# Patient Record
Sex: Male | Born: 1940
Health system: Southern US, Community
[De-identification: ages and names within clinical notes are randomized; demographics above are authoritative.]

## PROBLEM LIST (undated history)

## (undated) DIAGNOSIS — E785 Hyperlipidemia, unspecified: Secondary | ICD-10-CM

## (undated) DIAGNOSIS — K5790 Diverticulosis of intestine, part unspecified, without perforation or abscess without bleeding: Secondary | ICD-10-CM

## (undated) DIAGNOSIS — I10 Essential (primary) hypertension: Secondary | ICD-10-CM

## (undated) DIAGNOSIS — I469 Cardiac arrest, cause unspecified: Secondary | ICD-10-CM

## (undated) DIAGNOSIS — I5021 Acute systolic (congestive) heart failure: Secondary | ICD-10-CM

## (undated) DIAGNOSIS — I214 Non-ST elevation (NSTEMI) myocardial infarction: Secondary | ICD-10-CM

## (undated) DIAGNOSIS — N400 Enlarged prostate without lower urinary tract symptoms: Secondary | ICD-10-CM

## (undated) DIAGNOSIS — F419 Anxiety disorder, unspecified: Secondary | ICD-10-CM

## (undated) DIAGNOSIS — D126 Benign neoplasm of colon, unspecified: Secondary | ICD-10-CM

## (undated) HISTORY — PX: CHOLECYSTECTOMY: SHX55

---

## 2005-10-01 ENCOUNTER — Inpatient Hospital Stay: Payer: Self-pay | Admitting: Internal Medicine

## 2005-10-01 ENCOUNTER — Other Ambulatory Visit: Payer: Self-pay

## 2005-10-05 ENCOUNTER — Inpatient Hospital Stay: Payer: Self-pay | Admitting: Unknown Physician Specialty

## 2005-10-05 ENCOUNTER — Other Ambulatory Visit: Payer: Self-pay

## 2006-10-07 ENCOUNTER — Inpatient Hospital Stay: Payer: Self-pay | Admitting: Internal Medicine

## 2006-10-07 ENCOUNTER — Other Ambulatory Visit: Payer: Self-pay

## 2010-05-05 ENCOUNTER — Ambulatory Visit: Payer: Self-pay | Admitting: Family Medicine

## 2010-05-11 ENCOUNTER — Ambulatory Visit: Payer: Self-pay | Admitting: Unknown Physician Specialty

## 2010-05-23 ENCOUNTER — Ambulatory Visit: Payer: Self-pay | Admitting: Unknown Physician Specialty

## 2010-05-24 ENCOUNTER — Ambulatory Visit: Payer: Self-pay | Admitting: Unknown Physician Specialty

## 2010-05-31 ENCOUNTER — Ambulatory Visit: Payer: Self-pay | Admitting: Internal Medicine

## 2010-05-31 ENCOUNTER — Ambulatory Visit: Payer: Self-pay | Admitting: Unknown Physician Specialty

## 2010-06-29 ENCOUNTER — Ambulatory Visit: Payer: Self-pay | Admitting: Internal Medicine

## 2010-06-30 ENCOUNTER — Ambulatory Visit: Payer: Self-pay | Admitting: Unknown Physician Specialty

## 2010-07-10 ENCOUNTER — Ambulatory Visit: Payer: Self-pay | Admitting: Unknown Physician Specialty

## 2010-07-30 ENCOUNTER — Ambulatory Visit: Payer: Self-pay | Admitting: Unknown Physician Specialty

## 2010-08-29 ENCOUNTER — Ambulatory Visit: Payer: Self-pay | Admitting: Unknown Physician Specialty

## 2010-09-20 ENCOUNTER — Ambulatory Visit: Payer: Self-pay | Admitting: Internal Medicine

## 2010-09-21 ENCOUNTER — Ambulatory Visit: Payer: Self-pay | Admitting: Internal Medicine

## 2010-09-22 LAB — PSA

## 2010-09-29 ENCOUNTER — Ambulatory Visit: Payer: Self-pay | Admitting: Internal Medicine

## 2010-12-04 ENCOUNTER — Ambulatory Visit: Payer: Self-pay | Admitting: Unknown Physician Specialty

## 2010-12-06 ENCOUNTER — Inpatient Hospital Stay: Payer: Self-pay | Admitting: Internal Medicine

## 2010-12-13 LAB — PATHOLOGY REPORT

## 2015-02-16 ENCOUNTER — Observation Stay
Admission: EM | Admit: 2015-02-16 | Discharge: 2015-02-18 | Disposition: A | Discharge status: Home / Self Care | Payer: PPO | Attending: Internal Medicine | Admitting: Internal Medicine

## 2015-02-16 ENCOUNTER — Emergency Department: Payer: PPO

## 2015-02-16 DIAGNOSIS — Z79899 Other long term (current) drug therapy: Secondary | ICD-10-CM | POA: Diagnosis not present

## 2015-02-16 DIAGNOSIS — R112 Nausea with vomiting, unspecified: Secondary | ICD-10-CM | POA: Diagnosis not present

## 2015-02-16 DIAGNOSIS — H5509 Other forms of nystagmus: Secondary | ICD-10-CM | POA: Insufficient documentation

## 2015-02-16 DIAGNOSIS — Z91048 Other nonmedicinal substance allergy status: Secondary | ICD-10-CM | POA: Insufficient documentation

## 2015-02-16 DIAGNOSIS — E876 Hypokalemia: Secondary | ICD-10-CM | POA: Diagnosis not present

## 2015-02-16 DIAGNOSIS — R42 Dizziness and giddiness: Secondary | ICD-10-CM

## 2015-02-16 DIAGNOSIS — Z9049 Acquired absence of other specified parts of digestive tract: Secondary | ICD-10-CM | POA: Diagnosis not present

## 2015-02-16 DIAGNOSIS — Z8249 Family history of ischemic heart disease and other diseases of the circulatory system: Secondary | ICD-10-CM | POA: Diagnosis not present

## 2015-02-16 DIAGNOSIS — I10 Essential (primary) hypertension: Principal | ICD-10-CM | POA: Insufficient documentation

## 2015-02-16 DIAGNOSIS — H8110 Benign paroxysmal vertigo, unspecified ear: Secondary | ICD-10-CM | POA: Diagnosis present

## 2015-02-16 DIAGNOSIS — I160 Hypertensive urgency: Secondary | ICD-10-CM | POA: Diagnosis present

## 2015-02-16 DIAGNOSIS — E785 Hyperlipidemia, unspecified: Secondary | ICD-10-CM | POA: Diagnosis not present

## 2015-02-16 DIAGNOSIS — F419 Anxiety disorder, unspecified: Secondary | ICD-10-CM | POA: Insufficient documentation

## 2015-02-16 HISTORY — DX: Anxiety disorder, unspecified: F41.9

## 2015-02-16 HISTORY — DX: Hyperlipidemia, unspecified: E78.5

## 2015-02-16 HISTORY — DX: Essential (primary) hypertension: I10

## 2015-02-16 LAB — BASIC METABOLIC PANEL
Anion gap: 12 (ref 5–15)
BUN: 21 mg/dL — AB (ref 6–20)
CHLORIDE: 100 mmol/L — AB (ref 101–111)
CO2: 23 mmol/L (ref 22–32)
CREATININE: 1.23 mg/dL (ref 0.61–1.24)
Calcium: 9.5 mg/dL (ref 8.9–10.3)
GFR calc Af Amer: >60 mL/min (ref >60)
GFR calc non Af Amer: 57 mL/min — ABNORMAL LOW (ref >60)
Glucose, Bld: 224 mg/dL — ABNORMAL HIGH (ref 65–99)
Potassium: 4.8 mmol/L (ref 3.5–5.1)
Sodium: 135 mmol/L (ref 135–145)

## 2015-02-16 LAB — CBC
HCT: 49.9 % (ref 40.0–52.0)
Hemoglobin: 16.6 g/dL (ref 13.0–18.0)
MCH: 30.4 pg (ref 26.0–34.0)
MCHC: 33.3 g/dL (ref 32.0–36.0)
MCV: 91.2 fL (ref 80.0–100.0)
PLATELETS: 158 10*3/uL (ref 150–440)
RBC: 5.47 MIL/uL (ref 4.40–5.90)
RDW: 13.1 % (ref 11.5–14.5)
WBC: 11.7 10*3/uL — ABNORMAL HIGH (ref 3.8–10.6)

## 2015-02-16 LAB — URINALYSIS COMPLETE WITH MICROSCOPIC (ARMC ONLY)
BILIRUBIN URINE: NEGATIVE
GLUCOSE, UA: 150 mg/dL — AB
Hgb urine dipstick: NEGATIVE
Leukocytes, UA: NEGATIVE
Nitrite: NEGATIVE
PH: 5 (ref 5.0–8.0)
Protein, ur: NEGATIVE mg/dL
SQUAMOUS EPITHELIAL / LPF: NONE SEEN
Specific Gravity, Urine: 1.018 (ref 1.005–1.030)

## 2015-02-16 MED ORDER — MECLIZINE HCL 25 MG PO TABS
25.0000 mg | ORAL_TABLET | Freq: Once | ORAL | Status: AC
  Administered 2015-02-16: 25 mg via ORAL
  Filled 2015-02-16: qty 1

## 2015-02-16 MED ORDER — DIAZEPAM 5 MG/ML IJ SOLN: 5.0000 mg | Freq: Once | INTRAMUSCULAR | Status: DC

## 2015-02-16 MED ORDER — SODIUM CHLORIDE 0.9 % IV SOLN
INTRAVENOUS | Status: DC
  Administered 2015-02-16 – 2015-02-17 (×2): via INTRAVENOUS

## 2015-02-16 MED ORDER — PROMETHAZINE HCL 25 MG/ML IJ SOLN
12.5000 mg | Freq: Once | INTRAMUSCULAR | Status: AC
  Administered 2015-02-16: 12.5 mg via INTRAVENOUS

## 2015-02-16 MED ORDER — ACETAMINOPHEN 325 MG PO TABS: 650.0000 mg | ORAL_TABLET | Freq: Four times a day (QID) | ORAL | Status: DC | PRN

## 2015-02-16 MED ORDER — PROMETHAZINE HCL 25 MG/ML IJ SOLN
INTRAMUSCULAR | Status: AC
  Filled 2015-02-16: qty 1

## 2015-02-16 MED ORDER — IMIPRAMINE HCL 25 MG PO TABS
50.0000 mg | ORAL_TABLET | Freq: Every day | ORAL | Status: DC
  Administered 2015-02-17: 50 mg via ORAL
  Filled 2015-02-16: qty 2

## 2015-02-16 MED ORDER — ONDANSETRON HCL 4 MG PO TABS: 4.0000 mg | ORAL_TABLET | Freq: Four times a day (QID) | ORAL | Status: DC | PRN

## 2015-02-16 MED ORDER — SODIUM CHLORIDE 0.9 % IJ SOLN
3.0000 mL | Freq: Two times a day (BID) | INTRAMUSCULAR | Status: DC
  Administered 2015-02-16 – 2015-02-17 (×2): 3 mL via INTRAVENOUS

## 2015-02-16 MED ORDER — ONDANSETRON HCL 4 MG/2ML IJ SOLN: 4.0000 mg | Freq: Four times a day (QID) | INTRAMUSCULAR | Status: DC | PRN

## 2015-02-16 MED ORDER — MORPHINE SULFATE (PF) 2 MG/ML IV SOLN: 2.0000 mg | INTRAVENOUS | Status: DC | PRN

## 2015-02-16 MED ORDER — HEPARIN SODIUM (PORCINE) 5000 UNIT/ML IJ SOLN
5000.0000 [IU] | Freq: Three times a day (TID) | INTRAMUSCULAR | Status: DC
  Administered 2015-02-16 – 2015-02-18 (×5): 5000 [IU] via SUBCUTANEOUS
  Filled 2015-02-16 (×4): qty 1

## 2015-02-16 MED ORDER — DIAZEPAM 5 MG/ML IJ SOLN
2.5000 mg | Freq: Once | INTRAMUSCULAR | Status: AC
  Administered 2015-02-16: 2.5 mg via INTRAVENOUS
  Filled 2015-02-16: qty 2

## 2015-02-16 MED ORDER — OXYCODONE HCL 5 MG PO TABS: 5.0000 mg | ORAL_TABLET | ORAL | Status: DC | PRN

## 2015-02-16 MED ORDER — ONDANSETRON HCL 4 MG/2ML IJ SOLN
4.0000 mg | Freq: Once | INTRAMUSCULAR | Status: AC
  Administered 2015-02-16: 4 mg via INTRAVENOUS
  Filled 2015-02-16: qty 2

## 2015-02-16 MED ORDER — PRAVASTATIN SODIUM 20 MG PO TABS
20.0000 mg | ORAL_TABLET | Freq: Every day | ORAL | Status: DC
  Administered 2015-02-17: 20 mg via ORAL
  Filled 2015-02-16: qty 1

## 2015-02-16 MED ORDER — HYDRALAZINE HCL 20 MG/ML IJ SOLN
10.0000 mg | INTRAMUSCULAR | Status: DC | PRN
  Administered 2015-02-17: 10 mg via INTRAVENOUS
  Filled 2015-02-16: qty 1

## 2015-02-16 MED ORDER — ACETAMINOPHEN 650 MG RE SUPP: 650.0000 mg | Freq: Four times a day (QID) | RECTAL | Status: DC | PRN

## 2015-02-16 MED ORDER — CARVEDILOL 12.5 MG PO TABS
25.0000 mg | ORAL_TABLET | Freq: Two times a day (BID) | ORAL | Status: DC
  Administered 2015-02-17 – 2015-02-18 (×3): 25 mg via ORAL
  Filled 2015-02-16 (×3): qty 2

## 2015-02-16 NOTE — ED Provider Notes (Addendum)
[facility] Emergency Department Provider Note  ____________________________________________   I have reviewed the triage vital signs and the nursing notes.   HISTORY  Chief Complaint Emesis    HPI Marc [name] is a 74 y.o. male presents today complaining of vertigo symptoms. He has not had vertigo before. Started yesterday, gradually, and got worse today. A true spinning sensation associated with vomiting. He has had no fever no chills no headache no recent URI. He has no focal neurologic deficit no difficulty talking walking or seeing. He states that he has no headache. He didn't fall he did not hit his head. He believes he did take his blood pressure medication today. He states his blood pressures always up when he is around doctors. He states that he has not had this before no history of CVA but he does have a history of hypertension and high cholesterol.  Past Medical History  Diagnosis Date  . Hypertension   . Hyperlipidemia   . Anxiety     There are no active problems to display for this patient.   Past Surgical History  Procedure Laterality Date  . Cholecystectomy      No current outpatient prescriptions on file.  Allergies Review of patient's allergies indicates not on file.  No family history on file.  Social History Social History  Substance Use Topics  . Smoking status: Never Smoker   . Smokeless tobacco: None  . Alcohol Use: No    Review of Systems Constitutional: No fever/chills Eyes: No visual changes. ENT: No sore throat. No stiff neck no neck pain Cardiovascular: Denies chest pain. Respiratory: Denies shortness of breath. Gastrointestinal:   Positive vomiting.  No diarrhea.  No constipation. Genitourinary: Negative for dysuria. Musculoskeletal: Negative lower extremity swelling Skin: Negative for rash. Neurological: Negative for headaches, focal weakness or numbness. 10-point ROS otherwise  negative.  ____________________________________________   PHYSICAL EXAM:  VITAL SIGNS: ED Triage Vitals  Enc Vitals Group     BP 02/16/15 1518 220/111 mmHg     Pulse Rate 02/16/15 1518 58     Resp 02/16/15 1518 21     Temp 02/16/15 1518 97.4 F (36.3 C)     Temp src --      SpO2 02/16/15 1518 100 %     Weight 02/16/15 1518 160 lb (72.576 kg)     Height 02/16/15 1518 5\' 7"  (1.702 m)     Head Cir --      Peak Flow --      Pain Score 02/16/15 1520 6     Pain Loc --      Pain Edu? --      Excl. in [place]? --     Constitutional: Alert and oriented. Well appearing and in no acute distress. Eyes: Conjunctivae are normal. PERRL. EOMI. Head: Atraumatic. Nose: No congestion/rhinnorhea. Mouth/Throat: Mucous membranes are moist.  Oropharynx non-erythematous. TMs partially occluded by cerumen no focal lesions Neck: No stridor.   Nontender with no meningismus Cardiovascular: Normal rate, regular rhythm. Grossly normal heart sounds.  Good peripheral circulation. Respiratory: Normal respiratory effort.  No retractions. Lungs CTAB. Gastrointestinal: Soft and nontender. No distention. No guarding no rebound Back:  There is no focal tenderness or step off there is no midline tenderness there are no lesions noted. there is no CVA tenderness Musculoskeletal: No lower extremity tenderness. No joint effusions, no DVT signs strong distal pulses no edema Cranial nerves II through XII are grossly intact 5 out of 5 strength bilateral upper  and lower extremity. Finger to nose within normal limits heel to shin within normal limits, speech is normal with no word finding difficulty ordered dysarthria, reflexes symmetric pupils are equally round and reactive to light there is no pronator drift, sensation is normal, normal neurologic exam Skin:  Skin is warm, dry and intact. No rash noted. Psychiatric: Mood and affect are normal. Speech and behavior are normal.  ____________________________________________    LABS (all labs ordered are listed, but only abnormal results are displayed)  Labs Reviewed  BASIC METABOLIC PANEL - Abnormal; Notable for the following:    Chloride 100 (*)    Glucose, Bld 224 (*)    BUN 21 (*)    GFR calc non Af Amer 57 (*)    All other components within normal limits  CBC - Abnormal; Notable for the following:    WBC 11.7 (*)    All other components within normal limits  URINALYSIS COMPLETEWITH MICROSCOPIC ([facility] ONLY)   ____________________________________________  EKG  I have interpreted EKG, normal sinus bradycardia rate 56 bpm no acute ST elevation or depression aside from bradycardia unremarkable EKG  ____________________________________________  RADIOLOGY   I personally reviewed CTs_____________________________________   PROCEDURES  Procedure(s) performed: None  Critical Care performed: None  ____________________________________________   INITIAL IMPRESSION / ASSESSMENT AND PLAN / ED COURSE  Pertinent labs & imaging results that were available during my care of the patient were reviewed by me and considered in my medical decision making (see chart for details).  patient with  nystagmus true vertigo symptoms and vomiting. Otherwise neurologically intact. After Antivert and Phenergan patient states his symptoms are nearly gone. Given his age, I will obtain an MRI of the head to rule out central process, this is pending. If that is positive for CVA he will need to stay and is negative, his disposition will be based on his symptoms which at this time are much improved.   ----------------------------------------- 6:54 PM on 02/16/2015 -----------------------------------------  Patient states he has slight vertigo at this time but feels overall much much better, no nausea. MRI is negative for acute infarct. The patient has serial negative neurologic exams and we will see if he can ambulate. He feels much better. Blood pressure is 244/62 systolic at  this time and trending down as he becomes less anxious. I do not believe this represents hypertensive urgency    ____________________________________________   FINAL CLINICAL IMPRESSION(S) / ED DIAGNOSES  Final diagnoses:  Vertigo     Schuyler Amor, MD 02/16/15 1825  Schuyler Amor, MD 02/16/15 [place], MD 02/16/15 [phone]

## 2015-02-16 NOTE — ED Notes (Addendum)
Pt tolerated 8 oz. Ginger ale without vomiting. Pt attempted ambulation and became dizzy and lightheaded with Dr. Burlene Arnt present.

## 2015-02-16 NOTE — ED Notes (Signed)
EMS gave 4mg  Zofran

## 2015-02-16 NOTE — Progress Notes (Signed)
Pt arrived to unit via stretcher around 2215, pt is A&O,  unable to complete orthostatic VS on patient, patient felt "light-headed" and started to vomit. Pt was helped back in bed. Will continue to monitor. Skin is dry and warm, some redness to bilateral groins noted. Witnessed by Jonelle Sidle, Therapist, sports.

## 2015-02-16 NOTE — ED Notes (Signed)
Pt presents to ED via EMS with N&V that started yesterday. Pt states dizziness started yesterday.

## 2015-02-16 NOTE — H&P (Signed)
Marc [name] at [place] NAME: Marc [name]    MR#:  [id number]  DATE OF BIRTH:  Jul 26, 1940   DATE OF ADMISSION:  02/16/2015  PRIMARY CARE PHYSICIAN: No primary care provider on file.   REQUESTING/REFERRING PHYSICIAN: McShane  CHIEF COMPLAINT:   Chief Complaint  Patient presents with  . Emesis    HISTORY OF PRESENT ILLNESS:  Marc [name]  is a 74 y.o. male with a known history of essential hypertension. Presenting with one-day duration of vertigo symptoms. He describes approximately one day duration of feeling "swimmy headed" upon further questioning he describes this as room spinning sensation. He became nauseous as well as vomiting multiple episodes of nonbloody nonbilious emesis prompting him to present to Hospital further workup and evaluation. Upon arrival he isn't be markedly hypertensive with systolic blood pressure 712. His blood pressure has improved somewhat as his symptoms have also improved somewhat. However when 7 to him by symptoms return and he is unable to do so.  PAST MEDICAL HISTORY:   Past Medical History  Diagnosis Date  . Hypertension   . Hyperlipidemia   . Anxiety     PAST SURGICAL HISTORY:   Past Surgical History  Procedure Laterality Date  . Cholecystectomy      SOCIAL HISTORY:   Social History  Substance Use Topics  . Smoking status: Never Smoker   . Smokeless tobacco: Not on file  . Alcohol Use: No    FAMILY HISTORY:   Family History  Problem Relation Age of Onset  . Hypertension Other   . Diabetes Neg Hx     DRUG ALLERGIES:   Allergies  Allergen Reactions  . Tape Itching and Rash    REVIEW OF SYSTEMS:  REVIEW OF SYSTEMS:  CONSTITUTIONAL: Denies fevers, chills, fatigue, weakness.  EYES: Denies blurred vision, double vision, or eye pain.  EARS, NOSE, THROAT: Denies tinnitus, ear pain, hearing loss.  RESPIRATORY: denies cough, shortness of breath, wheezing  CARDIOVASCULAR: Denies  chest pain, palpitations, edema.  GASTROINTESTINAL: Positive nausea, vomiting, denies diarrhea, abdominal pain.  GENITOURINARY: Denies dysuria, hematuria.  ENDOCRINE: Denies nocturia or thyroid problems. HEMATOLOGIC AND LYMPHATIC: Denies easy bruising or bleeding.  SKIN: Denies rash or lesions.  MUSCULOSKELETAL: Denies pain in neck, back, shoulder, knees, hips, or further arthritic symptoms.  NEUROLOGIC: Denies paralysis, paresthesias.  PSYCHIATRIC: Denies anxiety or depressive symptoms. Otherwise full review of systems performed by me is negative.   MEDICATIONS AT HOME:   Prior to Admission medications   Medication Sig Start Date End Date Taking? Authorizing Provider  carvedilol (COREG) 25 MG tablet Take 25 mg by mouth 2 (two) times daily with a meal.   Yes Historical Provider, MD  imipramine (TOFRANIL) 50 MG tablet Take 50 mg by mouth at bedtime.   Yes Historical Provider, MD  lovastatin (MEVACOR) 20 MG tablet Take 20 mg by mouth every evening.   Yes Historical Provider, MD      VITAL SIGNS:  Blood pressure 178/99, pulse 80, temperature 97.4 F (36.3 C), resp. rate 17, height 5\' 7"  (1.702 m), weight 160 lb (72.576 kg), SpO2 97 %.  PHYSICAL EXAMINATION:  VITAL SIGNS: Filed Vitals:   02/16/15 2100  BP: 178/99  Pulse: 80  Temp:   Resp:    GENERAL:74 y.o.male currently in no acute distress.  HEAD: Normocephalic, atraumatic.  EYES: Pupils equal, round, reactive to light. Extraocular muscles intact. No scleral icterus.  MOUTH: Moist mucosal membrane. Dentition intact. No abscess noted.  EAR, NOSE, THROAT: Clear without exudates. No external lesions.  NECK: Supple. No thyromegaly. No nodules. No JVD.  PULMONARY: Clear to ascultation, without wheeze rails or rhonci. No use of accessory muscles, Good respiratory effort. good air entry bilaterally CHEST: Nontender to palpation.  CARDIOVASCULAR: S1 and S2. Regular rate and rhythm. No murmurs, rubs, or gallops. No edema. Pedal pulses  2+ bilaterally.  GASTROINTESTINAL: Soft, nontender, nondistended. No masses. Positive bowel sounds. No hepatosplenomegaly.  MUSCULOSKELETAL: No swelling, clubbing, or edema. Range of motion full in all extremities.  NEUROLOGIC: Cranial nerves II through XII are intact. No abnormal nystagmus strength 5/5 upper/lower extremities proximal/distal flexion/extension, pronator drift within normal limits No gross focal neurological deficits. Sensation intact. Reflexes intact.  SKIN: No ulceration, lesions, rashes, or cyanosis. Skin warm and dry. Turgor intact.  PSYCHIATRIC: Mood, affect within normal limits. The patient is awake, alert and oriented x 3. Insight, judgment intact.    LABORATORY PANEL:   CBC  Recent Labs Lab 02/16/15 1525  WBC 11.7*  HGB 16.6  HCT 49.9  PLT 158   ------------------------------------------------------------------------------------------------------------------  Chemistries   Recent Labs Lab 02/16/15 1525  NA 135  K 4.8  CL 100*  CO2 23  GLUCOSE 224*  BUN 21*  CREATININE 1.23  CALCIUM 9.5   ------------------------------------------------------------------------------------------------------------------  Cardiac Enzymes No results for input(s): TROPONINI in the last 168 hours. ------------------------------------------------------------------------------------------------------------------  RADIOLOGY:  Ct Head Wo Contrast  02/16/2015  CLINICAL DATA:  24 hour history of vertigo, dizziness, nausea and vomiting. EXAM: CT HEAD WITHOUT CONTRAST TECHNIQUE: Contiguous axial images were obtained from the base of the skull through the vertex without intravenous contrast. COMPARISON:  None. FINDINGS: Age related cerebral atrophy, ventriculomegaly and periventricular white matter disease. No extra-axial fluid collections are identified. No CT findings for acute hemispheric infarction or intracranial hemorrhage. No mass lesions. The brainstem and cerebellum are  normal. The bony structures are intact. The paranasal sinuses and mastoid air cells are clear. The globes are intact. IMPRESSION: Age related mild cerebral atrophy, ventriculomegaly and periventricular white matter disease. No acute intracranial findings or mass lesion. Electronically Signed   By: Marijo Sanes M.D.   On: 02/16/2015 15:58   Mr Brain Wo Contrast  02/16/2015  CLINICAL DATA:  New onset of vertigo. EXAM: MRI HEAD WITHOUT CONTRAST TECHNIQUE: Multiplanar, multiecho pulse sequences of the brain and surrounding structures were obtained without intravenous contrast. COMPARISON:  CT head without contrast from the same day. FINDINGS: The diffusion-weighted images demonstrate no evidence for acute or subacute infarction. Mild atrophy and white matter changes are within normal limits for age. There is some white matter change extending into the central pons and upper medulla. Dilated perivascular spaces are evident within the basal ganglia. The internal auditory canals are within normal limits. Flow is present in the major intracranial arteries. The globes and orbits are intact. The paranasal sinuses and mastoid air cells are clear. Skullbase is within normal limits. Midline structures are unremarkable. IMPRESSION: 1. No acute intracranial abnormality. 2. Mild atrophy and white matter disease is likely within normal limits for age. 3. White matter changes extending brainstem to reflects some degree of chronic microvascular ischemia. Electronically Signed   By: San Morelle M.D.   On: 02/16/2015 18:32    EKG:   Orders placed or performed during the hospital encounter of 02/16/15  . ED EKG  . ED EKG    IMPRESSION AND PLAN:   74 year old Caucasian gentleman history of essential hypertension presenting with vertigo  1. Hypertensive urgency: Restart  home medications, add when necessary hydralazine, follow blood pressure trend 2. Vertigo: Somewhat improved with improved blood pressure,  consult physical therapy for difficulty with ambulation 3. Hyperlipidemia unspecified: Statin therapy 4. Venous embolism prophylactic: Heparin subcutaneous    All the records are reviewed and case discussed with ED provider. Management plans discussed with the patient, family and they are in agreement.  CODE STATUS: Full  TOTAL TIME TAKING CARE OF THIS PATIENT: 35 minutes.    Derrell Milanes,  Karenann Cai.D on 02/16/2015 at 9:39 PM  Between 7am to 6pm - Pager - [phone]  After 6pm: House Pager: - [phone]  [facility]  Office  [phone]  CC: Primary care physician; No primary care provider on file.

## 2015-02-17 DIAGNOSIS — R42 Dizziness and giddiness: Secondary | ICD-10-CM | POA: Diagnosis not present

## 2015-02-17 LAB — GLUCOSE, CAPILLARY
Glucose-Capillary: 136 mg/dL — ABNORMAL HIGH (ref 65–99)
Glucose-Capillary: 154 mg/dL — ABNORMAL HIGH (ref 65–99)

## 2015-02-17 MED ORDER — CARBAMIDE PEROXIDE 6.5 % OT SOLN
5.0000 [drp] | Freq: Two times a day (BID) | OTIC | Status: DC
  Administered 2015-02-17 – 2015-02-18 (×3): 5 [drp] via OTIC
  Filled 2015-02-17: qty 15

## 2015-02-17 MED ORDER — INSULIN ASPART 100 UNIT/ML ~~LOC~~ SOLN
0.0000 [IU] | Freq: Three times a day (TID) | SUBCUTANEOUS | Status: DC
Start: 2015-02-17 — End: 2015-02-17

## 2015-02-17 MED ORDER — MECLIZINE HCL 25 MG PO TABS
25.0000 mg | ORAL_TABLET | Freq: Three times a day (TID) | ORAL | Status: DC
  Administered 2015-02-17 – 2015-02-18 (×4): 25 mg via ORAL
  Filled 2015-02-17 (×6): qty 1

## 2015-02-17 MED ORDER — INSULIN ASPART 100 UNIT/ML ~~LOC~~ SOLN
0.0000 [IU] | Freq: Every day | SUBCUTANEOUS | Status: DC
Start: 2015-02-17 — End: 2015-02-17

## 2015-02-17 NOTE — Progress Notes (Signed)
Patient ID: Marc [name], male   DOB: Jan 18, 1941, 74 y.o.   MRN: [id number] [facility] PROGRESS NOTE  PCP: No primary care provider on file.  HPI/Subjective: Patient still feeling very wobbly with walking. He is hesitant on going home right now. Vomiting had subsided. Still dizzy when turning his head to the right.  Objective: Filed Vitals:   02/17/15 0942  BP: 141/87  Pulse: 84  Temp:   Resp:     Filed Weights   02/16/15 1518 02/16/15 2225  Weight: 72.576 kg (160 lb) 68.629 kg (151 lb 4.8 oz)    ROS: Review of Systems  Constitutional: Positive for malaise/fatigue. Negative for fever and chills.  Eyes: Negative for blurred vision.  Respiratory: Negative for cough and shortness of breath.   Cardiovascular: Negative for chest pain.  Gastrointestinal: Negative for nausea, vomiting, abdominal pain, diarrhea and constipation.  Genitourinary: Negative for dysuria.  Musculoskeletal: Negative for joint pain.  Neurological: Positive for dizziness. Negative for headaches.   Exam: Physical Exam  Constitutional: He is oriented to person, place, and time.  HENT:  Left Ear: Tympanic membrane normal.  Nose: No mucosal edema.  Mouth/Throat: No oropharyngeal exudate or posterior oropharyngeal edema.  Wax blocking right tympanic membrane.  Eyes: Conjunctivae and lids are normal. Pupils are equal, round, and reactive to light. Right eye exhibits nystagmus. Left eye exhibits nystagmus.  Neck: No JVD present. Carotid bruit is not present. No edema present. No thyroid mass and no thyromegaly present.  Cardiovascular: S1 normal and S2 normal.  Exam reveals no gallop.   No murmur heard. Pulses:      Dorsalis pedis pulses are 2+ on the right side, and 2+ on the left side.  Respiratory: No respiratory distress. He has no wheezes. He has no rhonchi. He has no rales.  GI: Soft. Bowel sounds are normal. There is no tenderness.  Musculoskeletal:       Right ankle: He exhibits no  swelling.       Left ankle: He exhibits no swelling.  Lymphadenopathy:    He has no cervical adenopathy.  Neurological: He is alert and oriented to person, place, and time. No cranial nerve deficit.  As per physical therapy loses balance when turning to the right. Power 5 out of 5 upper and lower extremities. Finger-nose and heel shin intact bilaterally.  Skin: Skin is warm. No rash noted. Nails show no clubbing.  Psychiatric: He has a normal mood and affect.    Data Reviewed: Basic Metabolic Panel:  Recent Labs Lab 02/16/15 1525  NA 135  K 4.8  CL 100*  CO2 23  GLUCOSE 224*  BUN 21*  CREATININE 1.23  CALCIUM 9.5   CBC:  Recent Labs Lab 02/16/15 1525  WBC 11.7*  HGB 16.6  HCT 49.9  MCV 91.2  PLT 158    Studies: Ct Head Wo Contrast  02/16/2015  CLINICAL DATA:  24 hour history of vertigo, dizziness, nausea and vomiting. EXAM: CT HEAD WITHOUT CONTRAST TECHNIQUE: Contiguous axial images were obtained from the base of the skull through the vertex without intravenous contrast. COMPARISON:  None. FINDINGS: Age related cerebral atrophy, ventriculomegaly and periventricular white matter disease. No extra-axial fluid collections are identified. No CT findings for acute hemispheric infarction or intracranial hemorrhage. No mass lesions. The brainstem and cerebellum are normal. The bony structures are intact. The paranasal sinuses and mastoid air cells are clear. The globes are intact. IMPRESSION: Age related mild cerebral atrophy, ventriculomegaly and periventricular white matter disease.  No acute intracranial findings or mass lesion. Electronically Signed   By: Marijo Sanes M.D.   On: 02/16/2015 15:58   Mr Brain Wo Contrast  02/16/2015  CLINICAL DATA:  New onset of vertigo. EXAM: MRI HEAD WITHOUT CONTRAST TECHNIQUE: Multiplanar, multiecho pulse sequences of the brain and surrounding structures were obtained without intravenous contrast. COMPARISON:  CT head without contrast from  the same day. FINDINGS: The diffusion-weighted images demonstrate no evidence for acute or subacute infarction. Mild atrophy and white matter changes are within normal limits for age. There is some white matter change extending into the central pons and upper medulla. Dilated perivascular spaces are evident within the basal ganglia. The internal auditory canals are within normal limits. Flow is present in the major intracranial arteries. The globes and orbits are intact. The paranasal sinuses and mastoid air cells are clear. Skullbase is within normal limits. Midline structures are unremarkable. IMPRESSION: 1. No acute intracranial abnormality. 2. Mild atrophy and white matter disease is likely within normal limits for age. 3. White matter changes extending brainstem to reflects some degree of chronic microvascular ischemia. Electronically Signed   By: San Morelle M.D.   On: 02/16/2015 18:32    Scheduled Meds: . carbamide peroxide  5 drop Right Ear BID  . carvedilol  25 mg Oral BID WC  . heparin  5,000 Units Subcutaneous 3 times per day  . imipramine  50 mg Oral QHS  . meclizine  25 mg Oral TID  . pravastatin  20 mg Oral q1800  . sodium chloride  3 mL Intravenous Q12H   Continuous Infusions: . sodium chloride 75 mL/hr at 02/16/15 2252    Assessment/Plan:  1. Acute vertigo with horizontal nystagmus, nausea vomiting. MRI of the brain without contrast negative for acute stroke but does show chronic white matter microvascular ischemia. Trial of meclizine. Reevaluation later today and potential tomorrow morning. Continue IV fluid hydration. Likely will need ENT evaluation as outpatient. I will page and neurology. 2. Accelerated hypertension on presentation- blood pressure better now. 3. Hyperlipidemia unspecified continue pravastatin 4. History of anxiety- on imipramine  Code Status:     Code Status Orders        Start     Ordered   02/16/15 2037  Full code   Continuous      02/16/15 2037    Advance Directive Documentation        Most Recent Value   Type of Advance Directive  Healthcare Power of Attorney   Pre-existing out of facility DNR order (yellow form or pink MOST form)     "MOST" Form in Place?       Family Communication: Wife at bedside Disposition Plan: Home either later today versus tomorrow.  Time spent: 25 minutes now  [facility]

## 2015-02-17 NOTE — Evaluation (Signed)
Physical Therapy Evaluation Patient Details Name: Marc [name] MRN: [id number] DOB: November 18, 1940 Today's Date: 02/17/2015   History of Present Illness  Pt is a 74 y.o. male presenting to hospital with 1 day h/o vertigo symptom's and also with emesis.  Pt admitted with hypertensive urgency.  CT of head and MRI of brain negative for any acute intracranial abnormality.  PMH includes essential htn and anxiety.  Clinical Impression  Currently pt demonstrates impairments with balance and limitations with functional mobility d/t this.  Prior to admission, pt was independent without AD.  Pt lives with his wife in 1 level home with 5 STE with B railing.  Currently pt is min to mod assist to steady with ambulation without AD d/t balance impairments; pt CGA to SBA with use of RW.  Pt reporting feeling "swimmy headed" with eye movements to the R; R beating pure horizontal nystagmus also noted which worsens with R gaze, diminishes at central gaze and is absent with L gaze. No change with visual fixation in nystagmus noted at central gaze. Negative pronator drift, negative heel to shin, rapid alternating movements intact and ocular ROM is full. No focal weakness noted during exam. Results of examination discussed with Dr. Leslye Peer concerning central vs peripheral differential for symptoms and need for outpatient ENT workup if he is ruled out for CVA. Examination and MD discussion performed in conjunction with second therapist, Roxana Hires PT, DPT. Pt would benefit from skilled PT to address above noted impairments and functional limitations.  Recommend pt discharge to home with SBA for all functional mobility with use of RW when medically appropriate; also recommend OP PT.     Follow Up Recommendations  (OP PT (vestibular vs for balance pending further work-up))    Equipment Recommendations  Rolling walker with 5" wheels    Recommendations for Other Services       Precautions / Restrictions  Precautions Precautions: Fall Restrictions Weight Bearing Restrictions: No      Mobility  Bed Mobility Overal bed mobility: Modified Independent             General bed mobility comments: Supine to sit with HOB elevated  Transfers Overall transfer level: Needs assistance Equipment used: None;Rolling walker (2 wheeled) Transfers: Sit to/from [facility] Sit to Stand: Supervision (SBA with and without AD) Stand pivot transfers:  (stand step turn no AD CGA to min assist to steady for toilet transfer; stand step turn with RW SBA)       General transfer comment: pt required initial vc's for how to safely use RW with transfers  Ambulation/Gait Ambulation/Gait assistance: Min guard;Min assist;Mod assist (pt min-mod assist no AD; CGA to SBA with RW) Ambulation Distance (Feet):  (80 feet no AD; 200 feet with RW) Assistive device: None;Rolling walker (2 wheeled)   Gait velocity: mild decrease   General Gait Details: pt unsteady without AD (min assist to steady) but required mod assist 1x d/t loss of balance to R; pt initially CGA with RW but with distance and practice and initial vc's for use of RW pt SBA  Stairs            Wheelchair Mobility    Modified Rankin (Stroke Patients Only)       Balance Overall balance assessment: Needs assistance Sitting-balance support: No upper extremity supported;Feet supported Sitting balance-Leahy Scale: Good     Standing balance support: Bilateral upper extremity supported (on RW) Standing balance-Leahy Scale: Good  Pertinent Vitals/Pain Pain Assessment: No/denies pain  Vitals stable and WFL throughout treatment session.    Home Living Family/patient expects to be discharged to:: Private residence Living Arrangements: Spouse/significant other Available Help at Discharge: Family Type of Home: House Home Access: Stairs to enter Entrance Stairs-Rails: Right;Left;Can  reach both Technical brewer of Steps: [place]: One level Home Equipment: None      Prior Function Level of Independence: Independent         Comments: Retired; walks 3 miles each day     Journalist, newspaper        Extremity/Trunk Assessment   Upper Extremity Assessment: Overall WFL for tasks assessed           Lower Extremity Assessment: Overall WFL for tasks assessed      Cervical / Trunk Assessment: Normal  Communication   Communication: No difficulties  Cognition Arousal/Alertness: Awake/alert Behavior During Therapy: WFL for tasks assessed/performed Overall Cognitive Status: Within Functional Limits for tasks assessed                      General Comments General comments (skin integrity, edema, etc.): pt c/o feeling "swimmy headed" with looking to R with his eyes; R beating nystagmus noted  Nursing cleared pt for participation in physical therapy.  Pt agreeable to PT session.    Exercises        Assessment/Plan    PT Assessment Patient needs continued PT services  PT Diagnosis Difficulty walking   PT Problem List Decreased balance  PT Treatment Interventions DME instruction;Gait training;Stair training;Functional mobility training;Therapeutic activities;Therapeutic exercise;Balance training;Neuromuscular re-education;Patient/family education   PT Goals (Current goals can be found in the Care Plan section) Acute Rehab PT Goals Patient Stated Goal: to improve balance PT Goal Formulation: With patient Time For Goal Achievement: 03/03/15 Potential to Achieve Goals: Good    Frequency Min 2X/week   Barriers to discharge        Co-evaluation               End of Session Equipment Utilized During Treatment: Gait belt Activity Tolerance: Patient tolerated treatment well Patient left: in chair;with call bell/phone within reach;with chair alarm set;with family/visitor present Nurse Communication: Mobility status;Precautions          Time: 7494-4967 PT Time Calculation (min) (ACUTE ONLY): 57 min   Charges:   PT Evaluation $Initial PT Evaluation Tier I: 1 Procedure PT Treatments $Gait Training: 8-22 mins   PT G CodesLeitha Bleak 2015/03/15, 11:17 AM Leitha Bleak, [place]

## 2015-02-17 NOTE — Progress Notes (Signed)
Inpatient Diabetes Program Recommendations  AACE/ADA: New Consensus Statement on Inpatient Glycemic Control (2015)  Target Ranges:  Prepandial:   less than 140 mg/dL      Peak postprandial:   less than 180 mg/dL (1-2 hours)      Critically ill patients:  140 - 180 mg/dL   Review of Glycemic ControlResults for Marc, [name] (MRN [id number]) as of 02/17/2015 09:51  Ref. Range 02/16/2015 15:25  Glucose Latest Ref Range: 65-99 mg/dL 224 (H)    Note that blood glucose elevated.  Please consider checking A1C to determine pre-hospitalization glycemic control.  Also consider ordering CBG's tid with meals and HS.  If greater than 140 mg/dL, consider adding Novolog correction.    Thanks, Adah Perl, RN, BC-ADM Inpatient Diabetes Coordinator Pager [phone] (8a-5p)

## 2015-02-17 NOTE — Care Management (Signed)
Patient will require front wheeled rolling walker.  No agency preference.  Per physical therapy, there are no further physical therapy needs.

## 2015-02-17 NOTE — Care Management (Signed)
Admitted under observation for hypertension.  Appear is followed by [facility] registry.  Notified [facility]

## 2015-02-17 NOTE — Progress Notes (Signed)
Patient alert and oriented x4, no complaints at this time. vss at this time, BP WDL. Patient NSR on telemetry. Will continue to assess. Patient ambulating with walker, sitting up in chair at this time. Wilnette Kales

## 2015-02-17 NOTE — Consult Note (Signed)
Neurology:  74 y/o male admitted with hypertensive urgency and vertigo.  Vertigo is positional worse when looking to the right side. Imaging reviewed no acute abnormalities.     Pt does have vertigo when looking to the R side along with nystagmus.  Now feel better post meclizine.     Neurological Examination Mental Status: Alert, oriented, thought content appropriate.  Speech fluent without evidence of aphasia.  Able to follow 3 step commands without difficulty. Cranial Nerves: II: Discs flat bilaterally; Visual fields grossly normal, pupils equal, round, reactive to light and accommodation III,IV, VI: ptosis not present, extra-ocular motions intact bilaterally V,VII: smile symmetric, facial light touch sensation normal bilaterally VIII: hearing normal bilaterally IX,X: gag reflex present XI: bilateral shoulder shrug XII: midline tongue extension Motor: Right : Upper extremity   5/5    Left:     Upper extremity   5/5  Lower extremity   5/5     Lower extremity   5/5 Tone and bulk:normal tone throughout; no atrophy noted Sensory: Pinprick and light touch intact throughout, bilaterally Deep Tendon Reflexes: 1+ and symmetric throughout Plantars: Right: downgoing   Left: downgoing Cerebellar: normal finger-to-nose, normal rapid alternating movements and normal heel-to-shin test Gait: able to stand with assistance.    74 y/o male admitted with hypertensive urgency and vertigo.  Vertigo is positional worse when looking to the right side. Imaging reviewed no acute abnormalities.     Pt does have vertigo when looking to the R side along with nystagmus.  Now feel better post meclizine.   This likely peripheral process from R ear as symptoms improve.   Observer overnight and d/c tomorrow D/C with PRN meclizine If needed out pt PT for epley's maneuver.  Leotis Pain

## 2015-02-18 LAB — BASIC METABOLIC PANEL
ANION GAP: 7 (ref 5–15)
BUN: 13 mg/dL (ref 6–20)
CHLORIDE: 106 mmol/L (ref 101–111)
CO2: 27 mmol/L (ref 22–32)
CREATININE: 1.07 mg/dL (ref 0.61–1.24)
Calcium: 8.3 mg/dL — ABNORMAL LOW (ref 8.9–10.3)
GFR calc non Af Amer: >60 mL/min (ref >60)
Glucose, Bld: 128 mg/dL — ABNORMAL HIGH (ref 65–99)
POTASSIUM: 3.3 mmol/L — AB (ref 3.5–5.1)
SODIUM: 140 mmol/L (ref 135–145)

## 2015-02-18 LAB — HEMOGLOBIN A1C: HEMOGLOBIN A1C: 7.1 % — AB (ref 4.0–6.0)

## 2015-02-18 MED ORDER — CARBAMIDE PEROXIDE 6.5 % OT SOLN: 5.0000 [drp] | Freq: Two times a day (BID) | OTIC | Status: DC

## 2015-02-18 MED ORDER — AMLODIPINE BESYLATE 5 MG PO TABS
5.0000 mg | ORAL_TABLET | Freq: Once | ORAL | Status: AC
  Administered 2015-02-18: 5 mg via ORAL

## 2015-02-18 MED ORDER — MECLIZINE HCL 25 MG PO TABS: 25.0000 mg | ORAL_TABLET | Freq: Three times a day (TID) | ORAL | Status: DC

## 2015-02-18 MED ORDER — POTASSIUM CHLORIDE CRYS ER 20 MEQ PO TBCR
40.0000 meq | EXTENDED_RELEASE_TABLET | Freq: Once | ORAL | Status: AC
  Administered 2015-02-18: 40 meq via ORAL
  Filled 2015-02-18: qty 2

## 2015-02-18 MED ORDER — AMLODIPINE BESYLATE 5 MG PO TABS: 5.0000 mg | ORAL_TABLET | Freq: Every day | ORAL | Status: DC

## 2015-02-18 MED ORDER — AMLODIPINE BESYLATE 5 MG PO TABS
5.0000 mg | ORAL_TABLET | Freq: Every day | ORAL | Status: DC
  Administered 2015-02-18: 5 mg via ORAL
  Filled 2015-02-18 (×2): qty 1

## 2015-02-18 NOTE — Progress Notes (Addendum)
Patient d/c'd home, BP within normal range for d/c. Education provided, no questions at this time. Patient to be picked up by wife. Telemetry removed. Wilnette Kales

## 2015-02-18 NOTE — Consult Note (Signed)
   [facility] CM Inpatient Consult   02/18/2015  Marc [name] Apr 19, 1941 [id number]   EPIC [facility] Care Management referral received. Called into [name]'s room to explain and discuss [facility] Care Management. He is agreeable. Verbal consent obtained. Confirmed best contact number for him as [phone]. Explained that he will receive post hospital transition of care calls and will be evaluated for monthly home visits. Encouraged him to make a Primary Care MD appointment for close follow up. He is agreeable to this as well. Will request for him to be assigned to [place]. Will make inpatient RNCM aware.  Marthenia Rolling, MSN-Ed, RN,BSN [facility] Liaison [phone]

## 2015-02-18 NOTE — Progress Notes (Signed)
Dr. Leslye Peer notified of patient BP being elevated. New BP med ordered and given to patient will recheck BP. If BP normal, still okay to d/c. Patient NSR on telemetry, no complaints at this time. Alert and oriented x4. Marc [name]

## 2015-02-18 NOTE — Progress Notes (Signed)
Physical Therapy Treatment Patient Details Name: Marc [name] MRN: [id number] DOB: Jun 21, 1940 Today's Date: 02/18/2015    History of Present Illness Pt is a 74 y.o. male presenting to hospital with 1 day h/o vertigo symptom's and also with emesis.  Pt admitted with hypertensive urgency.  CT of head and MRI of brain negative for any acute intracranial abnormality.  PMH includes essential htn and anxiety.    PT Comments    Pt appears steady with RW use (pt c/o intermittent "swimminess" in head still).  Pt educated on safe RW use and how to safely adjust RW for his height (d/t his RW had not been delivered yet to room: CM aware).  Pt appears safe to discharge home with support of his wife and SBA for functional mobility with RW for safety.   Follow Up Recommendations  Supervision for mobility/OOB (OP PT (vestibular vs for balance pending further work-up))     Equipment Recommendations  Rolling walker with 5" wheels    Recommendations for Other Services      Precautions / Restrictions Precautions Precautions: Fall Restrictions Weight Bearing Restrictions: No    Mobility  Bed Mobility               General bed mobility comments: Not assessed (pt sitting up in chair already)  Transfers Overall transfer level: Modified independent Equipment used: Rolling walker (2 wheeled) Transfers: Sit to/from [facility] Sit to Stand: Modified independent (Device/Increase time) Stand pivot transfers: Modified independent (Device/Increase time) (transfer to toilet)       General transfer comment: steady and safe with RW use  Ambulation/Gait Ambulation/Gait assistance: Supervision Ambulation Distance (Feet): 280 Feet Assistive device: Rolling walker (2 wheeled) Gait Pattern/deviations: WFL(Within Functional Limits)   Gait velocity interpretation: at or above normal speed for age/gender General Gait Details: pt with occasional c/o "swimminess" in head but no loss of  balance with RW   Stairs Stairs: Yes Stairs assistance: Supervision Stair Management: One rail Right Number of Stairs: 4 General stair comments: alternating steps; steady with railing use  Wheelchair Mobility    Modified Rankin (Stroke Patients Only)       Balance Overall balance assessment: Needs assistance Sitting-balance support: No upper extremity supported;Feet supported Sitting balance-Leahy Scale: Normal     Standing balance support: Bilateral upper extremity supported (use of RW) Standing balance-Leahy Scale: Good                      Cognition Arousal/Alertness: Awake/alert Behavior During Therapy: WFL for tasks assessed/performed Overall Cognitive Status: Within Functional Limits for tasks assessed                      Exercises  Pt and pt's wife educated on safe walker use and adjusting walker appropriately to pt's height; also answered pt and pt's wife's questions on assist levels and discharge.    General Comments   Nursing cleared pt for participation in physical therapy.  Pt agreeable to PT session.  Pt's wife present during session.      Pertinent Vitals/Pain Pain Assessment: No/denies pain  Vitals stable and WFL throughout treatment session.    Home Living                      Prior Function            PT Goals (current goals can now be found in the care plan section) Acute Rehab PT Goals Patient Stated  Goal: to improve balance PT Goal Formulation: With patient Time For Goal Achievement: 03/03/15 Potential to Achieve Goals: Good Progress towards PT goals: Progressing toward goals    Frequency  Min 2X/week    PT Plan Current plan remains appropriate    Co-evaluation             End of Session Equipment Utilized During Treatment: Gait belt Activity Tolerance: Patient tolerated treatment well Patient left: in chair;with call bell/phone within reach;with chair alarm set;with family/visitor present      Time: 0902-0925 PT Time Calculation (min) (ACUTE ONLY): 23 min  Charges:  $Gait Training: 23-37 mins                    G CodesLeitha Bleak March 04, 2015, 11:43 AM Leitha Bleak, PT [phone]

## 2015-02-18 NOTE — Discharge Instructions (Signed)
Benign Positional Vertigo Vertigo is the feeling that you or your surroundings are moving when they are not. Benign positional vertigo is the most common form of vertigo. The cause of this condition is not serious (is benign). This condition is triggered by certain movements and positions (is positional). This condition can be dangerous if it occurs while you are doing something that could endanger you or others, such as driving.  CAUSES In many cases, the cause of this condition is not known. It may be caused by a disturbance in an area of the inner ear that helps your brain to sense movement and balance. This disturbance can be caused by a viral infection (labyrinthitis), head injury, or repetitive motion. RISK FACTORS This condition is more likely to develop in:  Women.  People who are 50 years of age or older. SYMPTOMS Symptoms of this condition usually happen when you move your head or your eyes in different directions. Symptoms may start suddenly, and they usually last for less than a minute. Symptoms may include:  Loss of balance and falling.  Feeling like you are spinning or moving.  Feeling like your surroundings are spinning or moving.  Nausea and vomiting.  Blurred vision.  Dizziness.  Involuntary eye movement (nystagmus). Symptoms can be mild and cause only slight annoyance, or they can be severe and interfere with daily life. Episodes of benign positional vertigo may return (recur) over time, and they may be triggered by certain movements. Symptoms may improve over time. DIAGNOSIS This condition is usually diagnosed by medical history and a physical exam of the head, neck, and ears. You may be referred to a health care provider who specializes in ear, nose, and throat (ENT) problems (otolaryngologist) or a provider who specializes in disorders of the nervous system (neurologist). You may have additional testing, including:  MRI.  A CT scan.  Eye movement tests. Your  health care provider may ask you to change positions quickly while he or she watches you for symptoms of benign positional vertigo, such as nystagmus. Eye movement may be tested with an electronystagmogram (ENG), caloric stimulation, the Dix-Hallpike test, or the roll test.  An electroencephalogram (EEG). This records electrical activity in your brain.  Hearing tests. TREATMENT Usually, your health care provider will treat this by moving your head in specific positions to adjust your inner ear back to normal. Surgery may be needed in severe cases, but this is rare. In some cases, benign positional vertigo may resolve on its own in 2-4 weeks. HOME CARE INSTRUCTIONS Safety  Move slowly.Avoid sudden body or head movements.  Avoid driving.  Avoid operating heavy machinery.  Avoid doing any tasks that would be dangerous to you or others if a vertigo episode would occur.  If you have trouble walking or keeping your balance, try using a cane for stability. If you feel dizzy or unstable, sit down right away.  Return to your normal activities as told by your health care provider. Ask your health care provider what activities are safe for you. General Instructions  Take over-the-counter and prescription medicines only as told by your health care provider.  Avoid certain positions or movements as told by your health care provider.  Drink enough fluid to keep your urine clear or pale yellow.  Keep all follow-up visits as told by your health care provider. This is important. SEEK MEDICAL CARE IF:  You have a fever.  Your condition gets worse or you develop new symptoms.  Your family or friends   notice any behavioral changes.  Your nausea or vomiting gets worse.  You have numbness or a "pins and needles" sensation. SEEK IMMEDIATE MEDICAL CARE IF:  You have difficulty speaking or moving.  You are always dizzy.  You faint.  You develop severe headaches.  You have weakness in your  legs or arms.  You have changes in your hearing or vision.  You develop a stiff neck.  You develop sensitivity to light.   This information is not intended to replace advice given to you by your health care provider. Make sure you discuss any questions you have with your health care provider.   Document Released: 01/22/2006 Document Revised: 01/05/2015 Document Reviewed: 08/09/2014 Elsevier Interactive Patient Education 2016 [employer]  

## 2015-02-18 NOTE — Care Management (Signed)
Obtained order for outpatient physical therapy for vestibular therapy and hand carried it to outpatient rehab department as attending unable to entering it in [place].  Obtained front wheeled rolling walker for patient from [facility] as agency is in network with patient's insurance plan.

## 2015-02-18 NOTE — Discharge Summary (Addendum)
[facility] NAME: Jasper Hanf    MR#:  [id number]  DATE OF BIRTH:  July 08, 1940  DATE OF ADMISSION:  02/16/2015 ADMITTING PHYSICIAN: Lytle Butte, MD  DATE OF DISCHARGE: 02/18/2015  PRIMARY CARE PHYSICIAN: No primary care provider on file.    ADMISSION DIAGNOSIS:  Vertigo [R42] Vertigo, benign paroxysmal, unspecified laterality [H81.10]  DISCHARGE DIAGNOSIS:  Active Problems:   Hypertensive urgency   Vertigo   SECONDARY DIAGNOSIS:   Past Medical History  Diagnosis Date  . Hypertension   . Hyperlipidemia   . Anxiety     HOSPITAL COURSE:   1. Vertigo- likely benign positional vertigo. Physical therapy recommended outpatient vestibular training. We'll follow up with ENT as outpatient. Seen by neurology here in the hospital. Dizziness is less and improved since when he came in. Patient was able to walk for me and stand up. When necessary meclizine 2. Essential hypertension continue Coreg, and low-dose Norvasc. 3. Hyperlipidemia unspecified- continue statin 4. Anxiety on imipramine 5. Hypokalemia replace oral potassium today  DISCHARGE CONDITIONS:   Satisfactory  CONSULTS OBTAINED:  Treatment Team:  Lytle Butte, MD Leotis Pain, MD  DRUG ALLERGIES:   Allergies  Allergen Reactions  . Tape Itching and Rash    DISCHARGE MEDICATIONS:   Current Discharge Medication List    START taking these medications   Details  amLODipine (NORVASC) 5 MG tablet Take 1 tablet (5 mg total) by mouth daily. Qty: 30 tablet, Refills: 0    carbamide peroxide (DEBROX) 6.5 % otic solution Place 5 drops into the right ear 2 (two) times daily. Qty: 15 mL, Refills: 0    meclizine (ANTIVERT) 25 MG tablet Take 1 tablet (25 mg total) by mouth 3 (three) times daily. Qty: 30 tablet, Refills: 0      CONTINUE these medications which have NOT CHANGED   Details  carvedilol (COREG) 25 MG tablet Take 25 mg by mouth 2 (two)  times daily with a meal.    imipramine (TOFRANIL) 50 MG tablet Take 50 mg by mouth at bedtime.    lovastatin (MEVACOR) 20 MG tablet Take 20 mg by mouth every evening.         DISCHARGE INSTRUCTIONS:   Follow-up your medical doctor 2 weeks Follow-up ENT 1 week  If you experience worsening of your admission symptoms, develop shortness of breath, life threatening emergency, suicidal or homicidal thoughts you must seek medical attention immediately by calling 911 or calling your MD immediately  if symptoms less severe.  You Must read complete instructions/literature along with all the possible adverse reactions/side effects for all the Medicines you take and that have been prescribed to you. Take any new Medicines after you have completely understood and accept all the possible adverse reactions/side effects.   Please note  You were cared for by a hospitalist during your hospital stay. If you have any questions about your discharge medications or the care you received while you were in the hospital after you are discharged, you can call the unit and asked to speak with the hospitalist on call if the hospitalist that took care of you is not available. Once you are discharged, your primary care physician will handle any further medical issues. Please note that NO REFILLS for any discharge medications will be authorized once you are discharged, as it is imperative that you return to your primary care physician (or establish a relationship with a primary care physician if you do not  have one) for your aftercare needs so that they can reassess your need for medications and monitor your lab values.    Today   CHIEF COMPLAINT:   Chief Complaint  Patient presents with  . Emesis    HISTORY OF PRESENT ILLNESS:  Herbert Marken  is a 74 y.o. male with a known history of hypertension presented with nausea vomiting and vertigo.   VITAL SIGNS:  Blood pressure 158/79, pulse 84, temperature 97.8 F  (36.6 C), temperature source Oral, resp. rate 18, height 5\' 7"  (1.702 m), weight 68.629 kg (151 lb 4.8 oz), SpO2 95 %.  I/O:    Intake/Output Summary (Last 24 hours) at 02/18/15 0834 Last data filed at 02/18/15 0820  Gross per 24 hour  Intake   2035 ml  Output   2300 ml  Net   -265 ml    PHYSICAL EXAMINATION:  GENERAL:  73 y.o.-year-old patient lying in the bed with no acute distress.  EYES: Pupils equal, round, reactive to light and accommodation. No scleral icterus. Extraocular muscles intact.  HEENT: Head atraumatic, normocephalic. Oropharynx and nasopharynx clear.  NECK:  Supple, no jugular venous distention. No thyroid enlargement, no tenderness.  LUNGS: Normal breath sounds bilaterally, no wheezing, rales,rhonchi or crepitation. No use of accessory muscles of respiration.  CARDIOVASCULAR: S1, S2 normal. No murmurs, rubs, or gallops.  ABDOMEN: Soft, non-tender, non-distended. Bowel sounds present. No organomegaly or mass.  EXTREMITIES: No pedal edema, cyanosis, or clubbing.  NEUROLOGIC: Cranial nerves II through XII are intact. Muscle strength 5/5 in all extremities. Sensation intact. Gait wide base. Romberg negative PSYCHIATRIC: The patient is alert and oriented x 3.  SKIN: No obvious rash, lesion, or ulcer.   DATA REVIEW:   CBC  Recent Labs Lab 02/16/15 1525  WBC 11.7*  HGB 16.6  HCT 49.9  PLT 158    Chemistries   Recent Labs Lab 02/18/15 0447  NA 140  K 3.3*  CL 106  CO2 27  GLUCOSE 128*  BUN 13  CREATININE 1.07  CALCIUM 8.3*      RADIOLOGY:  Ct Head Wo Contrast  02/16/2015  CLINICAL DATA:  24 hour history of vertigo, dizziness, nausea and vomiting. EXAM: CT HEAD WITHOUT CONTRAST TECHNIQUE: Contiguous axial images were obtained from the base of the skull through the vertex without intravenous contrast. COMPARISON:  None. FINDINGS: Age related cerebral atrophy, ventriculomegaly and periventricular white matter disease. No extra-axial fluid  collections are identified. No CT findings for acute hemispheric infarction or intracranial hemorrhage. No mass lesions. The brainstem and cerebellum are normal. The bony structures are intact. The paranasal sinuses and mastoid air cells are clear. The globes are intact. IMPRESSION: Age related mild cerebral atrophy, ventriculomegaly and periventricular white matter disease. No acute intracranial findings or mass lesion. Electronically Signed   By: Marijo Sanes M.D.   On: 02/16/2015 15:58   Mr Brain Wo Contrast  02/16/2015  CLINICAL DATA:  New onset of vertigo. EXAM: MRI HEAD WITHOUT CONTRAST TECHNIQUE: Multiplanar, multiecho pulse sequences of the brain and surrounding structures were obtained without intravenous contrast. COMPARISON:  CT head without contrast from the same day. FINDINGS: The diffusion-weighted images demonstrate no evidence for acute or subacute infarction. Mild atrophy and white matter changes are within normal limits for age. There is some white matter change extending into the central pons and upper medulla. Dilated perivascular spaces are evident within the basal ganglia. The internal auditory canals are within normal limits. Flow is present in the major intracranial arteries.  The globes and orbits are intact. The paranasal sinuses and mastoid air cells are clear. Skullbase is within normal limits. Midline structures are unremarkable. IMPRESSION: 1. No acute intracranial abnormality. 2. Mild atrophy and white matter disease is likely within normal limits for age. 3. White matter changes extending brainstem to reflects some degree of chronic microvascular ischemia. Electronically Signed   By: San Morelle M.D.   On: 02/16/2015 18:32     Management plans discussed with the patient, and he is in agreement.  CODE STATUS:     Code Status Orders        Start     Ordered   02/16/15 2037  Full code   Continuous     02/16/15 2037    Advance Directive Documentation         Most Recent Value   Type of Advance Directive  Healthcare Power of Attorney   Pre-existing out of facility DNR order (yellow form or pink MOST form)     "MOST" Form in Place?        TOTAL TIME TAKING CARE OF THIS PATIENT: 35 minutes.    Loletha Grayer M.D on 02/18/2015 at 8:34 AM  Between 7am to 6pm - Pager - [phone]  After 6pm go to [email or web address] - password [email or web address]  [facility]  Office  [phone]  CC: Primary care physician; No primary care provider on file.

## 2015-02-18 NOTE — Patient Outreach (Signed)
[facility]) Care Management  02/18/2015  Marc [name] [birth year] [id number]   Referral from Marthenia Rolling, RN to assign [employer], assigned Merlene Morse Minor, Therapist, sports.  Thanks, Ronnell Freshwater. [place], [place] Assistant Phone: [phone] Fax: [phone]

## 2015-02-22 ENCOUNTER — Other Ambulatory Visit: Payer: Self-pay | Admitting: *Deleted

## 2015-02-22 NOTE — Patient Outreach (Signed)
RNCM made first transition of care call. Spoke with pt's spouse Adolpho Meenach. She stated pt was napping at this time. Spouse stated pt was still having dizziness, but has not had any falls. Spouse offered pt has a follow up appointment to see his primary care MD on Thursday and an ENT on Thursday. RNCM asked spouse if pt felt like calling when he woke to please have pt return RNCM's call.    Plan: RNCM will await pt's return call.  RNCM will call pt again tomorrow if no return call received.  Rutherford Limerick RN, BSN  [facility] Care Management [phone])

## 2015-02-22 NOTE — Patient Outreach (Signed)
RNCM returned pt call to complete transition of care call #1. Pt's identification verified. Pt stating he was feeling better but was still experiencing some dizziness. Pt stating he was not having any nausea or vomiting. Pt stated he had made follow up appointments with PCP and ENT on Thursday. RNCM talked with pt about fall safety, using his walker, taking up throw rugs, clearing pathways, and standing and moving slowly. Pt reports his wife will be driving him to his appointments. Pt denies affordability issues with home medications. No HH or HHPT was ordered at this time, but pt stated after f/u appointments he may be ordered outpatient PT. RNCM verified pt had call back number if pt had f/u questions. RNCM made pt aware she would be calling him next week to f/u with the transition of care program.  .  Plan: RNCM will make TOC call # 2 next week.  RNCM will send pt welcome letter.  RNCM will make MD aware of [facility] involvement.  Rutherford Limerick RN, BSN  [facility] Care Management [phone])

## 2015-03-01 ENCOUNTER — Other Ambulatory Visit: Payer: Self-pay | Admitting: *Deleted

## 2015-03-01 ENCOUNTER — Ambulatory Visit: Payer: PPO | Admitting: *Deleted

## 2015-03-01 NOTE — Patient Outreach (Signed)
RNCM called tp and spouse answered saying pt unavailable. RNCM requested pt call back tomorrow around 4pm and spouse stated she would give pt message.  Plan: TOC appt call rescheduled for tomorrow at [place], BSN  [facility] Care Management [phone])

## 2015-03-02 ENCOUNTER — Other Ambulatory Visit: Payer: Self-pay | Admitting: *Deleted

## 2015-03-02 NOTE — Patient Outreach (Signed)
2nd transition of care call made. Pt stated he had followed up with ENT and PCP since hospitalization. Pt was open to a home visit. Home visit scheduled. Pt stated he was feeling well and each day was better and better. Pt stated he had not had any falls. He stated he was taking all prescribed medications and had transportation to his appointments.   Plan: Will see pt at his home on 03/09/15 at 1:30pm  [place], BSN  [facility] Care Management [phone])

## 2015-03-03 ENCOUNTER — Ambulatory Visit: Payer: PPO | Admitting: Physical Therapy

## 2015-03-09 ENCOUNTER — Other Ambulatory Visit: Payer: Self-pay | Admitting: *Deleted

## 2015-03-09 NOTE — Patient Outreach (Signed)
[facility]) Care Management   03/09/2015  Marc [name] 1941-04-29 [id number]  Marc [name] is an 74 y.o. male  Subjective:  " I stopped taking the norvasc they ordered for me because I really don't like taking medications." "I am feeling a lot better than I was but I am still dizzy when I stand and do not feel comfortable to drive."   Objective:Blood pressure 148/80, pulse 84, resp. rate 18, weight 158 lb (71.668 kg), SpO2 97 %.    Review of Systems  Constitutional: Negative.   HENT: Negative.   Eyes: Negative.   Respiratory: Negative.   Cardiovascular: Negative.   Gastrointestinal: Negative.   Genitourinary: Negative.   Musculoskeletal: Negative.   Neurological: Positive for dizziness.  Endo/Heme/Allergies: Negative.   Psychiatric/Behavioral: Negative.     Physical Exam  Constitutional: He is oriented to person, place, and time. He appears well-developed and well-nourished.  Cardiovascular: Normal rate and regular rhythm.   Respiratory: Effort normal and breath sounds normal.  GI: Soft. Normal appearance and bowel sounds are normal.  Musculoskeletal: Normal range of motion.  Neurological: He is alert and oriented to person, place, and time. He has normal strength.  Skin: Skin is warm, dry and intact.  Psychiatric: He has a normal mood and affect. His behavior is normal. Judgment and thought content normal.    Current Medications:   Current Outpatient Prescriptions  Medication Sig Dispense Refill  . carbamide peroxide (DEBROX) 6.5 % otic solution Place 5 drops into the right ear 2 (two) times daily. 15 mL 0  . carvedilol (COREG) 25 MG tablet Take 25 mg by mouth 2 (two) times daily with a meal.    . imipramine (TOFRANIL) 50 MG tablet Take 50 mg by mouth at bedtime.    . lovastatin (MEVACOR) 20 MG tablet Take 20 mg by mouth every evening.    . meclizine (ANTIVERT) 25 MG tablet Take 1 tablet (25 mg total) by mouth 3 (three) times daily. 30 tablet 0  .  amLODipine (NORVASC) 5 MG tablet Take 1 tablet (5 mg total) by mouth daily. (Patient not taking: Reported on 03/09/2015) 30 tablet 0   No current facility-administered medications for this visit.    Functional Status:   In your present state of health, do you have any difficulty performing the following activities: 03/09/2015 03/02/2015  Hearing? - -  Vision? - -  Difficulty concentrating or making decisions? - -  Walking or climbing stairs? - -  Dressing or bathing? - -  Doing errands, shopping? - Facilities manager and eating ? N -  Using the Toilet? - N  In the past six months, have you accidently leaked urine? N -  Do you have problems with loss of bowel control? N -  Managing your Medications? N -  Managing your Finances? N -  Housekeeping or managing your Housekeeping? N -    Fall/Depression Screening:    PHQ 2/9 Scores 03/09/2015  PHQ - 2 Score 0   Fall Risk  03/09/2015 03/02/2015  Falls in the past year? No No  Risk for fall due to : - Impaired balance/gait;Medication side effect    Assessment: See physical assessment as above.    Pt visit done as part of transition of care program. Pt alert and orientedx3. Pt not using Norvasc as ordered. Encouraged pt to make his MD aware and also to monitor and record b/p. Pt's spouse present at visit. Pt denies pain. Pt continues c/o dizziness  upon standing but reports improvement since hospitalization. Pt denies nausea. RNCM educated pt about fall safety.   Plan: RNCM will call pt next week as part of transition of care.   Tihanna Goodson RN, BSN  [facility] Care Management [phone])   [facility] CM Care Plan Problem One        Most Recent Value   Care Plan Problem One  Recent hospital stay for vertigo, increased fall risk   Role Documenting the Problem One  Care Management [place] for Problem One  Active   [facility] CM Short Term Goal #1 (0-30 days)  Pt will not readmit in the next 30 days   [facility] CM Short Term Goal #1 Start Date   02/22/15   Interventions for Short Term Goal #1  Transition of care program started, Pt encouraged to go to f/u appointments shceduled for Thursday.   [facility] CM Short Term Goal #2 (0-30 days)  Pt will be free from falls for the next 30 days   [facility] CM Short Term Goal #2 Start Date  02/22/15   Interventions for Short Term Goal #2  RNCM educated pt on fall safety in his home.    [facility] CM Short Term Goal #3 (0-30 days)  Pt will take and record b/p 3 times a week for the next 30 days   [facility] CM Short Term Goal #3 Start Date  03/09/15   Interventions for Short Tern Goal #3  RNCM educated pt on the importance of monitoring b/p since pt has decided not to take Norvasc.

## 2015-03-17 ENCOUNTER — Ambulatory Visit: Payer: PPO | Admitting: *Deleted

## 2015-03-18 ENCOUNTER — Other Ambulatory Visit: Payer: Self-pay | Admitting: *Deleted

## 2015-03-18 NOTE — Patient Outreach (Signed)
RNCM talked with pt as part of transition of care. Pt stated his dizziness was continuing to get a bit better each day and he was feeling a lot better than at his hospital admission. Pt stated he was continuing to check his blood pressure and his reading last night was 140/80. RNCM asked pt if he had made MD aware that he was not taking his Norvasc and he said he had. RNCM encouraged pt to continue checking to ensure b/p stayed wnl. Pt stated he went to the eye doctor and got new glasses. He stated the eye doctor fitted him for new glasses and let him know he had a small cataract. Pt has f/u scheduled with ENT next week. RNCM talked with pt about his needs and pt stated he was with out needs at this time. RNCM made pt aware she would be closing him to care management services and pt was in agreement. RNCM made pt aware if he had needs in the future to please contact [facility] for assistance.    Plan: RNCM will close pt to care management at this time  Santina Trillo RN, BSN  [facility] Care Management [phone])

## 2015-05-05 DIAGNOSIS — E119 Type 2 diabetes mellitus without complications: Secondary | ICD-10-CM | POA: Diagnosis not present

## 2015-05-05 DIAGNOSIS — I10 Essential (primary) hypertension: Secondary | ICD-10-CM | POA: Diagnosis not present

## 2015-05-05 DIAGNOSIS — E782 Mixed hyperlipidemia: Secondary | ICD-10-CM | POA: Diagnosis not present

## 2015-05-12 DIAGNOSIS — K5730 Diverticulosis of large intestine without perforation or abscess without bleeding: Secondary | ICD-10-CM | POA: Diagnosis not present

## 2015-05-12 DIAGNOSIS — E119 Type 2 diabetes mellitus without complications: Secondary | ICD-10-CM | POA: Diagnosis not present

## 2015-05-12 DIAGNOSIS — E782 Mixed hyperlipidemia: Secondary | ICD-10-CM | POA: Diagnosis not present

## 2015-05-12 DIAGNOSIS — I10 Essential (primary) hypertension: Secondary | ICD-10-CM | POA: Diagnosis not present

## 2015-11-09 DIAGNOSIS — K5730 Diverticulosis of large intestine without perforation or abscess without bleeding: Secondary | ICD-10-CM | POA: Diagnosis not present

## 2015-11-09 DIAGNOSIS — I10 Essential (primary) hypertension: Secondary | ICD-10-CM | POA: Diagnosis not present

## 2015-11-09 DIAGNOSIS — E782 Mixed hyperlipidemia: Secondary | ICD-10-CM | POA: Diagnosis not present

## 2015-11-09 DIAGNOSIS — E119 Type 2 diabetes mellitus without complications: Secondary | ICD-10-CM | POA: Diagnosis not present

## 2015-11-16 DIAGNOSIS — N400 Enlarged prostate without lower urinary tract symptoms: Secondary | ICD-10-CM | POA: Diagnosis not present

## 2015-11-16 DIAGNOSIS — E119 Type 2 diabetes mellitus without complications: Secondary | ICD-10-CM | POA: Diagnosis not present

## 2015-11-16 DIAGNOSIS — I10 Essential (primary) hypertension: Secondary | ICD-10-CM | POA: Diagnosis not present

## 2015-11-16 DIAGNOSIS — E782 Mixed hyperlipidemia: Secondary | ICD-10-CM | POA: Diagnosis not present

## 2015-11-16 DIAGNOSIS — Z0000 Encounter for general adult medical examination without abnormal findings: Secondary | ICD-10-CM | POA: Diagnosis not present

## 2015-11-30 DIAGNOSIS — Z86010 Personal history of colonic polyps: Secondary | ICD-10-CM | POA: Diagnosis not present

## 2015-11-30 DIAGNOSIS — R110 Nausea: Secondary | ICD-10-CM | POA: Diagnosis not present

## 2015-12-20 DIAGNOSIS — R351 Nocturia: Secondary | ICD-10-CM | POA: Diagnosis not present

## 2015-12-20 DIAGNOSIS — N5201 Erectile dysfunction due to arterial insufficiency: Secondary | ICD-10-CM | POA: Diagnosis not present

## 2015-12-20 DIAGNOSIS — R3915 Urgency of urination: Secondary | ICD-10-CM | POA: Diagnosis not present

## 2016-01-20 ENCOUNTER — Encounter: Payer: Self-pay | Admitting: *Deleted

## 2016-01-23 ENCOUNTER — Encounter
Admission: RE | Disposition: A | Discharge status: Home / Self Care | Payer: Self-pay | Source: Ambulatory Visit | Attending: Unknown Physician Specialty

## 2016-01-23 ENCOUNTER — Ambulatory Visit
Admission: RE | Admit: 2016-01-23 | Discharge: 2016-01-23 | Disposition: A | Discharge status: Home / Self Care | Payer: PPO | Source: Ambulatory Visit | Attending: Unknown Physician Specialty | Admitting: Unknown Physician Specialty

## 2016-01-23 ENCOUNTER — Ambulatory Visit: Payer: PPO | Admitting: Anesthesiology

## 2016-01-23 DIAGNOSIS — I10 Essential (primary) hypertension: Secondary | ICD-10-CM | POA: Diagnosis not present

## 2016-01-23 DIAGNOSIS — K640 First degree hemorrhoids: Secondary | ICD-10-CM | POA: Diagnosis not present

## 2016-01-23 DIAGNOSIS — Z79899 Other long term (current) drug therapy: Secondary | ICD-10-CM | POA: Insufficient documentation

## 2016-01-23 DIAGNOSIS — Z86010 Personal history of colonic polyps: Secondary | ICD-10-CM | POA: Insufficient documentation

## 2016-01-23 DIAGNOSIS — K621 Rectal polyp: Secondary | ICD-10-CM | POA: Insufficient documentation

## 2016-01-23 DIAGNOSIS — K5730 Diverticulosis of large intestine without perforation or abscess without bleeding: Secondary | ICD-10-CM | POA: Insufficient documentation

## 2016-01-23 DIAGNOSIS — E785 Hyperlipidemia, unspecified: Secondary | ICD-10-CM | POA: Diagnosis not present

## 2016-01-23 DIAGNOSIS — D123 Benign neoplasm of transverse colon: Secondary | ICD-10-CM | POA: Insufficient documentation

## 2016-01-23 DIAGNOSIS — D128 Benign neoplasm of rectum: Secondary | ICD-10-CM | POA: Diagnosis not present

## 2016-01-23 DIAGNOSIS — Z1211 Encounter for screening for malignant neoplasm of colon: Secondary | ICD-10-CM | POA: Insufficient documentation

## 2016-01-23 HISTORY — PX: COLONOSCOPY WITH PROPOFOL: SHX5780

## 2016-01-23 LAB — GLUCOSE, CAPILLARY: Glucose-Capillary: 154 mg/dL — ABNORMAL HIGH (ref 65–99)

## 2016-01-23 SURGERY — COLONOSCOPY WITH PROPOFOL
Anesthesia: General

## 2016-01-23 MED ORDER — MIDAZOLAM HCL 5 MG/5ML IJ SOLN
INTRAMUSCULAR | Status: DC | PRN
  Administered 2016-01-23: 1 mg via INTRAVENOUS

## 2016-01-23 MED ORDER — PROPOFOL 500 MG/50ML IV EMUL
INTRAVENOUS | Status: DC | PRN
  Administered 2016-01-23: 100 ug/kg/min via INTRAVENOUS

## 2016-01-23 MED ORDER — PROPOFOL 10 MG/ML IV BOLUS
INTRAVENOUS | Status: DC | PRN
  Administered 2016-01-23 (×2): 20 mg via INTRAVENOUS

## 2016-01-23 MED ORDER — EPHEDRINE SULFATE 50 MG/ML IJ SOLN
INTRAMUSCULAR | Status: DC | PRN
  Administered 2016-01-23: 10 mg via INTRAVENOUS
  Administered 2016-01-23: 5 mg via INTRAVENOUS
  Administered 2016-01-23: 10 mg via INTRAVENOUS

## 2016-01-23 MED ORDER — LIDOCAINE HCL (PF) 2 % IJ SOLN
INTRAMUSCULAR | Status: DC | PRN
  Administered 2016-01-23: 50 mg

## 2016-01-23 MED ORDER — FENTANYL CITRATE (PF) 100 MCG/2ML IJ SOLN
INTRAMUSCULAR | Status: DC | PRN
  Administered 2016-01-23: 50 ug via INTRAVENOUS

## 2016-01-23 MED ORDER — SODIUM CHLORIDE 0.9 % IV SOLN
INTRAVENOUS | Status: DC
  Administered 2016-01-23: 1000 mL via INTRAVENOUS

## 2016-01-23 MED ORDER — SODIUM CHLORIDE 0.9 % IV SOLN: INTRAVENOUS | Status: DC

## 2016-01-23 MED ORDER — PHENYLEPHRINE HCL 10 MG/ML IJ SOLN
INTRAMUSCULAR | Status: DC | PRN
  Administered 2016-01-23: 100 ug via INTRAVENOUS
  Administered 2016-01-23: 200 ug via INTRAVENOUS
  Administered 2016-01-23: 100 ug via INTRAVENOUS

## 2016-01-23 NOTE — Op Note (Signed)
[facility] Gastroenterology Patient Name: Marc [name] Procedure Date: 01/23/2016 7:33 AM MRN: [id number] Account #: [id number] Date of Birth: 1941-01-27 Admit Type: Outpatient Age: 75 Room: [facility] ENDO ROOM 4 Gender: Male Note Status: Finalized Procedure:            Colonoscopy Indications:          High risk colon cancer surveillance: Personal history                        of colonic polyps Providers:            Manya Silvas, MD Referring MD:         Tracie Harrier, MD (Referring MD) Medicines:            Propofol per Anesthesia Complications:        No immediate complications. Procedure:            Pre-Anesthesia Assessment:                       - After reviewing the risks and benefits, the patient                        was deemed in satisfactory condition to undergo the                        procedure.                       After obtaining informed consent, the colonoscope was                        passed under direct vision. Throughout the procedure,                        the patient's blood pressure, pulse, and oxygen                        saturations were monitored continuously. The                        Colonoscope was introduced through the anus and                        advanced to the the cecum, identified by appendiceal                        orifice and ileocecal valve. The colonoscopy was                        performed without difficulty. The patient tolerated the                        procedure well. The quality of the bowel preparation                        was good. Findings:      A small polyp was found in the rectum proximal transverse colon. The       polyp was sessile. The polyp was removed with a jumbo cold forceps.       Resection and retrieval were complete.      Multiple small and  large-mouthed diverticula were found in the sigmoid       colon and descending colon.      Internal hemorrhoids were found during endoscopy.  The hemorrhoids were       small and Grade I (internal hemorrhoids that do not prolapse).      Prostate gland enlarged but no nodules. Impression:           - One small polyp in the rectum in the proximal                        transverse colon, removed with a jumbo cold forceps.                        Resected and retrieved.                       - Diverticulosis in the sigmoid colon and in the                        descending colon.                       - Internal hemorrhoids. Recommendation:       - Await pathology results. Manya Silvas, MD 01/23/2016 8:02:35 AM This report has been signed electronically. Number of Addenda: 0 Note Initiated On: 01/23/2016 7:33 AM Scope Withdrawal Time: 0 hours 10 minutes 10 seconds  Total Procedure Duration: 0 hours 18 minutes 50 seconds       [facility]

## 2016-01-23 NOTE — Anesthesia Postprocedure Evaluation (Signed)
Anesthesia Post Note  Patient: Marc [name]  Procedure(s) Performed: Procedure(s) (LRB): COLONOSCOPY WITH PROPOFOL (N/A)  Patient location during evaluation: PACU Anesthesia Type: General Level of consciousness: awake and alert and oriented Pain management: pain level controlled Vital Signs Assessment: post-procedure vital signs reviewed and stable Respiratory status: spontaneous breathing, nonlabored ventilation and respiratory function stable Cardiovascular status: blood pressure returned to baseline and stable Postop Assessment: no signs of nausea or vomiting Anesthetic complications: no    Last Vitals:  Vitals:   01/23/16 0816 01/23/16 0820  BP: (!) 88/67 101/67  Pulse: 69 71  Resp: 13 12  Temp:      Last Pain:  Vitals:   01/23/16 0802  TempSrc: Tympanic                 Velinda Wrobel

## 2016-01-23 NOTE — H&P (Signed)
   Primary Care Physician:  Tracie Harrier, MD Primary Gastroenterologist:  Dr. Vira Agar  Pre-Procedure History & Physical: HPI:  Marc [name] is a 75 y.o. male is here for an colonoscopy.   Past Medical History:  Diagnosis Date  . Anxiety   . Hyperlipidemia   . Hypertension     Past Surgical History:  Procedure Laterality Date  . CHOLECYSTECTOMY      Prior to Admission medications   Medication Sig Start Date End Date Taking? Authorizing Provider  amLODipine (NORVASC) 5 MG tablet Take 1 tablet (5 mg total) by mouth daily. 02/18/15  Yes Richard Leslye Peer, MD  carbamide peroxide (DEBROX) 6.5 % otic solution Place 5 drops into the right ear 2 (two) times daily. 02/18/15  Yes Loletha Grayer, MD  carvedilol (COREG) 25 MG tablet Take 25 mg by mouth 2 (two) times daily with a meal.   Yes Historical Provider, MD  imipramine (TOFRANIL) 50 MG tablet Take 50 mg by mouth at bedtime.   Yes Historical Provider, MD  lovastatin (MEVACOR) 20 MG tablet Take 20 mg by mouth every evening.   Yes Historical Provider, MD  meclizine (ANTIVERT) 25 MG tablet Take 1 tablet (25 mg total) by mouth 3 (three) times daily. 02/18/15  Yes Loletha Grayer, MD    Allergies as of 01/13/2016 - Review Complete 03/09/2015  Allergen Reaction Noted  . Tape Itching and Rash 02/16/2015    Family History  Problem Relation Age of Onset  . Hypertension Other   . Diabetes Neg Hx     Social History   Social History  . Marital status: Married    Spouse name: N/A  . Number of children: N/A  . Years of education: N/A   Occupational History  . Not on file.   Social History Main Topics  . Smoking status: Never Smoker  . Smokeless tobacco: Not on file  . Alcohol use No  . Drug use: No  . Sexual activity: Not on file   Other Topics Concern  . Not on file   Social History Narrative  . No narrative on file    Review of Systems: See HPI, otherwise negative ROS  Physical Exam: BP 125/79   Pulse 78   Temp  (!) 96.4 F (35.8 C) (Tympanic)   Resp 16   Ht 5\' 7"  (1.702 m)   Wt 72.6 kg (160 lb)   SpO2 96%   BMI 25.06 kg/m  General:   Alert,  pleasant and cooperative in NAD Head:  Normocephalic and atraumatic. Neck:  Supple; no masses or thyromegaly. Lungs:  Clear throughout to auscultation.    Heart:  Regular rate and rhythm. Abdomen:  Soft, nontender and nondistended. Normal bowel sounds, without guarding, and without rebound.   Neurologic:  Alert and  oriented x4;  grossly normal neurologically.  Impression/Plan: Marc [name] is here for an colonoscopy to be performed for [facility] colon polyps  Risks, benefits, limitations, and alternatives regarding  colonoscopy have been reviewed with the patient.  Questions have been answered.  All parties agreeable.   Gaylyn Cheers, MD  01/23/2016, 7:34 AM

## 2016-01-23 NOTE — Transfer of Care (Signed)
Immediate Anesthesia Transfer of Care Note  Patient: Marc [name]  Procedure(s) Performed: Procedure(s): COLONOSCOPY WITH PROPOFOL (N/A)  Patient Location: PACU  Anesthesia Type:General  Level of Consciousness: sedated  Airway & Oxygen Therapy: Patient Spontanous Breathing and Patient connected to nasal cannula oxygen  Post-op Assessment: Report given to RN and Post -op Vital signs reviewed and stable  Post vital signs: Reviewed and stable  Last Vitals:  Vitals:   01/23/16 0805 01/23/16 0806  BP: (!) 86/55 (P) 104/69  Pulse: 68   Resp: 15   Temp:      Last Pain:  Vitals:   01/23/16 0802  TempSrc: Tympanic         Complications: No apparent anesthesia complications

## 2016-01-23 NOTE — Anesthesia Preprocedure Evaluation (Signed)
Anesthesia Evaluation  Patient identified by MRN, date of birth, ID band Patient awake    Reviewed: Allergy & Precautions, NPO status , Patient's Chart, lab work & pertinent test results, reviewed documented beta blocker date and time   History of Anesthesia Complications Negative for: history of anesthetic complications  Airway Mallampati: II  TM Distance: >3 FB Neck ROM: Full    Dental no notable dental hx.    Pulmonary neg pulmonary ROS, neg sleep apnea, neg COPD,    breath sounds clear to auscultation- rhonchi (-) wheezing      Cardiovascular Exercise Tolerance: Good hypertension, Pt. on medications and Pt. on home beta blockers (-) CAD and (-) Past MI  Rhythm:Regular Rate:Normal - Systolic murmurs and - Diastolic murmurs    Neuro/Psych Anxiety negative neurological ROS     GI/Hepatic negative GI ROS, Neg liver ROS,   Endo/Other  negative endocrine ROSneg diabetes  Renal/GU negative Renal ROS     Musculoskeletal   Abdominal (+) - obese,   Peds  Hematology   Anesthesia Other Findings   Reproductive/Obstetrics                             Anesthesia Physical Anesthesia Plan  ASA: II  Anesthesia Plan: General   Post-op Pain Management:    Induction: Intravenous  Airway Management Planned: Natural Airway  Additional Equipment:   Intra-op Plan:   Post-operative Plan:   Informed Consent: I have reviewed the patients History and Physical, chart, labs and discussed the procedure including the risks, benefits and alternatives for the proposed anesthesia with the patient or authorized representative who has indicated his/her understanding and acceptance.   Dental advisory given  Plan Discussed with: CRNA and Anesthesiologist  Anesthesia Plan Comments:         Anesthesia Quick Evaluation

## 2016-01-24 ENCOUNTER — Encounter: Payer: Self-pay | Admitting: Unknown Physician Specialty

## 2016-01-24 LAB — SURGICAL PATHOLOGY

## 2016-05-09 DIAGNOSIS — E782 Mixed hyperlipidemia: Secondary | ICD-10-CM | POA: Diagnosis not present

## 2016-05-09 DIAGNOSIS — N400 Enlarged prostate without lower urinary tract symptoms: Secondary | ICD-10-CM | POA: Diagnosis not present

## 2016-05-09 DIAGNOSIS — E119 Type 2 diabetes mellitus without complications: Secondary | ICD-10-CM | POA: Diagnosis not present

## 2016-05-09 DIAGNOSIS — I10 Essential (primary) hypertension: Secondary | ICD-10-CM | POA: Diagnosis not present

## 2016-05-23 DIAGNOSIS — N529 Male erectile dysfunction, unspecified: Secondary | ICD-10-CM | POA: Diagnosis not present

## 2016-05-23 DIAGNOSIS — I10 Essential (primary) hypertension: Secondary | ICD-10-CM | POA: Diagnosis not present

## 2016-05-23 DIAGNOSIS — Z0000 Encounter for general adult medical examination without abnormal findings: Secondary | ICD-10-CM | POA: Diagnosis not present

## 2016-05-23 DIAGNOSIS — E782 Mixed hyperlipidemia: Secondary | ICD-10-CM | POA: Diagnosis not present

## 2016-05-23 DIAGNOSIS — E119 Type 2 diabetes mellitus without complications: Secondary | ICD-10-CM | POA: Diagnosis not present

## 2016-05-23 DIAGNOSIS — N400 Enlarged prostate without lower urinary tract symptoms: Secondary | ICD-10-CM | POA: Diagnosis not present

## 2016-12-19 DIAGNOSIS — R3915 Urgency of urination: Secondary | ICD-10-CM | POA: Diagnosis not present

## 2016-12-19 DIAGNOSIS — N401 Enlarged prostate with lower urinary tract symptoms: Secondary | ICD-10-CM | POA: Diagnosis not present

## 2016-12-19 DIAGNOSIS — R351 Nocturia: Secondary | ICD-10-CM | POA: Diagnosis not present

## 2016-12-19 DIAGNOSIS — N5201 Erectile dysfunction due to arterial insufficiency: Secondary | ICD-10-CM | POA: Diagnosis not present

## 2017-01-15 DIAGNOSIS — E782 Mixed hyperlipidemia: Secondary | ICD-10-CM | POA: Diagnosis not present

## 2017-01-15 DIAGNOSIS — E119 Type 2 diabetes mellitus without complications: Secondary | ICD-10-CM | POA: Diagnosis not present

## 2017-01-15 DIAGNOSIS — N529 Male erectile dysfunction, unspecified: Secondary | ICD-10-CM | POA: Diagnosis not present

## 2017-01-15 DIAGNOSIS — I10 Essential (primary) hypertension: Secondary | ICD-10-CM | POA: Diagnosis not present

## 2017-01-15 DIAGNOSIS — N400 Enlarged prostate without lower urinary tract symptoms: Secondary | ICD-10-CM | POA: Diagnosis not present

## 2017-01-22 DIAGNOSIS — N400 Enlarged prostate without lower urinary tract symptoms: Secondary | ICD-10-CM | POA: Diagnosis not present

## 2017-01-22 DIAGNOSIS — I10 Essential (primary) hypertension: Secondary | ICD-10-CM | POA: Diagnosis not present

## 2017-01-22 DIAGNOSIS — E119 Type 2 diabetes mellitus without complications: Secondary | ICD-10-CM | POA: Diagnosis not present

## 2017-07-16 DIAGNOSIS — E785 Hyperlipidemia, unspecified: Secondary | ICD-10-CM | POA: Diagnosis not present

## 2017-07-16 DIAGNOSIS — E119 Type 2 diabetes mellitus without complications: Secondary | ICD-10-CM | POA: Diagnosis not present

## 2017-07-16 DIAGNOSIS — I10 Essential (primary) hypertension: Secondary | ICD-10-CM | POA: Diagnosis not present

## 2017-07-16 DIAGNOSIS — Z0000 Encounter for general adult medical examination without abnormal findings: Secondary | ICD-10-CM | POA: Diagnosis not present

## 2017-07-23 DIAGNOSIS — K5730 Diverticulosis of large intestine without perforation or abscess without bleeding: Secondary | ICD-10-CM | POA: Diagnosis not present

## 2017-07-23 DIAGNOSIS — I10 Essential (primary) hypertension: Secondary | ICD-10-CM | POA: Diagnosis not present

## 2017-07-23 DIAGNOSIS — E119 Type 2 diabetes mellitus without complications: Secondary | ICD-10-CM | POA: Diagnosis not present

## 2017-07-23 DIAGNOSIS — R634 Abnormal weight loss: Secondary | ICD-10-CM | POA: Diagnosis not present

## 2017-07-23 DIAGNOSIS — E782 Mixed hyperlipidemia: Secondary | ICD-10-CM | POA: Diagnosis not present

## 2017-08-05 ENCOUNTER — Other Ambulatory Visit: Payer: Self-pay | Admitting: Internal Medicine

## 2017-08-05 DIAGNOSIS — E118 Type 2 diabetes mellitus with unspecified complications: Secondary | ICD-10-CM

## 2017-08-05 DIAGNOSIS — R634 Abnormal weight loss: Secondary | ICD-10-CM

## 2017-08-06 ENCOUNTER — Ambulatory Visit
Admission: RE | Admit: 2017-08-06 | Discharge: 2017-08-06 | Disposition: A | Discharge status: Home / Self Care | Payer: PPO | Source: Ambulatory Visit | Attending: Internal Medicine | Admitting: Internal Medicine

## 2017-08-06 DIAGNOSIS — R634 Abnormal weight loss: Secondary | ICD-10-CM | POA: Diagnosis not present

## 2017-08-06 DIAGNOSIS — N400 Enlarged prostate without lower urinary tract symptoms: Secondary | ICD-10-CM | POA: Insufficient documentation

## 2017-08-06 DIAGNOSIS — M899 Disorder of bone, unspecified: Secondary | ICD-10-CM | POA: Diagnosis not present

## 2017-08-06 DIAGNOSIS — E119 Type 2 diabetes mellitus without complications: Secondary | ICD-10-CM | POA: Diagnosis not present

## 2017-08-06 DIAGNOSIS — I700 Atherosclerosis of aorta: Secondary | ICD-10-CM | POA: Diagnosis not present

## 2017-08-06 DIAGNOSIS — E118 Type 2 diabetes mellitus with unspecified complications: Secondary | ICD-10-CM

## 2017-08-06 DIAGNOSIS — K5730 Diverticulosis of large intestine without perforation or abscess without bleeding: Secondary | ICD-10-CM | POA: Insufficient documentation

## 2017-08-06 MED ORDER — IOPAMIDOL (ISOVUE-300) INJECTION 61%
100.0000 mL | Freq: Once | INTRAVENOUS | Status: AC | PRN
  Administered 2017-08-06: 100 mL via INTRAVENOUS

## 2017-08-14 ENCOUNTER — Other Ambulatory Visit: Payer: Self-pay | Admitting: Internal Medicine

## 2017-08-14 DIAGNOSIS — R935 Abnormal findings on diagnostic imaging of other abdominal regions, including retroperitoneum: Secondary | ICD-10-CM

## 2017-08-14 DIAGNOSIS — N400 Enlarged prostate without lower urinary tract symptoms: Secondary | ICD-10-CM

## 2017-08-22 ENCOUNTER — Other Ambulatory Visit: Payer: Self-pay | Admitting: Internal Medicine

## 2017-08-22 ENCOUNTER — Ambulatory Visit
Admission: RE | Admit: 2017-08-22 | Discharge: 2017-08-22 | Disposition: A | Discharge status: Home / Self Care | Payer: PPO | Source: Ambulatory Visit | Attending: Internal Medicine | Admitting: Internal Medicine

## 2017-08-22 DIAGNOSIS — R935 Abnormal findings on diagnostic imaging of other abdominal regions, including retroperitoneum: Secondary | ICD-10-CM

## 2017-08-22 DIAGNOSIS — N400 Enlarged prostate without lower urinary tract symptoms: Secondary | ICD-10-CM

## 2017-08-22 DIAGNOSIS — M899 Disorder of bone, unspecified: Secondary | ICD-10-CM | POA: Diagnosis not present

## 2017-08-22 DIAGNOSIS — M5136 Other intervertebral disc degeneration, lumbar region: Secondary | ICD-10-CM | POA: Insufficient documentation

## 2017-08-22 DIAGNOSIS — M48061 Spinal stenosis, lumbar region without neurogenic claudication: Secondary | ICD-10-CM | POA: Diagnosis not present

## 2017-08-22 MED ORDER — GADOBENATE DIMEGLUMINE 529 MG/ML IV SOLN: 15.0000 mL | Freq: Once | INTRAVENOUS | Status: DC | PRN

## 2017-10-15 DIAGNOSIS — R9431 Abnormal electrocardiogram [ECG] [EKG]: Secondary | ICD-10-CM | POA: Diagnosis not present

## 2017-10-15 DIAGNOSIS — I10 Essential (primary) hypertension: Secondary | ICD-10-CM | POA: Diagnosis not present

## 2017-10-15 DIAGNOSIS — K5790 Diverticulosis of intestine, part unspecified, without perforation or abscess without bleeding: Secondary | ICD-10-CM | POA: Diagnosis not present

## 2017-10-15 DIAGNOSIS — E782 Mixed hyperlipidemia: Secondary | ICD-10-CM | POA: Diagnosis not present

## 2017-10-15 DIAGNOSIS — F411 Generalized anxiety disorder: Secondary | ICD-10-CM | POA: Diagnosis not present

## 2017-10-15 DIAGNOSIS — I493 Ventricular premature depolarization: Secondary | ICD-10-CM | POA: Diagnosis not present

## 2017-10-15 DIAGNOSIS — R634 Abnormal weight loss: Secondary | ICD-10-CM | POA: Diagnosis not present

## 2017-10-15 DIAGNOSIS — R000 Tachycardia, unspecified: Secondary | ICD-10-CM | POA: Diagnosis not present

## 2017-10-28 DIAGNOSIS — R9431 Abnormal electrocardiogram [ECG] [EKG]: Secondary | ICD-10-CM | POA: Diagnosis not present

## 2017-10-28 DIAGNOSIS — I10 Essential (primary) hypertension: Secondary | ICD-10-CM | POA: Diagnosis not present

## 2017-10-28 DIAGNOSIS — E782 Mixed hyperlipidemia: Secondary | ICD-10-CM | POA: Diagnosis not present

## 2017-10-28 DIAGNOSIS — R5382 Chronic fatigue, unspecified: Secondary | ICD-10-CM | POA: Diagnosis not present

## 2017-10-28 DIAGNOSIS — E119 Type 2 diabetes mellitus without complications: Secondary | ICD-10-CM | POA: Diagnosis not present

## 2017-10-28 DIAGNOSIS — R011 Cardiac murmur, unspecified: Secondary | ICD-10-CM | POA: Diagnosis not present

## 2017-10-28 DIAGNOSIS — R0602 Shortness of breath: Secondary | ICD-10-CM | POA: Diagnosis not present

## 2017-11-07 DIAGNOSIS — R5382 Chronic fatigue, unspecified: Secondary | ICD-10-CM | POA: Diagnosis not present

## 2017-11-07 DIAGNOSIS — R0602 Shortness of breath: Secondary | ICD-10-CM | POA: Diagnosis not present

## 2017-11-07 DIAGNOSIS — R9431 Abnormal electrocardiogram [ECG] [EKG]: Secondary | ICD-10-CM | POA: Diagnosis not present

## 2017-11-07 DIAGNOSIS — R011 Cardiac murmur, unspecified: Secondary | ICD-10-CM | POA: Diagnosis not present

## 2017-11-12 DIAGNOSIS — R42 Dizziness and giddiness: Secondary | ICD-10-CM | POA: Diagnosis not present

## 2017-11-12 DIAGNOSIS — R0989 Other specified symptoms and signs involving the circulatory and respiratory systems: Secondary | ICD-10-CM | POA: Diagnosis not present

## 2017-11-12 DIAGNOSIS — R634 Abnormal weight loss: Secondary | ICD-10-CM | POA: Diagnosis not present

## 2017-11-12 DIAGNOSIS — E119 Type 2 diabetes mellitus without complications: Secondary | ICD-10-CM | POA: Diagnosis not present

## 2017-11-14 DIAGNOSIS — R0602 Shortness of breath: Secondary | ICD-10-CM | POA: Diagnosis not present

## 2017-11-14 DIAGNOSIS — R5382 Chronic fatigue, unspecified: Secondary | ICD-10-CM | POA: Diagnosis not present

## 2017-11-14 DIAGNOSIS — R9431 Abnormal electrocardiogram [ECG] [EKG]: Secondary | ICD-10-CM | POA: Diagnosis not present

## 2017-11-14 DIAGNOSIS — E782 Mixed hyperlipidemia: Secondary | ICD-10-CM | POA: Diagnosis not present

## 2017-11-14 DIAGNOSIS — I10 Essential (primary) hypertension: Secondary | ICD-10-CM | POA: Diagnosis not present

## 2017-11-21 DIAGNOSIS — R634 Abnormal weight loss: Secondary | ICD-10-CM | POA: Diagnosis not present

## 2017-11-21 DIAGNOSIS — R110 Nausea: Secondary | ICD-10-CM | POA: Diagnosis not present

## 2017-11-26 DIAGNOSIS — R634 Abnormal weight loss: Secondary | ICD-10-CM | POA: Diagnosis not present

## 2017-11-27 ENCOUNTER — Ambulatory Visit: Payer: PPO | Admitting: Anesthesiology

## 2017-11-27 ENCOUNTER — Encounter
Admission: RE | Disposition: A | Discharge status: Home / Self Care | Payer: Self-pay | Source: Ambulatory Visit | Attending: Unknown Physician Specialty

## 2017-11-27 ENCOUNTER — Ambulatory Visit
Admission: RE | Admit: 2017-11-27 | Discharge: 2017-11-27 | Disposition: A | Discharge status: Home / Self Care | Payer: PPO | Source: Ambulatory Visit | Attending: Unknown Physician Specialty | Admitting: Unknown Physician Specialty

## 2017-11-27 ENCOUNTER — Encounter: Payer: Self-pay | Admitting: Anesthesiology

## 2017-11-27 DIAGNOSIS — Z6820 Body mass index (BMI) 20.0-20.9, adult: Secondary | ICD-10-CM | POA: Insufficient documentation

## 2017-11-27 DIAGNOSIS — I10 Essential (primary) hypertension: Secondary | ICD-10-CM | POA: Insufficient documentation

## 2017-11-27 DIAGNOSIS — E785 Hyperlipidemia, unspecified: Secondary | ICD-10-CM

## 2017-11-27 DIAGNOSIS — R634 Abnormal weight loss: Secondary | ICD-10-CM | POA: Insufficient documentation

## 2017-11-27 DIAGNOSIS — E119 Type 2 diabetes mellitus without complications: Secondary | ICD-10-CM | POA: Insufficient documentation

## 2017-11-27 DIAGNOSIS — Z86010 Personal history of colonic polyps: Secondary | ICD-10-CM | POA: Insufficient documentation

## 2017-11-27 DIAGNOSIS — K2950 Unspecified chronic gastritis without bleeding: Secondary | ICD-10-CM

## 2017-11-27 DIAGNOSIS — Z79899 Other long term (current) drug therapy: Secondary | ICD-10-CM

## 2017-11-27 DIAGNOSIS — Z79818 Long term (current) use of other agents affecting estrogen receptors and estrogen levels: Secondary | ICD-10-CM

## 2017-11-27 DIAGNOSIS — B9681 Helicobacter pylori [H. pylori] as the cause of diseases classified elsewhere: Secondary | ICD-10-CM | POA: Insufficient documentation

## 2017-11-27 DIAGNOSIS — K219 Gastro-esophageal reflux disease without esophagitis: Secondary | ICD-10-CM | POA: Insufficient documentation

## 2017-11-27 DIAGNOSIS — K210 Gastro-esophageal reflux disease with esophagitis: Secondary | ICD-10-CM | POA: Diagnosis not present

## 2017-11-27 DIAGNOSIS — K449 Diaphragmatic hernia without obstruction or gangrene: Secondary | ICD-10-CM

## 2017-11-27 DIAGNOSIS — K2960 Other gastritis without bleeding: Secondary | ICD-10-CM | POA: Diagnosis not present

## 2017-11-27 DIAGNOSIS — K228 Other specified diseases of esophagus: Secondary | ICD-10-CM

## 2017-11-27 DIAGNOSIS — F419 Anxiety disorder, unspecified: Secondary | ICD-10-CM | POA: Diagnosis not present

## 2017-11-27 HISTORY — DX: Benign neoplasm of colon, unspecified: D12.6

## 2017-11-27 HISTORY — DX: Benign prostatic hyperplasia without lower urinary tract symptoms: N40.0

## 2017-11-27 HISTORY — DX: Diverticulosis of intestine, part unspecified, without perforation or abscess without bleeding: K57.90

## 2017-11-27 HISTORY — PX: ESOPHAGOGASTRODUODENOSCOPY (EGD) WITH PROPOFOL: SHX5813

## 2017-11-27 SURGERY — ESOPHAGOGASTRODUODENOSCOPY (EGD) WITH PROPOFOL
Anesthesia: General

## 2017-11-27 MED ORDER — PROPOFOL 10 MG/ML IV BOLUS
INTRAVENOUS | Status: DC | PRN
  Administered 2017-11-27: 50 mg via INTRAVENOUS

## 2017-11-27 MED ORDER — LIDOCAINE HCL (CARDIAC) PF 100 MG/5ML IV SOSY
PREFILLED_SYRINGE | INTRAVENOUS | Status: DC | PRN
  Administered 2017-11-27: 50 mg via INTRAVENOUS

## 2017-11-27 MED ORDER — SODIUM CHLORIDE 0.9 % IV SOLN: INTRAVENOUS | Status: DC

## 2017-11-27 MED ORDER — PROPOFOL 500 MG/50ML IV EMUL
INTRAVENOUS | Status: DC | PRN
  Administered 2017-11-27: 175 ug/kg/min via INTRAVENOUS

## 2017-11-27 MED ORDER — LIDOCAINE HCL (PF) 1 % IJ SOLN
2.0000 mL | Freq: Once | INTRAMUSCULAR | Status: AC
  Administered 2017-11-27: 0.3 mL via INTRADERMAL

## 2017-11-27 MED ORDER — SODIUM CHLORIDE 0.9 % IV SOLN
INTRAVENOUS | Status: DC
  Administered 2017-11-27: 1000 mL via INTRAVENOUS

## 2017-11-27 MED ORDER — LIDOCAINE HCL (PF) 1 % IJ SOLN
INTRAMUSCULAR | Status: AC
  Administered 2017-11-27: 0.3 mL via INTRADERMAL
  Filled 2017-11-27: qty 2

## 2017-11-27 NOTE — Anesthesia Preprocedure Evaluation (Signed)
Anesthesia Evaluation  Patient identified by MRN, date of birth, ID band Patient awake    Reviewed: Allergy & Precautions, H&P , NPO status , Patient's Chart, lab work & pertinent test results  History of Anesthesia Complications Negative for: history of anesthetic complications  Airway Mallampati: III  TM Distance: <3 FB Neck ROM: limited    Dental  (+) Chipped, Poor Dentition   Pulmonary neg pulmonary ROS, neg shortness of breath,           Cardiovascular Exercise Tolerance: Good hypertension, (-) angina(-) Past MI and (-) DOE      Neuro/Psych PSYCHIATRIC DISORDERS Anxiety negative neurological ROS     GI/Hepatic negative GI ROS, Neg liver ROS,   Endo/Other  diabetes, Type 2  Renal/GU negative Renal ROS  negative genitourinary   Musculoskeletal   Abdominal   Peds  Hematology negative hematology ROS (+)   Anesthesia Other Findings Past Medical History: No date: Anxiety No date: BPH (benign prostatic hyperplasia) No date: Colon adenomas     Comment:  history of No date: Diabetes mellitus without complication (HCC) No date: Diverticulosis No date: Hyperlipidemia No date: Hypertension  Past Surgical History: No date: CHOLECYSTECTOMY 01/23/2016: COLONOSCOPY WITH PROPOFOL; N/A     Comment:  Procedure: COLONOSCOPY WITH PROPOFOL;  Surgeon: Manya Silvas, MD;  [facility]: [facility] ENDOSCOPY;  Service:               Endoscopy;  Laterality: N/A;     Reproductive/Obstetrics negative OB ROS                             Anesthesia Physical Anesthesia Plan  ASA: III  Anesthesia Plan: General   Post-op Pain Management:    Induction: Intravenous  PONV Risk Score and Plan: Propofol infusion and TIVA  Airway Management Planned: Natural Airway and Nasal Cannula  Additional Equipment:   Intra-op Plan:   Post-operative Plan:   Informed Consent: I have reviewed the  patients History and Physical, chart, labs and discussed the procedure including the risks, benefits and alternatives for the proposed anesthesia with the patient or authorized representative who has indicated his/her understanding and acceptance.   Dental Advisory Given  Plan Discussed with: Anesthesiologist, CRNA and Surgeon  Anesthesia Plan Comments: (Patient consented for risks of anesthesia including but not limited to:  - adverse reactions to medications - risk of intubation if required - damage to teeth, lips or other oral mucosa - sore throat or hoarseness - Damage to heart, brain, lungs or loss of life  Patient voiced understanding.)        Anesthesia Quick Evaluation

## 2017-11-27 NOTE — Op Note (Signed)
[facility] Patient Name: Marc [name] Procedure Date: 11/27/2017 12:11 PM MRN: [id number] Account #: [id number] Date of Birth: [birth year] Admit Type: Outpatient Age: 77 Room: [facility] ENDO ROOM 3 Gender: Male Note Status: Finalized Procedure:            Upper GI endoscopy Indications:          Weight loss Providers:            Manya Silvas, MD Referring MD:         Tracie Harrier, MD (Referring MD) Medicines:            Propofol per Anesthesia Complications:        No immediate complications. Procedure:            Pre-Anesthesia Assessment:                       - After reviewing the risks and benefits, the patient                        was deemed in satisfactory condition to undergo the                        procedure.                       After obtaining informed consent, the endoscope was                        passed under direct vision. Throughout the procedure,                        the patient's blood pressure, pulse, and oxygen                        saturations were monitored continuously. The Endoscope                        was introduced through the mouth, and advanced to the                        second part of duodenum. The upper GI endoscopy was                        accomplished without difficulty. The patient tolerated                        the procedure well. Findings:      There were esophageal mucosal changes suggestive of short-segment       Barrett's esophagus present at the gastroesophageal junction. The       maximum longitudinal extent of these mucosal changes was 2 cm in length.       Mucosa was biopsied with a cold forceps for histology. One specimen       bottle was sent to pathology.      A small hiatal hernia was present.      Diffuse and patchy mild inflammation characterized by erythema and       granularity was found in the gastric body and in the gastric antrum.      The duodenal bulb and second  portion of the duodenum were normal.       Biopsies  were taken with a cold forceps for histology. Done from 2ed       portion. Impression:           - Esophageal mucosal changes suggestive of                        short-segment Barrett's esophagus. Biopsied.                       - Small hiatal hernia.                       - Gastritis.                       - Normal duodenal bulb and second portion of the                        duodenum. Biopsied. Recommendation:       - Await pathology results. Manya Silvas, MD 11/27/2017 12:37:18 PM This report has been signed electronically. Number of Addenda: 0 Note Initiated On: 11/27/2017 12:11 PM      [facility]

## 2017-11-27 NOTE — Transfer of Care (Signed)
Immediate Anesthesia Transfer of Care Note  Patient: Marc [name]  Procedure(s) Performed: ESOPHAGOGASTRODUODENOSCOPY (EGD) WITH PROPOFOL (N/A )  Patient Location: PACU  Anesthesia Type:General  Level of Consciousness: sedated  Airway & Oxygen Therapy: Patient Spontanous Breathing and Patient connected to nasal cannula oxygen  Post-op Assessment: Report given to RN and Post -op Vital signs reviewed and stable  Post vital signs: Reviewed and stable  Last Vitals:  Vitals Value Taken Time  BP 162/82 11/27/2017 12:37 PM  Temp 36.2 C 11/27/2017 12:38 PM  Pulse 65 11/27/2017 12:38 PM  Resp 19 11/27/2017 12:38 PM  SpO2 89 % 11/27/2017 12:38 PM  Vitals shown include unvalidated device data.  Last Pain:  Vitals:   11/27/17 1238  TempSrc: Tympanic  PainSc:          Complications: No apparent anesthesia complications

## 2017-11-27 NOTE — H&P (Signed)
Primary Care Physician:  Tracie Harrier, MD Primary Gastroenterologist:  Dr. Vira Agar  Pre-Procedure History & Physical: HPI:  Marc [name] is a 77 y.o. male is here for an endoscopy.  Needed for weight loss of unexplained cause.   Past Medical History:  Diagnosis Date  . Anxiety   . BPH (benign prostatic hyperplasia)   . Colon adenomas    history of  . Diverticulosis   . Hyperlipidemia   . Hypertension     Past Surgical History:  Procedure Laterality Date  . CHOLECYSTECTOMY    . COLONOSCOPY WITH PROPOFOL N/A 01/23/2016   Procedure: COLONOSCOPY WITH PROPOFOL;  Surgeon: Manya Silvas, MD;  Location: [facility] ENDOSCOPY;  Service: Endoscopy;  Laterality: N/A;    Prior to Admission medications   Medication Sig Start Date End Date Taking? Authorizing Provider  megestrol (MEGACE) 40 MG/ML suspension Take by mouth 2 (two) times daily.   Yes [provider]  amLODipine (NORVASC) 5 MG tablet Take 1 tablet (5 mg total) by mouth daily. 02/18/15   Loletha Grayer, MD  carbamide peroxide (DEBROX) 6.5 % otic solution Place 5 drops into the right ear 2 (two) times daily. 02/18/15   Loletha Grayer, MD  carvedilol (COREG) 25 MG tablet Take 25 mg by mouth 2 (two) times daily with a meal.    [provider]  imipramine (TOFRANIL) 50 MG tablet Take 50 mg by mouth at bedtime.    [provider]  lovastatin (MEVACOR) 20 MG tablet Take 20 mg by mouth every evening.    [provider]  meclizine (ANTIVERT) 25 MG tablet Take 1 tablet (25 mg total) by mouth 3 (three) times daily. 02/18/15   Loletha Grayer, MD    Allergies as of 11/26/2017 - Review Complete 11/26/2017  Allergen Reaction Noted  . Glimepiride  01/20/2016  . Tape Itching and Rash 02/16/2015    Family History  Problem Relation Age of Onset  . Hypertension Other   . Diabetes Neg Hx     Social History   Socioeconomic History  . Marital status: Married    Spouse name: Not on file  .  Number of children: Not on file  . Years of education: Not on file  . Highest education level: Not on file  Occupational History  . Not on file  Social Needs  . Financial resource strain: Not on file  . Food insecurity:    Worry: Not on file    Inability: Not on file  . Transportation needs:    Medical: Not on file    Non-medical: Not on file  Tobacco Use  . Smoking status: Never Smoker  . Smokeless tobacco: Never Used  Substance and Sexual Activity  . Alcohol use: No  . Drug use: No  . Sexual activity: Not on file  Lifestyle  . Physical activity:    Days per week: Not on file    Minutes per session: Not on file  . Stress: Not on file  Relationships  . Social connections:    Talks on phone: Not on file    Gets together: Not on file    Attends religious service: Not on file    Active member of club or organization: Not on file    Attends meetings of clubs or organizations: Not on file    Relationship status: Not on file  . Intimate partner violence:    Fear of current or ex partner: Not on file    Emotionally abused: Not on  file    Physically abused: Not on file    Forced sexual activity: Not on file  Other Topics Concern  . Not on file  Social History Narrative  . Not on file    Review of Systems: See HPI, otherwise negative ROS  Physical Exam: BP (!) 189/104   Pulse 91   Temp (!) 96.8 F (36 C) (Tympanic)   Resp 17   Ht 5\' 7"  (1.702 m)   Wt 58.1 kg (128 lb)   SpO2 94%   BMI 20.05 kg/m  General:   Alert,  pleasant and cooperative in NAD Head:  Normocephalic and atraumatic. Neck:  Supple; no masses or thyromegaly. Lungs:  Clear throughout to auscultation.    Heart:  Regular rate and rhythm. Abdomen:  Soft, nontender and nondistended. Normal bowel sounds, without guarding, and without rebound.   Neurologic:  Alert and  oriented x4;  grossly normal neurologically.  Impression/Plan: Marc [name] is here for an endoscopy to be performed for unexplained  weight loss.  Risks, benefits, limitations, and alternatives regarding  endoscopy have been reviewed with the patient.  Questions have been answered.  All parties agreeable.   Gaylyn Cheers, MD  11/27/2017, 12:14 PM

## 2017-11-27 NOTE — Anesthesia Procedure Notes (Signed)
Date/Time: 11/27/2017 12:18 PM Performed by: Johnna Acosta, CRNA Pre-anesthesia Checklist: Patient identified, Emergency Drugs available, Suction available, Patient being monitored and Timeout performed Patient Re-evaluated:Patient Re-evaluated prior to induction Oxygen Delivery Method: Nasal cannula Preoxygenation: Pre-oxygenation with 100% oxygen

## 2017-11-27 NOTE — Anesthesia Post-op Follow-up Note (Signed)
Anesthesia QCDR form completed.        

## 2017-11-28 ENCOUNTER — Encounter: Payer: Self-pay | Admitting: Unknown Physician Specialty

## 2017-11-28 NOTE — Anesthesia Postprocedure Evaluation (Signed)
Anesthesia Post Note  Patient: [name]  Procedure(s) Performed: ESOPHAGOGASTRODUODENOSCOPY (EGD) WITH PROPOFOL (N/A )  Patient location during evaluation: Endoscopy Anesthesia Type: General Level of consciousness: awake and alert Pain management: pain level controlled Vital Signs Assessment: post-procedure vital signs reviewed and stable Respiratory status: spontaneous breathing, nonlabored ventilation, respiratory function stable and patient connected to nasal cannula oxygen Cardiovascular status: blood pressure returned to baseline and stable Postop Assessment: no apparent nausea or vomiting Anesthetic complications: no     Last Vitals:  Vitals:   11/27/17 1317 11/27/17 1325  BP: (!) 199/111 (!) 182/105  Pulse: 75 72  Resp: (!) 22 17  Temp:    SpO2: 94% 93%    Last Pain:  Vitals:   11/28/17 0739  TempSrc:   PainSc: 0-No pain                 Precious Haws Jaselyn Nahm

## 2017-11-29 ENCOUNTER — Inpatient Hospital Stay
Admission: EM | Admit: 2017-11-29 | Discharge: 2017-12-28 | DRG: 003 | Disposition: A | Discharge status: Skilled Nursing Facility | Payer: PPO | Attending: Internal Medicine | Admitting: Internal Medicine

## 2017-11-29 ENCOUNTER — Emergency Department: Payer: PPO

## 2017-11-29 ENCOUNTER — Other Ambulatory Visit: Payer: Self-pay

## 2017-11-29 DIAGNOSIS — I213 ST elevation (STEMI) myocardial infarction of unspecified site: Secondary | ICD-10-CM | POA: Diagnosis not present

## 2017-11-29 DIAGNOSIS — E785 Hyperlipidemia, unspecified: Secondary | ICD-10-CM | POA: Diagnosis not present

## 2017-11-29 DIAGNOSIS — R401 Stupor: Secondary | ICD-10-CM | POA: Diagnosis not present

## 2017-11-29 DIAGNOSIS — I4891 Unspecified atrial fibrillation: Secondary | ICD-10-CM | POA: Diagnosis not present

## 2017-11-29 DIAGNOSIS — Z430 Encounter for attention to tracheostomy: Secondary | ICD-10-CM

## 2017-11-29 DIAGNOSIS — R402433 Glasgow coma scale score 3-8, at hospital admission: Secondary | ICD-10-CM

## 2017-11-29 DIAGNOSIS — J90 Pleural effusion, not elsewhere classified: Secondary | ICD-10-CM

## 2017-11-29 DIAGNOSIS — A419 Sepsis, unspecified organism: Secondary | ICD-10-CM | POA: Diagnosis not present

## 2017-11-29 DIAGNOSIS — E870 Hyperosmolality and hypernatremia: Secondary | ICD-10-CM | POA: Diagnosis not present

## 2017-11-29 DIAGNOSIS — R490 Dysphonia: Secondary | ICD-10-CM | POA: Diagnosis not present

## 2017-11-29 DIAGNOSIS — I469 Cardiac arrest, cause unspecified: Secondary | ICD-10-CM | POA: Diagnosis not present

## 2017-11-29 DIAGNOSIS — R600 Localized edema: Secondary | ICD-10-CM

## 2017-11-29 DIAGNOSIS — J9690 Respiratory failure, unspecified, unspecified whether with hypoxia or hypercapnia: Secondary | ICD-10-CM

## 2017-11-29 DIAGNOSIS — T68XXXA Hypothermia, initial encounter: Secondary | ICD-10-CM | POA: Diagnosis present

## 2017-11-29 DIAGNOSIS — N390 Urinary tract infection, site not specified: Secondary | ICD-10-CM | POA: Diagnosis present

## 2017-11-29 DIAGNOSIS — F419 Anxiety disorder, unspecified: Secondary | ICD-10-CM | POA: Diagnosis not present

## 2017-11-29 DIAGNOSIS — B009 Herpesviral infection, unspecified: Secondary | ICD-10-CM | POA: Diagnosis present

## 2017-11-29 DIAGNOSIS — I248 Other forms of acute ischemic heart disease: Secondary | ICD-10-CM | POA: Diagnosis not present

## 2017-11-29 DIAGNOSIS — R0902 Hypoxemia: Secondary | ICD-10-CM | POA: Diagnosis not present

## 2017-11-29 DIAGNOSIS — I82629 Acute embolism and thrombosis of deep veins of unspecified upper extremity: Secondary | ICD-10-CM | POA: Diagnosis not present

## 2017-11-29 DIAGNOSIS — J9501 Hemorrhage from tracheostomy stoma: Secondary | ICD-10-CM | POA: Diagnosis not present

## 2017-11-29 DIAGNOSIS — R42 Dizziness and giddiness: Secondary | ICD-10-CM | POA: Diagnosis not present

## 2017-11-29 DIAGNOSIS — J9601 Acute respiratory failure with hypoxia: Principal | ICD-10-CM | POA: Diagnosis present

## 2017-11-29 DIAGNOSIS — B9681 Helicobacter pylori [H. pylori] as the cause of diseases classified elsewhere: Secondary | ICD-10-CM | POA: Diagnosis not present

## 2017-11-29 DIAGNOSIS — I161 Hypertensive emergency: Secondary | ICD-10-CM | POA: Diagnosis not present

## 2017-11-29 DIAGNOSIS — E1165 Type 2 diabetes mellitus with hyperglycemia: Secondary | ICD-10-CM | POA: Diagnosis present

## 2017-11-29 DIAGNOSIS — J690 Pneumonitis due to inhalation of food and vomit: Secondary | ICD-10-CM | POA: Diagnosis present

## 2017-11-29 DIAGNOSIS — Z6820 Body mass index (BMI) 20.0-20.9, adult: Secondary | ICD-10-CM

## 2017-11-29 DIAGNOSIS — T45515A Adverse effect of anticoagulants, initial encounter: Secondary | ICD-10-CM | POA: Diagnosis present

## 2017-11-29 DIAGNOSIS — I82611 Acute embolism and thrombosis of superficial veins of right upper extremity: Secondary | ICD-10-CM | POA: Diagnosis not present

## 2017-11-29 DIAGNOSIS — I82B11 Acute embolism and thrombosis of right subclavian vein: Secondary | ICD-10-CM | POA: Diagnosis not present

## 2017-11-29 DIAGNOSIS — Y92230 Patient room in hospital as the place of occurrence of the external cause: Secondary | ICD-10-CM | POA: Diagnosis not present

## 2017-11-29 DIAGNOSIS — E860 Dehydration: Secondary | ICD-10-CM | POA: Diagnosis not present

## 2017-11-29 DIAGNOSIS — J15212 Pneumonia due to Methicillin resistant Staphylococcus aureus: Secondary | ICD-10-CM | POA: Diagnosis present

## 2017-11-29 DIAGNOSIS — G9389 Other specified disorders of brain: Secondary | ICD-10-CM | POA: Diagnosis not present

## 2017-11-29 DIAGNOSIS — I5021 Acute systolic (congestive) heart failure: Secondary | ICD-10-CM | POA: Diagnosis not present

## 2017-11-29 DIAGNOSIS — R1310 Dysphagia, unspecified: Secondary | ICD-10-CM | POA: Diagnosis not present

## 2017-11-29 DIAGNOSIS — K2970 Gastritis, unspecified, without bleeding: Secondary | ICD-10-CM

## 2017-11-29 DIAGNOSIS — D689 Coagulation defect, unspecified: Secondary | ICD-10-CM | POA: Diagnosis not present

## 2017-11-29 DIAGNOSIS — J9811 Atelectasis: Secondary | ICD-10-CM | POA: Diagnosis not present

## 2017-11-29 DIAGNOSIS — J189 Pneumonia, unspecified organism: Secondary | ICD-10-CM | POA: Diagnosis not present

## 2017-11-29 DIAGNOSIS — E43 Unspecified severe protein-calorie malnutrition: Secondary | ICD-10-CM | POA: Diagnosis not present

## 2017-11-29 DIAGNOSIS — J9809 Other diseases of bronchus, not elsewhere classified: Secondary | ICD-10-CM | POA: Diagnosis present

## 2017-11-29 DIAGNOSIS — J811 Chronic pulmonary edema: Secondary | ICD-10-CM | POA: Diagnosis present

## 2017-11-29 DIAGNOSIS — Y848 Other medical procedures as the cause of abnormal reaction of the patient, or of later complication, without mention of misadventure at the time of the procedure: Secondary | ICD-10-CM | POA: Diagnosis not present

## 2017-11-29 DIAGNOSIS — Z9889 Other specified postprocedural states: Secondary | ICD-10-CM | POA: Diagnosis not present

## 2017-11-29 DIAGNOSIS — L8990 Pressure ulcer of unspecified site, unspecified stage: Secondary | ICD-10-CM

## 2017-11-29 DIAGNOSIS — I10 Essential (primary) hypertension: Secondary | ICD-10-CM | POA: Diagnosis present

## 2017-11-29 DIAGNOSIS — X58XXXA Exposure to other specified factors, initial encounter: Secondary | ICD-10-CM | POA: Diagnosis present

## 2017-11-29 DIAGNOSIS — Z7901 Long term (current) use of anticoagulants: Secondary | ICD-10-CM

## 2017-11-29 DIAGNOSIS — Z7189 Other specified counseling: Secondary | ICD-10-CM

## 2017-11-29 DIAGNOSIS — Z9049 Acquired absence of other specified parts of digestive tract: Secondary | ICD-10-CM

## 2017-11-29 DIAGNOSIS — R140 Abdominal distension (gaseous): Secondary | ICD-10-CM | POA: Diagnosis not present

## 2017-11-29 DIAGNOSIS — R633 Feeding difficulties: Secondary | ICD-10-CM | POA: Diagnosis not present

## 2017-11-29 DIAGNOSIS — Z09 Encounter for follow-up examination after completed treatment for conditions other than malignant neoplasm: Secondary | ICD-10-CM

## 2017-11-29 DIAGNOSIS — R1311 Dysphagia, oral phase: Secondary | ICD-10-CM | POA: Diagnosis not present

## 2017-11-29 DIAGNOSIS — Z431 Encounter for attention to gastrostomy: Secondary | ICD-10-CM | POA: Diagnosis not present

## 2017-11-29 DIAGNOSIS — R609 Edema, unspecified: Secondary | ICD-10-CM

## 2017-11-29 DIAGNOSIS — M6289 Other specified disorders of muscle: Secondary | ICD-10-CM | POA: Diagnosis present

## 2017-11-29 DIAGNOSIS — R531 Weakness: Secondary | ICD-10-CM

## 2017-11-29 DIAGNOSIS — G9340 Encephalopathy, unspecified: Secondary | ICD-10-CM

## 2017-11-29 DIAGNOSIS — J9602 Acute respiratory failure with hypercapnia: Secondary | ICD-10-CM | POA: Diagnosis not present

## 2017-11-29 DIAGNOSIS — Z9911 Dependence on respirator [ventilator] status: Secondary | ICD-10-CM

## 2017-11-29 DIAGNOSIS — I214 Non-ST elevation (NSTEMI) myocardial infarction: Secondary | ICD-10-CM | POA: Diagnosis not present

## 2017-11-29 DIAGNOSIS — N179 Acute kidney failure, unspecified: Secondary | ICD-10-CM | POA: Diagnosis not present

## 2017-11-29 DIAGNOSIS — I959 Hypotension, unspecified: Secondary | ICD-10-CM | POA: Diagnosis not present

## 2017-11-29 DIAGNOSIS — Z930 Tracheostomy status: Secondary | ICD-10-CM | POA: Diagnosis not present

## 2017-11-29 DIAGNOSIS — J181 Lobar pneumonia, unspecified organism: Secondary | ICD-10-CM | POA: Diagnosis not present

## 2017-11-29 DIAGNOSIS — I252 Old myocardial infarction: Secondary | ICD-10-CM

## 2017-11-29 DIAGNOSIS — K2960 Other gastritis without bleeding: Secondary | ICD-10-CM | POA: Diagnosis not present

## 2017-11-29 DIAGNOSIS — J9621 Acute and chronic respiratory failure with hypoxia: Secondary | ICD-10-CM

## 2017-11-29 DIAGNOSIS — R410 Disorientation, unspecified: Secondary | ICD-10-CM | POA: Diagnosis not present

## 2017-11-29 DIAGNOSIS — R001 Bradycardia, unspecified: Secondary | ICD-10-CM | POA: Diagnosis not present

## 2017-11-29 DIAGNOSIS — Y828 Other medical devices associated with adverse incidents: Secondary | ICD-10-CM | POA: Diagnosis not present

## 2017-11-29 DIAGNOSIS — T17590A Other foreign object in bronchus causing asphyxiation, initial encounter: Secondary | ICD-10-CM | POA: Diagnosis present

## 2017-11-29 DIAGNOSIS — E872 Acidosis: Secondary | ICD-10-CM | POA: Diagnosis not present

## 2017-11-29 DIAGNOSIS — J918 Pleural effusion in other conditions classified elsewhere: Secondary | ICD-10-CM | POA: Diagnosis not present

## 2017-11-29 DIAGNOSIS — J398 Other specified diseases of upper respiratory tract: Secondary | ICD-10-CM

## 2017-11-29 DIAGNOSIS — K2270 Barrett's esophagus without dysplasia: Secondary | ICD-10-CM | POA: Diagnosis present

## 2017-11-29 DIAGNOSIS — R041 Hemorrhage from throat: Secondary | ICD-10-CM | POA: Diagnosis not present

## 2017-11-29 DIAGNOSIS — R4182 Altered mental status, unspecified: Secondary | ICD-10-CM | POA: Diagnosis present

## 2017-11-29 DIAGNOSIS — Z91048 Other nonmedicinal substance allergy status: Secondary | ICD-10-CM

## 2017-11-29 DIAGNOSIS — R0600 Dyspnea, unspecified: Secondary | ICD-10-CM

## 2017-11-29 DIAGNOSIS — Z01818 Encounter for other preprocedural examination: Secondary | ICD-10-CM

## 2017-11-29 DIAGNOSIS — I700 Atherosclerosis of aorta: Secondary | ICD-10-CM | POA: Diagnosis not present

## 2017-11-29 DIAGNOSIS — E869 Volume depletion, unspecified: Secondary | ICD-10-CM | POA: Diagnosis present

## 2017-11-29 DIAGNOSIS — R40243 Glasgow coma scale score 3-8, unspecified time: Secondary | ICD-10-CM | POA: Diagnosis present

## 2017-11-29 DIAGNOSIS — R451 Restlessness and agitation: Secondary | ICD-10-CM | POA: Diagnosis present

## 2017-11-29 DIAGNOSIS — M47814 Spondylosis without myelopathy or radiculopathy, thoracic region: Secondary | ICD-10-CM | POA: Diagnosis not present

## 2017-11-29 DIAGNOSIS — T82898A Other specified complication of vascular prosthetic devices, implants and grafts, initial encounter: Secondary | ICD-10-CM | POA: Diagnosis not present

## 2017-11-29 DIAGNOSIS — R1900 Intra-abdominal and pelvic swelling, mass and lump, unspecified site: Secondary | ICD-10-CM | POA: Diagnosis not present

## 2017-11-29 DIAGNOSIS — R4020 Unspecified coma: Secondary | ICD-10-CM | POA: Diagnosis not present

## 2017-11-29 DIAGNOSIS — R7989 Other specified abnormal findings of blood chemistry: Secondary | ICD-10-CM | POA: Diagnosis present

## 2017-11-29 DIAGNOSIS — L89159 Pressure ulcer of sacral region, unspecified stage: Secondary | ICD-10-CM | POA: Diagnosis present

## 2017-11-29 DIAGNOSIS — Z888 Allergy status to other drugs, medicaments and biological substances status: Secondary | ICD-10-CM

## 2017-11-29 DIAGNOSIS — Z4682 Encounter for fitting and adjustment of non-vascular catheter: Secondary | ICD-10-CM | POA: Diagnosis not present

## 2017-11-29 DIAGNOSIS — R319 Hematuria, unspecified: Secondary | ICD-10-CM | POA: Diagnosis present

## 2017-11-29 DIAGNOSIS — I674 Hypertensive encephalopathy: Secondary | ICD-10-CM | POA: Diagnosis not present

## 2017-11-29 DIAGNOSIS — Z86010 Personal history of colonic polyps: Secondary | ICD-10-CM

## 2017-11-29 DIAGNOSIS — Z66 Do not resuscitate: Secondary | ICD-10-CM | POA: Diagnosis present

## 2017-11-29 DIAGNOSIS — I480 Paroxysmal atrial fibrillation: Secondary | ICD-10-CM | POA: Diagnosis present

## 2017-11-29 DIAGNOSIS — Z515 Encounter for palliative care: Secondary | ICD-10-CM

## 2017-11-29 DIAGNOSIS — J9600 Acute respiratory failure, unspecified whether with hypoxia or hypercapnia: Secondary | ICD-10-CM | POA: Diagnosis not present

## 2017-11-29 DIAGNOSIS — E874 Mixed disorder of acid-base balance: Secondary | ICD-10-CM | POA: Diagnosis not present

## 2017-11-29 DIAGNOSIS — Z931 Gastrostomy status: Secondary | ICD-10-CM | POA: Diagnosis not present

## 2017-11-29 DIAGNOSIS — R400 Somnolence: Secondary | ICD-10-CM | POA: Diagnosis not present

## 2017-11-29 DIAGNOSIS — D638 Anemia in other chronic diseases classified elsewhere: Secondary | ICD-10-CM | POA: Diagnosis present

## 2017-11-29 DIAGNOSIS — I82621 Acute embolism and thrombosis of deep veins of right upper extremity: Secondary | ICD-10-CM | POA: Diagnosis not present

## 2017-11-29 DIAGNOSIS — Z0189 Encounter for other specified special examinations: Secondary | ICD-10-CM

## 2017-11-29 DIAGNOSIS — Z79899 Other long term (current) drug therapy: Secondary | ICD-10-CM

## 2017-11-29 DIAGNOSIS — E876 Hypokalemia: Secondary | ICD-10-CM | POA: Diagnosis not present

## 2017-11-29 DIAGNOSIS — R918 Other nonspecific abnormal finding of lung field: Secondary | ICD-10-CM | POA: Diagnosis not present

## 2017-11-29 DIAGNOSIS — G629 Polyneuropathy, unspecified: Secondary | ICD-10-CM | POA: Diagnosis not present

## 2017-11-29 DIAGNOSIS — K449 Diaphragmatic hernia without obstruction or gangrene: Secondary | ICD-10-CM | POA: Diagnosis present

## 2017-11-29 DIAGNOSIS — K2950 Unspecified chronic gastritis without bleeding: Secondary | ICD-10-CM | POA: Diagnosis present

## 2017-11-29 DIAGNOSIS — R9431 Abnormal electrocardiogram [ECG] [EKG]: Secondary | ICD-10-CM | POA: Diagnosis not present

## 2017-11-29 DIAGNOSIS — G4700 Insomnia, unspecified: Secondary | ICD-10-CM | POA: Diagnosis not present

## 2017-11-29 DIAGNOSIS — M5126 Other intervertebral disc displacement, lumbar region: Secondary | ICD-10-CM | POA: Diagnosis not present

## 2017-11-29 DIAGNOSIS — I9581 Postprocedural hypotension: Secondary | ICD-10-CM | POA: Diagnosis not present

## 2017-11-29 DIAGNOSIS — Z955 Presence of coronary angioplasty implant and graft: Secondary | ICD-10-CM

## 2017-11-29 DIAGNOSIS — T17908S Unspecified foreign body in respiratory tract, part unspecified causing other injury, sequela: Secondary | ICD-10-CM

## 2017-11-29 DIAGNOSIS — N400 Enlarged prostate without lower urinary tract symptoms: Secondary | ICD-10-CM | POA: Diagnosis present

## 2017-11-29 DIAGNOSIS — K5790 Diverticulosis of intestine, part unspecified, without perforation or abscess without bleeding: Secondary | ICD-10-CM | POA: Diagnosis present

## 2017-11-29 DIAGNOSIS — R627 Adult failure to thrive: Secondary | ICD-10-CM | POA: Diagnosis present

## 2017-11-29 LAB — CBC
HEMATOCRIT: 46.1 % (ref 40.0–52.0)
HEMOGLOBIN: 15.7 g/dL (ref 13.0–18.0)
MCH: 32.7 pg (ref 26.0–34.0)
MCHC: 34.1 g/dL (ref 32.0–36.0)
MCV: 95.9 fL (ref 80.0–100.0)
Platelets: 131 10*3/uL — ABNORMAL LOW (ref 150–440)
RBC: 4.8 MIL/uL (ref 4.40–5.90)
RDW: 13.1 % (ref 11.5–14.5)
WBC: 11.5 10*3/uL — ABNORMAL HIGH (ref 3.8–10.6)

## 2017-11-29 LAB — COMPREHENSIVE METABOLIC PANEL
ALBUMIN: 4.5 g/dL (ref 3.5–5.0)
ALK PHOS: 71 U/L (ref 38–126)
ALT: 30 U/L (ref 0–44)
ANION GAP: 9 (ref 5–15)
AST: 22 U/L (ref 15–41)
BILIRUBIN TOTAL: 0.7 mg/dL (ref 0.3–1.2)
BUN: 24 mg/dL — AB (ref 8–23)
CALCIUM: 9.6 mg/dL (ref 8.9–10.3)
CO2: 37 mmol/L — ABNORMAL HIGH (ref 22–32)
Chloride: 96 mmol/L — ABNORMAL LOW (ref 98–111)
Creatinine, Ser: 0.91 mg/dL (ref 0.61–1.24)
GFR calc Af Amer: >60 mL/min (ref >60)
GLUCOSE: 339 mg/dL — AB (ref 70–99)
Potassium: 4.2 mmol/L (ref 3.5–5.1)
Sodium: 142 mmol/L (ref 135–145)
TOTAL PROTEIN: 7 g/dL (ref 6.5–8.1)

## 2017-11-29 LAB — TSH: TSH: 0.529 u[IU]/mL (ref 0.350–4.500)

## 2017-11-29 LAB — TROPONIN I

## 2017-11-29 LAB — SURGICAL PATHOLOGY

## 2017-11-29 LAB — ETHANOL: Alcohol, Ethyl (B): <10 mg/dL (ref <10)

## 2017-11-29 LAB — T4, FREE: Free T4: 0.91 ng/dL (ref 0.82–1.77)

## 2017-11-29 MED ORDER — AMOXICILLIN 500 MG PO CAPS
1000.0000 mg | ORAL_CAPSULE | Freq: Once | ORAL | Status: DC
  Filled 2017-11-29: qty 2

## 2017-11-29 MED ORDER — CLARITHROMYCIN 500 MG PO TABS: 500.0000 mg | ORAL_TABLET | Freq: Two times a day (BID) | ORAL | 0 refills | Status: AC

## 2017-11-29 MED ORDER — PANTOPRAZOLE SODIUM 40 MG PO TBEC: 40.0000 mg | DELAYED_RELEASE_TABLET | Freq: Two times a day (BID) | ORAL | 0 refills | Status: AC

## 2017-11-29 MED ORDER — CLARITHROMYCIN 500 MG PO TABS
500.0000 mg | ORAL_TABLET | Freq: Two times a day (BID) | ORAL | Status: DC
  Filled 2017-11-29 (×2): qty 1

## 2017-11-29 MED ORDER — VANCOMYCIN HCL IN DEXTROSE 1-5 GM/200ML-% IV SOLN
1000.0000 mg | Freq: Once | INTRAVENOUS | Status: AC
  Administered 2017-11-29: 1000 mg via INTRAVENOUS
  Filled 2017-11-29: qty 200

## 2017-11-29 MED ORDER — PIPERACILLIN-TAZOBACTAM 3.375 G IVPB 30 MIN
3.3750 g | Freq: Once | INTRAVENOUS | Status: AC
  Administered 2017-11-29: 3.375 g via INTRAVENOUS
  Filled 2017-11-29: qty 50

## 2017-11-29 MED ORDER — PANTOPRAZOLE SODIUM 40 MG PO TBEC
40.0000 mg | DELAYED_RELEASE_TABLET | Freq: Once | ORAL | Status: DC
  Filled 2017-11-29: qty 1

## 2017-11-29 MED ORDER — AMOXICILLIN 500 MG PO TABS: 1000.0000 mg | ORAL_TABLET | Freq: Two times a day (BID) | ORAL | 0 refills | Status: AC

## 2017-11-29 MED ORDER — SODIUM CHLORIDE 0.9 % IV BOLUS
1000.0000 mL | Freq: Once | INTRAVENOUS | Status: AC
  Administered 2017-11-29: 1000 mL via INTRAVENOUS

## 2017-11-29 NOTE — ED Triage Notes (Signed)
Pt spouse reports that pt has had multiple test done over the last several months for weight loss that is unexplained - today pt has decreased appetite and increase in weakness - pt speech is unclear and hard to understand over the past few weeks - his voice has gotten soft and weak/difficult to hear - pt c/o dizziness today

## 2017-11-29 NOTE — ED Notes (Signed)
Rechecked o2 sat and it was 89% on RA - placed on 2L of O2 via Kathryn - improved to 94%

## 2017-11-29 NOTE — ED Notes (Signed)
Went in to give pt po medications ordered at 2130 and 2200. Pt is diaphoretic, not arousable to verbal stimuli. md notified and in to reexamine pt. perrl 59mm brisk. Pt cleansed of incontinent urine, rectal temp 93.4. Bear hugger placed on medium.

## 2017-11-29 NOTE — ED Provider Notes (Addendum)
[facility] Emergency Department Provider Note  ____________________________________________  Time seen: Approximately 9:26 PM  I have reviewed the triage vital signs and the nursing notes.   HISTORY  Chief Complaint Weakness and Dizziness    HPI Marc [name] is a 77 y.o. male of anxiety diverticulosis and hypertension is brought to the ED due to ongoing poor appetite, dehydration generalized weakness and weight loss this been progressing over the past 5 months.  He is lost a total of about 30 pounds over this time.  He had an extensive outpatient work-up including CT scan of the abdomen and pelvis, MRI of the T and L-spine, and finally an upper endoscopy 2 days ago.  By Dr. Vira Agar.  Patient has not received the results of this endoscopy yet.  I reviewed the electronic medical record and the endoscopy report does show short segment Barrett's esophagus along with gastritis which on pathology is shown to be H. pylori associated chronic gastritis.  No trouble with balance or coordination.  No word finding difficulty or paresthesias or lateralizing weakness.      Past Medical History:  Diagnosis Date  . Anxiety   . BPH (benign prostatic hyperplasia)   . Colon adenomas    history of  . Diverticulosis   . Hyperlipidemia   . Hypertension      Patient Active Problem List   Diagnosis Date Noted  . Hypertensive urgency 02/16/2015  . Vertigo 02/16/2015     Past Surgical History:  Procedure Laterality Date  . CHOLECYSTECTOMY    . COLONOSCOPY WITH PROPOFOL N/A 01/23/2016   Procedure: COLONOSCOPY WITH PROPOFOL;  Surgeon: Manya Silvas, MD;  Location: [facility] ENDOSCOPY;  Service: Endoscopy;  Laterality: N/A;  . ESOPHAGOGASTRODUODENOSCOPY (EGD) WITH PROPOFOL N/A 11/27/2017   Procedure: ESOPHAGOGASTRODUODENOSCOPY (EGD) WITH PROPOFOL;  Surgeon: Manya Silvas, MD;  Location: [facility] ENDOSCOPY;  Service: Endoscopy;  Laterality: N/A;     Prior to Admission  medications   Medication Sig Start Date End Date Taking? Authorizing Provider  carvedilol (COREG) 25 MG tablet Take 25 mg by mouth 2 (two) times daily with a meal.   Yes [provider]  imipramine (TOFRANIL) 50 MG tablet Take 50 mg by mouth at bedtime.   Yes [provider]  megestrol (MEGACE) 40 MG/ML suspension Take 400 mg by mouth 2 (two) times daily.    Yes [provider]  amLODipine (NORVASC) 5 MG tablet Take 1 tablet (5 mg total) by mouth daily. Patient not taking: Reported on 11/29/2017 02/18/15   Loletha Grayer, MD  amoxicillin (AMOXIL) 500 MG tablet Take 2 tablets (1,000 mg total) by mouth 2 (two) times daily for 14 days. 11/29/17 12/13/17  Carrie Mew, MD  carbamide peroxide [facility]) 6.5 % otic solution Place 5 drops into the right ear 2 (two) times daily. Patient not taking: Reported on 11/29/2017 02/18/15   Loletha Grayer, MD  clarithromycin (BIAXIN) 500 MG tablet Take 1 tablet (500 mg total) by mouth 2 (two) times daily for 14 days. 11/29/17 12/13/17  Carrie Mew, MD  meclizine (ANTIVERT) 25 MG tablet Take 1 tablet (25 mg total) by mouth 3 (three) times daily. Patient not taking: Reported on 11/29/2017 02/18/15   Loletha Grayer, MD  pantoprazole (PROTONIX) 40 MG tablet Take 1 tablet (40 mg total) by mouth 2 (two) times daily for 14 days. 11/29/17 12/13/17  Carrie Mew, MD     Allergies Glimepiride and Tape   Family History  Problem Relation Age of Onset  . Hypertension  Other   . Diabetes Neg Hx     Social History Social History   Tobacco Use  . Smoking status: Never Smoker  . Smokeless tobacco: Never Used  Substance Use Topics  . Alcohol use: No  . Drug use: No    Review of Systems  Constitutional:   No fever or chills.  ENT:   Chronic rhinorrhea. Cardiovascular:   No chest pain or syncope. Respiratory:   No dyspnea or cough. Gastrointestinal:   Negative for abdominal pain, vomiting and diarrhea.  Musculoskeletal:    Negative for focal pain or swelling All other systems reviewed and are negative except as documented above in ROS and HPI.  ____________________________________________   PHYSICAL EXAM:  VITAL SIGNS: ED Triage Vitals  Enc Vitals Group     BP 11/29/17 1646 (!) 153/102     Pulse Rate 11/29/17 1646 60     Resp 11/29/17 1646 15     Temp 11/29/17 1646 98 F (36.7 C)     Temp Source 11/29/17 1646 Oral     SpO2 11/29/17 1646 90 %     Weight 11/29/17 1647 128 lb (58.1 kg)     Height 11/29/17 1647 5\' 7"  (1.702 m)     Head Circumference --      Peak Flow --      Pain Score 11/29/17 1647 0     Pain Loc --      Pain Edu? --      Excl. in [place]? --     Vital signs reviewed, nursing assessments reviewed.   Constitutional:   Alert and oriented. Non-toxic appearance. Eyes:   Conjunctivae are normal. EOMI. PERRL. ENT      Head:   Normocephalic and atraumatic.      Nose:   No congestion/rhinnorhea.       Mouth/Throat:   Dry mucous membranes, no pharyngeal erythema. No peritonsillar mass.       Neck:   No meningismus. Full ROM. Hematological/Lymphatic/Immunilogical:   No cervical lymphadenopathy. Cardiovascular:   RRR. Symmetric bilateral radial and DP pulses.  No murmurs. Cap refill less than 2 seconds. Respiratory:   Normal respiratory effort without tachypnea/retractions. Breath sounds are clear and equal bilaterally. No wheezes/rales/rhonchi. Gastrointestinal:   Soft and nontender. Non distended. There is no CVA tenderness.  No rebound, rigidity, or guarding. Musculoskeletal:   Normal range of motion in all extremities. No joint effusions.  No lower extremity tenderness.  No edema. Neurologic:   Normal speech and language.  Motor grossly intact. No acute focal neurologic deficits are appreciated.  Skin:    Skin is warm, dry and intact. No rash noted.  No petechiae, purpura, or bullae.  ____________________________________________    LABS (pertinent positives/negatives) (all labs  ordered are listed, but only abnormal results are displayed) Labs Reviewed  CBC - Abnormal; Notable for the following components:      Result Value   WBC 11.5 (*)    Platelets 131 (*)    All other components within normal limits  COMPREHENSIVE METABOLIC PANEL - Abnormal; Notable for the following components:   Chloride 96 (*)    CO2 37 (*)    Glucose, Bld 339 (*)    BUN 24 (*)    All other components within normal limits  URINE CULTURE  CULTURE, BLOOD (ROUTINE X 2)  CULTURE, BLOOD (ROUTINE X 2)  TROPONIN I  ETHANOL  TSH  T4, FREE  URINALYSIS, COMPLETE (UACMP) WITH MICROSCOPIC  URINE DRUG SCREEN, QUALITATIVE ([facility] ONLY)  LACTIC ACID, PLASMA  LACTIC ACID, PLASMA  ACETYLCHOLINE RECEPTOR AB, ALL  VITAMIN B12  FOLATE  AMMONIA  RPR  HEMOGLOBIN A1C  RAPID HIV SCREEN (HIV 1/2 AB+AG)  PROCALCITONIN  PROCALCITONIN   ____________________________________________   EKG  Interpreted by me Sinus rhythm rate of 68, normal axis and intervals.  Normal QRS.  Normal ST segments.  T wave inversions in anterior and lateral leads.    Repeat EKG without evolving changes, persistent T wave inversions in anterior and lateral leads with a sinus rhythm rate of 80 normal intervals.  Last comparison EKG is from 2012. ____________________________________________    RADIOLOGY  Ct Head Wo Contrast  Result Date: 11/29/2017 CLINICAL DATA:  Unexplained weight loss for several months. Decreased appetite and increasing weakness with unclear speech. Dizziness today. EXAM: CT HEAD WITHOUT CONTRAST TECHNIQUE: Contiguous axial images were obtained from the base of the skull through the vertex without intravenous contrast. COMPARISON:  Head CT and MRI brain 02/16/2015. FINDINGS: Brain: There is no evidence of acute intracranial hemorrhage, mass lesion, brain edema or extra-axial fluid collection. The ventricles and subarachnoid spaces are appropriately sized for age. There is no CT evidence of acute cortical  infarction. Stable minimal periventricular white matter disease. Small lacune or perivascular space in the left lentiform nucleus. Vascular: Mild intracranial vascular calcifications. No hyperdense vessel identified. Skull: Negative for fracture or focal lesion. Sinuses/Orbits: The visualized paranasal sinuses and mastoid air cells are clear. No orbital abnormalities are seen. Other: None. IMPRESSION: No acute intracranial findings. No explanation for the patient's symptoms. Electronically Signed   By: Richardean Sale M.D.   On: 11/29/2017 18:27    ____________________________________________   PROCEDURES .Critical Care Performed by: Carrie Mew, MD Authorized by: Carrie Mew, MD   Critical care provider statement:    Critical care time (minutes):  30   Critical care time was exclusive of:  Separately billable procedures and treating other patients   Critical care was necessary to treat or prevent imminent or life-threatening deterioration of the following conditions:  CNS failure or compromise   Critical care was time spent personally by me on the following activities:  Development of treatment plan with patient or surrogate, discussions with consultants, evaluation of patient's response to treatment, examination of patient, obtaining history from patient or surrogate, ordering and performing treatments and interventions, ordering and review of laboratory studies, ordering and review of radiographic studies, pulse oximetry, re-evaluation of patient's condition and review of old charts    ____________________________________________  DIFFERENTIAL DIAGNOSIS   Dehydration, electrolytic disturbance, intracranial mass or hemorrhage, non-STEMI  CLINICAL IMPRESSION / ASSESSMENT AND PLAN / ED COURSE  Pertinent labs & imaging results that were available during my care of the patient were reviewed by me and considered in my medical decision making (see chart for details).       Clinical Course as of Dec 01 19  Fri Nov 29, 2017  1735 Total Protein: 7.0 [PS]  9390 Labs suggest mild dehydration with contraction alkalosis. . Reviewed EMR. Recent egd shows gastritis with h-pylori revealed on pathology. Will check CT head due to vague sx that could have a CNS cause. Otherwise likely due to gastritis.    [PS]  2028 Ct head negative. Will DC home to f/u with GI.    [PS]  2228 While preparing for discharge, it's noted that patient is still not arousable.  With stimulation and shoulting his name "Oisin" he opens his eyes briefly. He resists arm movement, but can not purposefully respond.  Unclear why given normal VS, unremarakble workup. No evidence of intoxication. Will plan to hospitalize for further monitoring and evaluation. He is hypothermic to 93.4.  Evolving clinical picture is suggestive of underlying infection. Does not meet sepsis criteria, but will give empiric vancomycin and zosyn. Check blood cultures and lactate.    [PS]    Clinical Course User Index [PS] Carrie Mew, MD    ----------------------------------------- 9:29 PM on 11/29/2017 -----------------------------------------  Labs unremarkable.  Case discussed with Dr. Gustavo Lah who recommends amoxicillin clarithromycin and pantoprazole and follow-up on Monday.  Troponin negative.  No further cardiac work-up at this time.   ----------------------------------------- 11:47 PM on 11/29/2017 -----------------------------------------  Attempted to contact family to update them on need for hospitalization and further work-up.  Empiric antibiotics.  Blood cultures.  Unfortunately there is no answer to my phone call.  I discussed with the case with the hospitalist ____________________________________________   FINAL CLINICAL IMPRESSION(S) / ED DIAGNOSES    Final diagnoses:  Helicobacter pylori gastritis  Dehydration  Generalized weakness  Glasgow coma scale total score 3-8, at hospital  admission [facility])     ED Discharge Orders        Ordered    amoxicillin (AMOXIL) 500 MG tablet  2 times daily     11/29/17 2125    clarithromycin (BIAXIN) 500 MG tablet  2 times daily     11/29/17 2125    pantoprazole (PROTONIX) 40 MG tablet  2 times daily     11/29/17 2125      Portions of this note were generated with dragon dictation software. Dictation errors may occur despite best attempts at proofreading.    Carrie Mew, MD 11/29/17 2130    Carrie Mew, MD 11/29/17 Arnoldo Lenis    Carrie Mew, MD 11/30/17 Greer Pickerel

## 2017-11-29 NOTE — Discharge Instructions (Addendum)
Your lab tests and CT scan of the brain did not show any serious issues today. Take medications as prescribed to treat the infection in your stomach and call Dr. Percell Boston office first thing on Monday.

## 2017-11-30 ENCOUNTER — Inpatient Hospital Stay: Payer: PPO

## 2017-11-30 ENCOUNTER — Encounter: Payer: Self-pay | Admitting: Internal Medicine

## 2017-11-30 ENCOUNTER — Other Ambulatory Visit: Payer: Self-pay

## 2017-11-30 ENCOUNTER — Inpatient Hospital Stay: Payer: Self-pay

## 2017-11-30 DIAGNOSIS — R4182 Altered mental status, unspecified: Secondary | ICD-10-CM

## 2017-11-30 LAB — VITAMIN B12: VITAMIN B 12: 405 pg/mL (ref 180–914)

## 2017-11-30 LAB — BASIC METABOLIC PANEL
Anion gap: 8 (ref 5–15)
BUN: 22 mg/dL (ref 8–23)
CALCIUM: 9.2 mg/dL (ref 8.9–10.3)
CO2: 37 mmol/L — ABNORMAL HIGH (ref 22–32)
Chloride: 101 mmol/L (ref 98–111)
Creatinine, Ser: 0.9 mg/dL (ref 0.61–1.24)
GFR calc Af Amer: >60 mL/min (ref >60)
GLUCOSE: 257 mg/dL — AB (ref 70–99)
POTASSIUM: 4.9 mmol/L (ref 3.5–5.1)
Sodium: 146 mmol/L — ABNORMAL HIGH (ref 135–145)

## 2017-11-30 LAB — URINALYSIS, COMPLETE (UACMP) WITH MICROSCOPIC
BILIRUBIN URINE: NEGATIVE
Glucose, UA: >=500 mg/dL — AB
KETONES UR: NEGATIVE mg/dL
LEUKOCYTES UA: NEGATIVE
Nitrite: NEGATIVE
Protein, ur: 30 mg/dL — AB
SPECIFIC GRAVITY, URINE: 1.023 (ref 1.005–1.030)
pH: 5 (ref 5.0–8.0)

## 2017-11-30 LAB — PREALBUMIN: Prealbumin: 23 mg/dL (ref 18–38)

## 2017-11-30 LAB — RAPID HIV SCREEN (HIV 1/2 AB+AG)
HIV 1/2 Antibodies: NONREACTIVE
HIV-1 P24 ANTIGEN - HIV24: NONREACTIVE

## 2017-11-30 LAB — URINE DRUG SCREEN, QUALITATIVE (ARMC ONLY)
Amphetamines, Ur Screen: NOT DETECTED
BARBITURATES, UR SCREEN: NOT DETECTED
CANNABINOID 50 NG, UR ~~LOC~~: NOT DETECTED
COCAINE METABOLITE, UR ~~LOC~~: NOT DETECTED
MDMA (Ecstasy)Ur Screen: NOT DETECTED
METHADONE SCREEN, URINE: NOT DETECTED
OPIATE, UR SCREEN: NOT DETECTED
Phencyclidine (PCP) Ur S: NOT DETECTED
TRICYCLIC, UR SCREEN: NOT DETECTED

## 2017-11-30 LAB — LACTIC ACID, PLASMA
LACTIC ACID, VENOUS: 1 mmol/L (ref 0.5–1.9)
Lactic Acid, Venous: 0.8 mmol/L (ref 0.5–1.9)

## 2017-11-30 LAB — GLUCOSE, CAPILLARY
GLUCOSE-CAPILLARY: 135 mg/dL — AB (ref 70–99)
GLUCOSE-CAPILLARY: 149 mg/dL — AB (ref 70–99)
Glucose-Capillary: 176 mg/dL — ABNORMAL HIGH (ref 70–99)
Glucose-Capillary: 192 mg/dL — ABNORMAL HIGH (ref 70–99)
Glucose-Capillary: 181 mg/dL — ABNORMAL HIGH (ref 70–99)
Glucose-Capillary: 183 mg/dL — ABNORMAL HIGH (ref 70–99)

## 2017-11-30 LAB — PROCALCITONIN

## 2017-11-30 LAB — MAGNESIUM: Magnesium: 2 mg/dL (ref 1.7–2.4)

## 2017-11-30 LAB — PHOSPHORUS: PHOSPHORUS: 6.5 mg/dL — AB (ref 2.5–4.6)

## 2017-11-30 LAB — FOLATE: FOLATE: 18.7 ng/mL (ref >5.9)

## 2017-11-30 LAB — HEMOGLOBIN A1C
Hgb A1c MFr Bld: 7.4 % — ABNORMAL HIGH (ref 4.8–5.6)
Mean Plasma Glucose: 165.68 mg/dL

## 2017-11-30 LAB — TRIGLYCERIDES: Triglycerides: 141 mg/dL (ref <150)

## 2017-11-30 LAB — STREP PNEUMONIAE URINARY ANTIGEN: Strep Pneumo Urinary Antigen: NEGATIVE

## 2017-11-30 LAB — AMMONIA: Ammonia: 39 umol/L — ABNORMAL HIGH (ref 9–35)

## 2017-11-30 LAB — MRSA PCR SCREENING: MRSA by PCR: NEGATIVE

## 2017-11-30 MED ORDER — CHLORHEXIDINE GLUCONATE 0.12% ORAL RINSE (MEDLINE KIT)
15.0000 mL | Freq: Two times a day (BID) | OROMUCOSAL | Status: DC
  Administered 2017-11-30 – 2017-12-05 (×10): 15 mL via OROMUCOSAL

## 2017-11-30 MED ORDER — NOREPINEPHRINE 4 MG/250ML-% IV SOLN
0.0000 ug/min | INTRAVENOUS | Status: DC
Start: 2017-11-30 — End: 2017-12-06
  Administered 2017-11-30: 4 ug/min via INTRAVENOUS
  Administered 2017-12-01: 5 ug/min via INTRAVENOUS
  Administered 2017-12-02: 2 ug/min via INTRAVENOUS
  Administered 2017-12-03: 7 ug/min via INTRAVENOUS
  Filled 2017-11-30 (×4): qty 250

## 2017-11-30 MED ORDER — ONDANSETRON HCL 4 MG/2ML IJ SOLN: 4.0000 mg | Freq: Four times a day (QID) | INTRAMUSCULAR | Status: DC | PRN

## 2017-11-30 MED ORDER — ORAL CARE MOUTH RINSE
15.0000 mL | OROMUCOSAL | Status: DC
  Administered 2017-11-30 – 2017-12-05 (×42): 15 mL via OROMUCOSAL

## 2017-11-30 MED ORDER — SENNOSIDES-DOCUSATE SODIUM 8.6-50 MG PO TABS: 1.0000 | ORAL_TABLET | Freq: Every evening | ORAL | Status: DC | PRN

## 2017-11-30 MED ORDER — IOHEXOL 300 MG/ML  SOLN
75.0000 mL | Freq: Once | INTRAMUSCULAR | Status: AC | PRN
  Administered 2017-11-30: 75 mL via INTRAVENOUS

## 2017-11-30 MED ORDER — MIDAZOLAM HCL 5 MG/5ML IJ SOLN
2.0000 mg | Freq: Once | INTRAMUSCULAR | Status: AC
  Administered 2017-11-30: 2 mg via INTRAVENOUS

## 2017-11-30 MED ORDER — DEXTROSE 5 % IV SOLN
10.0000 mg/kg | Freq: Three times a day (TID) | INTRAVENOUS | Status: DC
  Administered 2017-11-30 – 2017-12-01 (×3): 565 mg via INTRAVENOUS
  Filled 2017-11-30 (×6): qty 11.3

## 2017-11-30 MED ORDER — SODIUM CHLORIDE 0.9 % IV BOLUS
500.0000 mL | Freq: Once | INTRAVENOUS | Status: AC
  Administered 2017-11-30: 500 mL via INTRAVENOUS

## 2017-11-30 MED ORDER — ENOXAPARIN SODIUM 40 MG/0.4ML ~~LOC~~ SOLN
40.0000 mg | SUBCUTANEOUS | Status: DC
  Administered 2017-11-30 – 2017-12-03 (×3): 40 mg via SUBCUTANEOUS
  Filled 2017-11-30 (×3): qty 0.4

## 2017-11-30 MED ORDER — AMOXICILLIN 500 MG PO CAPS
1000.0000 mg | ORAL_CAPSULE | Freq: Two times a day (BID) | ORAL | Status: DC
  Filled 2017-11-30: qty 2

## 2017-11-30 MED ORDER — ONDANSETRON HCL 4 MG PO TABS: 4.0000 mg | ORAL_TABLET | Freq: Four times a day (QID) | ORAL | Status: DC | PRN

## 2017-11-30 MED ORDER — ETOMIDATE 2 MG/ML IV SOLN
10.0000 mg | Freq: Once | INTRAVENOUS | Status: AC
  Administered 2017-11-30: 10 mg via INTRAVENOUS

## 2017-11-30 MED ORDER — SODIUM CHLORIDE 0.9 % IV SOLN
2.0000 g | INTRAVENOUS | Status: DC
  Administered 2017-11-30 – 2017-12-01 (×5): 2 g via INTRAVENOUS
  Filled 2017-11-30 (×2): qty 2000
  Filled 2017-11-30: qty 2
  Filled 2017-11-30 (×2): qty 2000
  Filled 2017-11-30 (×2): qty 2
  Filled 2017-11-30: qty 2000
  Filled 2017-11-30: qty 2

## 2017-11-30 MED ORDER — PROPOFOL 1000 MG/100ML IV EMUL
5.0000 ug/kg/min | INTRAVENOUS | Status: DC
  Administered 2017-11-30: 5 ug/kg/min via INTRAVENOUS
  Administered 2017-11-30: 10 ug/kg/min via INTRAVENOUS
  Administered 2017-12-01: 50 ug/kg/min via INTRAVENOUS
  Administered 2017-12-01: 70 ug/kg/min via INTRAVENOUS
  Administered 2017-12-02: 40 ug/kg/min via INTRAVENOUS
  Administered 2017-12-02: 50 ug/kg/min via INTRAVENOUS
  Administered 2017-12-03: 25 ug/kg/min via INTRAVENOUS
  Administered 2017-12-03: 50 ug/kg/min via INTRAVENOUS
  Administered 2017-12-03: 40 ug/kg/min via INTRAVENOUS
  Administered 2017-12-03: 50 ug/kg/min via INTRAVENOUS
  Administered 2017-12-04: 20 ug/kg/min via INTRAVENOUS
  Administered 2017-12-04: 40 ug/kg/min via INTRAVENOUS
  Administered 2017-12-04 – 2017-12-05 (×2): 30 ug/kg/min via INTRAVENOUS
  Filled 2017-11-30 (×13): qty 100
  Filled 2017-11-30: qty 200
  Filled 2017-11-30 (×2): qty 100

## 2017-11-30 MED ORDER — SUCCINYLCHOLINE CHLORIDE 20 MG/ML IJ SOLN
120.0000 mg | Freq: Once | INTRAMUSCULAR | Status: AC
  Administered 2017-11-30: 120 mg via INTRAVENOUS

## 2017-11-30 MED ORDER — VANCOMYCIN HCL IN DEXTROSE 750-5 MG/150ML-% IV SOLN
750.0000 mg | Freq: Two times a day (BID) | INTRAVENOUS | Status: DC
  Filled 2017-11-30 (×2): qty 150

## 2017-11-30 MED ORDER — NOREPINEPHRINE 4 MG/250ML-% IV SOLN
0.0000 ug/min | Freq: Once | INTRAVENOUS | Status: AC
  Administered 2017-11-30: 2 ug/min via INTRAVENOUS

## 2017-11-30 MED ORDER — ACETAMINOPHEN 650 MG RE SUPP
650.0000 mg | Freq: Four times a day (QID) | RECTAL | Status: DC | PRN
  Administered 2017-11-30: 650 mg via RECTAL
  Filled 2017-11-30: qty 1

## 2017-11-30 MED ORDER — CARVEDILOL 25 MG PO TABS: 25.0000 mg | ORAL_TABLET | Freq: Two times a day (BID) | ORAL | Status: DC

## 2017-11-30 MED ORDER — INSULIN ASPART 100 UNIT/ML ~~LOC~~ SOLN
0.0000 [IU] | Freq: Three times a day (TID) | SUBCUTANEOUS | Status: DC
  Administered 2017-11-30 (×2): 2 [IU] via SUBCUTANEOUS
  Filled 2017-11-30 (×2): qty 1

## 2017-11-30 MED ORDER — VANCOMYCIN HCL IN DEXTROSE 1-5 GM/200ML-% IV SOLN
1000.0000 mg | INTRAVENOUS | Status: DC
  Administered 2017-11-30 – 2017-12-01 (×2): 1000 mg via INTRAVENOUS
  Filled 2017-11-30 (×3): qty 200

## 2017-11-30 MED ORDER — SODIUM CHLORIDE 0.9 % IV SOLN
INTRAVENOUS | Status: DC | PRN
  Administered 2017-11-30 (×2): 250 mL via INTRAVENOUS
  Administered 2017-12-03: 500 mL via INTRAVENOUS
  Administered 2017-12-07: 250 mL via INTRAVENOUS
  Administered 2017-12-25: 1000 mL via INTRAVENOUS
  Administered 2017-12-28: 250 mL via INTRAVENOUS

## 2017-11-30 MED ORDER — SODIUM CHLORIDE 0.45 % IV SOLN
INTRAVENOUS | Status: DC
Start: 2017-11-30 — End: 2017-12-02
  Administered 2017-11-30 – 2017-12-01 (×2): via INTRAVENOUS

## 2017-11-30 MED ORDER — PIPERACILLIN-TAZOBACTAM 3.375 G IVPB
3.3750 g | Freq: Three times a day (TID) | INTRAVENOUS | Status: DC
  Administered 2017-11-30: 3.375 g via INTRAVENOUS
  Filled 2017-11-30: qty 50

## 2017-11-30 MED ORDER — IPRATROPIUM-ALBUTEROL 0.5-2.5 (3) MG/3ML IN SOLN
3.0000 mL | Freq: Four times a day (QID) | RESPIRATORY_TRACT | Status: DC
  Administered 2017-11-30 – 2017-12-05 (×21): 3 mL via RESPIRATORY_TRACT
  Filled 2017-11-30 (×21): qty 3

## 2017-11-30 MED ORDER — PANTOPRAZOLE SODIUM 40 MG IV SOLR
40.0000 mg | Freq: Two times a day (BID) | INTRAVENOUS | Status: DC
  Administered 2017-11-30 – 2017-12-01 (×3): 40 mg via INTRAVENOUS
  Filled 2017-11-30 (×3): qty 40

## 2017-11-30 MED ORDER — INSULIN ASPART 100 UNIT/ML ~~LOC~~ SOLN
0.0000 [IU] | SUBCUTANEOUS | Status: DC
  Administered 2017-11-30: 2 [IU] via SUBCUTANEOUS
  Administered 2017-12-01: 3 [IU] via SUBCUTANEOUS
  Administered 2017-12-01: 1 [IU] via SUBCUTANEOUS
  Administered 2017-12-01 (×2): 2 [IU] via SUBCUTANEOUS
  Administered 2017-12-01: 1 [IU] via SUBCUTANEOUS
  Administered 2017-12-02: 2 [IU] via SUBCUTANEOUS
  Administered 2017-12-02: 1 [IU] via SUBCUTANEOUS
  Administered 2017-12-02 (×4): 2 [IU] via SUBCUTANEOUS
  Administered 2017-12-02: 3 [IU] via SUBCUTANEOUS
  Administered 2017-12-03: 2 [IU] via SUBCUTANEOUS
  Administered 2017-12-03: 1 [IU] via SUBCUTANEOUS
  Administered 2017-12-03: 3 [IU] via SUBCUTANEOUS
  Administered 2017-12-03: 1 [IU] via SUBCUTANEOUS
  Administered 2017-12-03 (×2): 2 [IU] via SUBCUTANEOUS
  Administered 2017-12-04 (×2): 1 [IU] via SUBCUTANEOUS
  Administered 2017-12-04 (×2): 3 [IU] via SUBCUTANEOUS
  Administered 2017-12-04: 2 [IU] via SUBCUTANEOUS
  Administered 2017-12-05: 1 [IU] via SUBCUTANEOUS
  Administered 2017-12-05: 2 [IU] via SUBCUTANEOUS
  Administered 2017-12-05: 1 [IU] via SUBCUTANEOUS
  Administered 2017-12-06: 5 [IU] via SUBCUTANEOUS
  Administered 2017-12-06 (×3): 1 [IU] via SUBCUTANEOUS
  Administered 2017-12-06: 2 [IU] via SUBCUTANEOUS
  Administered 2017-12-07: 1 [IU] via SUBCUTANEOUS
  Administered 2017-12-07: 3 [IU] via SUBCUTANEOUS
  Filled 2017-11-30 (×32): qty 1

## 2017-11-30 MED ORDER — ACETAMINOPHEN 325 MG PO TABS
650.0000 mg | ORAL_TABLET | Freq: Four times a day (QID) | ORAL | Status: DC | PRN
  Filled 2017-11-30: qty 2

## 2017-11-30 MED ORDER — SODIUM CHLORIDE 0.9 % IV SOLN
2.0000 mg/h | INTRAVENOUS | Status: DC
  Administered 2017-11-30: 2 mg/h via INTRAVENOUS
  Filled 2017-11-30 (×3): qty 10

## 2017-11-30 MED ORDER — BISACODYL 5 MG PO TBEC
5.0000 mg | DELAYED_RELEASE_TABLET | Freq: Every day | ORAL | Status: DC | PRN
  Filled 2017-11-30: qty 1

## 2017-11-30 MED ORDER — NOREPINEPHRINE 4 MG/250ML-% IV SOLN: 0.0000 ug/min | Freq: Once | INTRAVENOUS | Status: DC

## 2017-11-30 MED ORDER — SODIUM CHLORIDE 0.9 % IV SOLN
2.0000 g | Freq: Two times a day (BID) | INTRAVENOUS | Status: DC
  Administered 2017-11-30 – 2017-12-02 (×6): 2 g via INTRAVENOUS
  Filled 2017-11-30: qty 20
  Filled 2017-11-30 (×3): qty 2
  Filled 2017-11-30: qty 20
  Filled 2017-11-30 (×3): qty 2

## 2017-11-30 MED ORDER — CLARITHROMYCIN 500 MG PO TABS
500.0000 mg | ORAL_TABLET | Freq: Two times a day (BID) | ORAL | Status: DC
  Filled 2017-11-30 (×2): qty 1

## 2017-11-30 MED ORDER — PANTOPRAZOLE SODIUM 40 MG PO TBEC: 40.0000 mg | DELAYED_RELEASE_TABLET | Freq: Two times a day (BID) | ORAL | Status: DC

## 2017-11-30 MED FILL — Medication: Qty: 1 | Status: AC

## 2017-11-30 NOTE — ED Notes (Signed)
Bair hugger discontinued.  

## 2017-11-30 NOTE — Progress Notes (Signed)
Dr. Jefferson Fuel updated patient's wife and daughter at bedside. [facility] called and notified of need for PICC line per MD order.

## 2017-11-30 NOTE — ED Notes (Signed)
Lab called to add on BMP to labs already drawn this morning. Spoke with Helene Kelp

## 2017-11-30 NOTE — ED Provider Notes (Signed)
Procedure Name: Intubation Date/Time: 11/30/2017 9:24 AM Performed by: Delman Kitten, MD Pre-anesthesia Checklist: Patient identified, Patient being monitored, Emergency Drugs available, Timeout performed and Suction available Oxygen Delivery Method: Non-rebreather mask Preoxygenation: Pre-oxygenation with 100% oxygen Induction Type: Rapid sequence Ventilation: Mask ventilation without difficulty Laryngoscope Size: Glidescope and 4 Grade View: Grade I Tube type: Subglottic suction tube Tube size: 7.5 mm Number of attempts: 1 Placement Confirmation: ETT inserted through vocal cords under direct vision,  CO2 detector and Breath sounds checked- equal and bilateral      ----------------------------------------- 9:27 AM on 11/30/2017 -----------------------------------------  Post intubation initial blood pressure normotensive, has dropped his blood pressure down to about 75 systolic.  Heart rate remains in the 70s.  At this point, I have ordered a fluid bolus and will also start the patient on peripheral levophed for blood pressure support.  Suspect possibly hypotension secondary to volume depletion, septic septic shock, post intubation response to etomidate.  Patient was secured airway, chest x-Comer completed.  Have called and re-paged critical care medicine physician, have not received a call back yet over about the last 45 minutes.  I have spoken with Dr. Genia Harold, have updated her on patient condition.  CRITICAL CARE Performed by: Delman Kitten   Total critical care time: 40 minutes  Critical care time was exclusive of separately billable procedures and treating other patients.  Critical care was necessary to treat or prevent imminent or life-threatening deterioration.  Critical care was time spent personally by me on the following activities: development of treatment plan with patient and/or surrogate as well as nursing, discussions with consultants, evaluation of patient's response to  treatment, examination of patient, obtaining history from patient or surrogate, ordering and performing treatments and interventions, ordering and review of laboratory studies, ordering and review of radiographic studies, pulse oximetry and re-evaluation of patient's condition.    Delman Kitten, MD 11/30/17 [phone]

## 2017-11-30 NOTE — ED Notes (Signed)
Upon arrival to room, pt had pulled out both IVs and was turned on side with rectal temp misplaced. Temp foley was inserted by this nurse and Wells Guiles, RN.

## 2017-11-30 NOTE — Progress Notes (Signed)
Admission completed with patient spouse. Patient NPO, SS Insulin given as ordered. Patient mildly afebrile- MD aware and rectal Tylenol given per order per MD. Patient to go to MRI tomorrow at noon for completion of MRI due to conflict with PICC insertion scheduling, Dr. Jefferson Fuel aware and ok with patient receiving MRI tomorrow.  Telephone Consent obtained for PICC from patient's spouse, Peggy- verfied with PICC RN Willaim Bane. Patient desated this evening to high 70's- MD made aware and stat C X-Leyland ordered. Per MD order, Versed stopped and Propofol started for sedation. Patient still on Levophed for maintenance of blood pressure, MAP's greater than 65 per MD. CHG bath given, full linen change with peri care performed.

## 2017-11-30 NOTE — ED Notes (Signed)
Per Dr. Jacqualine Code, change pt to Marc [name] on Levophed due to improved blood pressures. Informed Dr. Jacqualine Code concerned pt may be having possible seizures due to shaking and stiffening. Dr. Jacqualine Code in room to assess pt states the shaking may be related to the sedation used during intubation.  Received order for 2mg  Versed IVP and started Versed drip.

## 2017-11-30 NOTE — Progress Notes (Signed)
Patient's O2 Sats dropped to high 70's- Patient suctioned and evaluated.  RT to bedside to evaluate venitalor and adjust settings. MD, Confort[place] notified, order received for stat C-Xray

## 2017-11-30 NOTE — ED Notes (Signed)
Primary RN cole notified of change in pt status and temperature.

## 2017-11-30 NOTE — H&P (Signed)
[facility] [place] NAME: Marc [name]    MR#:  [id number]  DATE OF BIRTH:  09-Dec-1940  DATE OF ADMISSION:  11/29/2017  PRIMARY CARE PHYSICIAN: Tracie Harrier, MD   REQUESTING/REFERRING PHYSICIAN: Carrie Mew, MD  CHIEF COMPLAINT:   Chief Complaint  Patient presents with  . Weakness  . Dizziness    HISTORY OF PRESENT ILLNESS:  Marc [name]  is a 77 y.o. male with a known history of HTN, HLD, BPH p/w AMS, lethargy, generalized weakness. Pt is not verbally responsive at the time of my assessment. His eyes are closed. He opens his eyes transiently when you call his first name loudly, but closes them again. He is restless/agitated, but his movements are purposeful (interfering with treatment/devices). He has pulled out multiple IVs, and has attempted to grab his Foley catheter. He is not decorticating/decerebrating. He appears to be protecting his airway. He is taking rapid shallow breaths. He does not appear have any obvious gross focal neurological deficits with specific regards to motor strength and cranial nerve function. He has fairly impressive muscle strength. He does not follow instructions. He is non-tachycardic but hypothermic. His lung sounds are diminished due to poor respiratory effort. He does not appear to be in distress.  His family is at bedside, and tells me that pt has been undergoing workup x54mo for an unintentional 30-40lb weight loss, w/ decreased appetite, poor PO intake of food and fluids, dehydration. His workup has included CT A/P, MRI T-spine + L-spine, and most recently an EGD on Wednesday 07/31. EGD (+) Barrett's Esophagus, stomach antrum/body Bx (+) H. Pylori gastritis. During the day, pt had apparently c/o "weakness" and "dizziness". CT head performed in ED (-) acute intracranial abnl. Pt was intended for D/C home, but became poorly responsive in ED. His condition is described above. He is not able to provide any Hx  or ROS. He has been covered w/ broad spectrum ABx, and will be admitted for suspected sepsis. He does not have any recent inpatient hospitalizations.  PAST MEDICAL HISTORY:   Past Medical History:  Diagnosis Date  . Anxiety   . BPH (benign prostatic hyperplasia)   . Colon adenomas    history of  . Diverticulosis   . Hyperlipidemia   . Hypertension     PAST SURGICAL HISTORY:   Past Surgical History:  Procedure Laterality Date  . CHOLECYSTECTOMY    . COLONOSCOPY WITH PROPOFOL N/A 01/23/2016   Procedure: COLONOSCOPY WITH PROPOFOL;  Surgeon: Manya Silvas, MD;  Location: [facility] ENDOSCOPY;  Service: Endoscopy;  Laterality: N/A;  . ESOPHAGOGASTRODUODENOSCOPY (EGD) WITH PROPOFOL N/A 11/27/2017   Procedure: ESOPHAGOGASTRODUODENOSCOPY (EGD) WITH PROPOFOL;  Surgeon: Manya Silvas, MD;  Location: [facility] ENDOSCOPY;  Service: Endoscopy;  Laterality: N/A;    SOCIAL HISTORY:   Social History   Tobacco Use  . Smoking status: Never Smoker  . Smokeless tobacco: Never Used  Substance Use Topics  . Alcohol use: No    FAMILY HISTORY:   Family History  Problem Relation Age of Onset  . Hypertension Other   . Diabetes Neg Hx     DRUG ALLERGIES:   Allergies  Allergen Reactions  . Glimepiride   . Tape Itching and Rash    REVIEW OF SYSTEMS:   Review of Systems  Unable to perform ROS: Mental status change  Neurological: Positive for dizziness and weakness.   MEDICATIONS AT HOME:   Prior to Admission medications   Medication Sig Start  Date End Date Taking? Authorizing Provider  carvedilol (COREG) 25 MG tablet Take 25 mg by mouth 2 (two) times daily with a meal.   Yes [provider]  imipramine (TOFRANIL) 50 MG tablet Take 50 mg by mouth at bedtime.   Yes [provider]  megestrol (MEGACE) 40 MG/ML suspension Take 400 mg by mouth 2 (two) times daily.    Yes [provider]  amLODipine (NORVASC) 5 MG tablet Take 1 tablet (5 mg total) by mouth  daily. Patient not taking: Reported on 11/29/2017 02/18/15   Loletha Grayer, MD  amoxicillin (AMOXIL) 500 MG tablet Take 2 tablets (1,000 mg total) by mouth 2 (two) times daily for 14 days. 11/29/17 12/13/17  Carrie Mew, MD  carbamide peroxide [facility]) 6.5 % otic solution Place 5 drops into the right ear 2 (two) times daily. Patient not taking: Reported on 11/29/2017 02/18/15   Loletha Grayer, MD  clarithromycin (BIAXIN) 500 MG tablet Take 1 tablet (500 mg total) by mouth 2 (two) times daily for 14 days. 11/29/17 12/13/17  Carrie Mew, MD  meclizine (ANTIVERT) 25 MG tablet Take 1 tablet (25 mg total) by mouth 3 (three) times daily. Patient not taking: Reported on 11/29/2017 02/18/15   Loletha Grayer, MD  pantoprazole (PROTONIX) 40 MG tablet Take 1 tablet (40 mg total) by mouth 2 (two) times daily for 14 days. 11/29/17 12/13/17  Carrie Mew, MD      VITAL SIGNS:  Blood pressure 134/80, pulse 67, temperature (!) 93.1 F (33.9 C), resp. rate (!) 21, height 5\' 7"  (1.702 m), weight 58.1 kg (128 lb), SpO2 98 %.  PHYSICAL EXAMINATION:  Physical Exam  Constitutional: He appears well-developed. He appears lethargic. He is uncooperative. No distress. He is not intubated.  HENT:  Head: Atraumatic.  Mouth/Throat: Oropharynx is clear and moist. No oropharyngeal exudate.  Eyes: Conjunctivae, EOM and lids are normal. No scleral icterus.  Neck: Neck supple. No JVD present. No thyromegaly present.  Cardiovascular: Normal rate, regular rhythm, S1 normal, S2 normal and normal heart sounds.  No extrasystoles are present. Exam reveals no gallop, no S3, no S4, no distant heart sounds and no friction rub.  No murmur heard. Pulmonary/Chest: No accessory muscle usage or stridor. Tachypnea noted. No apnea and no bradypnea. He is not intubated. No respiratory distress. He has decreased breath sounds in the right middle field, the right lower field, the left middle field and the left lower field. He has no  wheezes. He has no rhonchi. He has no rales.  Abdominal: Soft. He exhibits no distension. There is no tenderness. There is no rebound and no guarding.  Musculoskeletal: Normal range of motion. He exhibits no edema or tenderness.  Lymphadenopathy:    He has no cervical adenopathy.  Neurological: He has normal strength. He appears lethargic. He is disoriented.  Skin: Skin is warm, dry and intact. He is not diaphoretic. No erythema.  Psychiatric: He is agitated. He is noncommunicative. He is inattentive.   Per HPI: Pt is not verbally responsive at the time of my assessment. His eyes are closed. He opens his eyes transiently when you call his first name loudly, but closes them again. He is restless/agitated, but his movements are purposeful (interfering with treatment/devices). He has pulled out multiple IVs, and has attempted to grab his Foley catheter. He is not decorticating/decerebrating. He appears to be protecting his airway. He is taking rapid shallow breaths. He does not appear have any obvious gross focal neurological deficits with specific regards  to motor strength and cranial nerve function. He has fairly impressive muscle strength. He does not follow instructions. He is non-tachycardic but hypothermic. His lung sounds are diminished due to poor respiratory effort. He does not appear to be in distress. LABORATORY PANEL:   CBC Recent Labs  Lab 11/29/17 1649  WBC 11.5*  HGB 15.7  HCT 46.1  PLT 131*   ------------------------------------------------------------------------------------------------------------------  Chemistries  Recent Labs  Lab 11/29/17 1649  NA 142  K 4.2  CL 96*  CO2 37*  GLUCOSE 339*  BUN 24*  CREATININE 0.91  CALCIUM 9.6  AST 22  ALT 30  ALKPHOS 71  BILITOT 0.7   ------------------------------------------------------------------------------------------------------------------  Cardiac Enzymes Recent Labs  Lab 11/29/17 1649  TROPONINI <0.03    ------------------------------------------------------------------------------------------------------------------  RADIOLOGY:  Ct Head Wo Contrast  Result Date: 11/29/2017 CLINICAL DATA:  Unexplained weight loss for several months. Decreased appetite and increasing weakness with unclear speech. Dizziness today. EXAM: CT HEAD WITHOUT CONTRAST TECHNIQUE: Contiguous axial images were obtained from the base of the skull through the vertex without intravenous contrast. COMPARISON:  Head CT and MRI brain 02/16/2015. FINDINGS: Brain: There is no evidence of acute intracranial hemorrhage, mass lesion, brain edema or extra-axial fluid collection. The ventricles and subarachnoid spaces are appropriately sized for age. There is no CT evidence of acute cortical infarction. Stable minimal periventricular white matter disease. Small lacune or perivascular space in the left lentiform nucleus. Vascular: Mild intracranial vascular calcifications. No hyperdense vessel identified. Skull: Negative for fracture or focal lesion. Sinuses/Orbits: The visualized paranasal sinuses and mastoid air cells are clear. No orbital abnormalities are seen. Other: None. IMPRESSION: No acute intracranial findings. No explanation for the patient's symptoms. Electronically Signed   By: Richardean Sale M.D.   On: 11/29/2017 18:27   Dg Chest Port 1 View  Result Date: 11/30/2017 CLINICAL DATA:  Weakness. EXAM: PORTABLE CHEST 1 VIEW COMPARISON:  None. FINDINGS: Lung volumes are low. Patchy left basilar opacity with left pleural effusion. Normal heart size and mediastinal contours allowing for technique and rotation. Mild right infrahilar atelectasis. No pulmonary edema. No pneumothorax. IMPRESSION: Patchy left basilar opacity and left pleural effusion. Considerations include effusion causing adjacent atelectasis or pneumonia with parapneumonic effusion. Recommend radiographic follow-up. Electronically Signed   By: Jeb Levering M.D.   On:  11/30/2017 00:18   IMPRESSION AND PLAN:   A/P: 62M AMS, hypothermia + tachypnea + hypoxia, SIRS (+). U/A (+). CXR (+). Sepsis 2/2 UTI + acute bacterial community acquired pneumonia. Mild hypoxemic respiratory failure. Hyperglycemia, azotemia. -Admit to Stepdown -Continuous cardiac monitoring -Continuous pulse ox -Pt restless/agitated, non-violent but interfering w/ medical devices; soft limb restrains x4 as appropriate -TSH, FT4 WNL -Ammonia, B12, Folate, RPR pending -CT head (-) acute intracranial abnl -Hypothermic, T 93.30F (33.9C), tachypneic, mildly hypoxic (89%) on room air; WBC 11.2 (< 12.0), non-tachycardic; SIRS (+) -Bair hugger, fluid warmer -Mild hypoxemic respiratory failure, on [place] O2 -U/A cloudy, (+) glucose, Hgb, protein, bacteria, mucus, RBC, WBC -CXR (+) "Patchy left basilar opacity and left pleural effusion. Considerations include effusion causing adjacent atelectasis or pneumonia with parapneumonic effusion." -Will obtain CT chest when possible -Lactate 0.8 -(+) Sepsis 2/2 UTI + acute bacterial community acquired pneumonia, (-) hypotension -Vancomycin + Zosyn -BCx, UCx, sputum Cx/gram, UStrep + ULegionella Ag, PCT pending -DuoNebs for mobilization of secretions -Pt taking short shallow breaths; incentive spirometry -Pulmonary toileting -HbA1c, SSI -IVF, monitor BMP -11/11/2017 Echo (+) EF 45-50%, mild LVH, mild MR + PR, trivial TR -c/w home meds -Cardiac  diet as tolerated -Lovenox -Full code -Admission, > 2 midnights   All the records are reviewed and case discussed with ED provider. Management plans discussed with the patient, family and they are in agreement.  CODE STATUS: Full code  TOTAL TIME TAKING CARE OF THIS PATIENT: 90 minutes.    Arta Silence M.D on 11/30/2017 at 1:09 AM  Between 7am to 6pm - Pager - [phone]  After 6pm go to [email or web address] - Proofreader  [facility] [place] [facility]  Office  [phone]  CC:  Primary care physician; Tracie Harrier, MD   Note: This dictation was prepared with Dragon dictation along with smaller phrase technology. Any transcriptional errors that result from this process are unintentional.

## 2017-11-30 NOTE — ED Provider Notes (Signed)
Titrating patient's sedation.  After intubation he exhibited a somewhat chewing behavior on the tube and also listing what appears to be likely rigors.  Does not appear focal seizure activity, but I suspect he may be shaking from chills and low core temp. increasing sedation with pushes of Versed and Versed infusion now being initiated.  Weaning off and is down to very low-dose 0.5 mcg/min levo fed at this time to maintain blood pressure support.  Have called and updated Dr. Jefferson Fuel of the ICU regarding patient's intubation and small dose peripheral pressor.    Delman Kitten, MD 11/30/17 1048

## 2017-11-30 NOTE — ED Provider Notes (Signed)
Patient being managed by the hospitalist service.  Nursing noted this morning that his responsiveness is been slowly worsening.  At the present time he winces slightly to attempted eye opening, he is breathing but with somewhat shallow respirations.  There is no drooling or obvious evidence of aspiration, but does appear his mental status is waning.  Currently saturating about 91 to 92% on 2 L nasal cannula.  Have paged for CCM physician for them to evaluate bedside as being admitted to ICU and also called to updated Dr. Benjie Karvonen for reevaluation.  I have had respiratory therapy come, we have medications ready and equipment available in the event the patient does require end of needing intubation.  I have also called and spoke with the patient's wife this morning and updated her on the patient's worsening mental status and possible need for intubation.   Delman Kitten, MD 11/30/17 [phone]

## 2017-11-30 NOTE — ED Notes (Signed)
Rounded on patient, sitter at bedside for safety. Pt has soft mitts in place at this time.  Pt resting quietly with eyes closed, respirations equal and unlabored at this time.  VSS.  Waiting for bed to be assigned on unit.

## 2017-11-30 NOTE — ED Notes (Signed)
Soft mitten removed at this time, pt has been calm and not pulling at lines.

## 2017-11-30 NOTE — Progress Notes (Signed)
Pharmacy Antibiotic Note  Marc [name] is a 77 y.o. male admitted on 11/29/2017 with meningitis (presumed).  Pharmacy has been consulted for vancomycin dosing.  Plan: 1. Increase ampicillin to 2 gm IV Q4H for meningitis dosing 2. Patient received a dose of vancomycin last night - no load, start stack now. Vancomycin 1 gm IV Q18H, predicted trough 17 mcg/ml. Pharmacy will continue to follow and adjust as needed to maintain trough 15 to 20 mcg/ml.   Vd 39.7 L, Ke 0.052 hr-1, T1/2 13.4 hr  This patient is also prescribed CTX 2 gm IV Q12H and acyclovir 10 mg/kg IV Q8H for meningitis empiric therapy.  The amoxicillin and clarithromycin for H. Pylori have been held. PPI switched to IV.   Height: 5\' 9"  (175.3 cm) Weight: 125 lb (56.7 kg) IBW/kg (Calculated) : 70.7  Temp (24hrs), Avg:95.5 F (35.3 C), Min:92.5 F (33.6 C), Max:99.5 F (37.5 C)  Recent Labs  Lab 11/29/17 1649 11/29/17 2332 11/30/17 0323  WBC 11.5*  --   --   CREATININE 0.91  --  0.90  LATICACIDVEN  --  0.8 1.0    Estimated Creatinine Clearance: 56.9 mL/min (by C-G formula based on SCr of 0.9 mg/dL).    Allergies  Allergen Reactions  . Glimepiride   . Tape Itching and Rash    Antimicrobials this admission:   Dose adjustments this admission:   Microbiology results:  BCx:   UCx:    Sputum:    MRSA PCR:   Thank you for allowing pharmacy to be a part of this patient's care.  Laural Benes, Pharm.D., BCPS Clinical Pharmacist 11/30/2017 1:04 PM

## 2017-11-30 NOTE — Progress Notes (Signed)
Spoke with Tiffany RN re PICC.  RN to call [facility].

## 2017-11-30 NOTE — Progress Notes (Signed)
[facility] for updated status on PICC Line Placement. States that PICC RN will here between 1800 and 1830.

## 2017-11-30 NOTE — Progress Notes (Signed)
[facility] [place] NAME: Tyrone Pautsch    MR#:  [id number]  DATE OF BIRTH:  10/01/1940  SUBJECTIVE:   Patient was intubated this morning by ER physician due to inability to protect airway and unresponsiveness.  REVIEW OF SYSTEMS:    Intubated and sedated  Tolerating Diet: npo      DRUG ALLERGIES:   Allergies  Allergen Reactions  . Glimepiride   . Tape Itching and Rash    VITALS:  Blood pressure 103/73, pulse (!) 115, temperature 99.5 F (37.5 C), temperature source Axillary, resp. rate 17, height 5\' 9"  (1.753 m), weight 125 lb (56.7 kg), SpO2 98 %.  PHYSICAL EXAMINATION:  Constitutional: Appears critically ill-appearing and intubated HENT: Normocephalic. . Intubated Eyes: Conjunctivaeare normal.  Sluggish pupils   neck:. No JVD. No tracheal deviation. CVS: RRR, S1/S2 +, no murmurs, no gallops, no carotid bruit.  Pulmonary: Effort and breath sounds normal, no stridor, rhonchi, wheezes, rales.  Abdominal: Soft. BS +,  no distension, tenderness, rebound or guarding.  Musculoskeletal: N No edema and no tenderness.  Neuro: Sedated  skin: Skin is warm and dry. No rash noted. Psychiatric: Sedated    LABORATORY PANEL:   CBC Recent Labs  Lab 11/29/17 1649  WBC 11.5*  HGB 15.7  HCT 46.1  PLT 131*   ------------------------------------------------------------------------------------------------------------------  Chemistries  Recent Labs  Lab 11/29/17 1649 11/30/17 0323  NA 142 146*  K 4.2 4.9  CL 96* 101  CO2 37* 37*  GLUCOSE 339* 257*  BUN 24* 22  CREATININE 0.91 0.90  CALCIUM 9.6 9.2  MG  --  2.0  AST 22  --   ALT 30  --   ALKPHOS 71  --   BILITOT 0.7  --    ------------------------------------------------------------------------------------------------------------------  Cardiac Enzymes Recent Labs  Lab 11/29/17 1649  TROPONINI <0.03    ------------------------------------------------------------------------------------------------------------------  RADIOLOGY:  Ct Head Wo Contrast  Result Date: 11/29/2017 CLINICAL DATA:  Unexplained weight loss for several months. Decreased appetite and increasing weakness with unclear speech. Dizziness today. EXAM: CT HEAD WITHOUT CONTRAST TECHNIQUE: Contiguous axial images were obtained from the base of the skull through the vertex without intravenous contrast. COMPARISON:  Head CT and MRI brain 02/16/2015. FINDINGS: Brain: There is no evidence of acute intracranial hemorrhage, mass lesion, brain edema or extra-axial fluid collection. The ventricles and subarachnoid spaces are appropriately sized for age. There is no CT evidence of acute cortical infarction. Stable minimal periventricular white matter disease. Small lacune or perivascular space in the left lentiform nucleus. Vascular: Mild intracranial vascular calcifications. No hyperdense vessel identified. Skull: Negative for fracture or focal lesion. Sinuses/Orbits: The visualized paranasal sinuses and mastoid air cells are clear. No orbital abnormalities are seen. Other: None. IMPRESSION: No acute intracranial findings. No explanation for the patient's symptoms. Electronically Signed   By: Richardean Sale M.D.   On: 11/29/2017 18:27   Ct Chest W Contrast  Result Date: 11/30/2017 CLINICAL DATA:  Altered mental status. Left pleural effusion on x-Jarren EXAM: CT CHEST WITH CONTRAST TECHNIQUE: Multidetector CT imaging of the chest was performed during intravenous contrast administration. CONTRAST:  20mL OMNIPAQUE IOHEXOL 300 MG/ML  SOLN COMPARISON:  X-[place] earlier today FINDINGS: Cardiovascular: The heart is upper normal to mildly enlarged. No pericardial effusion. Coronary artery calcification is evident. Atherosclerotic calcification is noted in the wall of the thoracic aorta. Mediastinum/Nodes: No mediastinal lymphadenopathy. There is no hilar  lymphadenopathy. The esophagus has normal imaging features. There is no axillary  lymphadenopathy. Lungs/Pleura: There is minimal dependent mucus in the trachea. Right lower lobe collapse/consolidation evident. Collapse/consolidation also noted left lower lobe. No pleural effusion. Upper Abdomen: There may be a trace amount of fluid along the dome of the spleen. Trace sub pulmonic effusion would also be a consideration. Musculoskeletal: Expansile lytic foci are identified in the T11 vertebral body but appear to be isolated to this level. These are present on a study from 12/06/2010 and are unchanged. Tiny lucent foci are noted in the manubrium and sternum, indeterminate. IMPRESSION: 1. Bilateral lower lobe collapse/consolidation. 2. Possible tiny left pleural effusion versus a trace amount of fluid along the dome of the spleen. 3.  Aortic Atherosclerois (ICD10-170.0) Electronically Signed   By: Misty Stanley M.D.   On: 11/30/2017 02:48   Dg Chest Portable 1 View  Result Date: 11/30/2017 CLINICAL DATA:  Endotracheal tube placement and OG tube placement. Unexplained weight loss. Increased weakness. Became unresponsive this morning. EXAM: PORTABLE CHEST 1 VIEW COMPARISON:  Chest x-Braidyn dated 11/30/2017. FINDINGS: OG tube is adequately positioned in the stomach with tip directed towards the stomach fundus. Endotracheal tube is adequately positioned with tip approximately 5 cm above the carina. Dense consolidation at the LEFT lung base, as seen on today's earlier chest CT, atelectasis versus pneumonia. Mild atelectasis at the RIGHT lung base, as also seen on today's earlier chest CT. No new lung findings in the short-term interval. No pneumothorax seen. IMPRESSION: 1. Endotracheal tube is adequately positioned with tip approximately 5 cm above the carina. 2. OG tube is adequately positioned in the stomach with tip directed towards the stomach fundus. 3. Bibasilar consolidations, better demonstrated on today's earlier  chest CT, compatible with atelectasis, pneumonia or aspiration. Electronically Signed   By: Franki Cabot M.D.   On: 11/30/2017 09:53   Dg Chest Port 1 View  Result Date: 11/30/2017 CLINICAL DATA:  Weakness. EXAM: PORTABLE CHEST 1 VIEW COMPARISON:  None. FINDINGS: Lung volumes are low. Patchy left basilar opacity with left pleural effusion. Normal heart size and mediastinal contours allowing for technique and rotation. Mild right infrahilar atelectasis. No pulmonary edema. No pneumothorax. IMPRESSION: Patchy left basilar opacity and left pleural effusion. Considerations include effusion causing adjacent atelectasis or pneumonia with parapneumonic effusion. Recommend radiographic follow-up. Electronically Signed   By: Jeb Levering M.D.   On: 11/30/2017 00:18     ASSESSMENT AND PLAN:   77 year old male with history of essential hypertension who presented to the emergency room initially due to generalized weakness and weight loss.   1.  Acute hypoxic respiratory failure due to inability to protect airway/responsiveness and patient is now intubated by ED physician. Case discussed with Dr. Jefferson Fuel Continue vent management as per intensivist Consider MRI once more stable   2.  Hypothermia: Unclear etiology.  Patient may have underlying pneumonia by chest x-Cardale. Continue Zosyn and discontinue vancomycin TSH was within normal limits Continue to follow  3.  Acute encephalopathy of unclear etiology:  CT head without acute pathology Consider MRI brain   4.  Weight loss: Work-up after patient is more stable  5.  Diabetes with A1c of 7.4 Sliding scale  6.  H. pylori gastritis: Continue triple therapy once patient is able to take n.p.o. 7.  Hypotension after intubation: Monitor BP Initiate pressors if needed  D/w dr Jefferson Fuel  CODE STATUS: FULL  Critical careTOTAL TIME TAKING CARE OF THIS PATIENT: 30 minutes.   Consider palliative care consultation if no improvement after the next 48  hours.  POSSIBLE D/C ??, DEPENDING ON CLINICAL CONDITION.   Esco Joslyn M.D on 11/30/2017 at 12:10 PM  Between 7am to 6pm - Pager - [phone] After 6pm go to [email or web address] - password [email or web address] [place] [facility]  Office  [phone]  CC: Primary care physician; Tracie Harrier, MD  Note: This dictation was prepared with Dragon dictation along with smaller phrase technology. Any transcriptional errors that result from this process are unintentional.

## 2017-11-30 NOTE — ED Notes (Signed)
Attempted to initiate gag reflex in patient due to patient not responding, minimal reflex present, informed Dr. Jacqualine Code ED MD who came into assess patient, pt oxygen sats dropping to 89% on 2L [place], bumped up to 6L [place].  Dr. Jacqualine Code to speak with hospitalist and prepare for intubation.

## 2017-11-30 NOTE — ED Notes (Signed)
Pt intubated at this time with 7.5 tube measuring 25 at the teeth. ABG in 15 mins.

## 2017-11-30 NOTE — ED Notes (Signed)
Called pharmacy to bring up Versed drip, spoke with Nate states they are working on it now.

## 2017-11-30 NOTE — ED Notes (Signed)
Spoke with Dr. Benjie Karvonen, order received to hold insulin at this time.   Informed her the patient is not responding to verbal stimuli or painful stimuli.  Pt was resistant to this RN trying to open eyes.  Sitter at bedside as well. States patient has not been moving much, occasional twitch.

## 2017-11-30 NOTE — Progress Notes (Signed)
Pharmacy Antibiotic Note  Marc [name] is a 77 y.o. male admitted on 11/29/2017 with H Pylori.  Pharmacy has been consulted for antibiotic regimen dosing.  Plan: Use clarithromycin based triple therapy for 14 days: clarithromycin 500 mg po BID, amoxicillin 1000 mg po BID, pantoprazole 40 mg po BID AC. Confirmed with Dr. Benjie Karvonen patient is to remain on Zosyn for atelectasis and unresponsive now.   Height: 5\' 7"  (170.2 cm) Weight: 128 lb (58.1 kg) IBW/kg (Calculated) : 66.1  Temp (24hrs), Avg:94.1 F (34.5 C), Min:92.5 F (33.6 C), Max:98 F (36.7 C)  Recent Labs  Lab 11/29/17 1649 11/29/17 2332 11/30/17 0323  WBC 11.5*  --   --   CREATININE 0.91  --  0.90  LATICACIDVEN  --  0.8 1.0    Estimated Creatinine Clearance: 58.3 mL/min (by C-G formula based on SCr of 0.9 mg/dL).    Allergies  Allergen Reactions  . Glimepiride   . Tape Itching and Rash    Antimicrobials this admission:   Dose adjustments this admission:   Microbiology results:  BCx:   UCx:    Sputum:    MRSA PCR:   Thank you for allowing pharmacy to be a part of this patient's care.  Laural Benes, Pharm.D., BCPS Clinical Pharmacist 11/30/2017 8:48 AM

## 2017-11-30 NOTE — Progress Notes (Signed)
Pharmacy Antibiotic Note  Marc [name] is a 77 y.o. male admitted on 11/29/2017 with sepsis.  Pharmacy has been consulted for vanc/zosyn dosing. Patient received vanc 1g and zosyn 3.375g IV x 1 in ED  Plan: Will continue vanc 750 mg IV q12h w/ 8 hour stack  Will draw vanc trough 08/04 @ 1900 prior to 4th dose. Will continue zosyn 3.375g IV q8h  ke 0.0522 T1/2 12 hours Goal trough 15 - 20 mcg/mL  Height: 5\' 7"  (170.2 cm) Weight: 128 lb (58.1 kg) IBW/kg (Calculated) : 66.1  Temp (24hrs), Avg:95.6 F (35.3 C), Min:93.1 F (33.9 C), Max:98 F (36.7 C)  Recent Labs  Lab 11/29/17 1649 11/29/17 2332  WBC 11.5*  --   CREATININE 0.91  --   LATICACIDVEN  --  0.8    Estimated Creatinine Clearance: 57.6 mL/min (by C-G formula based on SCr of 0.91 mg/dL).    Allergies  Allergen Reactions  . Glimepiride   . Tape Itching and Rash    Thank you for allowing pharmacy to be a part of this patient's care.  Tobie Lords, PharmD, BCPS Clinical Pharmacist 11/30/2017

## 2017-11-30 NOTE — Consult Note (Signed)
Reason for Consult:respiratory failure, failure to thrive with altered mental status Referring Physician: hospitalist service  Marc [name] is an 77 y.o. male.  HPI: Mr. Puskas is a 77 year old gentleman with a past medical history of hypertension, hyperlipidemia, BPH, colonic adenomas,anxiety, diverticulosis,admitted with altered mental status, lethargy and generalized weakness. Family states that he has been undergoing workup for 30-40 pound weight loss over the past 4 months with decreasing appetite, poor by mouth intake and dehydration. He has had CT scan of the abdomen and pelvis, MRI of the spine, EGD which was remarkable for Barrett's esophagus and H. Pylori gastritis. CT scan of the head was performed which did not reveal any acute intracranial abnormality. His mental status continued to deteriorate, in emergency department he was noted to be restless, agitated, pulling at lines and Foley catheter and subsequently required intubation for airway protection.  Past Medical History:  Diagnosis Date  . Anxiety   . BPH (benign prostatic hyperplasia)   . Colon adenomas    history of  . Diverticulosis   . Hyperlipidemia   . Hypertension     Past Surgical History:  Procedure Laterality Date  . CHOLECYSTECTOMY    . COLONOSCOPY WITH PROPOFOL N/A 01/23/2016   Procedure: COLONOSCOPY WITH PROPOFOL;  Surgeon: Manya Silvas, MD;  Location: [facility] ENDOSCOPY;  Service: Endoscopy;  Laterality: N/A;  . ESOPHAGOGASTRODUODENOSCOPY (EGD) WITH PROPOFOL N/A 11/27/2017   Procedure: ESOPHAGOGASTRODUODENOSCOPY (EGD) WITH PROPOFOL;  Surgeon: Manya Silvas, MD;  Location: [facility] ENDOSCOPY;  Service: Endoscopy;  Laterality: N/A;    Family History  Problem Relation Age of Onset  . Hypertension Other   . Diabetes Neg Hx     Social History:  reports that he has never smoked. He has never used smokeless tobacco. He reports that he does not drink alcohol or use drugs.  Allergies:  Allergies  Allergen  Reactions  . Glimepiride   . Tape Itching and Rash    Medications: I have reviewed the patient's current medications.  Results for orders placed or performed during the hospital encounter of 11/29/17 (from the past 48 hour(s))  CBC     Status: Abnormal   Collection Time: 11/29/17  4:49 PM  Result Value Ref Range   WBC 11.5 (H) 3.8 - 10.6 K/uL   RBC 4.80 4.40 - 5.90 MIL/uL   Hemoglobin 15.7 13.0 - 18.0 g/dL   HCT 46.1 40.0 - 52.0 %   MCV 95.9 80.0 - 100.0 fL   MCH 32.7 26.0 - 34.0 pg   MCHC 34.1 32.0 - 36.0 g/dL   RDW 13.1 11.5 - 14.5 %   Platelets 131 (L) 150 - 440 K/uL    Comment: Performed at [facility], [address] [place], [place] [address]  Comprehensive metabolic panel     Status: Abnormal   Collection Time: 11/29/17  4:49 PM  Result Value Ref Range   Sodium 142 135 - 145 mmol/L   Potassium 4.2 3.5 - 5.1 mmol/L   Chloride 96 (L) 98 - 111 mmol/L   CO2 37 (H) 22 - 32 mmol/L   Glucose, Bld 339 (H) 70 - 99 mg/dL   BUN 24 (H) 8 - 23 mg/dL   Creatinine, Ser 0.91 0.61 - 1.24 mg/dL   Calcium 9.6 8.9 - 10.3 mg/dL   Total Protein 7.0 6.5 - 8.1 g/dL   Albumin 4.5 3.5 - 5.0 g/dL   AST 22 15 - 41 U/L   ALT 30 0 - 44 U/L   Alkaline  Phosphatase 71 38 - 126 U/L   Total Bilirubin 0.7 0.3 - 1.2 mg/dL   GFR calc non Af Amer >60 >60 mL/min   GFR calc Af Amer >60 >60 mL/min    Comment: (NOTE) The eGFR has been calculated using the CKD EPI equation. This calculation has not been validated in all clinical situations. eGFR's persistently <60 mL/min signify possible Chronic Kidney Disease.    Anion gap 9 5 - 15    Comment: Performed at [facility], [address] [place], [place] [address]  Troponin I     Status: None   Collection Time: 11/29/17  4:49 PM  Result Value Ref Range   Troponin I <0.03 <0.03 ng/mL    Comment: Performed at [facility], [address] [place], [place] [address]  Ethanol     Status: None   Collection Time: 11/29/17   4:50 PM  Result Value Ref Range   Alcohol, Ethyl (B) <10 <10 mg/dL    Comment: (NOTE) Lowest detectable limit for serum alcohol is 10 mg/dL. For medical purposes only. Performed at [facility], [address] [place], [place] [address]   TSH     Status: None   Collection Time: 11/29/17  4:50 PM  Result Value Ref Range   TSH 0.529 0.350 - 4.500 uIU/mL    Comment: Performed by a 3rd Generation assay with a functional sensitivity of <=0.01 uIU/mL. Performed at [facility], [address] [place], [place] [address]   T4, free     Status: None   Collection Time: 11/29/17  4:50 PM  Result Value Ref Range   Free T4 0.91 0.82 - 1.77 ng/dL    Comment: (NOTE) Biotin ingestion may interfere with free T4 tests. If the results are inconsistent with the TSH level, previous test results, or the clinical presentation, then consider biotin interference. If needed, order repeat testing after stopping biotin. Performed at [facility], [address] [place], [place] [address]   Urinalysis, Complete w Microscopic     Status: Abnormal   Collection Time: 11/29/17 11:32 PM  Result Value Ref Range   Color, Urine YELLOW (A) YELLOW   APPearance CLOUDY (A) CLEAR   Specific Gravity, Urine 1.023 1.005 - 1.030   pH 5.0 5.0 - 8.0   Glucose, UA >=500 (A) NEGATIVE mg/dL   Hgb urine dipstick MODERATE (A) NEGATIVE   Bilirubin Urine NEGATIVE NEGATIVE   Ketones, ur NEGATIVE NEGATIVE mg/dL   Protein, ur 30 (A) NEGATIVE mg/dL   Nitrite NEGATIVE NEGATIVE   Leukocytes, UA NEGATIVE NEGATIVE   RBC / HPF 21-50 0 - 5 RBC/hpf   WBC, UA 6-10 0 - 5 WBC/hpf   Bacteria, UA RARE (A) NONE SEEN   Squamous Epithelial / LPF 0-5 0 - 5   Mucus PRESENT    Hyaline Casts, UA PRESENT     Comment: Performed at [facility], [address] [place], [place] [address]  Urine Drug Screen, Qualitative     Status: Abnormal   Collection Time: 11/29/17 11:32 PM  Result Value Ref Range    Tricyclic, Ur Screen NONE DETECTED NONE DETECTED   Amphetamines, Ur Screen NONE DETECTED NONE DETECTED   MDMA (Ecstasy)Ur Screen NONE DETECTED NONE DETECTED   Cocaine Metabolite,Ur [place] NONE DETECTED NONE DETECTED   Opiate, Ur Screen NONE DETECTED NONE DETECTED   Phencyclidine (PCP) Ur S NONE DETECTED NONE DETECTED   Cannabinoid 50 Ng, Ur [place] NONE DETECTED NONE DETECTED  Barbiturates, Ur Screen NONE DETECTED NONE DETECTED   Benzodiazepine, Ur Scrn TEST NOT PERFORMED, REAGENT NOT AVAILABLE (A) NONE DETECTED   Methadone Scn, Ur NONE DETECTED NONE DETECTED    Comment: (NOTE) Tricyclics + metabolites, urine    Cutoff 1000 ng/mL Amphetamines + metabolites, urine  Cutoff 1000 ng/mL MDMA (Ecstasy), urine              Cutoff 500 ng/mL Cocaine Metabolite, urine          Cutoff 300 ng/mL Opiate + metabolites, urine        Cutoff 300 ng/mL Phencyclidine (PCP), urine         Cutoff 25 ng/mL Cannabinoid, urine                 Cutoff 50 ng/mL Barbiturates + metabolites, urine  Cutoff 200 ng/mL Benzodiazepine, urine              Cutoff 200 ng/mL Methadone, urine                   Cutoff 300 ng/mL The urine drug screen provides only a preliminary, unconfirmed analytical test result and should not be used for non-medical purposes. Clinical consideration and professional judgment should be applied to any positive drug screen result due to possible interfering substances. A more specific alternate chemical method must be used in order to obtain a confirmed analytical result. Gas chromatography / mass spectrometry (GC/MS) is the preferred confirmat ory method. Performed at [facility], [address] [place], [place] [address]   Lactic acid, plasma     Status: None   Collection Time: 11/29/17 11:32 PM  Result Value Ref Range   Lactic Acid, Venous 0.8 0.5 - 1.9 mmol/L    Comment: Performed at [facility], [address] [place], [place] [address]  Blood Culture (routine x 2)      Status: None (Preliminary result)   Collection Time: 11/29/17 11:32 PM  Result Value Ref Range   Specimen Description BLOOD LEFT ANTECUBITAL    Special Requests      BOTTLES DRAWN AEROBIC AND ANAEROBIC Blood Culture adequate volume   Culture      NO GROWTH < 12 HOURS Performed at [facility], [address], [place], [place] [address]    Report Status PENDING   Blood Culture (routine x 2)     Status: None (Preliminary result)   Collection Time: 11/29/17 11:32 PM  Result Value Ref Range   Specimen Description BLOOD RIGHT ANTECUBITAL    Special Requests      BOTTLES DRAWN AEROBIC AND ANAEROBIC Blood Culture adequate volume   Culture      NO GROWTH < 12 HOURS Performed at [facility], [address] [place], [place] [address]    Report Status PENDING   Lactic acid, plasma     Status: None   Collection Time: 11/30/17  3:23 AM  Result Value Ref Range   Lactic Acid, Venous 1.0 0.5 - 1.9 mmol/L    Comment: Performed at [facility], [address] [place], [place] [address]  Vitamin B12     Status: None   Collection Time: 11/30/17  3:23 AM  Result Value Ref Range   Vitamin B-12 405 180 - 914 pg/mL    Comment: (NOTE) This assay is not validated for testing neonatal or myeloproliferative syndrome specimens for Vitamin B12 levels. Performed at [facility], [place] [address] [place], [place] [address]   Folate  Status: None   Collection Time: 11/30/17  3:23 AM  Result Value Ref Range   Folate 18.7 >5.9 ng/mL    Comment: Performed at [facility], [address] [place], [place] [address]  Ammonia     Status: Abnormal   Collection Time: 11/30/17  3:23 AM  Result Value Ref Range   Ammonia 39 (H) 9 - 35 umol/L    Comment: Performed at [facility], [address] [place], [place] [address]  Hemoglobin A1c     Status: Abnormal   Collection Time: 11/30/17  3:23 AM  Result Value Ref Range   Hgb A1c MFr Bld 7.4 (H) 4.8 -  5.6 %    Comment: (NOTE) Pre diabetes:          5.7%-6.4% Diabetes:              >6.4% Glycemic control for   <7.0% adults with diabetes    Mean Plasma Glucose 165.68 mg/dL    Comment: Performed at [facility], [place] [address] [place], [place] [address]  Rapid HIV screen (HIV 1/2 Ab+Ag) ([facility] Only)     Status: None   Collection Time: 11/30/17  3:23 AM  Result Value Ref Range   HIV-1 P24 Antigen - HIV24 NON REACTIVE NON REACTIVE   HIV 1/2 Antibodies NON REACTIVE NON REACTIVE   Interpretation (HIV Ag Ab)      A non reactive test result means that HIV 1 or HIV 2 antibodies and HIV 1 p24 antigen were not detected in the specimen.    Comment: Performed at [facility], [address] [place], [place] [address]  Procalcitonin - Baseline     Status: None   Collection Time: 11/30/17  3:23 AM  Result Value Ref Range   Procalcitonin <0.10 ng/mL    Comment:        Interpretation: PCT (Procalcitonin) <= 0.5 ng/mL: Systemic infection (sepsis) is not likely. Local bacterial infection is possible. (NOTE)       Sepsis PCT Algorithm           Lower Respiratory Tract                                      Infection PCT Algorithm    ----------------------------     ----------------------------         PCT < 0.25 ng/mL                PCT < 0.10 ng/mL         Strongly encourage             Strongly discourage   discontinuation of antibiotics    initiation of antibiotics    ----------------------------     -----------------------------       PCT 0.25 - 0.50 ng/mL            PCT 0.10 - 0.25 ng/mL               OR       >80% decrease in PCT            Discourage initiation of                                            antibiotics  Encourage discontinuation           of antibiotics    ----------------------------     -----------------------------         PCT >= 0.50 ng/mL              PCT 0.26 - 0.50 ng/mL               AND        <80% decrease in PCT             Encourage  initiation of                                             antibiotics       Encourage continuation           of antibiotics    ----------------------------     -----------------------------        PCT >= 0.50 ng/mL                  PCT > 0.50 ng/mL               AND         increase in PCT                  Strongly encourage                                      initiation of antibiotics    Strongly encourage escalation           of antibiotics                                     -----------------------------                                           PCT <= 0.25 ng/mL                                                 OR                                        > 80% decrease in PCT                                     Discontinue / Do not initiate                                             antibiotics Performed at [facility], [address] [place], [place] [address]   Phosphorus     Status: Abnormal   Collection Time: 11/30/17  3:23 AM  Result Value Ref Range  Phosphorus 6.5 (H) 2.5 - 4.6 mg/dL    Comment: Performed at [facility], [address] [place], [place] [address]  Magnesium     Status: None   Collection Time: 11/30/17  3:23 AM  Result Value Ref Range   Magnesium 2.0 1.7 - 2.4 mg/dL    Comment: Performed at [facility], [address] [place], [place] [address]  Prealbumin     Status: None   Collection Time: 11/30/17  3:23 AM  Result Value Ref Range   Prealbumin 23.0 18 - 38 mg/dL    Comment: Performed at [facility] [address] [place], [place] [address]  Strep pneumoniae urinary antigen     Status: None   Collection Time: 11/30/17  3:23 AM  Result Value Ref Range   Strep Pneumo Urinary Antigen NEGATIVE NEGATIVE    Comment:        Infection due to S. pneumoniae cannot be absolutely ruled out since the antigen present may be below the detection limit of the test. Performed at [facility], [place] [address],  [place], [place] [address]   Basic metabolic panel     Status: Abnormal   Collection Time: 11/30/17  3:23 AM  Result Value Ref Range   Sodium 146 (H) 135 - 145 mmol/L   Potassium 4.9 3.5 - 5.1 mmol/L   Chloride 101 98 - 111 mmol/L   CO2 37 (H) 22 - 32 mmol/L   Glucose, Bld 257 (H) 70 - 99 mg/dL   BUN 22 8 - 23 mg/dL   Creatinine, Ser 0.90 0.61 - 1.24 mg/dL   Calcium 9.2 8.9 - 10.3 mg/dL   GFR calc non Af Amer >60 >60 mL/min   GFR calc Af Amer >60 >60 mL/min    Comment: (NOTE) The eGFR has been calculated using the CKD EPI equation. This calculation has not been validated in all clinical situations. eGFR's persistently <60 mL/min signify possible Chronic Kidney Disease.    Anion gap 8 5 - 15    Comment: Performed at [facility], [address] [place], [place] [address]  Glucose, capillary     Status: Abnormal   Collection Time: 11/30/17  8:17 AM  Result Value Ref Range   Glucose-Capillary 176 (H) 70 - 99 mg/dL  Blood gas, arterial     Status: Abnormal (Preliminary result)   Collection Time: 11/30/17  9:33 AM  Result Value Ref Range   FIO2 0.40    Delivery systems VENTILATOR    Mode ASSIST CONTROL    VT 500 mL   LHR 16 resp/min   Peep/cpap 5.0 cm H20   Inspiratory PAP PENDING    Expiratory PAP PENDING    pH, Arterial 7.40 7.350 - 7.450   pCO2 arterial 61 (H) 32.0 - 48.0 mmHg   pO2, Arterial 89 83.0 - 108.0 mmHg   Bicarbonate 37.8 (H) 20.0 - 28.0 mmol/L   Acid-Base Excess 10.5 (H) 0.0 - 2.0 mmol/L   O2 Saturation 96.8 %   Patient temperature 37.0    Collection site RIGHT BRACHIAL    Sample type ARTERIAL DRAW     Comment: Performed at [facility], [address] [place],  [address]  Glucose, capillary     Status: Abnormal   Collection Time: 11/30/17 11:59 AM  Result Value Ref Range   Glucose-Capillary 192 (H) 70 - 99 mg/dL    Ct Head Wo Contrast  Result Date: 11/29/2017 CLINICAL DATA:  Unexplained weight  loss for several months. Decreased  appetite and increasing weakness with unclear speech. Dizziness today. EXAM: CT HEAD WITHOUT CONTRAST TECHNIQUE: Contiguous axial images were obtained from the base of the skull through the vertex without intravenous contrast. COMPARISON:  Head CT and MRI brain 02/16/2015. FINDINGS: Brain: There is no evidence of acute intracranial hemorrhage, mass lesion, brain edema or extra-axial fluid collection. The ventricles and subarachnoid spaces are appropriately sized for age. There is no CT evidence of acute cortical infarction. Stable minimal periventricular white matter disease. Small lacune or perivascular space in the left lentiform nucleus. Vascular: Mild intracranial vascular calcifications. No hyperdense vessel identified. Skull: Negative for fracture or focal lesion. Sinuses/Orbits: The visualized paranasal sinuses and mastoid air cells are clear. No orbital abnormalities are seen. Other: None. IMPRESSION: No acute intracranial findings. No explanation for the patient's symptoms. Electronically Signed   By: Richardean Sale M.D.   On: 11/29/2017 18:27   Ct Chest W Contrast  Result Date: 11/30/2017 CLINICAL DATA:  Altered mental status. Left pleural effusion on x-[place] EXAM: CT CHEST WITH CONTRAST TECHNIQUE: Multidetector CT imaging of the chest was performed during intravenous contrast administration. CONTRAST:  61m OMNIPAQUE IOHEXOL 300 MG/ML  SOLN COMPARISON:  X-[place] earlier today FINDINGS: Cardiovascular: The heart is upper normal to mildly enlarged. No pericardial effusion. Coronary artery calcification is evident. Atherosclerotic calcification is noted in the wall of the thoracic aorta. Mediastinum/Nodes: No mediastinal lymphadenopathy. There is no hilar lymphadenopathy. The esophagus has normal imaging features. There is no axillary lymphadenopathy. Lungs/Pleura: There is minimal dependent mucus in the trachea. Right lower lobe collapse/consolidation evident. Collapse/consolidation also noted left lower  lobe. No pleural effusion. Upper Abdomen: There may be a trace amount of fluid along the dome of the spleen. Trace sub pulmonic effusion would also be a consideration. Musculoskeletal: Expansile lytic foci are identified in the T11 vertebral body but appear to be isolated to this level. These are present on a study from 12/06/2010 and are unchanged. Tiny lucent foci are noted in the manubrium and sternum, indeterminate. IMPRESSION: 1. Bilateral lower lobe collapse/consolidation. 2. Possible tiny left pleural effusion versus a trace amount of fluid along the dome of the spleen. 3.  Aortic Atherosclerois (ICD10-170.0) Electronically Signed   By: EMisty StanleyM.D.   On: 11/30/2017 02:48   Dg Chest Portable 1 View  Result Date: 11/30/2017 CLINICAL DATA:  Endotracheal tube placement and OG tube placement. Unexplained weight loss. Increased weakness. Became unresponsive this morning. EXAM: PORTABLE CHEST 1 VIEW COMPARISON:  Chest x-[place] dated 11/30/2017. FINDINGS: OG tube is adequately positioned in the stomach with tip directed towards the stomach fundus. Endotracheal tube is adequately positioned with tip approximately 5 cm above the carina. Dense consolidation at the LEFT lung base, as seen on today's earlier chest CT, atelectasis versus pneumonia. Mild atelectasis at the RIGHT lung base, as also seen on today's earlier chest CT. No new lung findings in the short-term interval. No pneumothorax seen. IMPRESSION: 1. Endotracheal tube is adequately positioned with tip approximately 5 cm above the carina. 2. OG tube is adequately positioned in the stomach with tip directed towards the stomach fundus. 3. Bibasilar consolidations, better demonstrated on today's earlier chest CT, compatible with atelectasis, pneumonia or aspiration. Electronically Signed   By: SFranki CabotM.D.   On: 11/30/2017 09:53   Dg Chest Port 1 View  Result Date: 11/30/2017 CLINICAL DATA:  Weakness. EXAM: PORTABLE CHEST 1 VIEW COMPARISON:  None.  FINDINGS: Lung volumes are low. Patchy left basilar opacity  with left pleural effusion. Normal heart size and mediastinal contours allowing for technique and rotation. Mild right infrahilar atelectasis. No pulmonary edema. No pneumothorax. IMPRESSION: Patchy left basilar opacity and left pleural effusion. Considerations include effusion causing adjacent atelectasis or pneumonia with parapneumonic effusion. Recommend radiographic follow-up. Electronically Signed   By: Jeb Levering M.D.   On: 11/30/2017 00:18    ROS Blood pressure 103/73, pulse (!) 115, temperature 99.5 F (37.5 C), temperature source Axillary, resp. rate 17, height 5' 9" (1.753 m), weight 125 lb (56.7 kg), SpO2 98 %. Physical Exam  Patient is intubated, sedated, elderly appearing Caucasian male Vital signs: Please see the above listed vital signs HEENT: No oral lesions appreciated, patient is orally intubated, trachea midline, no thyromegaly appreciated, no jugular venous distention Cardiovascular: Regular rate and rhythm Pulmonary: Coarse rhonchi appreciated Abdominal: Positive bowel sounds, soft exam Extremities: Nuclear cyanosis or edema noted Neurologic: Patient moves all extremities Cutaneous: No rashes or lesions noted  Assessment/Plan:  Altered mental status. Unclear etiology. Patient has had a negative CAT scan of the head, pending MRI of the brain. Has been intubated for airway protection. No clinical evidence of seizure activity,will empirically place on vancomycin, Rocephin and ampicillin along with acyclovir pending MRI result will consider neurology consultation in the morning  Respiratory failure. Patient intubated for airway protection. CT scan of the chest does reveal basilar consolidation consistent with possible pneumonia. We'll place on broad-spectrum antibiotic coverage, obtain respiratory culture data, urine for Legionella and pneumococcus  Acid-based derangement. Patient with compensated respiratory  acidosis  Mild leukocytosis. Blood urine and sputum culture    , 11/30/2017, 12:23 PM

## 2017-12-01 ENCOUNTER — Inpatient Hospital Stay: Payer: PPO

## 2017-12-01 DIAGNOSIS — R400 Somnolence: Secondary | ICD-10-CM

## 2017-12-01 LAB — GLUCOSE, CAPILLARY
GLUCOSE-CAPILLARY: 137 mg/dL — AB (ref 70–99)
GLUCOSE-CAPILLARY: 170 mg/dL — AB (ref 70–99)
GLUCOSE-CAPILLARY: 180 mg/dL — AB (ref 70–99)
GLUCOSE-CAPILLARY: 220 mg/dL — AB (ref 70–99)
Glucose-Capillary: 91 mg/dL (ref 70–99)
Glucose-Capillary: 156 mg/dL — ABNORMAL HIGH (ref 70–99)

## 2017-12-01 LAB — CBC WITH DIFFERENTIAL/PLATELET
BASOS PCT: 0 %
Basophils Absolute: 0 10*3/uL (ref 0–0.1)
EOS ABS: 0 10*3/uL (ref 0–0.7)
Eosinophils Relative: 0 %
HCT: 35 % — ABNORMAL LOW (ref 40.0–52.0)
HEMOGLOBIN: 12.3 g/dL — AB (ref 13.0–18.0)
LYMPHS ABS: 1.7 10*3/uL (ref 1.0–3.6)
Lymphocytes Relative: 13 %
MCH: 34 pg (ref 26.0–34.0)
MCHC: 35.1 g/dL (ref 32.0–36.0)
MCV: 96.9 fL (ref 80.0–100.0)
Monocytes Absolute: 1.6 10*3/uL — ABNORMAL HIGH (ref 0.2–1.0)
Monocytes Relative: 12 %
NEUTROS PCT: 75 %
Neutro Abs: 9.8 10*3/uL — ABNORMAL HIGH (ref 1.4–6.5)
Platelets: 118 10*3/uL — ABNORMAL LOW (ref 150–440)
RBC: 3.61 MIL/uL — AB (ref 4.40–5.90)
RDW: 13 % (ref 11.5–14.5)
WBC: 13.1 10*3/uL — ABNORMAL HIGH (ref 3.8–10.6)

## 2017-12-01 LAB — PROCALCITONIN: Procalcitonin: 0.11 ng/mL

## 2017-12-01 LAB — BASIC METABOLIC PANEL
Anion gap: 10 (ref 5–15)
Anion gap: 5 (ref 5–15)
BUN: 34 mg/dL — ABNORMAL HIGH (ref 8–23)
BUN: 35 mg/dL — AB (ref 8–23)
CALCIUM: 7.5 mg/dL — AB (ref 8.9–10.3)
CHLORIDE: 99 mmol/L (ref 98–111)
CHLORIDE: 107 mmol/L (ref 98–111)
CO2: 28 mmol/L (ref 22–32)
CO2: 32 mmol/L (ref 22–32)
Calcium: 8.6 mg/dL — ABNORMAL LOW (ref 8.9–10.3)
Creatinine, Ser: 1.4 mg/dL — ABNORMAL HIGH (ref 0.61–1.24)
Creatinine, Ser: 1.37 mg/dL — ABNORMAL HIGH (ref 0.61–1.24)
GFR calc non Af Amer: 48 mL/min — ABNORMAL LOW (ref >60)
GFR, EST AFRICAN AMERICAN: 55 mL/min — AB (ref >60)
GFR, EST AFRICAN AMERICAN: 57 mL/min — AB (ref >60)
GFR, EST NON AFRICAN AMERICAN: 49 mL/min — AB (ref >60)
Glucose, Bld: 166 mg/dL — ABNORMAL HIGH (ref 70–99)
Glucose, Bld: 197 mg/dL — ABNORMAL HIGH (ref 70–99)
POTASSIUM: 2.6 mmol/L — AB (ref 3.5–5.1)
POTASSIUM: 3.5 mmol/L (ref 3.5–5.1)
SODIUM: 137 mmol/L (ref 135–145)
SODIUM: 144 mmol/L (ref 135–145)

## 2017-12-01 LAB — PHOSPHORUS
PHOSPHORUS: 2.8 mg/dL (ref 2.5–4.6)
Phosphorus: 2 mg/dL — ABNORMAL LOW (ref 2.5–4.6)

## 2017-12-01 LAB — MAGNESIUM
MAGNESIUM: 1.6 mg/dL — AB (ref 1.7–2.4)
Magnesium: 2.2 mg/dL (ref 1.7–2.4)

## 2017-12-01 LAB — BLOOD GAS, ARTERIAL
ACID-BASE EXCESS: 10.5 mmol/L — AB (ref 0.0–2.0)
Bicarbonate: 37.8 mmol/L — ABNORMAL HIGH (ref 20.0–28.0)
FIO2: 0.4
MECHVT: 500 mL
O2 Saturation: 96.8 %
PCO2 ART: 61 mmHg — AB (ref 32.0–48.0)
PEEP/CPAP: 5 cmH2O
PO2 ART: 89 mmHg (ref 83.0–108.0)
Patient temperature: 37
RATE: 16 resp/min
pH, Arterial: 7.4 (ref 7.350–7.450)

## 2017-12-01 LAB — URINE CULTURE

## 2017-12-01 LAB — RPR: RPR: NONREACTIVE

## 2017-12-01 LAB — CALCIUM, IONIZED: CALCIUM, IONIZED, SERUM: 4.8 mg/dL (ref 4.5–5.6)

## 2017-12-01 MED ORDER — POTASSIUM CHLORIDE 10 MEQ/50ML IV SOLN
10.0000 meq | INTRAVENOUS | Status: AC
  Administered 2017-12-01 (×6): 10 meq via INTRAVENOUS
  Filled 2017-12-01 (×8): qty 50

## 2017-12-01 MED ORDER — SODIUM CHLORIDE 0.9% FLUSH
10.0000 mL | Freq: Two times a day (BID) | INTRAVENOUS | Status: DC
  Administered 2017-12-02 – 2017-12-09 (×11): 10 mL
  Administered 2017-12-09: 40 mL
  Administered 2017-12-10 – 2017-12-11 (×4): 10 mL
  Administered 2017-12-12: 20 mL
  Administered 2017-12-12 – 2017-12-14 (×2): 10 mL
  Administered 2017-12-14: 40 mL
  Administered 2017-12-15: 30 mL
  Administered 2017-12-15 – 2017-12-17 (×4): 10 mL
  Administered 2017-12-17 – 2017-12-18 (×2): 30 mL
  Administered 2017-12-19 – 2017-12-20 (×3): 10 mL

## 2017-12-01 MED ORDER — MAGNESIUM SULFATE 2 GM/50ML IV SOLN
2.0000 g | Freq: Once | INTRAVENOUS | Status: AC
  Administered 2017-12-01: 2 g via INTRAVENOUS
  Filled 2017-12-01: qty 50

## 2017-12-01 MED ORDER — AMPICILLIN SODIUM 2 G IJ SOLR
2.0000 g | Freq: Four times a day (QID) | INTRAMUSCULAR | Status: DC
Start: 2017-12-01 — End: 2017-12-03
  Administered 2017-12-01 – 2017-12-03 (×8): 2 g via INTRAVENOUS
  Filled 2017-12-01 (×2): qty 2000
  Filled 2017-12-01 (×3): qty 2
  Filled 2017-12-01: qty 2000
  Filled 2017-12-01: qty 2
  Filled 2017-12-01 (×2): qty 2000
  Filled 2017-12-01 (×2): qty 2

## 2017-12-01 MED ORDER — VITAL HIGH PROTEIN PO LIQD
1000.0000 mL | ORAL | Status: DC
  Administered 2017-12-01: 1000 mL

## 2017-12-01 MED ORDER — SODIUM CHLORIDE 0.9% FLUSH: 10.0000 mL | INTRAVENOUS | Status: DC | PRN

## 2017-12-01 MED ORDER — POTASSIUM PHOSPHATES 15 MMOLE/5ML IV SOLN
15.0000 mmol | Freq: Once | INTRAVENOUS | Status: AC
  Administered 2017-12-01: 15 mmol via INTRAVENOUS
  Filled 2017-12-01: qty 5

## 2017-12-01 MED ORDER — DEXTROSE 5 % IV SOLN
10.0000 mg/kg | Freq: Two times a day (BID) | INTRAVENOUS | Status: DC
  Administered 2017-12-01 – 2017-12-02 (×3): 565 mg via INTRAVENOUS
  Filled 2017-12-01 (×5): qty 11.3

## 2017-12-01 MED ORDER — VANCOMYCIN HCL IN DEXTROSE 1-5 GM/200ML-% IV SOLN
1000.0000 mg | INTRAVENOUS | Status: DC
  Administered 2017-12-02 – 2017-12-03 (×2): 1000 mg via INTRAVENOUS
  Filled 2017-12-01 (×2): qty 200

## 2017-12-01 MED ORDER — ADULT MULTIVITAMIN LIQUID CH
15.0000 mL | Freq: Every day | ORAL | Status: DC
  Administered 2017-12-01 – 2017-12-04 (×4): 15 mL
  Filled 2017-12-01 (×5): qty 15

## 2017-12-01 NOTE — Consult Note (Signed)
Reason for Consult: confusion  Referring Physician: Dr. Jefferson Fuel   CC: Confusion   HPI: Marc [name] is an 77 y.o. male with a past medical history of hypertension, hyperlipidemia, BPH, colonic adenomas,anxiety, diverticulosis,admitted with altered mental status, lethargy and generalized weakness. Family states that he has been undergoing workup for 30-40 pound weight loss over the past 4 months with decreasing appetite, poor by mouth intake and dehydration.  Unclear the etiology of symptoms. CTH no acute abnormalities.      Past Medical History:  Diagnosis Date  . Anxiety   . BPH (benign prostatic hyperplasia)   . Colon adenomas    history of  . Diverticulosis   . Hyperlipidemia   . Hypertension     Past Surgical History:  Procedure Laterality Date  . CHOLECYSTECTOMY    . COLONOSCOPY WITH PROPOFOL N/A 01/23/2016   Procedure: COLONOSCOPY WITH PROPOFOL;  Surgeon: Manya Silvas, MD;  Location: [facility] ENDOSCOPY;  Service: Endoscopy;  Laterality: N/A;  . ESOPHAGOGASTRODUODENOSCOPY (EGD) WITH PROPOFOL N/A 11/27/2017   Procedure: ESOPHAGOGASTRODUODENOSCOPY (EGD) WITH PROPOFOL;  Surgeon: Manya Silvas, MD;  Location: [facility] ENDOSCOPY;  Service: Endoscopy;  Laterality: N/A;    Family History  Problem Relation Age of Onset  . Hypertension Other   . Diabetes Neg Hx     Social History:  reports that he has never smoked. He has never used smokeless tobacco. He reports that he does not drink alcohol or use drugs.  Allergies  Allergen Reactions  . Glimepiride   . Tape Itching and Rash    Medications: I have reviewed the patient's current medications.  ROS: Unable to obtain   Physical Examination: Blood pressure (!) 141/92, pulse 90, temperature 98.2 F (36.8 C), temperature source Core (Comment), resp. rate 15, height 5\' 9"  (1.753 m), weight 132 lb 4.4 oz (60 kg), SpO2 99 %.  Slow to respond. Lethargic Moves symmetrically but does not follow commands EOM intact Pupils  responsive.    Laboratory Studies:   Basic Metabolic Panel: Recent Labs  Lab 11/29/17 1649 11/30/17 0323 12/01/17 0520  NA 142 146* 137  K 4.2 4.9 2.6*  CL 96* 101 99  CO2 37* 37* 28  GLUCOSE 339* 257* 166*  BUN 24* 22 34*  CREATININE 0.91 0.90 1.40*  CALCIUM 9.6 9.2 7.5*  MG  --  2.0 1.6*  PHOS  --  6.5* 2.0*    Liver Function Tests: Recent Labs  Lab 11/29/17 1649  AST 22  ALT 30  ALKPHOS 71  BILITOT 0.7  PROT 7.0  ALBUMIN 4.5   No results for input(s): LIPASE, AMYLASE in the last 168 hours. Recent Labs  Lab 11/30/17 0323  AMMONIA 39*    CBC: Recent Labs  Lab 11/29/17 1649 12/01/17 0520  WBC 11.5* 13.1*  NEUTROABS  --  9.8*  HGB 15.7 12.3*  HCT 46.1 35.0*  MCV 95.9 96.9  PLT 131* 118*    Cardiac Enzymes: Recent Labs  Lab 11/29/17 1649  TROPONINI <0.03    BNP: Invalid input(s): POCBNP  CBG: Recent Labs  Lab 11/30/17 2015 11/30/17 2356 12/01/17 0440 12/01/17 0736 12/01/17 1158  GLUCAP 149* 135* 91 137* 156*    Microbiology: Results for orders placed or performed during the hospital encounter of 11/29/17  Urine culture     Status: Abnormal   Collection Time: 11/29/17 11:32 PM  Result Value Ref Range Status   Specimen Description   Final    URINE, RANDOM Performed at [facility], [place]  [address], [place], [place] [address]    Special Requests   Final    NONE Performed at [facility], [address] [place], [place] [address]    Culture (A)  Final    <10,000 COLONIES/mL INSIGNIFICANT GROWTH Performed at [facility] [address], [place], [place] [address]    Report Status 12/01/2017 FINAL  Final  Blood Culture (routine x 2)     Status: None (Preliminary result)   Collection Time: 11/29/17 11:32 PM  Result Value Ref Range Status   Specimen Description BLOOD LEFT ANTECUBITAL  Final   Special Requests   Final    BOTTLES DRAWN AEROBIC AND ANAEROBIC Blood Culture adequate volume   Culture   Final     NO GROWTH 2 DAYS Performed at [facility], [address] [place], [place] [address]    Report Status PENDING  Incomplete  Blood Culture (routine x 2)     Status: None (Preliminary result)   Collection Time: 11/29/17 11:32 PM  Result Value Ref Range Status   Specimen Description BLOOD RIGHT ANTECUBITAL  Final   Special Requests   Final    BOTTLES DRAWN AEROBIC AND ANAEROBIC Blood Culture adequate volume   Culture   Final    NO GROWTH 2 DAYS Performed at [facility], [address], [place], [place] [address]    Report Status PENDING  Incomplete  MRSA PCR Screening     Status: None   Collection Time: 11/30/17 11:59 AM  Result Value Ref Range Status   MRSA by PCR NEGATIVE NEGATIVE Final    Comment:        The GeneXpert MRSA Assay (FDA approved for NASAL specimens only), is one component of a comprehensive MRSA colonization surveillance program. It is not intended to diagnose MRSA infection nor to guide or monitor treatment for MRSA infections. Performed at [facility], [address] [place], [place] [address]     Coagulation Studies: No results for input(s): LABPROT, INR in the last 72 hours.  Urinalysis:  Recent Labs  Lab 11/29/17 2332  COLORURINE YELLOW*  LABSPEC 1.023  PHURINE 5.0  GLUCOSEU >=500*  HGBUR MODERATE*  BILIRUBINUR NEGATIVE  KETONESUR NEGATIVE  PROTEINUR 30*  NITRITE NEGATIVE  LEUKOCYTESUR NEGATIVE    Lipid Panel:     Component Value Date/Time   TRIG 141 11/30/2017 1902    HgbA1C:  Lab Results  Component Value Date   HGBA1C 7.4 (H) 11/30/2017    Urine Drug Screen:      Component Value Date/Time   LABOPIA NONE DETECTED 11/29/2017 2332   COCAINSCRNUR NONE DETECTED 11/29/2017 2332   LABBENZ TEST NOT PERFORMED, REAGENT NOT AVAILABLE (A) 11/29/2017 2332   AMPHETMU NONE DETECTED 11/29/2017 2332   THCU NONE DETECTED 11/29/2017 2332   LABBARB NONE DETECTED 11/29/2017 2332    Alcohol Level:  Recent  Labs  Lab 11/29/17 1650  ETH <10    Imaging: Dg Chest 1 View  Result Date: 11/30/2017 CLINICAL DATA:  o2 decrease EXAM: CHEST  1 VIEW COMPARISON:  11/30/2017 FINDINGS: Endotracheal tube is in place with tip approximately 3.9 centimeters above the carina. Nasogastric tube is in place, tip coiled back upon itself into the gastric fundal region. There is volume loss at the LEFT lung base, obscuring the LEFT hemidiaphragm. RIGHT lung is clear. No pulmonary edema. IMPRESSION: Persistent significant atelectasis and/or consolidation of the LEFT LOWER lobe. Endotracheal tube and nasogastric tube in good position. No edema. Electronically Signed  By: Nolon Nations M.D.   On: 11/30/2017 18:50   Ct Head Wo Contrast  Result Date: 11/29/2017 CLINICAL DATA:  Unexplained weight loss for several months. Decreased appetite and increasing weakness with unclear speech. Dizziness today. EXAM: CT HEAD WITHOUT CONTRAST TECHNIQUE: Contiguous axial images were obtained from the base of the skull through the vertex without intravenous contrast. COMPARISON:  Head CT and MRI brain 02/16/2015. FINDINGS: Brain: There is no evidence of acute intracranial hemorrhage, mass lesion, brain edema or extra-axial fluid collection. The ventricles and subarachnoid spaces are appropriately sized for age. There is no CT evidence of acute cortical infarction. Stable minimal periventricular white matter disease. Small lacune or perivascular space in the left lentiform nucleus. Vascular: Mild intracranial vascular calcifications. No hyperdense vessel identified. Skull: Negative for fracture or focal lesion. Sinuses/Orbits: The visualized paranasal sinuses and mastoid air cells are clear. No orbital abnormalities are seen. Other: None. IMPRESSION: No acute intracranial findings. No explanation for the patient's symptoms. Electronically Signed   By: Richardean Sale M.D.   On: 11/29/2017 18:27   Ct Chest W Contrast  Result Date:  11/30/2017 CLINICAL DATA:  Altered mental status. Left pleural effusion on x-Farrel EXAM: CT CHEST WITH CONTRAST TECHNIQUE: Multidetector CT imaging of the chest was performed during intravenous contrast administration. CONTRAST:  8mL OMNIPAQUE IOHEXOL 300 MG/ML  SOLN COMPARISON:  X-[place] earlier today FINDINGS: Cardiovascular: The heart is upper normal to mildly enlarged. No pericardial effusion. Coronary artery calcification is evident. Atherosclerotic calcification is noted in the wall of the thoracic aorta. Mediastinum/Nodes: No mediastinal lymphadenopathy. There is no hilar lymphadenopathy. The esophagus has normal imaging features. There is no axillary lymphadenopathy. Lungs/Pleura: There is minimal dependent mucus in the trachea. Right lower lobe collapse/consolidation evident. Collapse/consolidation also noted left lower lobe. No pleural effusion. Upper Abdomen: There may be a trace amount of fluid along the dome of the spleen. Trace sub pulmonic effusion would also be a consideration. Musculoskeletal: Expansile lytic foci are identified in the T11 vertebral body but appear to be isolated to this level. These are present on a study from 12/06/2010 and are unchanged. Tiny lucent foci are noted in the manubrium and sternum, indeterminate. IMPRESSION: 1. Bilateral lower lobe collapse/consolidation. 2. Possible tiny left pleural effusion versus a trace amount of fluid along the dome of the spleen. 3.  Aortic Atherosclerois (ICD10-170.0) Electronically Signed   By: Misty Stanley M.D.   On: 11/30/2017 02:48   Dg Chest Port 1 View  Result Date: 12/01/2017 CLINICAL DATA:  Check endotracheal tube EXAM: PORTABLE CHEST 1 VIEW COMPARISON:  11/30/2017 FINDINGS: Right-sided PICC line, endotracheal tube and nasogastric catheter are again seen and stable. Persistent changes in the left retrocardiac region are noted although some mild improved aeration is seen. No bony abnormality is noted. IMPRESSION: Improving left basilar  infiltrate. Tubes and lines as described. Electronically Signed   By: Inez Catalina M.D.   On: 12/01/2017 11:47   Dg Chest Portable 1 View  Result Date: 11/30/2017 CLINICAL DATA:  Endotracheal tube placement and OG tube placement. Unexplained weight loss. Increased weakness. Became unresponsive this morning. EXAM: PORTABLE CHEST 1 VIEW COMPARISON:  Chest x-[place]ton dated 11/30/2017. FINDINGS: OG tube is adequately positioned in the stomach with tip directed towards the stomach fundus. Endotracheal tube is adequately positioned with tip approximately 5 cm above the carina. Dense consolidation at the LEFT lung base, as seen on today's earlier chest CT, atelectasis versus pneumonia. Mild atelectasis at the RIGHT lung base, as also seen on  today's earlier chest CT. No new lung findings in the short-term interval. No pneumothorax seen. IMPRESSION: 1. Endotracheal tube is adequately positioned with tip approximately 5 cm above the carina. 2. OG tube is adequately positioned in the stomach with tip directed towards the stomach fundus. 3. Bibasilar consolidations, better demonstrated on today's earlier chest CT, compatible with atelectasis, pneumonia or aspiration. Electronically Signed   By: Franki Cabot M.D.   On: 11/30/2017 09:53   Dg Chest Port 1 View  Result Date: 11/30/2017 CLINICAL DATA:  Weakness. EXAM: PORTABLE CHEST 1 VIEW COMPARISON:  None. FINDINGS: Lung volumes are low. Patchy left basilar opacity with left pleural effusion. Normal heart size and mediastinal contours allowing for technique and rotation. Mild right infrahilar atelectasis. No pulmonary edema. No pneumothorax. IMPRESSION: Patchy left basilar opacity and left pleural effusion. Considerations include effusion causing adjacent atelectasis or pneumonia with parapneumonic effusion. Recommend radiographic follow-up. Electronically Signed   By: Jeb Levering M.D.   On: 11/30/2017 00:18   [place] Ekg Site Rite  Result Date: 11/30/2017 If Site Rite image  not attached, placement could not be confirmed due to current cardiac rhythm.    Assessment/Plan:  77 y.o. male with a past medical history of hypertension, hyperlipidemia, BPH, colonic adenomas,anxiety, diverticulosis,admitted with altered mental status, lethargy and generalized weakness. Family states that he has been undergoing workup for 30-40 pound weight loss over the past 4 months with decreasing appetite, poor by mouth intake and dehydration.  Unclear the etiology of symptoms. CTH no acute abnormalities.    - On broad spectrum anabiotics - No clear explanation of current symptoms - MRI done waiting for it to load  - EEG in AM - Would likely benefit from LP - con't acyclovir.    12/01/2017, 1:58 PM

## 2017-12-01 NOTE — Consult Note (Signed)
[facility] for Electrolyte Monitoring and Replacement  Indication: Severe Malnutrition, Risk of Refeeding Syndrome   Patient Measurements: Height: 5\' 9"  (175.3 cm) Weight: 132 lb 4.4 oz (60 kg) IBW/kg (Calculated) : 70.7   Labs: Potassium (mmol/L)  Date Value  12/01/2017 2.6 (LL)   Magnesium (mg/dL)  Date Value  12/01/2017 1.6 (L)   Calcium (mg/dL)  Date Value  12/01/2017 7.5 (L)   Albumin (g/dL)  Date Value  11/29/2017 4.5   Phosphorus (mg/dL)  Date Value  12/01/2017 2.0 (L)  ] Estimated Creatinine Clearance: 38.7 mL/min (A) (by C-G formula based on SCr of 1.4 mg/dL (H)).   Assessment: Pharmacy consulted for electrolyte monitoring and replacement in 77 yo male admitted to CCU with Severe malnutrition, AMS and possible PNA. Patient intubated on 8/3.  Patient is high risk for refeeding syndrome.   Goal of Therapy:  Electrolytes WNL K: > 4 mmol/L Mg: ~2 mg/dL  Plan:  8/4  KCL 28mEq x 6  And Magnesium 2g IV x 1 dose ordered.  Will order KPhos 76mmol IV x 1 dose.  Recheck K+ @ 1800 and all electrolytes with AM labs.   Pharmacy will continue to follow and replace as needed.   Pernell Dupre, PharmD, BCPS Clinical Pharmacist 12/01/2017 1:03 PM

## 2017-12-01 NOTE — Progress Notes (Addendum)
Pharmacy Antibiotic Note  Marc [name] is a 77 y.o. male admitted on 11/29/2017 with meningitis (presumed).  Pharmacy has been consulted for vancomycin dosing.  Plan: SCr has increased from 0.9 to 1.4 mg/dl. Empirically reduce vancomycin to 1 gm IV Q24H. Ampicillin reduced to 2 gm IV Q6H. Acyclovir reduced to 10 mg/kg IV Q12H. These changes were made per P&T renal antibiotic dosing policy.   Height: 5\' 9"  (175.3 cm) Weight: 125 lb (56.7 kg) IBW/kg (Calculated) : 70.7  Temp (24hrs), Avg:97.5 F (36.4 C), Min:95.1 F (35.1 C), Max:101.7 F (38.7 C)  Recent Labs  Lab 11/29/17 1649 11/29/17 2332 11/30/17 0323 12/01/17 0520  WBC 11.5*  --   --  13.1*  CREATININE 0.91  --  0.90 1.40*  LATICACIDVEN  --  0.8 1.0  --     Estimated Creatinine Clearance: 36.6 mL/min (A) (by C-G formula based on SCr of 1.4 mg/dL (H)).    Allergies  Allergen Reactions  . Glimepiride   . Tape Itching and Rash    Antimicrobials this admission:   Dose adjustments this admission:   Microbiology results:  BCx:   UCx:    Sputum:    MRSA PCR:   Thank you for allowing pharmacy to be a part of this patient's care.  Laural Benes, Pharm.D., BCPS Clinical Pharmacist 12/01/2017 8:14 AM

## 2017-12-01 NOTE — Progress Notes (Addendum)
Pt having frequent PVCs per central telemetry. Pt had low serum potassium, phosphorus, and magnesium earlier today. Potassium and magnesium replaced. Phosphorus currently being replaced. Dr. Jefferson Fuel notified and stated to recheck bmp with phosphorus and magnesium levels now. Orders placed in epic.   Update 1830: pt's serum electrolytes came back WNL. Dr. Jefferson Fuel notified as pt continues to have frequent PVCs. No new orders received. Will continue to monitor on continuous telemetry.

## 2017-12-01 NOTE — Progress Notes (Signed)
[facility] [place] NAME: Marc [name]    MR#:  [id number]  DATE OF BIRTH:  01/17/1941  SUBJECTIVE:   Patient mains intubated and sedated on ventilator.  Patient is on antibiotics for possible meningitis.  MRI is pending.  REVIEW OF SYSTEMS:    Intubated and sedated  Tolerating Diet: npo      DRUG ALLERGIES:   Allergies  Allergen Reactions  . Glimepiride   . Tape Itching and Rash    VITALS:  Blood pressure (!) 142/92, pulse 98, temperature 99.3 F (37.4 C), temperature source Core, resp. rate 16, height 5\' 9"  (1.753 m), weight 125 lb (56.7 kg), SpO2 100 %.  PHYSICAL EXAMINATION:  Constitutional: Appears critically ill-appearing and intubated HENT: Normocephalic. . Intubated Eyes: Conjunctivaeare normal.  Sluggish pupils   neck:. No JVD. No tracheal deviation. CVS: RRR, S1/S2 +, no murmurs, no gallops, no carotid bruit.  Pulmonary: Effort and breath sounds normal, no stridor, rhonchi, wheezes, rales.  Abdominal: Soft. BS +,  no distension, tenderness, rebound or guarding.  Musculoskeletal: N No edema and no tenderness.  Neuro: Sedated  skin: Skin is warm and dry. No rash noted. Psychiatric: Sedated    LABORATORY PANEL:   CBC Recent Labs  Lab 12/01/17 0520  WBC 13.1*  HGB 12.3*  HCT 35.0*  PLT 118*   ------------------------------------------------------------------------------------------------------------------  Chemistries  Recent Labs  Lab 11/29/17 1649  12/01/17 0520  NA 142   < > 137  K 4.2   < > 2.6*  CL 96*   < > 99  CO2 37*   < > 28  GLUCOSE 339*   < > 166*  BUN 24*   < > 34*  CREATININE 0.91   < > 1.40*  CALCIUM 9.6   < > 7.5*  MG  --    < > 1.6*  AST 22  --   --   ALT 30  --   --   ALKPHOS 71  --   --   BILITOT 0.7  --   --    < > = values in this interval not displayed.    ------------------------------------------------------------------------------------------------------------------  Cardiac Enzymes Recent Labs  Lab 11/29/17 1649  TROPONINI <0.03   ------------------------------------------------------------------------------------------------------------------  RADIOLOGY:  Dg Chest 1 View  Result Date: 11/30/2017 CLINICAL DATA:  o2 decrease EXAM: CHEST  1 VIEW COMPARISON:  11/30/2017 FINDINGS: Endotracheal tube is in place with tip approximately 3.9 centimeters above the carina. Nasogastric tube is in place, tip coiled back upon itself into the gastric fundal region. There is volume loss at the LEFT lung base, obscuring the LEFT hemidiaphragm. RIGHT lung is clear. No pulmonary edema. IMPRESSION: Persistent significant atelectasis and/or consolidation of the LEFT LOWER lobe. Endotracheal tube and nasogastric tube in good position. No edema. Electronically Signed   By: Nolon Nations M.D.   On: 11/30/2017 18:50   Ct Head Wo Contrast  Result Date: 11/29/2017 CLINICAL DATA:  Unexplained weight loss for several months. Decreased appetite and increasing weakness with unclear speech. Dizziness today. EXAM: CT HEAD WITHOUT CONTRAST TECHNIQUE: Contiguous axial images were obtained from the base of the skull through the vertex without intravenous contrast. COMPARISON:  Head CT and MRI brain 02/16/2015. FINDINGS: Brain: There is no evidence of acute intracranial hemorrhage, mass lesion, brain edema or extra-axial fluid collection. The ventricles and subarachnoid spaces are appropriately sized for age. There is no CT evidence of acute cortical infarction. Stable minimal periventricular white matter disease. Small  lacune or perivascular space in the left lentiform nucleus. Vascular: Mild intracranial vascular calcifications. No hyperdense vessel identified. Skull: Negative for fracture or focal lesion. Sinuses/Orbits: The visualized paranasal sinuses and mastoid air cells  are clear. No orbital abnormalities are seen. Other: None. IMPRESSION: No acute intracranial findings. No explanation for the patient's symptoms. Electronically Signed   By: Richardean Sale M.D.   On: 11/29/2017 18:27   Ct Chest W Contrast  Result Date: 11/30/2017 CLINICAL DATA:  Altered mental status. Left pleural effusion on x-[place] EXAM: CT CHEST WITH CONTRAST TECHNIQUE: Multidetector CT imaging of the chest was performed during intravenous contrast administration. CONTRAST:  14mL OMNIPAQUE IOHEXOL 300 MG/ML  SOLN COMPARISON:  X-[place] earlier today FINDINGS: Cardiovascular: The heart is upper normal to mildly enlarged. No pericardial effusion. Coronary artery calcification is evident. Atherosclerotic calcification is noted in the wall of the thoracic aorta. Mediastinum/Nodes: No mediastinal lymphadenopathy. There is no hilar lymphadenopathy. The esophagus has normal imaging features. There is no axillary lymphadenopathy. Lungs/Pleura: There is minimal dependent mucus in the trachea. Right lower lobe collapse/consolidation evident. Collapse/consolidation also noted left lower lobe. No pleural effusion. Upper Abdomen: There may be a trace amount of fluid along the dome of the spleen. Trace sub pulmonic effusion would also be a consideration. Musculoskeletal: Expansile lytic foci are identified in the T11 vertebral body but appear to be isolated to this level. These are present on a study from 12/06/2010 and are unchanged. Tiny lucent foci are noted in the manubrium and sternum, indeterminate. IMPRESSION: 1. Bilateral lower lobe collapse/consolidation. 2. Possible tiny left pleural effusion versus a trace amount of fluid along the dome of the spleen. 3.  Aortic Atherosclerois (ICD10-170.0) Electronically Signed   By: Misty Stanley M.D.   On: 11/30/2017 02:48   Dg Chest Portable 1 View  Result Date: 11/30/2017 CLINICAL DATA:  Endotracheal tube placement and OG tube placement. Unexplained weight loss. Increased  weakness. Became unresponsive this morning. EXAM: PORTABLE CHEST 1 VIEW COMPARISON:  Chest x-Darus dated 11/30/2017. FINDINGS: OG tube is adequately positioned in the stomach with tip directed towards the stomach fundus. Endotracheal tube is adequately positioned with tip approximately 5 cm above the carina. Dense consolidation at the LEFT lung base, as seen on today's earlier chest CT, atelectasis versus pneumonia. Mild atelectasis at the RIGHT lung base, as also seen on today's earlier chest CT. No new lung findings in the short-term interval. No pneumothorax seen. IMPRESSION: 1. Endotracheal tube is adequately positioned with tip approximately 5 cm above the carina. 2. OG tube is adequately positioned in the stomach with tip directed towards the stomach fundus. 3. Bibasilar consolidations, better demonstrated on today's earlier chest CT, compatible with atelectasis, pneumonia or aspiration. Electronically Signed   By: Franki Cabot M.D.   On: 11/30/2017 09:53   Dg Chest Port 1 View  Result Date: 11/30/2017 CLINICAL DATA:  Weakness. EXAM: PORTABLE CHEST 1 VIEW COMPARISON:  None. FINDINGS: Lung volumes are low. Patchy left basilar opacity with left pleural effusion. Normal heart size and mediastinal contours allowing for technique and rotation. Mild right infrahilar atelectasis. No pulmonary edema. No pneumothorax. IMPRESSION: Patchy left basilar opacity and left pleural effusion. Considerations include effusion causing adjacent atelectasis or pneumonia with parapneumonic effusion. Recommend radiographic follow-up. Electronically Signed   By: Jeb Levering M.D.   On: 11/30/2017 00:18   [place] Ekg Site Rite  Result Date: 11/30/2017 If Site Rite image not attached, placement could not be confirmed due to current cardiac rhythm.  ASSESSMENT AND PLAN:   77 year old male with history of essential hypertension who presented to the emergency room initially due to generalized weakness and weight loss.   1.   Acute hypoxic respiratory failure due to inability to protect airway/responsiveness and patient is now intubated by ED physician. Continue vent management as per intensivist    2.  Hypothermia:  Continue broad-spectrum antibiotics  Follow-up on cultures  TSH was within normal limits   3.  Acute encephalopathy of unclear etiology:  CT head without acute pathology MRI brain pending Continue antibiotics for possible meningitis Neurology consultation EEG Consider LP  4.  Weight loss: Work-up after patient is more stable  5.  Diabetes with A1c of 7.4 Sliding scale  6.  H. pylori gastritis: Continue triple therapy once patient is able to take n.p.o. 7.  Hypotension after intubation: Weaning pressors  CODE STATUS: FULL  TOTAL TIME TAKING CARE OF THIS PATIENT: 24 minutes.   Consider palliative care consultation if no improvement after the next 48 hours.   POSSIBLE D/C ??, DEPENDING ON CLINICAL CONDITION.   Shenetta Schnackenberg M.D on 12/01/2017 at 8:57 AM  Between 7am to 6pm - Pager - [phone] After 6pm go to [email or web address] - password [email or web address] [facility]  Office  [phone]  CC: Primary care physician; Tracie Harrier, MD  Note: This dictation was prepared with Dragon dictation along with smaller phrase technology. Any transcriptional errors that result from this process are unintentional.

## 2017-12-01 NOTE — Progress Notes (Signed)
Initial Nutrition Assessment  DOCUMENTATION CODES:   Severe malnutrition in context of chronic illness, Underweight  INTERVENTION:  Initiate Vital High Protein at 15 mL/hr.  If electrolytes are within acceptable range on 8/5 can begin advancing by 20 mL/hr every 8 hours to goal regimen of Vital High Protein at 55 mL/hr (1320 mL goal daily volume). Provides 1320 kcal, 116 grams of protein, 1109 mL H2O daily. With current propofol rate provides 1769 kcal daily.  Provide liquid MVI daily per tube as goal TF regimen does not meet 100% RDIs for vitamins/minerals.  Provide minimum free water flush of 30 mL Q4hrs for now as patient is receiving 1/2NS at 75 mL/hr.  Monitor magnesium, potassium, and phosphorus daily for at least 3 days, MD to replete as needed, as pt is at risk for refeeding syndrome given severe malnutrition. Recommend pharmacy consult for electrolyte management.  NUTRITION DIAGNOSIS:   Severe Malnutrition related to chronic illness(etiology unknown at this time) as evidenced by moderate fat depletion, severe fat depletion, moderate muscle depletion, severe muscle depletion.  GOAL:   Provide needs based on ASPEN/SCCM guidelines  MONITOR:   Vent status, Labs, Weight trends, TF tolerance, Skin, I & O's  REASON FOR ASSESSMENT:   Ventilator    ASSESSMENT:   77 year old male with PMHx of HTN, HLD, anxiety, BPH, hx of colon adenomas, diverticulosis who was admitted with AMS of unclear etiology, required intubation on 8/3 for airway protection, possible PNA.   Patient is intubated and sedated. On PRVC mode with FiO2 40%. Wife at bedside. She reports patient has had a poor appetite for almost a year now. He has only been eating 2 small meals per day now. She reports he was 152 lbs September 2018, 139 lbs March 2019, and 128 lbs a few weeks ago. On admission patient was 56.7 kg (125 lbs). Limited weight history in chart to trend. She reports he ambulates with cane  recently.  Access: 16 Fr. OGT placed 8/3; tip coils back upon itself and terminates in gastric fundal region per chest x-[place] 8/3  MAP: 74-107 mmHg  Patient is currently intubated on ventilator support Ve: 8.4 L/min Temp (24hrs), Avg:100.3 F (37.9 C), Min:98.2 F (36.8 C), Max:101.7 F (38.7 C)  Propofol: 17 ml/hr (449 kcal daily)  Medications reviewed and include: Novolog 0-9 units Q4hrs (received 4 units since order initiated last night), pantoprazole, 1/2NS at 75 mL/hr, acyclovir, ampicillin, ceftriaxone, norepinephrine gtt at 3 mcg/min, potassium chloride 10 mEq IV 6 times today, propofol gtt, vancomycin.  Labs reviewed: CBG 91-149, Potassium 2.6, BUN 34, Creatinine 1.4, Magnesium 1.6, eGFR 48. Triglycerides 141 on 8/3.  I/O: 620 mL UOP yesterday (0.5 mL/kg/hr)  Discussed with RN and MD. Plan is to start tube feeds today.  NUTRITION - FOCUSED PHYSICAL EXAM:    Most Recent Value  Orbital Region  Moderate depletion  Upper Arm Region  Severe depletion  Thoracic and Lumbar Region  Severe depletion  Buccal Region  Unable to assess  Temple Region  Moderate depletion  Clavicle Bone Region  Moderate depletion  Clavicle and Acromion Bone Region  Moderate depletion  Scapular Bone Region  Unable to assess  Dorsal Hand  Unable to assess  Patellar Region  Severe depletion  Anterior Thigh Region  Severe depletion  Posterior Calf Region  Severe depletion  Edema (RD Assessment)  None  Hair  Reviewed  Eyes  Unable to assess  Mouth  Unable to assess  Skin  Reviewed  Nails  Reviewed  Diet Order:   Diet Order           Diet NPO time specified  Diet effective now          EDUCATION NEEDS:   No education needs have been identified at this time  Skin:  Skin Assessment: Reviewed RN Assessment(blancheable redness to sacrum)  Last BM:  Unknown/PTA  Height:   Ht Readings from Last 1 Encounters:  11/30/17 5' 9"  (1.753 m)    Weight:   Wt Readings from Last 1  Encounters:  11/30/17 125 lb (56.7 kg)    Ideal Body Weight:  72.7 kg  BMI:  Body mass index is 18.46 kg/m.  Estimated Nutritional Needs:   Kcal:  8937 (PSU 2003b w/ MSJ 1298, Ve 8.4, Tmax 38.7)  Protein:  100-115 grams (1.8-2 grams/kg)  Fluid:  1.4-1.7 L/day (25-30 mL/kg)  Willey Blade, MS, RD, LDN Office: [phone] Pager: [phone] After Hours/Weekend Pager: [phone]

## 2017-12-01 NOTE — Progress Notes (Signed)
Pt transported to and from MRI via transport vent without incident, pt placed back on Servoi vent with resting settings plugged into red outlet.

## 2017-12-01 NOTE — Progress Notes (Addendum)
MEDICATION RELATED CONSULT NOTE - INITIAL   Pharmacy Consult for Electrolytes  Indication: electrolyte replacement   Allergies  Allergen Reactions  . Glimepiride   . Tape Itching and Rash    Patient Measurements: Height: 5\' 9"  (175.3 cm) Weight: 132 lb 4.4 oz (60 kg) IBW/kg (Calculated) : 70.7 Adjusted Body Weight:   Vital Signs: Temp: 97.7 F (36.5 C) (08/04 1559) Temp Source: Axillary (08/04 1559) BP: 120/92 (08/04 1845) Pulse Rate: 85 (08/04 1845) Intake/Output from previous day: 08/03 0701 - 08/04 0700 In: 2371.7 [I.V.:745.4; IV Piggyback:1626.3] Out: 920 [Urine:620] Intake/Output from this shift: Total I/O In: 2360.9 [I.V.:1204.8; NG/GT:3.5; IV Piggyback:1152.7] Out: 435 [Urine:375; Emesis/NG output:60]  Labs: Recent Labs    11/29/17 1649 11/30/17 0323 12/01/17 0520 12/01/17 1728  WBC 11.5*  --  13.1*  --   HGB 15.7  --  12.3*  --   HCT 46.1  --  35.0*  --   PLT 131*  --  118*  --   CREATININE 0.91 0.90 1.40* 1.37*  MG  --  2.0 1.6* 2.2  PHOS  --  6.5* 2.0* 2.8  ALBUMIN 4.5  --   --   --   PROT 7.0  --   --   --   AST 22  --   --   --   ALT 30  --   --   --   ALKPHOS 71  --   --   --   BILITOT 0.7  --   --   --    Estimated Creatinine Clearance: 39.5 mL/min (A) (by C-G formula based on SCr of 1.37 mg/dL (H)).   Microbiology: Recent Results (from the past 720 hour(s))  Urine culture     Status: Abnormal   Collection Time: 11/29/17 11:32 PM  Result Value Ref Range Status   Specimen Description   Final    URINE, RANDOM Performed at [facility], [address] [place], [place] [address]    Special Requests   Final    NONE Performed at [facility], [address] [place], [place] [address]    Culture (A)  Final    <10,000 COLONIES/mL INSIGNIFICANT GROWTH Performed at [facility] [address] [place], [place] [address]    Report Status 12/01/2017 FINAL  Final  Blood Culture (routine x 2)     Status: None  (Preliminary result)   Collection Time: 11/29/17 11:32 PM  Result Value Ref Range Status   Specimen Description BLOOD LEFT ANTECUBITAL  Final   Special Requests   Final    BOTTLES DRAWN AEROBIC AND ANAEROBIC Blood Culture adequate volume   Culture   Final    NO GROWTH 2 DAYS Performed at [facility], [address] [place], [place] [address]    Report Status PENDING  Incomplete  Blood Culture (routine x 2)     Status: None (Preliminary result)   Collection Time: 11/29/17 11:32 PM  Result Value Ref Range Status   Specimen Description BLOOD RIGHT ANTECUBITAL  Final   Special Requests   Final    BOTTLES DRAWN AEROBIC AND ANAEROBIC Blood Culture adequate volume   Culture   Final    NO GROWTH 2 DAYS Performed at [facility], [address] [place], [place] [address]    Report Status PENDING  Incomplete  MRSA PCR Screening     Status: None   Collection Time: 11/30/17 11:59 AM  Result Value Ref Range Status  MRSA by PCR NEGATIVE NEGATIVE Final    Comment:        The GeneXpert MRSA Assay (FDA approved for NASAL specimens only), is one component of a comprehensive MRSA colonization surveillance program. It is not intended to diagnose MRSA infection nor to guide or monitor treatment for MRSA infections. Performed at [facility], [address], [place], [place] [address]     Medical History: Past Medical History:  Diagnosis Date  . Anxiety   . BPH (benign prostatic hyperplasia)   . Colon adenomas    history of  . Diverticulosis   . Hyperlipidemia   . Hypertension     Medications:   Assessment: Pharmacy consulted for electrolyte monitoring and replacement in 77 yo male admitted to CCU with Severe malnutrition, AMS and possible PNA. Patient intubated on 8/3.  Patient is high risk for refeeding syndrome.   Goal of Therapy:  Electrolytes WNL K: > 4 mmol/L Mg: ~2 mg/dL     Plan:  8/4  KCL 39mEq x 6  And Magnesium 2g IV x 1 dose  ordered.  Will order KPhos 37mmol IV x 1 dose.  Recheck K+ @ 1800 and all electrolytes with AM labs.   8/4:  K @ 17:30 = 3.5 Will recheck electrolytes on 8/5 with AM labs.     Sharada Albornoz D 12/01/2017,6:49 PM

## 2017-12-01 NOTE — Progress Notes (Signed)
Follow up - Critical Care Medicine Note  Patient Details:    Marc [name] is an 77 y.o. male.with a past medical history of hypertension, hyperlipidemia, BPH, colonic adenomas,anxiety, diverticulosis,admitted with altered mental status, lethargy and generalized weakness. Family states that he has been undergoing workup for 30-40 pound weight loss over the past 4 months with decreasing appetite, poor by mouth intake and dehydration. He has had CT scan of the abdomen and pelvis, MRI of the spine, EGD which was remarkable for Barrett's esophagus and H. Pylori gastritis. CT scan of the head was performed which did not reveal any acute intracranial abnormality. His mental status continued to deteriorate, in emergency department he was noted to be restless, agitated, pulling at lines and Foley catheter and subsequently required intubation for airway protection.    Lines, Airways, Drains: Airway 7.5 mm (Active)  Secured at (cm) 24 cm 12/01/2017  3:33 AM  Measured From Lips 12/01/2017  4:00 AM  Secured Location Right 12/01/2017  4:00 AM  Secured By [employer] 12/01/2017  4:00 AM  Tube Holder Repositioned Yes 12/01/2017 12:33 AM  Cuff Pressure (cm H2O) 26 cm H2O 12/01/2017  3:33 AM  Site Condition Dry 12/01/2017  4:00 AM     PICC Triple Lumen 63/87/56 PICC Right Basilic 40 cm 0 cm (Active)  Indication for Insertion or Continuance of Line Vasoactive infusions;Prolonged intravenous therapies 11/30/2017  7:46 PM  Exposed Catheter (cm) 0 cm 11/30/2017  7:46 PM  Site Assessment Clean;Dry;Intact 11/30/2017  7:46 PM  Lumen #1 Status Flushed;Saline locked;Blood return noted 11/30/2017  7:46 PM  Lumen #2 Status Flushed;Saline locked;Blood return noted 11/30/2017  7:46 PM  Lumen #3 Status Flushed;Saline locked;Blood return noted 11/30/2017  7:46 PM  Dressing Type Transparent 11/30/2017  7:46 PM  Dressing Status Clean;Dry;Intact 11/30/2017  8:00 PM  Line Care Lumen 1 tubing changed;Lumen 2 tubing changed;Lumen 3 tubing changed  11/30/2017  8:00 PM  Dressing Intervention New dressing 11/30/2017  7:46 PM  Dressing Change Due 12/07/17 11/30/2017  7:46 PM     NG/OG Tube Orogastric 16 Fr. Center mouth Aucultation;Xray Measured external length of tube (Active)  Site Assessment Clean;Dry;Intact 12/01/2017  4:00 AM  Ongoing Placement Verification No change in cm markings or external length of tube from initial placement 12/01/2017  4:00 AM  Status Suction-low intermittent 12/01/2017  4:00 AM  Drainage Appearance Clear;Green 12/01/2017  4:00 AM  Intake (mL) 0 mL 11/30/2017  3:25 PM  Output (mL) 0 mL 11/30/2017  4:32 PM     Urethral Catheter Coral Ceo  Non-latex;Temperature probe (Active)  Indication for Insertion or Continuance of Catheter Unstable critical patients (first 24-48 hours) 12/01/2017  4:00 AM  Site Assessment Intact 12/01/2017  4:00 AM  Catheter Maintenance Bag below level of bladder;Catheter secured;Drainage bag/tubing not touching floor;Insertion date on drainage bag;No dependent loops;Seal intact 12/01/2017  4:00 AM  Collection Container Standard drainage bag 12/01/2017  4:00 AM  Securement Method Securing device (Describe) 12/01/2017  4:00 AM  Urinary Catheter Interventions Unclamped 12/01/2017  4:00 AM  Output (mL) 220 mL 12/01/2017  5:15 AM    Anti-infectives:  Anti-infectives (From admission, onward)   Start     Dose/Rate Route Frequency Ordered Stop   11/30/17 1400  ampicillin (OMNIPEN) 2 g in sodium chloride 0.9 % 100 mL IVPB     2 g 300 mL/hr over 20 Minutes Intravenous Every 4 hours 11/30/17 1248     11/30/17 1400  acyclovir (ZOVIRAX) 565 mg in dextrose 5 % 100  mL IVPB     10 mg/kg  56.7 kg 111.3 mL/hr over 60 Minutes Intravenous Every 8 hours 11/30/17 1248     11/30/17 1400  vancomycin (VANCOCIN) IVPB 1000 mg/200 mL premix     1,000 mg 200 mL/hr over 60 Minutes Intravenous Every 18 hours 11/30/17 1303     11/30/17 1300  cefTRIAXone (ROCEPHIN) 2 g in sodium chloride 0.9 % 100 mL IVPB     2 g 200 mL/hr over 30 Minutes  Intravenous Every 12 hours 11/30/17 1248     11/30/17 1000  amoxicillin (AMOXIL) capsule 1,000 mg  Status:  Discontinued     1,000 mg Oral Every 12 hours 11/30/17 0846 11/30/17 1303   11/30/17 1000  clarithromycin (BIAXIN) tablet 500 mg  Status:  Discontinued     500 mg Oral Every 12 hours 11/30/17 0846 11/30/17 1303   11/30/17 0800  vancomycin (VANCOCIN) IVPB 750 mg/150 ml premix  Status:  Discontinued     750 mg 150 mL/hr over 60 Minutes Intravenous Every 12 hours 11/30/17 0058 11/30/17 0829   11/30/17 0800  piperacillin-tazobactam (ZOSYN) IVPB 3.375 g  Status:  Discontinued     3.375 g 12.5 mL/hr over 240 Minutes Intravenous Every 8 hours 11/30/17 0058 11/30/17 1421   11/29/17 2300  piperacillin-tazobactam (ZOSYN) IVPB 3.375 g     3.375 g 100 mL/hr over 30 Minutes Intravenous  Once 11/29/17 2255 11/30/17 0050   11/29/17 2300  vancomycin (VANCOCIN) IVPB 1000 mg/200 mL premix     1,000 mg 200 mL/hr over 60 Minutes Intravenous  Once 11/29/17 2255 11/30/17 0159   11/29/17 2200  clarithromycin (BIAXIN) tablet 500 mg  Status:  Discontinued     500 mg Oral Every 12 hours 11/29/17 2123 11/30/17 0846   11/29/17 2130  amoxicillin (AMOXIL) capsule 1,000 mg  Status:  Discontinued     1,000 mg Oral  Once 11/29/17 2123 11/30/17 0847   11/29/17 0000  amoxicillin (AMOXIL) 500 MG tablet     1,000 mg Oral 2 times daily 11/29/17 2125 12/13/17 2359   11/29/17 0000  clarithromycin (BIAXIN) 500 MG tablet     500 mg Oral 2 times daily 11/29/17 2125 12/13/17 2359      Microbiology: Results for orders placed or performed during the hospital encounter of 11/29/17  Blood Culture (routine x 2)     Status: None (Preliminary result)   Collection Time: 11/29/17 11:32 PM  Result Value Ref Range Status   Specimen Description BLOOD LEFT ANTECUBITAL  Final   Special Requests   Final    BOTTLES DRAWN AEROBIC AND ANAEROBIC Blood Culture adequate volume   Culture   Final    NO GROWTH 2 DAYS Performed at [facility], [address] [place], [place] [address]    Report Status PENDING  Incomplete  Blood Culture (routine x 2)     Status: None (Preliminary result)   Collection Time: 11/29/17 11:32 PM  Result Value Ref Range Status   Specimen Description BLOOD RIGHT ANTECUBITAL  Final   Special Requests   Final    BOTTLES DRAWN AEROBIC AND ANAEROBIC Blood Culture adequate volume   Culture   Final    NO GROWTH 2 DAYS Performed at [facility], [address], [place], [place] [address]    Report Status PENDING  Incomplete  MRSA PCR Screening     Status: None   Collection Time: 11/30/17 11:59 AM  Result Value Ref Range Status   MRSA by PCR  NEGATIVE NEGATIVE Final    Comment:        The GeneXpert MRSA Assay (FDA approved for NASAL specimens only), is one component of a comprehensive MRSA colonization surveillance program. It is not intended to diagnose MRSA infection nor to guide or monitor treatment for MRSA infections. Performed at [facility], [address] [place], [place] [address]     Studies: Dg Chest 1 View  Result Date: 11/30/2017 CLINICAL DATA:  o2 decrease EXAM: CHEST  1 VIEW COMPARISON:  11/30/2017 FINDINGS: Endotracheal tube is in place with tip approximately 3.9 centimeters above the carina. Nasogastric tube is in place, tip coiled back upon itself into the gastric fundal region. There is volume loss at the LEFT lung base, obscuring the LEFT hemidiaphragm. RIGHT lung is clear. No pulmonary edema. IMPRESSION: Persistent significant atelectasis and/or consolidation of the LEFT LOWER lobe. Endotracheal tube and nasogastric tube in good position. No edema. Electronically Signed   By: Nolon Nations M.D.   On: 11/30/2017 18:50   Ct Head Wo Contrast  Result Date: 11/29/2017 CLINICAL DATA:  Unexplained weight loss for several months. Decreased appetite and increasing weakness with unclear speech. Dizziness today. EXAM: CT HEAD WITHOUT CONTRAST  TECHNIQUE: Contiguous axial images were obtained from the base of the skull through the vertex without intravenous contrast. COMPARISON:  Head CT and MRI brain 02/16/2015. FINDINGS: Brain: There is no evidence of acute intracranial hemorrhage, mass lesion, brain edema or extra-axial fluid collection. The ventricles and subarachnoid spaces are appropriately sized for age. There is no CT evidence of acute cortical infarction. Stable minimal periventricular white matter disease. Small lacune or perivascular space in the left lentiform nucleus. Vascular: Mild intracranial vascular calcifications. No hyperdense vessel identified. Skull: Negative for fracture or focal lesion. Sinuses/Orbits: The visualized paranasal sinuses and mastoid air cells are clear. No orbital abnormalities are seen. Other: None. IMPRESSION: No acute intracranial findings. No explanation for the patient's symptoms. Electronically Signed   By: Richardean Sale M.D.   On: 11/29/2017 18:27   Ct Chest W Contrast  Result Date: 11/30/2017 CLINICAL DATA:  Altered mental status. Left pleural effusion on x-Kenrick EXAM: CT CHEST WITH CONTRAST TECHNIQUE: Multidetector CT imaging of the chest was performed during intravenous contrast administration. CONTRAST:  66mL OMNIPAQUE IOHEXOL 300 MG/ML  SOLN COMPARISON:  X-Haik earlier today FINDINGS: Cardiovascular: The heart is upper normal to mildly enlarged. No pericardial effusion. Coronary artery calcification is evident. Atherosclerotic calcification is noted in the wall of the thoracic aorta. Mediastinum/Nodes: No mediastinal lymphadenopathy. There is no hilar lymphadenopathy. The esophagus has normal imaging features. There is no axillary lymphadenopathy. Lungs/Pleura: There is minimal dependent mucus in the trachea. Right lower lobe collapse/consolidation evident. Collapse/consolidation also noted left lower lobe. No pleural effusion. Upper Abdomen: There may be a trace amount of fluid along the dome of the  spleen. Trace sub pulmonic effusion would also be a consideration. Musculoskeletal: Expansile lytic foci are identified in the T11 vertebral body but appear to be isolated to this level. These are present on a study from 12/06/2010 and are unchanged. Tiny lucent foci are noted in the manubrium and sternum, indeterminate. IMPRESSION: 1. Bilateral lower lobe collapse/consolidation. 2. Possible tiny left pleural effusion versus a trace amount of fluid along the dome of the spleen. 3.  Aortic Atherosclerois (ICD10-170.0) Electronically Signed   By: Misty Stanley M.D.   On: 11/30/2017 02:48   Dg Chest Portable 1 View  Result Date: 11/30/2017 CLINICAL DATA:  Endotracheal tube placement and OG  tube placement. Unexplained weight loss. Increased weakness. Became unresponsive this morning. EXAM: PORTABLE CHEST 1 VIEW COMPARISON:  Chest x-Tuvia dated 11/30/2017. FINDINGS: OG tube is adequately positioned in the stomach with tip directed towards the stomach fundus. Endotracheal tube is adequately positioned with tip approximately 5 cm above the carina. Dense consolidation at the LEFT lung base, as seen on today's earlier chest CT, atelectasis versus pneumonia. Mild atelectasis at the RIGHT lung base, as also seen on today's earlier chest CT. No new lung findings in the short-term interval. No pneumothorax seen. IMPRESSION: 1. Endotracheal tube is adequately positioned with tip approximately 5 cm above the carina. 2. OG tube is adequately positioned in the stomach with tip directed towards the stomach fundus. 3. Bibasilar consolidations, better demonstrated on today's earlier chest CT, compatible with atelectasis, pneumonia or aspiration. Electronically Signed   By: Franki Cabot M.D.   On: 11/30/2017 09:53   Dg Chest Port 1 View  Result Date: 11/30/2017 CLINICAL DATA:  Weakness. EXAM: PORTABLE CHEST 1 VIEW COMPARISON:  None. FINDINGS: Lung volumes are low. Patchy left basilar opacity with left pleural effusion. Normal heart  size and mediastinal contours allowing for technique and rotation. Mild right infrahilar atelectasis. No pulmonary edema. No pneumothorax. IMPRESSION: Patchy left basilar opacity and left pleural effusion. Considerations include effusion causing adjacent atelectasis or pneumonia with parapneumonic effusion. Recommend radiographic follow-up. Electronically Signed   By: Jeb Levering M.D.   On: 11/30/2017 00:18   [place] Ekg Site Rite  Result Date: 11/30/2017 If Site Rite image not attached, placement could not be confirmed due to current cardiac rhythm.   Consults: Treatment Team:  Arta Silence, MD [facility], [facility], MD Hermelinda Dellen, DO   Subjective:    Overnight Issues: patient started to have some improved mental status requiring sedation  Objective:  Vital signs for last 24 hours: Temp:  [95 F (35 C)-101.7 F (38.7 C)] 99.5 F (37.5 C) (08/04 0600) Pulse Rate:  [66-123] 85 (08/04 0500) Resp:  [15-27] 15 (08/04 0500) BP: (55-170)/(37-112) 118/87 (08/04 0500) SpO2:  [86 %-100 %] 99 % (08/04 0500) FiO2 (%):  [40 %-100 %] 60 % (08/04 0333) Weight:  [125 lb (56.7 kg)] 125 lb (56.7 kg) (08/03 1200)  Hemodynamic parameters for last 24 hours:    Intake/Output from previous day: 08/03 0701 - 08/04 0700 In: 2371.7 [I.V.:745.4; IV Piggyback:1626.3] Out: 920 [Urine:620]  Intake/Output this shift: No intake/output data recorded.  Vent settings for last 24 hours: Vent Mode: PRVC FiO2 (%):  [40 %-100 %] 60 % Set Rate:  [16 bmp] 16 bmp Vt Set:  [500 mL] 500 mL PEEP:  [2 cmH20-5 cmH20] 5 cmH20 Plateau Pressure:  [19 cmH20] 19 cmH20  Physical Exam:  Patient is intubated, sedated, elderly appearing Caucasian male Vital signs:       Please see the above listed vital signs HEENT:           No oral lesions appreciated, patient is orally intubated, trachea midline, no thyromegaly appreciated, no jugular venous distention Cardiovascular:           Regular rate and  rhythm Pulmonary:      Coarse rhonchi appreciated Abdominal:      Positive bowel sounds, soft exam Extremities:     Nuclear cyanosis or edema noted Neurologic:      Patient moves all extremities Cutaneous:      No rashes or lesions noted  Assessment/Plan:   Altered mental status. Unclear etiology. Patient has had a negative CAT scan  of the head, pending MRI of the brain. Has been intubated for airway protection. No clinical evidence of seizure activity,will empirically place on vancomycin, Rocephin and ampicillin along with acyclovir pending MRI result will consider neurology consultation in the morning  Respiratory failure. Patient intubated for airway protection. CT scan of the chest does reveal basilar consolidation consistent with possible pneumonia. We'll place on broad-spectrum antibiotic coverage, obtain respiratory culture data, urine for Legionella and pneumococcus  Acid-based derangement. Patient with compensated respiratory acidosis  Mild leukocytosis. Blood urine and sputum culture  Hypokalemia we'll replace  Renal insufficiency predominantly prerenal 34/1.4  Critical Care Total Time*: 30 Minutes  Crislyn Willbanks 12/01/2017  *Care during the described time interval was provided by me and/or other providers on the critical care team.  I have reviewed this patient's available data, including medical history, events of note, physical examination and test results as part of my evaluation.

## 2017-12-02 ENCOUNTER — Other Ambulatory Visit: Payer: PPO

## 2017-12-02 ENCOUNTER — Inpatient Hospital Stay: Payer: PPO

## 2017-12-02 DIAGNOSIS — R4020 Unspecified coma: Secondary | ICD-10-CM

## 2017-12-02 DIAGNOSIS — J9601 Acute respiratory failure with hypoxia: Principal | ICD-10-CM

## 2017-12-02 LAB — COMPREHENSIVE METABOLIC PANEL
ALK PHOS: 44 U/L (ref 38–126)
ALT: 33 U/L (ref 0–44)
ANION GAP: 9 (ref 5–15)
AST: 29 U/L (ref 15–41)
Albumin: 2.6 g/dL — ABNORMAL LOW (ref 3.5–5.0)
BUN: 29 mg/dL — ABNORMAL HIGH (ref 8–23)
CALCIUM: 8.2 mg/dL — AB (ref 8.9–10.3)
CO2: 30 mmol/L (ref 22–32)
Chloride: 104 mmol/L (ref 98–111)
Creatinine, Ser: 1.31 mg/dL — ABNORMAL HIGH (ref 0.61–1.24)
GFR calc Af Amer: 60 mL/min — ABNORMAL LOW (ref >60)
GFR calc non Af Amer: 52 mL/min — ABNORMAL LOW (ref >60)
Glucose, Bld: 140 mg/dL — ABNORMAL HIGH (ref 70–99)
Potassium: 3.2 mmol/L — ABNORMAL LOW (ref 3.5–5.1)
SODIUM: 143 mmol/L (ref 135–145)
Total Bilirubin: 0.7 mg/dL (ref 0.3–1.2)
Total Protein: 4.9 g/dL — ABNORMAL LOW (ref 6.5–8.1)

## 2017-12-02 LAB — CSF CELL COUNT WITH DIFFERENTIAL
EOS CSF: 0 %
Eosinophils, CSF: 0 %
LYMPHS CSF: 0 %
Lymphs, CSF: 81 %
MONOCYTE-MACROPHAGE-SPINAL FLUID: 0 %
Monocyte-Macrophage-Spinal Fluid: 17 %
Other Cells, CSF: 0
Other Cells, CSF: 0
RBC Count, CSF: 127 /mm3 — ABNORMAL HIGH (ref 0–3)
RBC Count, CSF: 5 /mm3 — ABNORMAL HIGH (ref 0–3)
SEGMENTED NEUTROPHILS-CSF: 2 %
SEGMENTED NEUTROPHILS-CSF: 0 %
Tube #: 4
Tube #: 1
WBC CSF: 0 /mm3 (ref 0–5)
WBC, CSF: 3 /mm3 (ref 0–5)

## 2017-12-02 LAB — CBC WITH DIFFERENTIAL/PLATELET
BASOS ABS: 0 10*3/uL (ref 0–0.1)
BASOS PCT: 0 %
Eosinophils Absolute: 0 10*3/uL (ref 0–0.7)
Eosinophils Relative: 0 %
HEMATOCRIT: 38.2 % — AB (ref 40.0–52.0)
Hemoglobin: 12.8 g/dL — ABNORMAL LOW (ref 13.0–18.0)
Lymphocytes Relative: 11 %
Lymphs Abs: 0.8 10*3/uL — ABNORMAL LOW (ref 1.0–3.6)
MCH: 32.1 pg (ref 26.0–34.0)
MCHC: 33.6 g/dL (ref 32.0–36.0)
MCV: 95.7 fL (ref 80.0–100.0)
Monocytes Absolute: 0.6 10*3/uL (ref 0.2–1.0)
Monocytes Relative: 7 %
NEUTROS ABS: 6.5 10*3/uL (ref 1.4–6.5)
Neutrophils Relative %: 82 %
Platelets: 103 10*3/uL — ABNORMAL LOW (ref 150–440)
RBC: 4 MIL/uL — ABNORMAL LOW (ref 4.40–5.90)
RDW: 13.1 % (ref 11.5–14.5)
WBC: 8 10*3/uL (ref 3.8–10.6)

## 2017-12-02 LAB — CRYPTOCOCCAL ANTIGEN, CSF: CRYPTO AG: NEGATIVE

## 2017-12-02 LAB — GLUCOSE, CAPILLARY
GLUCOSE-CAPILLARY: 183 mg/dL — AB (ref 70–99)
Glucose-Capillary: 124 mg/dL — ABNORMAL HIGH (ref 70–99)
Glucose-Capillary: 152 mg/dL — ABNORMAL HIGH (ref 70–99)
Glucose-Capillary: 202 mg/dL — ABNORMAL HIGH (ref 70–99)
Glucose-Capillary: 191 mg/dL — ABNORMAL HIGH (ref 70–99)
Glucose-Capillary: 155 mg/dL — ABNORMAL HIGH (ref 70–99)

## 2017-12-02 LAB — MAGNESIUM: MAGNESIUM: 1.9 mg/dL (ref 1.7–2.4)

## 2017-12-02 LAB — PROTEIN AND GLUCOSE, CSF
Glucose, CSF: 108 mg/dL — ABNORMAL HIGH (ref 40–70)
Total  Protein, CSF: 49 mg/dL — ABNORMAL HIGH (ref 15–45)

## 2017-12-02 LAB — PHOSPHORUS: Phosphorus: 3.7 mg/dL (ref 2.5–4.6)

## 2017-12-02 MED ORDER — SENNOSIDES-DOCUSATE SODIUM 8.6-50 MG PO TABS
2.0000 | ORAL_TABLET | Freq: Two times a day (BID) | ORAL | Status: DC
  Administered 2017-12-02 – 2017-12-03 (×3): 2
  Filled 2017-12-02 (×4): qty 2

## 2017-12-02 MED ORDER — MAGNESIUM SULFATE IN D5W 1-5 GM/100ML-% IV SOLN
1.0000 g | Freq: Once | INTRAVENOUS | Status: AC
  Administered 2017-12-02: 1 g via INTRAVENOUS
  Filled 2017-12-02: qty 100

## 2017-12-02 MED ORDER — VITAL HIGH PROTEIN PO LIQD
1000.0000 mL | ORAL | Status: DC
  Administered 2017-12-02 – 2017-12-03 (×3): 1000 mL

## 2017-12-02 MED ORDER — FAMOTIDINE 20 MG PO TABS
20.0000 mg | ORAL_TABLET | Freq: Every day | ORAL | Status: DC
  Administered 2017-12-02: 20 mg
  Filled 2017-12-02: qty 1

## 2017-12-02 MED ORDER — INSULIN ASPART 100 UNIT/ML ~~LOC~~ SOLN
3.0000 [IU] | SUBCUTANEOUS | Status: DC
  Administered 2017-12-02 – 2017-12-03 (×5): 3 [IU] via SUBCUTANEOUS
  Filled 2017-12-02 (×5): qty 1

## 2017-12-02 MED ORDER — POTASSIUM CHLORIDE 20 MEQ PO PACK
40.0000 meq | PACK | ORAL | Status: AC
  Administered 2017-12-02 (×2): 40 meq via ORAL
  Filled 2017-12-02 (×2): qty 2

## 2017-12-02 NOTE — Progress Notes (Signed)
Dr Mortimer Fries aware of missed beats. No additional orders. Patient desats, turing to side has helped 92 percent.

## 2017-12-02 NOTE — Procedures (Signed)
ELECTROENCEPHALOGRAM REPORT   Patient: Marc [name]       Room #: IC03A-AA EEG No. ID: [id number] Age: 77 y.o.        Sex: male Referring Physician: Mody Report Date:  12/02/2017        Interpreting Physician: Alexis Goodell  History: Marc [name] is an 77 y.o. male with altered mental status  Medications:  Levophed, Propofol, Acyclovir, Pepcid, Insulin, MVI  Conditions of Recording:  This is a 21 channel routine scalp EEG performed with bipolar and monopolar montages arranged in accordance to the international 10/20 system of electrode placement. One channel was dedicated to EKG recording.  The patient is in the intubated and sedated state.  Description:  The background activity is low voltage and consists of periods of polymorphic delta activity alternating with periods of theta activity that at time are somewhat rhythmical and well organized.  this activity persists throughout the recording.  No epileptiform activity is noted.   Hyperventilation and intermittent photic stimulation were not performed.  IMPRESSION: This EEG is characterized by slowing which is consistent with normal drowse.  Can not rule out the possibility of slowing related to general cerebral disturbance such as a metabolic encephalopathy or medication effect.   Clinical correlation recommended.  No epileptiform activity is noted.       Alexis Goodell, MD Neurology [phone] 12/02/2017, 8:25 PM

## 2017-12-02 NOTE — Progress Notes (Signed)
Patients saturations were 83%, Increased FiO2 to 45% and performed a recruitment maneuver. Saturations rose to 97%. Left patient at 45% and will continue to monitor.

## 2017-12-02 NOTE — Progress Notes (Signed)
Notified Darlyn Chamber NP regarding patients right upper arm swollen compared to left. Orders of [place].

## 2017-12-02 NOTE — Progress Notes (Signed)
CHIEF COMPLAINT:   Chief Complaint  Patient presents with  . Weakness  . Dizziness  intubated for resp failure  Subjective  remains critically ill Plan for LP today Severe resp failure unable to protect airway Wean sedation     Objective   Examination: PHYSICAL EXAMINATION:  GENERAL:critically ill appearing, +resp distress HEAD: Normocephalic, atraumatic.  EYES: Pupils equal, round, reactive to light.  No scleral icterus.  MOUTH: Moist mucosal membrane. NECK: Supple. No thyromegaly. No nodules. No JVD.  PULMONARY: +rhonchi, +wheezing CARDIOVASCULAR: S1 and S2. Regular rate and rhythm. No murmurs, rubs, or gallops.  GASTROINTESTINAL: Soft, nontender, -distended. No masses. Positive bowel sounds. No hepatosplenomegaly.  MUSCULOSKELETAL: No swelling, clubbing, or edema.  NEUROLOGIC: obtunded SKIN:intact,warm,dry    VITALS:  height is _0  (1.753 m) and weight is 129 lb 6.6 oz (58.7 kg). His oral temperature is 99.7 F (37.6 C). His blood pressure is 117/72 and his pulse is 119 (abnormal). His respiration is 16 and oxygen saturation is 92%.   I personally reviewed Labs under Results section.  Radiology Reports Dg Chest 1 View  Result Date: 11/30/2017 CLINICAL DATA:  o2 decrease EXAM: CHEST  1 VIEW COMPARISON:  11/30/2017 FINDINGS: Endotracheal tube is in place with tip approximately 3.9 centimeters above the carina. Nasogastric tube is in place, tip coiled back upon itself into the gastric fundal region. There is volume loss at the LEFT lung base, obscuring the LEFT hemidiaphragm. RIGHT lung is clear. No pulmonary edema. IMPRESSION: Persistent significant atelectasis and/or consolidation of the LEFT LOWER lobe. Endotracheal tube and nasogastric tube in good position. No edema. Electronically Signed   By: Nolon Nations M.D.   On: 11/30/2017 18:50   Mr Brain Wo Contrast  Result Date: 12/01/2017 CLINICAL DATA:  Poor appetite. Dehydration. Weakness. Weight loss. EXAM: MRI  HEAD WITHOUT CONTRAST TECHNIQUE: Multiplanar, multiecho pulse sequences of the brain and surrounding structures were obtained without intravenous contrast. COMPARISON:  CT 11/29/2017 FINDINGS: Brain: Mild age related volume loss. Mild small vessel change of the pons and cerebral hemispheric white matter. No evidence of acute or subacute infarction. No mass lesion, hemorrhage, hydrocephalus or extra-axial collection. Vascular: Major vessels at the base of the brain show flow. Skull and upper cervical spine: Negative Sinuses/Orbits: Clear/normal Other: None IMPRESSION: No acute or reversible finding. No cause of the presenting symptoms is identified. Mild age related volume loss and chronic small-vessel change of the pons and hemispheric white matter. Electronically Signed   By: Nelson Chimes M.D.   On: 12/01/2017 14:10   Dg Chest Port 1 View  Result Date: 12/01/2017 CLINICAL DATA:  Check endotracheal tube EXAM: PORTABLE CHEST 1 VIEW COMPARISON:  11/30/2017 FINDINGS: Right-sided PICC line, endotracheal tube and nasogastric catheter are again seen and stable. Persistent changes in the left retrocardiac region are noted although some mild improved aeration is seen. No bony abnormality is noted. IMPRESSION: Improving left basilar infiltrate. Tubes and lines as described. Electronically Signed   By: Inez Catalina M.D.   On: 12/01/2017 11:47   Dg Chest Portable 1 View  Result Date: 11/30/2017 CLINICAL DATA:  Endotracheal tube placement and OG tube placement. Unexplained weight loss. Increased weakness. Became unresponsive this morning. EXAM: PORTABLE CHEST 1 VIEW COMPARISON:  Chest x-Ludie dated 11/30/2017. FINDINGS: OG tube is adequately positioned in the stomach with tip directed towards the stomach fundus. Endotracheal tube is adequately positioned with tip approximately 5 cm above the carina. Dense consolidation at the LEFT lung base, as seen on today's earlier  chest CT, atelectasis versus pneumonia. Mild atelectasis  at the RIGHT lung base, as also seen on today's earlier chest CT. No new lung findings in the short-term interval. No pneumothorax seen. IMPRESSION: 1. Endotracheal tube is adequately positioned with tip approximately 5 cm above the carina. 2. OG tube is adequately positioned in the stomach with tip directed towards the stomach fundus. 3. Bibasilar consolidations, better demonstrated on today's earlier chest CT, compatible with atelectasis, pneumonia or aspiration. Electronically Signed   By: Franki Cabot M.D.   On: 11/30/2017 09:53   [place] Ekg Site Rite  Result Date: 11/30/2017 If Site Rite image not attached, placement could not be confirmed due to current cardiac rhythm.      Assessment/Plan:  77 yo male with acute encephalopathy ?etiology leading to inability to protect airway leading to severe resp failure  Severe Hypoxic and Hypercapnic Respiratory Failure -continue Full MV support -continue Bronchodilator Therapy -Wean Fio2 and PEEP as tolerated -will perform SAT/SBTTwhen respiratory parameters are met    NEUROLOGY - intubated and sedated - minimal sedation to achieve a RASS goal: -1 Plan for LP Follow up neuro recs  DVT/GI prx TF's at goal    Critical Care Time devoted to patient care services described in this note is 34 minutes.   Overall, patient is critically ill, prognosis is guarded.    Corrin Parker, M.D.  [facility]  Medical Director [place] Director [facility] Cardio-Pulmonary Department

## 2017-12-02 NOTE — Progress Notes (Signed)
Pt received on PRVC 500 rate of 14 peep 5 fio2 50 %

## 2017-12-02 NOTE — Progress Notes (Signed)
eeg completed ° °

## 2017-12-02 NOTE — Progress Notes (Signed)
Pharmacy Antibiotic Note  Marc [name] is a 77 y.o. male admitted on 11/29/2017 with meningitis (presumed).  Pharmacy has been consulted for vancomycin dosing. Patient currently on acyclovir 565 mg IV Q8hr. ampicillin 2g IV Q4hr, ceftriaxone 2g IV Q12hr, and vancomycin 1g IV Q24hr.   Plan: Continue vancomycin 1g IV Q24hr for goal trough of 15-20. Will obtain trough prior to dose on 8/6.   Height: 5\' 9"  (175.3 cm) Weight: 129 lb 6.6 oz (58.7 kg) IBW/kg (Calculated) : 70.7  Temp (24hrs), Avg:99 F (37.2 C), Min:98.4 F (36.9 C), Max:99.7 F (37.6 C)  Recent Labs  Lab 11/29/17 1649 11/29/17 2332 11/30/17 0323 12/01/17 0520 12/01/17 1728 12/02/17 0808  WBC 11.5*  --   --  13.1*  --  8.0  CREATININE 0.91  --  0.90 1.40* 1.37* 1.31*  LATICACIDVEN  --  0.8 1.0  --   --   --     Estimated Creatinine Clearance: 40.5 mL/min (A) (by C-G formula based on SCr of 1.31 mg/dL (H)).    Allergies  Allergen Reactions  . Glimepiride   . Tape Itching and Rash    Antimicrobials this admission: Zosyn 8/2 >> 8/3 Vancomycin 8/2 >>  Acyclovir 8/3 >> Ampicillin 8/3 >>  Ceftriaxone 8/3 >>  Dose adjustments this admission: 8/4 Ampicillin renally adjusted from Q4hr to Q6hr.  8/4 Vancomycin empirically adjusted to Q24hr.   Microbiology results: 8/2 BCx: no growth x 3 days  8/2 UCx: < 10K colonies  8/3 MRSA PCR: negative  8/5 CSF culture: pending   Thank you for allowing pharmacy to be a part of this patient's care.  Marc [name] 12/02/2017 7:41 PM

## 2017-12-02 NOTE — Progress Notes (Signed)
Dr Mortimer Fries and Dr Doy Mince notified of patients right pupil larger then left. At this time no additional orders.

## 2017-12-02 NOTE — Progress Notes (Signed)
Inpatient Diabetes Program Recommendations  AACE/ADA: New Consensus Statement on Inpatient Glycemic Control (2019)  Target Ranges:  Prepandial:   less than 140 mg/dL      Peak postprandial:   less than 180 mg/dL (1-2 hours)      Critically ill patients:  140 - 180 mg/dL   Results for Marc [name], Marc [name] (MRN [id number]) as of 12/02/2017 09:48  Ref. Range 12/01/2017 07:36 12/01/2017 11:58 12/01/2017 16:15 12/01/2017 20:09 12/01/2017 23:44 12/02/2017 04:06 12/02/2017 07:53  Glucose-Capillary Latest Ref Range: 70 - 99 mg/dL 137 (H) 156 (H) 180 (H) 220 (H) 170 (H) 183 (H) 124 (H)   Review of Glycemic Control  Diabetes history: DM2 (diet controlled) Outpatient Diabetes medications: None Current orders for Inpatient glycemic control: Novolog 0-9 units Q4H  Inpatient Diabetes Program Recommendations: Insulin-Correction: Please consider discontinuing current Novolog correction and use ICU Glycemic Control order set to order CBGs and Novolog Q4H. Insulin-Tube Feeding Coverage: Noted tube feeding will be started (Vital @ 35-55 mg/hr).  Once tube feeding is at goal, please consider ordering Novolog 3 units Q4H for tube feeding coverage. If tube feeding is stopped or held then Novolog tube feeding coverage would also be stopped or held. HgbA1C: A1C 7.4% on 11/30/17 indicating an average glucose of 166 mg/dl over the past 2-3 months.   NOTE: In reviewing chart, noted in Care Everywhere patient seen PCP on 11/12/17 and it was noted patient has DM2 which is diet controlled. Last A1C in Care Everywhere was 7.5% on 07/16/2017.  Thanks, Barnie Alderman, RN, MSN, CDE Diabetes Coordinator Inpatient Diabetes Program [phone] (Team Pager from 8am to 5pm)

## 2017-12-02 NOTE — Progress Notes (Signed)
Pharmacy Electrolyte/Glucose/Constipation Monitoring Consult:  Pharmacy consulted to assist in monitoring and replacing electrolytes in this 77 y.o. male admitted on 11/29/2017 with Weakness and Dizziness  Patient is currently sedated on propofol.   Labs:  Sodium (mmol/L)  Date Value  12/02/2017 143   Potassium (mmol/L)  Date Value  12/02/2017 3.2 (L)   Magnesium (mg/dL)  Date Value  12/02/2017 1.9   Phosphorus (mg/dL)  Date Value  12/02/2017 3.7   Calcium (mg/dL)  Date Value  12/02/2017 8.2 (L)   Albumin (g/dL)  Date Value  12/02/2017 2.6 (L)    Assessment/Plan: 1. Electrolytes: potassium chloride 53mEq Q4hr VT x 2 doses. Will recheck electrolytes with am labs.   2. Glucose management: continue SSI. Will add Novolog 4units Q4hr for tube feed coverage.   3. Constipation management: will initiate senna/docusate 2 tabs VT BID.   Pharmacy will continue to monitor and adjust per consult.   Simpson,Michael L 12/02/2017 7:34 PM

## 2017-12-02 NOTE — Progress Notes (Signed)
Subjective: Patient intubated and sedated.    Objective: Current vital signs: BP 93/64   Pulse 96   Temp 98.4 F (36.9 C)   Resp 14   Ht _0  (1.753 m)   Wt 58.7 kg (129 lb 6.6 oz)   SpO2 98%   BMI 19.11 kg/m  Vital signs in last 24 hours: Temp:  [98.4 F (36.9 C)-99.7 F (37.6 C)] 98.4 F (36.9 C) (08/05 1530) Pulse Rate:  [93-121] 96 (08/05 1939) Resp:  [0-29] 14 (08/05 1939) BP: (53-143)/(34-90) 93/64 (08/05 1900) SpO2:  [88 %-100 %] 98 % (08/05 1939) FiO2 (%):  [35 %-50 %] 50 % (08/05 1939) Weight:  [58.7 kg (129 lb 6.6 oz)] 58.7 kg (129 lb 6.6 oz) (08/05 0500)  Intake/Output from previous day: 08/04 0701 - 08/05 0700 In: 4931.6 [P.O.:60; I.V.:2837; NG/GT:246.3; IV Piggyback:1788.3] Out: 1035 [Urine:975; Emesis/NG output:60] Intake/Output this shift: No intake/output data recorded. Nutritional status:  Diet Order           Diet NPO time specified  Diet effective now          Neurologic Exam: Mental Status: Patient does not respond to verbal stimuli.  Grimaces to deep sternal rub.  Does not follow commands.  No verbalizations noted.  Cranial Nerves: II: patient does not respond confrontation bilaterally, pupils right 3 mm, left 2 mm,and reactive bilaterally III,IV,VI: doll's response present bilaterally.  V,VII: corneal reflex absent bilaterally  VIII: patient does not respond to verbal stimuli IX,X: gag reflex reduced, XI: trapezius strength unable to test bilaterally XII: tongue strength unable to test Motor: Extremities flaccid throughout.  Occasional twitching noted of the RLE. Sensory: Does not respond to noxious stimuli in any extremity. Deep Tendon Reflexes:  2+ in the upper extremities, 1+ left KJ, 2+ right KJ, 2 AJ's bilaterally.   Plantars: Mute bilaterally Cerebellar: Unable to perform    Lab Results: Basic Metabolic Panel: Recent Labs  Lab 11/29/17 1649 11/30/17 0323 12/01/17 0520 12/01/17 1728 12/02/17 0808  NA 142 146* 137 144  143  K 4.2 4.9 2.6* 3.5 3.2*  CL 96* 101 99 107 104  CO2 37* 37* 28 32 30  GLUCOSE 339* 257* 166* 197* 140*  BUN 24* 22 34* 35* 29*  CREATININE 0.91 0.90 1.40* 1.37* 1.31*  CALCIUM 9.6 9.2 7.5* 8.6* 8.2*  MG  --  2.0 1.6* 2.2 1.9  PHOS  --  6.5* 2.0* 2.8 3.7    Liver Function Tests: Recent Labs  Lab 11/29/17 1649 12/02/17 0808  AST 22 29  ALT 30 33  ALKPHOS 71 44  BILITOT 0.7 0.7  PROT 7.0 4.9*  ALBUMIN 4.5 2.6*   No results for input(s): LIPASE, AMYLASE in the last 168 hours. Recent Labs  Lab 11/30/17 0323  AMMONIA 39*    CBC: Recent Labs  Lab 11/29/17 1649 12/01/17 0520 12/02/17 0808  WBC 11.5* 13.1* 8.0  NEUTROABS  --  9.8* 6.5  HGB 15.7 12.3* 12.8*  HCT 46.1 35.0* 38.2*  MCV 95.9 96.9 95.7  PLT 131* 118* 103*    Cardiac Enzymes: Recent Labs  Lab 11/29/17 1649  TROPONINI <0.03    Lipid Panel: Recent Labs  Lab 11/30/17 1902  TRIG 141    CBG: Recent Labs  Lab 12/02/17 0406 12/02/17 0753 12/02/17 1122 12/02/17 1620 12/02/17 1931  GLUCAP 183* 124* 152* 202* 191*    Microbiology: Results for orders placed or performed during the hospital encounter of 11/29/17  Urine culture     Status:  Abnormal   Collection Time: 11/29/17 11:32 PM  Result Value Ref Range Status   Specimen Description   Final    URINE, RANDOM Performed at [facility], [address], [place], [place] [address]    Special Requests   Final    NONE Performed at [facility], [address] [place], [place] [address]    Culture (A)  Final    <10,000 COLONIES/mL INSIGNIFICANT GROWTH Performed at [facility] [address], [place], [place] [address]    Report Status 12/01/2017 FINAL  Final  Blood Culture (routine x 2)     Status: None (Preliminary result)   Collection Time: 11/29/17 11:32 PM  Result Value Ref Range Status   Specimen Description BLOOD LEFT ANTECUBITAL  Final   Special Requests   Final    BOTTLES DRAWN AEROBIC AND  ANAEROBIC Blood Culture adequate volume   Culture   Final    NO GROWTH 3 DAYS Performed at [facility], [address] [place], [place] [address]    Report Status PENDING  Incomplete  Blood Culture (routine x 2)     Status: None (Preliminary result)   Collection Time: 11/29/17 11:32 PM  Result Value Ref Range Status   Specimen Description BLOOD RIGHT ANTECUBITAL  Final   Special Requests   Final    BOTTLES DRAWN AEROBIC AND ANAEROBIC Blood Culture adequate volume   Culture   Final    NO GROWTH 3 DAYS Performed at [facility], [address] [place], [place] [address]    Report Status PENDING  Incomplete  MRSA PCR Screening     Status: None   Collection Time: 11/30/17 11:59 AM  Result Value Ref Range Status   MRSA by PCR NEGATIVE NEGATIVE Final    Comment:        The GeneXpert MRSA Assay (FDA approved for NASAL specimens only), is one component of a comprehensive MRSA colonization surveillance program. It is not intended to diagnose MRSA infection nor to guide or monitor treatment for MRSA infections. Performed at [facility] [address] [place], [place] [address]   CSF culture     Status: None (Preliminary result)   Collection Time: 12/02/17  9:40 AM  Result Value Ref Range Status   Specimen Description   Final    CSF Performed at [facility] [address] [place], [place] [address]    Special Requests NONE  Final   Gram Stain   Final    RARE WBC SEEN MODERATE RED BLOOD CELLS SAMPLE DRAWN AT 0940 BROUGHT TO LAB AT 1009 SDR Performed at [facility], [address] [place], [place] [address]    Culture PENDING  Incomplete   Report Status PENDING  Incomplete    Coagulation Studies: No results for input(s): LABPROT, INR in the last 72 hours.  Imaging: Mr Brain Wo Contrast  Result Date: 12/01/2017 CLINICAL DATA:  Poor appetite. Dehydration. Weakness. Weight loss. EXAM: MRI HEAD WITHOUT CONTRAST TECHNIQUE:  Multiplanar, multiecho pulse sequences of the brain and surrounding structures were obtained without intravenous contrast. COMPARISON:  CT 11/29/2017 FINDINGS: Brain: Mild age related volume loss. Mild small vessel change of the pons and cerebral hemispheric white matter. No evidence of acute or subacute infarction. No mass lesion, hemorrhage, hydrocephalus or extra-axial collection. Vascular: Major vessels at the base of the brain show flow. Skull and upper cervical spine: Negative Sinuses/Orbits: Clear/normal Other: None IMPRESSION: No acute or reversible finding. No  cause of the presenting symptoms is identified. Mild age related volume loss and chronic small-vessel change of the pons and hemispheric white matter. Electronically Signed   By: Nelson Chimes M.D.   On: 12/01/2017 14:10   Dg Chest Port 1 View  Result Date: 12/01/2017 CLINICAL DATA:  Check endotracheal tube EXAM: PORTABLE CHEST 1 VIEW COMPARISON:  11/30/2017 FINDINGS: Right-sided PICC line, endotracheal tube and nasogastric catheter are again seen and stable. Persistent changes in the left retrocardiac region are noted although some mild improved aeration is seen. No bony abnormality is noted. IMPRESSION: Improving left basilar infiltrate. Tubes and lines as described. Electronically Signed   By: Inez Catalina M.D.   On: 12/01/2017 11:47   Dg Fluoro Guided Loc Of Needle/cath Tip For Spinal Inject Lt  Result Date: 12/02/2017 CLINICAL DATA:  Suspected meningitis.  Encephalopathy. EXAM: DIAGNOSTIC LUMBAR PUNCTURE UNDER FLUOROSCOPIC GUIDANCE FLUOROSCOPY TIME:  Fluoroscopy Time:  18 seconds Radiation Exposure Index (if provided by the fluoroscopic device): 1.1 mGy Number of Acquired Spot Images: 0 PROCEDURE: Informed consent was obtained from the patient prior to the procedure, including potential complications of headache, allergy, and pain. With the patient prone, the lower back was prepped with Betadine. 1% Lidocaine was used for local anesthesia.  Lumbar puncture was performed at the L3-4 level using a 22 gauge needle with return of clear CSF. 9 ml of CSF were obtained for laboratory studies. The patient tolerated the procedure well and there were no apparent complications. IMPRESSION: Successful fluoroscopic guided lumbar puncture. Electronically Signed   By: Kathreen Devoid   On: 12/02/2017 10:07    Medications:  I have reviewed the patient's current medications. Scheduled: . chlorhexidine gluconate (MEDLINE KIT)  15 mL Mouth Rinse BID  . enoxaparin (LOVENOX) injection  40 mg Subcutaneous Q24H  . famotidine  20 mg Per Tube QHS  . feeding supplement (VITAL HIGH PROTEIN)  1,000 mL Per Tube Q24H  . insulin aspart  0-9 Units Subcutaneous Q4H  . insulin aspart  3 Units Subcutaneous Q4H  . ipratropium-albuterol  3 mL Nebulization Q6H  . mouth rinse  15 mL Mouth Rinse 10 times per day  . multivitamin  15 mL Per Tube Daily  . senna-docusate  2 tablet Per Tube BID  . sodium chloride flush  10-40 mL Intracatheter Q12H   Continuous: . sodium chloride Stopped (12/01/17 1300)  . acyclovir Stopped (12/02/17 1157)  . ampicillin (OMNIPEN) IV 2 g (12/02/17 1943)  . cefTRIAXone (ROCEPHIN)  IV Stopped (12/02/17 1131)  . norepinephrine (LEVOPHED) Adult infusion 4 mcg/min (12/02/17 1822)  . propofol (DIPRIVAN) infusion 40 mcg/kg/min (12/02/17 2005)  . vancomycin Stopped (12/02/17 1101)    Assessment/Plan: Patient remains intubated and sedated.  LP performed today.  Cell count with 5-127 rbcs and 0-3 white blood cells.  Glucose 108 and protein 49.  Culture pending but initial results do not suggest a bacterial meningitis.  HSV pending.    Recommendations: 1. Would continue Acyclovir until HSV results are available.   2. Agree with discontinuatino of other antibiotics.   3. EEG pending    LOS: 3 days   Alexis Goodell, MD Neurology [phone] 12/02/2017  8:15 PM

## 2017-12-02 NOTE — Progress Notes (Signed)
Patients sats dropped to 88% once again, repeated recruitment maneuver, lavaged and suctioned patient, and increased Fio2 to 50%. Sats rose into the mid 90's, will continue to monitor.

## 2017-12-02 NOTE — Progress Notes (Signed)
[place] [facility] [place] NAME: Marc [name]    MR#:  [id number]  DATE OF BIRTH:  08-21-1940  SUBJECTIVE:   Patient intubated and sedated on vent  O2 level was low FiO2 increased to 50% REVIEW OF SYSTEMS:    Intubated and sedated  Tolerating Diet: npo      DRUG ALLERGIES:   Allergies  Allergen Reactions  . Glimepiride   . Tape Itching and Rash    VITALS:  Blood pressure 117/72, pulse (!) 119, temperature 99.7 F (37.6 C), temperature source Oral, resp. rate 16, height 5\' 9"  (1.753 m), weight 129 lb 6.6 oz (58.7 kg), SpO2 93 %.  PHYSICAL EXAMINATION:  Constitutional: Appears critically ill-appearing and intubated HENT: Normocephalic. . Intubated Eyes: Conjunctivaeare normal.  Sluggish pupils   neck:. No JVD. No tracheal deviation. CVS: RRR, S1/S2 +, no murmurs, no gallops, no carotid bruit.  Pulmonary: Effort and breath sounds normal, no stridor, rhonchi, wheezes, rales.  Abdominal: Soft. BS +,  no distension, tenderness, rebound or guarding.  Musculoskeletal: N No edema and no tenderness.  Neuro: Sedated  skin: Skin is warm and dry. No rash noted. Psychiatric: Sedated    LABORATORY PANEL:   CBC Recent Labs  Lab 12/02/17 0808  WBC 8.0  HGB 12.8*  HCT 38.2*  PLT 103*   ------------------------------------------------------------------------------------------------------------------  Chemistries  Recent Labs  Lab 12/02/17 0808  NA 143  K 3.2*  CL 104  CO2 30  GLUCOSE 140*  BUN 29*  CREATININE 1.31*  CALCIUM 8.2*  MG 1.9  AST 29  ALT 33  ALKPHOS 44  BILITOT 0.7   ------------------------------------------------------------------------------------------------------------------  Cardiac Enzymes Recent Labs  Lab 11/29/17 1649  TROPONINI <0.03   ------------------------------------------------------------------------------------------------------------------  RADIOLOGY:  Dg Chest 1 View  Result  Date: 11/30/2017 CLINICAL DATA:  o2 decrease EXAM: CHEST  1 VIEW COMPARISON:  11/30/2017 FINDINGS: Endotracheal tube is in place with tip approximately 3.9 centimeters above the carina. Nasogastric tube is in place, tip coiled back upon itself into the gastric fundal region. There is volume loss at the LEFT lung base, obscuring the LEFT hemidiaphragm. RIGHT lung is clear. No pulmonary edema. IMPRESSION: Persistent significant atelectasis and/or consolidation of the LEFT LOWER lobe. Endotracheal tube and nasogastric tube in good position. No edema. Electronically Signed   By: Nolon Nations M.D.   On: 11/30/2017 18:50   Mr Brain Wo Contrast  Result Date: 12/01/2017 CLINICAL DATA:  Poor appetite. Dehydration. Weakness. Weight loss. EXAM: MRI HEAD WITHOUT CONTRAST TECHNIQUE: Multiplanar, multiecho pulse sequences of the brain and surrounding structures were obtained without intravenous contrast. COMPARISON:  CT 11/29/2017 FINDINGS: Brain: Mild age related volume loss. Mild small vessel change of the pons and cerebral hemispheric white matter. No evidence of acute or subacute infarction. No mass lesion, hemorrhage, hydrocephalus or extra-axial collection. Vascular: Major vessels at the base of the brain show flow. Skull and upper cervical spine: Negative Sinuses/Orbits: Clear/normal Other: None IMPRESSION: No acute or reversible finding. No cause of the presenting symptoms is identified. Mild age related volume loss and chronic small-vessel change of the pons and hemispheric white matter. Electronically Signed   By: Nelson Chimes M.D.   On: 12/01/2017 14:10   Dg Chest Port 1 View  Result Date: 12/01/2017 CLINICAL DATA:  Check endotracheal tube EXAM: PORTABLE CHEST 1 VIEW COMPARISON:  11/30/2017 FINDINGS: Right-sided PICC line, endotracheal tube and nasogastric catheter are again seen and stable. Persistent changes in the left retrocardiac region are noted although  some mild improved aeration is seen. No bony  abnormality is noted. IMPRESSION: Improving left basilar infiltrate. Tubes and lines as described. Electronically Signed   By: Inez Catalina M.D.   On: 12/01/2017 11:47   [place] Ekg Site Rite  Result Date: 11/30/2017 If Site Rite image not attached, placement could not be confirmed due to current cardiac rhythm.  Dg Fluoro Guided Loc Of Needle/cath Tip For Spinal Inject Lt  Result Date: 12/02/2017 CLINICAL DATA:  Suspected meningitis.  Encephalopathy. EXAM: DIAGNOSTIC LUMBAR PUNCTURE UNDER FLUOROSCOPIC GUIDANCE FLUOROSCOPY TIME:  Fluoroscopy Time:  18 seconds Radiation Exposure Index (if provided by the fluoroscopic device): 1.1 mGy Number of Acquired Spot Images: 0 PROCEDURE: Informed consent was obtained from the patient prior to the procedure, including potential complications of headache, allergy, and pain. With the patient prone, the lower back was prepped with Betadine. 1% Lidocaine was used for local anesthesia. Lumbar puncture was performed at the L3-4 level using a 22 gauge needle with return of clear CSF. 9 ml of CSF were obtained for laboratory studies. The patient tolerated the procedure well and there were no apparent complications. IMPRESSION: Successful fluoroscopic guided lumbar puncture. Electronically Signed   By: Kathreen Devoid   On: 12/02/2017 10:07     ASSESSMENT AND PLAN:   77 year old male with history of essential hypertension who presented to the emergency room initially due to generalized weakness and weight loss.   1.  Acute hypoxic respiratory failure due to inability to protect airway/responsiveness and patient was intubated by ED physician. Continue vent management as per intensivist    2.  Hypothermia: This is resolved Continue broad-spectrum antibiotics  He is status post LP  3.  Acute encephalopathy of unclear etiology:  CT head without acute pathology MRI brain essentially unremarkable  Follow-up on LP results  Follow-up on EEG  Continue broad-spectrum  antibiotics and acyclovir for possible meningitis.   Neurology consultation appreciated.    4.  Weight loss: Work-up after patient is more stable  5.  Diabetes with A1c of 7.4 Sliding scale  6.  H. pylori gastritis: Continue triple therapy once patient is able to take n.p.o. 7.  Hypotension after intubation: Weaning off of norepinephrine  8.  Hypokalemia: Replete    CODE STATUS: FULL  TOTAL TIME TAKING CARE OF THIS PATIENT: 23 minutes.   Patient remains critically ill.  Would benefit from palliative care consultation.   POSSIBLE D/C ??, DEPENDING ON CLINICAL CONDITION.   Berneice Zettlemoyer M.D on 12/02/2017 at 12:24 PM  Between 7am to 6pm - Pager - [phone] After 6pm go to [email or web address] - password [email or web address] [facility]  Office  [phone]  CC: Primary care physician; Tracie Harrier, MD  Note: This dictation was prepared with Dragon dictation along with smaller phrase technology. Any transcriptional errors that result from this process are unintentional.

## 2017-12-02 NOTE — Progress Notes (Signed)
RT assisted with patient transport to radiology for LP puncture. Patient was transported on trilogy ventilator, prone positioned once in radiology, and transported back to ICU with no complications.

## 2017-12-03 ENCOUNTER — Inpatient Hospital Stay: Payer: PPO

## 2017-12-03 ENCOUNTER — Encounter: Payer: PPO | Admitting: Internal Medicine

## 2017-12-03 DIAGNOSIS — E43 Unspecified severe protein-calorie malnutrition: Secondary | ICD-10-CM

## 2017-12-03 LAB — GLUCOSE, CAPILLARY
GLUCOSE-CAPILLARY: 201 mg/dL — AB (ref 70–99)
Glucose-Capillary: 126 mg/dL — ABNORMAL HIGH (ref 70–99)
Glucose-Capillary: 135 mg/dL — ABNORMAL HIGH (ref 70–99)
Glucose-Capillary: 165 mg/dL — ABNORMAL HIGH (ref 70–99)
Glucose-Capillary: 184 mg/dL — ABNORMAL HIGH (ref 70–99)
Glucose-Capillary: 185 mg/dL — ABNORMAL HIGH (ref 70–99)

## 2017-12-03 LAB — LEGIONELLA PNEUMOPHILA SEROGP 1 UR AG: L. pneumophila Serogp 1 Ur Ag: NEGATIVE

## 2017-12-03 LAB — BASIC METABOLIC PANEL
ANION GAP: 5 (ref 5–15)
BUN: 29 mg/dL — AB (ref 8–23)
CALCIUM: 8.4 mg/dL — AB (ref 8.9–10.3)
CO2: 31 mmol/L (ref 22–32)
Chloride: 108 mmol/L (ref 98–111)
Creatinine, Ser: 1.22 mg/dL (ref 0.61–1.24)
GFR calc Af Amer: >60 mL/min (ref >60)
GFR, EST NON AFRICAN AMERICAN: 56 mL/min — AB (ref >60)
GLUCOSE: 215 mg/dL — AB (ref 70–99)
POTASSIUM: 3.9 mmol/L (ref 3.5–5.1)
Sodium: 144 mmol/L (ref 135–145)

## 2017-12-03 LAB — HIV ANTIBODY (ROUTINE TESTING W REFLEX): HIV Screen 4th Generation wRfx: NONREACTIVE

## 2017-12-03 LAB — PHOSPHORUS: Phosphorus: 3.6 mg/dL (ref 2.5–4.6)

## 2017-12-03 LAB — VANCOMYCIN, TROUGH: VANCOMYCIN TR: 13 ug/mL — AB (ref 15–20)

## 2017-12-03 LAB — APTT: APTT: 38 s — AB (ref 24–36)

## 2017-12-03 LAB — PROTIME-INR
INR: 1.27
Prothrombin Time: 15.8 seconds — ABNORMAL HIGH (ref 11.4–15.2)

## 2017-12-03 LAB — TRIGLYCERIDES: TRIGLYCERIDES: 108 mg/dL (ref <150)

## 2017-12-03 LAB — CYTOLOGY - NON PAP

## 2017-12-03 LAB — MAGNESIUM: Magnesium: 2.3 mg/dL (ref 1.7–2.4)

## 2017-12-03 MED ORDER — HEPARIN (PORCINE) IN NACL 100-0.45 UNIT/ML-% IJ SOLN
1100.0000 [IU]/h | INTRAMUSCULAR | Status: AC
  Administered 2017-12-03 – 2017-12-04 (×2): 1000 [IU]/h via INTRAVENOUS
  Administered 2017-12-05 – 2017-12-11 (×7): 1100 [IU]/h via INTRAVENOUS
  Filled 2017-12-03 (×9): qty 250

## 2017-12-03 MED ORDER — POLYETHYLENE GLYCOL 3350 17 G PO PACK
17.0000 g | PACK | Freq: Every day | ORAL | Status: DC
  Filled 2017-12-03: qty 1

## 2017-12-03 MED ORDER — DEXTROSE 5 % IV SOLN
600.0000 mg | Freq: Three times a day (TID) | INTRAVENOUS | Status: DC
  Administered 2017-12-03 – 2017-12-05 (×6): 600 mg via INTRAVENOUS
  Filled 2017-12-03 (×8): qty 12

## 2017-12-03 MED ORDER — AMOXICILLIN 250 MG/5ML PO SUSR
1000.0000 mg | Freq: Two times a day (BID) | ORAL | Status: DC
  Administered 2017-12-03 – 2017-12-04 (×3): 1000 mg
  Filled 2017-12-03 (×10): qty 20

## 2017-12-03 MED ORDER — CLARITHROMYCIN 500 MG PO TABS
500.0000 mg | ORAL_TABLET | Freq: Two times a day (BID) | ORAL | Status: DC
  Administered 2017-12-03 – 2017-12-04 (×3): 500 mg
  Filled 2017-12-03 (×5): qty 1

## 2017-12-03 MED ORDER — INSULIN ASPART 100 UNIT/ML ~~LOC~~ SOLN
5.0000 [IU] | SUBCUTANEOUS | Status: DC
  Administered 2017-12-03 – 2017-12-07 (×13): 5 [IU] via SUBCUTANEOUS
  Filled 2017-12-03 (×9): qty 1

## 2017-12-03 MED ORDER — HEPARIN BOLUS VIA INFUSION
1500.0000 [IU] | Freq: Once | INTRAVENOUS | Status: AC
  Administered 2017-12-03: 1500 [IU] via INTRAVENOUS
  Filled 2017-12-03: qty 1500

## 2017-12-03 MED ORDER — PANTOPRAZOLE SODIUM 40 MG PO PACK
40.0000 mg | PACK | Freq: Two times a day (BID) | ORAL | Status: DC
  Administered 2017-12-03 – 2017-12-04 (×3): 40 mg
  Filled 2017-12-03 (×2): qty 20

## 2017-12-03 MED ORDER — VITAL HIGH PROTEIN PO LIQD
1000.0000 mL | ORAL | Status: DC
  Administered 2017-12-03 – 2017-12-04 (×2): 1000 mL

## 2017-12-03 NOTE — Progress Notes (Signed)
[facility] for heparin drip management  Indication: DVT  Allergies  Allergen Reactions  . Glimepiride   . Tape Itching and Rash    Patient Measurements: Height: _0  (175.3 cm) Weight: 134 lb 7.7 oz (61 kg) IBW/kg (Calculated) : 70.7   Vital Signs: Temp: 97.8 F (36.6 C) (08/06 1430) Temp Source: Oral (08/06 0400) BP: 84/59 (08/06 1530) Pulse Rate: 100 (08/06 1530)  Labs: Recent Labs    12/01/17 0520 12/01/17 1728 12/02/17 0808 12/03/17 0403  HGB 12.3*  --  12.8*  --   HCT 35.0*  --  38.2*  --   PLT 118*  --  103*  --   CREATININE 1.40* 1.37* 1.31* 1.22    Estimated Creatinine Clearance: 45.1 mL/min (by C-G formula based on SCr of 1.22 mg/dL).   Medical History: Past Medical History:  Diagnosis Date  . Anxiety   . BPH (benign prostatic hyperplasia)   . Colon adenomas    history of  . Diverticulosis   . Hyperlipidemia   . Hypertension     Medications:  Scheduled:  . chlorhexidine gluconate (MEDLINE KIT)  15 mL Mouth Rinse BID  . famotidine  20 mg Per Tube QHS  . heparin  1,500 Units Intravenous Once  . insulin aspart  0-9 Units Subcutaneous Q4H  . insulin aspart  5 Units Subcutaneous Q4H  . ipratropium-albuterol  3 mL Nebulization Q6H  . mouth rinse  15 mL Mouth Rinse 10 times per day  . multivitamin  15 mL Per Tube Daily  . polyethylene glycol  17 g Oral Daily  . senna-docusate  2 tablet Per Tube BID  . sodium chloride flush  10-40 mL Intracatheter Q12H   Infusions:  . sodium chloride Stopped (12/01/17 1300)  . acyclovir Stopped (12/03/17 1524)  . feeding supplement (VITAL HIGH PROTEIN)    . heparin    . norepinephrine (LEVOPHED) Adult infusion Stopped (12/03/17 0945)  . propofol (DIPRIVAN) infusion 25 mcg/kg/min (12/03/17 1050)    Assessment: Pharmacy consulted for heparin drip management for 77 yo male admitted with AMS and being treated for DVT of right upper extremety. Patient remains sedated and  requiring mechanical ventilation. Patient last received enoxaparin 24m SQ at 0716 on 8/6.   Goal of Therapy:  Heparin level 0.3-0.7 units/ml Monitor platelets by anticoagulation protocol: Yes   Plan:  Will give heparin bolus of 1500 units (half typical bolus) due to recent enoxaparin administration. Will continue heparin at 1000 units/hr. Will obtain heparin level at 0000 on 8/7.   Pharmacy will continue to monitor and adjust per consult.   Simpson,Michael L 12/03/2017,3:59 PM

## 2017-12-03 NOTE — Progress Notes (Signed)
Patient tachy, hypertensive, labored breathing, agitated during weaning trial. Patient intermittently following commands. Per Dr Mortimer Fries re-sedated patient.

## 2017-12-03 NOTE — Progress Notes (Signed)
Inpatient Diabetes Program Recommendations  AACE/ADA: New Consensus Statement on Inpatient Glycemic Control (2019)  Target Ranges:  Prepandial:   less than 140 mg/dL      Peak postprandial:   less than 180 mg/dL (1-2 hours)      Critically ill patients:  140 - 180 mg/dL   Results for [name], [name] (MRN [id number]) as of 12/03/2017 09:34  Ref. Range 12/02/2017 04:06 12/02/2017 07:53 12/02/2017 11:22 12/02/2017 16:20 12/02/2017 19:31 12/02/2017 23:17 12/03/2017 03:52 12/03/2017 07:35  Glucose-Capillary Latest Ref Range: 70 - 99 mg/dL 183 (H) 124 (H) 152 (H) 202 (H) 191 (H) 155 (H) 185 (H) 201 (H)   Review of Glycemic Control  Diabetes history: DM2 (diet controlled) Outpatient Diabetes medications: None Current orders for Inpatient glycemic control: Novolog 0-9 units Q4H, Novolog 3 units Q4H for tube feeding coverage  Inpatient Diabetes Program Recommendations:  Insulin-Tube Feeding Coverage: Please consider increasing tube feeding coverage to Novolog 5 units Q4H.  Thanks, Barnie Alderman, RN, MSN, CDE Diabetes Coordinator Inpatient Diabetes Program [phone] (Team Pager from 8am to 5pm)

## 2017-12-03 NOTE — Progress Notes (Signed)
Pharmacy Electrolyte/Glucose/Constipation Monitoring Consult:  Pharmacy consulted to assist in monitoring and replacing electrolytes in this 77 y.o. male admitted on 11/29/2017 with Weakness and Dizziness  Patient is currently sedated on propofol.   Labs:  Sodium (mmol/L)  Date Value  12/03/2017 144   Potassium (mmol/L)  Date Value  12/03/2017 3.9   Magnesium (mg/dL)  Date Value  12/03/2017 2.3   Phosphorus (mg/dL)  Date Value  12/03/2017 3.6   Calcium (mg/dL)  Date Value  12/03/2017 8.4 (L)   Albumin (g/dL)  Date Value  12/02/2017 2.6 (L)    Assessment/Plan: 1. Electrolytes: no replacement warranted at this time. Will recheck electrolytes with am labs.   2. Glucose management: continue SSI. Will advance tube feeding coverage to Novolog 5units Q4hr.   3. Constipation management: continue senna/docusate 2 tabs VT BID and Miralax daily.   Pharmacy will continue to monitor and adjust per consult.   Milo Schreier L 12/03/2017 3:38 PM

## 2017-12-03 NOTE — Progress Notes (Signed)
CHIEF COMPLAINT:   Chief Complaint  Patient presents with  . Weakness  . Dizziness  intubated for resp failure  Subjective  remains critically ill S/p LP findings not c/w bacterial meningitis Severe resp failure unable to protect airway Follow up Neurology recs    REVIEW OF SYSTEMS  PATIENT IS UNABLE TO PROVIDE COMPLETE REVIEW OF SYSTEM S DUE TO SEVERE CRITICAL ILLNESS          Objective   Examination: PHYSICAL EXAMINATION:  GENERAL:critically ill appearing, +resp distress HEAD: Normocephalic, atraumatic.  EYES: Pupils equal, round, reactive to light.  No scleral icterus.  MOUTH: Moist mucosal membrane. NECK: Supple. No thyromegaly. No nodules. No JVD.  PULMONARY: +rhonchi, +wheezing CARDIOVASCULAR: S1 and S2. Regular rate and rhythm. No murmurs, rubs, or gallops.  GASTROINTESTINAL: Soft, nontender, -distended. No masses. Positive bowel sounds. No hepatosplenomegaly.  MUSCULOSKELETAL: No swelling, clubbing, or edema.  NEUROLOGIC: obtunded SKIN:intact,warm,dry    VITALS:  height is 5' 9"  (1.753 m) and weight is 134 lb 7.7 oz (61 kg). His oral temperature is 98.6 F (37 C). His blood pressure is 147/88 (abnormal) and his pulse is 103 (abnormal). His respiration is 24 (abnormal) and oxygen saturation is 100%.   I personally reviewed Labs under Results section.  Radiology Reports Mr Brain Wo Contrast  Result Date: 12/01/2017 CLINICAL DATA:  Poor appetite. Dehydration. Weakness. Weight loss. EXAM: MRI HEAD WITHOUT CONTRAST TECHNIQUE: Multiplanar, multiecho pulse sequences of the brain and surrounding structures were obtained without intravenous contrast. COMPARISON:  CT 11/29/2017 FINDINGS: Brain: Mild age related volume loss. Mild small vessel change of the pons and cerebral hemispheric white matter. No evidence of acute or subacute infarction. No mass lesion, hemorrhage, hydrocephalus or extra-axial collection. Vascular: Major vessels at the base of the brain show  flow. Skull and upper cervical spine: Negative Sinuses/Orbits: Clear/normal Other: None IMPRESSION: No acute or reversible finding. No cause of the presenting symptoms is identified. Mild age related volume loss and chronic small-vessel change of the pons and hemispheric white matter. Electronically Signed   By: Nelson Chimes M.D.   On: 12/01/2017 14:10   Dg Chest Port 1 View  Result Date: 12/01/2017 CLINICAL DATA:  Check endotracheal tube EXAM: PORTABLE CHEST 1 VIEW COMPARISON:  11/30/2017 FINDINGS: Right-sided PICC line, endotracheal tube and nasogastric catheter are again seen and stable. Persistent changes in the left retrocardiac region are noted although some mild improved aeration is seen. No bony abnormality is noted. IMPRESSION: Improving left basilar infiltrate. Tubes and lines as described. Electronically Signed   By: Inez Catalina M.D.   On: 12/01/2017 11:47   Dg Fluoro Guided Loc Of Needle/cath Tip For Spinal Inject Lt  Result Date: 12/02/2017 CLINICAL DATA:  Suspected meningitis.  Encephalopathy. EXAM: DIAGNOSTIC LUMBAR PUNCTURE UNDER FLUOROSCOPIC GUIDANCE FLUOROSCOPY TIME:  Fluoroscopy Time:  18 seconds Radiation Exposure Index (if provided by the fluoroscopic device): 1.1 mGy Number of Acquired Spot Images: 0 PROCEDURE: Informed consent was obtained from the patient prior to the procedure, including potential complications of headache, allergy, and pain. With the patient prone, the lower back was prepped with Betadine. 1% Lidocaine was used for local anesthesia. Lumbar puncture was performed at the L3-4 level using a 22 gauge needle with return of clear CSF. 9 ml of CSF were obtained for laboratory studies. The patient tolerated the procedure well and there were no apparent complications. IMPRESSION: Successful fluoroscopic guided lumbar puncture. Electronically Signed   By: Kathreen Devoid   On: 12/02/2017 10:07  Assessment/Plan:  77 yo male with acute encephalopathy ?etiology leading to  inability to protect airway leading to severe resp failure  Severe Hypoxic and Hypercapnic Respiratory Failure -continue Full MV support -continue Bronchodilator Therapy -Wean Fio2 and PEEP as tolerated -will perform SAT/SBTTwhen respiratory parameters are met    NEUROLOGY - intubated and sedated - minimal sedation to achieve a RASS goal: -1 Follow up neuro recs  DVT/GI prx TF's at goal    Critical Care Time devoted to patient care services described in this note is 32 minutes.   Overall, patient is critically ill, prognosis is guarded.    Corrin Parker, M.D.  [facility]  Medical Director [place] Director [facility] Cardio-Pulmonary Department

## 2017-12-03 NOTE — Progress Notes (Signed)
Pharmacy Antibiotic Note  Marc [name] is a 77 y.o. male admitted on 11/29/2017 with meningitis (presumed).  Pharmacy has been consulted for vancomycin dosing. Patient currently on acyclovir 565 mg IV Q12hr. ampicillin 2g IV Q4hr, ceftriaxone 2g IV Q12hr, and vancomycin 1g IV Q24hr.   Plan: Antibiotics stopped on 8/6.   Patient's renal function improving, per conversation with CCM will resume q8hr dosing of acyclovir.   Height: 5\' 9"  (175.3 cm) Weight: 134 lb 7.7 oz (61 kg) IBW/kg (Calculated) : 70.7  Temp (24hrs), Avg:98.7 F (37.1 C), Min:98.4 F (36.9 C), Max:99.2 F (37.3 C)  Recent Labs  Lab 11/29/17 1649 11/29/17 2332 11/30/17 0323 12/01/17 0520 12/01/17 1728 12/02/17 0808 12/03/17 0403 12/03/17 0717  WBC 11.5*  --   --  13.1*  --  8.0  --   --   CREATININE 0.91  --  0.90 1.40* 1.37* 1.31* 1.22  --   LATICACIDVEN  --  0.8 1.0  --   --   --   --   --   VANCOTROUGH  --   --   --   --   --   --   --  13*    Estimated Creatinine Clearance: 45.1 mL/min (by C-G formula based on SCr of 1.22 mg/dL).    Allergies  Allergen Reactions  . Glimepiride   . Tape Itching and Rash    Antimicrobials this admission: Zosyn 8/2 >> 8/3 Vancomycin 8/2 >> 8/6 Acyclovir 8/3 >>  Ampicillin 8/3 >> 8/6  Ceftriaxone 8/3 >> 8/6   Dose adjustments this admission: 8/4 Ampicillin renally adjusted from Q4hr to Q6hr.  8/4 Vancomycin empirically adjusted to Q24hr.  8/4 Acyclovir renally adjusted from Q8hr to Q12hr.  8/6 Acyclovir Q8hr dosing resumed.   Microbiology results: 8/2 BCx: no growth x 4 days  8/2 UCx: < 10K colonies  8/3 MRSA PCR: negative  8/5 CSF culture: pending  8/5 BCx: no growth < 24 hours   Thank you for allowing pharmacy to be a part of this patient's care.  [name] 12/03/2017 9:19 AM

## 2017-12-03 NOTE — Progress Notes (Signed)
[place] at [place] NAME: Marc [name]    MR#:  [id number]  DATE OF BIRTH:  [birth year]  SUBJECTIVE:   Patient's wife is at bedside. Patient was awake this morning however he is back on sedation due to increased respiratory effort.  REVIEW OF SYSTEMS:    Intubated and sedated  Tolerating Diet: npo      DRUG ALLERGIES:   Allergies  Allergen Reactions  . Glimepiride   . Tape Itching and Rash    VITALS:  Blood pressure (!) 160/100, pulse (!) 116, temperature 97.6 F (36.4 C), resp. rate (!) 24, height 5\' 9"  (1.753 m), weight 134 lb 7.7 oz (61 kg), SpO2 96 %.  PHYSICAL EXAMINATION:  Constitutional: Appears critically ill-appearing and intubated HENT: Normocephalic. . Intubated Eyes: Conjunctivaeare normal.  Sluggish pupils   neck:. No JVD. No tracheal deviation. CVS: RRR, S1/S2 +, no murmurs, no gallops, no carotid bruit.  Pulmonary: Effort and breath sounds normal, no stridor, rhonchi, wheezes, rales.  Abdominal: Soft. BS +,  no distension, tenderness, rebound or guarding.  Musculoskeletal: N No edema and no tenderness.  Neuro: Sedated  skin: Skin is warm and dry. No rash noted. Psychiatric: Sedated    LABORATORY PANEL:   CBC Recent Labs  Lab 12/02/17 0808  WBC 8.0  HGB 12.8*  HCT 38.2*  PLT 103*   ------------------------------------------------------------------------------------------------------------------  Chemistries  Recent Labs  Lab 12/02/17 0808 12/03/17 0403  NA 143 144  K 3.2* 3.9  CL 104 108  CO2 30 31  GLUCOSE 140* 215*  BUN 29* 29*  CREATININE 1.31* 1.22  CALCIUM 8.2* 8.4*  MG 1.9 2.3  AST 29  --   ALT 33  --   ALKPHOS 44  --   BILITOT 0.7  --    ------------------------------------------------------------------------------------------------------------------  Cardiac Enzymes Recent Labs  Lab 11/29/17 1649  TROPONINI <0.03    ------------------------------------------------------------------------------------------------------------------  RADIOLOGY:  Mr Brain Wo Contrast  Result Date: 12/01/2017 CLINICAL DATA:  Poor appetite. Dehydration. Weakness. Weight loss. EXAM: MRI HEAD WITHOUT CONTRAST TECHNIQUE: Multiplanar, multiecho pulse sequences of the brain and surrounding structures were obtained without intravenous contrast. COMPARISON:  CT 11/29/2017 FINDINGS: Brain: Mild age related volume loss. Mild small vessel change of the pons and cerebral hemispheric white matter. No evidence of acute or subacute infarction. No mass lesion, hemorrhage, hydrocephalus or extra-axial collection. Vascular: Major vessels at the base of the brain show flow. Skull and upper cervical spine: Negative Sinuses/Orbits: Clear/normal Other: None IMPRESSION: No acute or reversible finding. No cause of the presenting symptoms is identified. Mild age related volume loss and chronic small-vessel change of the pons and hemispheric white matter. Electronically Signed   By: Nelson Chimes M.D.   On: 12/01/2017 14:10   Dg Fluoro Guided Loc Of Needle/cath Tip For Spinal Inject Lt  Result Date: 12/02/2017 CLINICAL DATA:  Suspected meningitis.  Encephalopathy. EXAM: DIAGNOSTIC LUMBAR PUNCTURE UNDER FLUOROSCOPIC GUIDANCE FLUOROSCOPY TIME:  Fluoroscopy Time:  18 seconds Radiation Exposure Index (if provided by the fluoroscopic device): 1.1 mGy Number of Acquired Spot Images: 0 PROCEDURE: Informed consent was obtained from the patient prior to the procedure, including potential complications of headache, allergy, and pain. With the patient prone, the lower back was prepped with Betadine. 1% Lidocaine was used for local anesthesia. Lumbar puncture was performed at the L3-4 level using a 22 gauge needle with return of clear CSF. 9 ml of CSF were obtained for laboratory studies. The patient tolerated the procedure well  and there were no apparent complications.  IMPRESSION: Successful fluoroscopic guided lumbar puncture. Electronically Signed   By: Kathreen Devoid   On: 12/02/2017 10:07     ASSESSMENT AND PLAN:   77 year old male with history of essential hypertension who presented to the emergency room initially due to generalized weakness and weight loss.   1.  Acute hypoxic respiratory failure due to inability to protect airway/responsiveness and patient was intubated by ED physician. Continue vent management as per intensivist    2.  Hypothermia: This has resolved Continue broad-spectrum antibiotics  He is status post LP which does not appear to be bacterial meningitis  3.  Acute encephalopathy of unclear etiology:  CT head without acute pathology MRI brain essentially unremarkable  Follow-up on HSV EEG characterized by slowing which is consistent with normal drowsy state.  No epileptic activity is noted.  Continue broad-spectrum antibiotics and acyclovir for now..   Neurology consultation appreciated.    4.  Weight loss: Work-up after patient is more stable  5.  Diabetes with A1c of 7.4 Sliding scale  6.  H. pylori gastritis: Continue triple therapy once patient is able to take n.p.o. 7.  Hypotension after intubation: Weaning off of norepinephrine  8.  Hypokalemia: Replete as needed.    CODE STATUS: FULL  TOTAL TIME TAKING CARE OF THIS PATIENT: 23 minutes.   Patient remains critically ill.  Would benefit from palliative care consultation.   POSSIBLE D/C ??, DEPENDING ON CLINICAL CONDITION.   Marc [name] M.D on 12/03/2017 at 11:06 AM  Between 7am to 6pm - Pager - [phone] After 6pm go to [email or web address] - password [email or web address] [facility]  Office  [phone]  CC: Primary care physician; Tracie Harrier, MD  Note: This dictation was prepared with Dragon dictation along with smaller phrase technology. Any transcriptional errors that result from this process are unintentional.

## 2017-12-03 NOTE — Progress Notes (Addendum)
Subjective: Decreasing sedation  Objective: Current vital signs: BP 107/68   Pulse (!) 106   Temp (!) 97.5 F (36.4 C)   Resp (!) 22   Ht 5' 9"  (1.753 m)   Wt 61 kg (134 lb 7.7 oz)   SpO2 98%   BMI 19.86 kg/m  Vital signs in last 24 hours: Temp:  [97.5 F (36.4 C)-99.2 F (37.3 C)] 97.5 F (36.4 C) (08/06 1600) Pulse Rate:  [88-117] 106 (08/06 1730) Resp:  [2-32] 22 (08/06 1730) BP: (84-184)/(58-115) 107/68 (08/06 1730) SpO2:  [88 %-100 %] 98 % (08/06 1730) FiO2 (%):  [35 %-50 %] 35 % (08/06 1552) Weight:  [61 kg (134 lb 7.7 oz)] 61 kg (134 lb 7.7 oz) (08/06 0500)  Intake/Output from previous day: 08/05 0701 - 08/06 0700 In: 2911.5 [I.V.:743.6; NG/GT:787.8; IV Piggyback:1380] Out: 7829 [Urine:1715] Intake/Output this shift: No intake/output data recorded. Nutritional status:  Diet Order           Diet NPO time specified  Diet effective now          Neurologic Exam: Mental Status: Eyes open.  Follows simple commands Cranial Nerves: II: patient does not respond confrontation bilaterally, pupils right 3 mm, left 2 mm,and reactive bilaterally III,IV,VI: doll's response present bilaterally.  V,VII: corneal reflex present bilaterally  VIII: grossly intact IX,X: gag reflex reduced, XI: trapezius strength unable to test bilaterally XII: tongue strength unable to test Motor: Moves all extremities against gravity  Lab Results: Basic Metabolic Panel: Recent Labs  Lab 11/30/17 0323 12/01/17 0520 12/01/17 1728 12/02/17 0808 12/03/17 0403  NA 146* 137 144 143 144  K 4.9 2.6* 3.5 3.2* 3.9  CL 101 99 107 104 108  CO2 37* 28 32 30 31  GLUCOSE 257* 166* 197* 140* 215*  BUN 22 34* 35* 29* 29*  CREATININE 0.90 1.40* 1.37* 1.31* 1.22  CALCIUM 9.2 7.5* 8.6* 8.2* 8.4*  MG 2.0 1.6* 2.2 1.9 2.3  PHOS 6.5* 2.0* 2.8 3.7 3.6    Liver Function Tests: Recent Labs  Lab 11/29/17 1649 12/02/17 0808  AST 22 29  ALT 30 33  ALKPHOS 71 44  BILITOT 0.7 0.7  PROT 7.0 4.9*   ALBUMIN 4.5 2.6*   No results for input(s): LIPASE, AMYLASE in the last 168 hours. Recent Labs  Lab 11/30/17 0323  AMMONIA 39*    CBC: Recent Labs  Lab 11/29/17 1649 12/01/17 0520 12/02/17 0808  WBC 11.5* 13.1* 8.0  NEUTROABS  --  9.8* 6.5  HGB 15.7 12.3* 12.8*  HCT 46.1 35.0* 38.2*  MCV 95.9 96.9 95.7  PLT 131* 118* 103*    Cardiac Enzymes: Recent Labs  Lab 11/29/17 1649  TROPONINI <0.03    Lipid Panel: Recent Labs  Lab 11/30/17 1902 12/03/17 1704  TRIG 141 108    CBG: Recent Labs  Lab 12/02/17 2317 12/03/17 0352 12/03/17 0735 12/03/17 1104 12/03/17 1624  GLUCAP 155* 185* 201* 135* 184*    Microbiology: Results for orders placed or performed during the hospital encounter of 11/29/17  Urine culture     Status: Abnormal   Collection Time: 11/29/17 11:32 PM  Result Value Ref Range Status   Specimen Description   Final    URINE, RANDOM Performed at [facility], [address] [place], [place] [address]    Special Requests   Final    NONE Performed at [facility], [address] [place], [place] [address]    Culture (A)  Final    <  10,000 COLONIES/mL INSIGNIFICANT GROWTH Performed at [facility], [place] [address] [place], [place] [address]    Report Status 12/01/2017 FINAL  Final  Blood Culture (routine x 2)     Status: None (Preliminary result)   Collection Time: 11/29/17 11:32 PM  Result Value Ref Range Status   Specimen Description BLOOD LEFT ANTECUBITAL  Final   Special Requests   Final    BOTTLES DRAWN AEROBIC AND ANAEROBIC Blood Culture adequate volume   Culture   Final    NO GROWTH 4 DAYS Performed at [facility], [address], [place], [place] [address]    Report Status PENDING  Incomplete  Blood Culture (routine x 2)     Status: None (Preliminary result)   Collection Time: 11/29/17 11:32 PM  Result Value Ref Range Status   Specimen Description BLOOD RIGHT ANTECUBITAL  Final   Special  Requests   Final    BOTTLES DRAWN AEROBIC AND ANAEROBIC Blood Culture adequate volume   Culture   Final    NO GROWTH 4 DAYS Performed at [facility], [address] [place], [place] [address]    Report Status PENDING  Incomplete  MRSA PCR Screening     Status: None   Collection Time: 11/30/17 11:59 AM  Result Value Ref Range Status   MRSA by PCR NEGATIVE NEGATIVE Final    Comment:        The GeneXpert MRSA Assay (FDA approved for NASAL specimens only), is one component of a comprehensive MRSA colonization surveillance program. It is not intended to diagnose MRSA infection nor to guide or monitor treatment for MRSA infections. Performed at [facility], [place] [place], [place] [address]   Culture, blood (routine x 2)     Status: None (Preliminary result)   Collection Time: 12/02/17  8:08 AM  Result Value Ref Range Status   Specimen Description BLOOD BLOOD RIGHT HAND  Final   Special Requests   Final    BOTTLES DRAWN AEROBIC AND ANAEROBIC Blood Culture adequate volume   Culture   Final    NO GROWTH < 24 HOURS Performed at [facility], [address] [place], [place] [address]    Report Status PENDING  Incomplete  Culture, blood (routine x 2)     Status: None (Preliminary result)   Collection Time: 12/02/17  8:08 AM  Result Value Ref Range Status   Specimen Description BLOOD BLOOD RIGHT WRIST  Final   Special Requests   Final    BOTTLES DRAWN AEROBIC AND ANAEROBIC Blood Culture results may not be optimal due to an inadequate volume of blood received in culture bottles   Culture   Final    NO GROWTH < 24 HOURS Performed at [facility], [address] [place], [place] [address]    Report Status PENDING  Incomplete  CSF culture     Status: None (Preliminary result)   Collection Time: 12/02/17  9:40 AM  Result Value Ref Range Status   Specimen Description   Final    CSF Performed at [facility], [place] [address] [place], [place] [address]    Special Requests   Final    NONE Performed at [facility], [place], [place] [address]    Gram Stain   Final    RARE WBC SEEN MODERATE RED BLOOD CELLS NO ORGANISMS SEEN    Culture   Final    NO GROWTH < 24  HOURS Performed at [facility], [place] [address], [place], [place] [address]    Report Status PENDING  Incomplete  Culture, fungus without smear     Status: None (Preliminary result)   Collection Time: 12/02/17  9:40 AM  Result Value Ref Range Status   Specimen Description   Final    CSF Performed at [facility], [address], [place], [place] [address]    Special Requests   Final    NONE Performed at [facility], [address] [place], [place] [address]    Culture   Final    NO FUNGUS ISOLATED AFTER 1 DAY Performed at [facility], [place] [address] [place],  [address]    Report Status PENDING  Incomplete    Coagulation Studies: Recent Labs    12/03/17 1611  LABPROT 15.8*  INR 1.27    Imaging: US Venous Img Upper Uni Right  Result Date: 12/03/2017 CLINICAL DATA:  Right upper extremity pain and edema. Evaluate for DVT. EXAM: RIGHT UPPER EXTREMITY VENOUS DOPPLER ULTRASOUND TECHNIQUE: Gray-scale sonography with graded compression, as well as color Doppler and duplex ultrasound were performed to evaluate the upper extremity deep venous system from the level of the subclavian vein and including the jugular, axillary, basilic, radial, ulnar and upper cephalic vein. Spectral Doppler was utilized to evaluate flow at rest and with distal augmentation maneuvers. COMPARISON:  Chest radiograph-12/02/2018 FINDINGS: Contralateral Subclavian Vein: Respiratory phasicity is normal and symmetric with the symptomatic side. No evidence of thrombus. Normal compressibility. Internal Jugular Vein: No evidence of thrombus. Normal compressibility, respiratory phasicity and response to augmentation.  Subclavian Vein: There is nonocclusive DVT surrounding the right upper extremity approach PICC line at the level of the peripheral aspect of the right subclavian vein (images 10 and 11). Axillary Vein: No evidence of thrombus. Normal compressibility, respiratory phasicity and response to augmentation. Cephalic Vein: There is mixed echogenic occlusive thrombus involving the distal aspect of the cephalic vein (image 18 through 21). The proximal aspect of the cephalic vein appears widely patent (image 16). Basilic Vein: No evidence of thrombus. Normal compressibility, respiratory phasicity and response to augmentation. Brachial Veins: No evidence of thrombus. Normal compressibility, respiratory phasicity and response to augmentation. Radial Veins: No evidence of thrombus though rouleaux flow was demonstrated. Normal compressibility, respiratory phasicity and response to augmentation. Ulnar Veins: No evidence of thrombus. Normal compressibility, respiratory phasicity and response to augmentation. Venous Reflux:  None visualized. Other Findings:  None visualized. IMPRESSION: 1. The examination is positive for nonocclusive DVT surrounding the right upper extremity approach PICC line at level peripheral aspect of the right subclavian vein. 2. Examination is positive for occlusive SVT involving the cephalic vein at the level the mid and distal humerus. Electronically Signed   By: Sandi Mariscal M.D.   On: 12/03/2017 15:03   Dg Fluoro Guided Loc Of Needle/cath Tip For Spinal Inject Lt  Result Date: 12/02/2017 CLINICAL DATA:  Suspected meningitis.  Encephalopathy. EXAM: DIAGNOSTIC LUMBAR PUNCTURE UNDER FLUOROSCOPIC GUIDANCE FLUOROSCOPY TIME:  Fluoroscopy Time:  18 seconds Radiation Exposure Index (if provided by the fluoroscopic device): 1.1 mGy Number of Acquired Spot Images: 0 PROCEDURE: Informed consent was obtained from the patient prior to the procedure, including potential complications of headache, allergy, and pain.  With the patient prone, the lower back was prepped with Betadine. 1% Lidocaine was used for local anesthesia. Lumbar puncture was performed at the L3-4 level using a 22 gauge needle with return of clear CSF.  9 ml of CSF were obtained for laboratory studies. The patient tolerated the procedure well and there were no apparent complications. IMPRESSION: Successful fluoroscopic guided lumbar puncture. Electronically Signed   By: Kathreen Devoid   On: 12/02/2017 10:07    Medications:  I have reviewed the patient's current medications. Scheduled: . amoxicillin  1,000 mg Per Tube Q12H  . chlorhexidine gluconate (MEDLINE KIT)  15 mL Mouth Rinse BID  . clarithromycin  500 mg Per Tube Q12H  . insulin aspart  0-9 Units Subcutaneous Q4H  . insulin aspart  5 Units Subcutaneous Q4H  . ipratropium-albuterol  3 mL Nebulization Q6H  . mouth rinse  15 mL Mouth Rinse 10 times per day  . multivitamin  15 mL Per Tube Daily  . pantoprazole sodium  40 mg Per Tube BID  . polyethylene glycol  17 g Oral Daily  . senna-docusate  2 tablet Per Tube BID  . sodium chloride flush  10-40 mL Intracatheter Q12H    Assessment/Plan: Sedation decreased.  Patient now following commands.  HSV remains pending.  EEG slow but with no epileptiform activity.  Will continue to follow with you.  Continue Acyclovir.   LOS: 4 days   Alexis Goodell, MD Neurology [phone] 12/03/2017  7:23 PM

## 2017-12-03 NOTE — Progress Notes (Addendum)
Family Meeting Note  Advance Directive:yes  Today a meeting took place with the spouse.  Patient is unable to participate due LT:JQZESP capacity Sedated   The following clinical team members were present during this meeting:MD RN  The following were discussed:Patient's diagnosis: Acute hypoxic respiratory failure with acute encephalopathy, Patient's progosis: Unable to determine and Goals for treatment: Full  Additional follow-up to be provided: Wife says she would like to keep patient full code even after extubation.  Time spent during discussion: 16 minutes  Marielys Trinidad, MD

## 2017-12-03 NOTE — Progress Notes (Signed)
Dr Mortimer Fries notified of patients blood pressure being elevated. At this point no additional orders. Patient being weaned.

## 2017-12-03 NOTE — Progress Notes (Signed)
Spoke with Aura Fey NP regarding patients [place] results. He will discuss with Dr. Mortimer Fries. No interventions at this

## 2017-12-04 LAB — BASIC METABOLIC PANEL
Anion gap: 5 (ref 5–15)
BUN: 29 mg/dL — ABNORMAL HIGH (ref 8–23)
CHLORIDE: 107 mmol/L (ref 98–111)
CO2: 33 mmol/L — ABNORMAL HIGH (ref 22–32)
CREATININE: 0.95 mg/dL (ref 0.61–1.24)
Calcium: 8.1 mg/dL — ABNORMAL LOW (ref 8.9–10.3)
Glucose, Bld: 162 mg/dL — ABNORMAL HIGH (ref 70–99)
Potassium: 3.2 mmol/L — ABNORMAL LOW (ref 3.5–5.1)
SODIUM: 145 mmol/L (ref 135–145)

## 2017-12-04 LAB — VDRL, CSF: VDRL Quant, CSF: NONREACTIVE

## 2017-12-04 LAB — CULTURE, BLOOD (ROUTINE X 2)
CULTURE: NO GROWTH
Culture: NO GROWTH
Special Requests: ADEQUATE
Special Requests: ADEQUATE

## 2017-12-04 LAB — HERPES SIMPLEX VIRUS(HSV) DNA BY PCR
HSV 1 DNA: NEGATIVE
HSV 2 DNA: NEGATIVE

## 2017-12-04 LAB — CBC
HCT: 33.8 % — ABNORMAL LOW (ref 40.0–52.0)
Hemoglobin: 11.4 g/dL — ABNORMAL LOW (ref 13.0–18.0)
MCH: 32.6 pg (ref 26.0–34.0)
MCHC: 33.7 g/dL (ref 32.0–36.0)
MCV: 96.9 fL (ref 80.0–100.0)
PLATELETS: 93 10*3/uL — AB (ref 150–440)
RBC: 3.48 MIL/uL — AB (ref 4.40–5.90)
RDW: 13.3 % (ref 11.5–14.5)
WBC: 6.6 10*3/uL (ref 3.8–10.6)

## 2017-12-04 LAB — GLUCOSE, CAPILLARY
GLUCOSE-CAPILLARY: 125 mg/dL — AB (ref 70–99)
GLUCOSE-CAPILLARY: 109 mg/dL — AB (ref 70–99)
GLUCOSE-CAPILLARY: 169 mg/dL — AB (ref 70–99)
Glucose-Capillary: 136 mg/dL — ABNORMAL HIGH (ref 70–99)
Glucose-Capillary: 202 mg/dL — ABNORMAL HIGH (ref 70–99)
Glucose-Capillary: 212 mg/dL — ABNORMAL HIGH (ref 70–99)

## 2017-12-04 LAB — MAGNESIUM: MAGNESIUM: 2 mg/dL (ref 1.7–2.4)

## 2017-12-04 LAB — HEPARIN LEVEL (UNFRACTIONATED)
HEPARIN UNFRACTIONATED: 0.38 [IU]/mL (ref 0.30–0.70)
Heparin Unfractionated: 0.43 IU/mL (ref 0.30–0.70)

## 2017-12-04 LAB — PHOSPHORUS: Phosphorus: 2.9 mg/dL (ref 2.5–4.6)

## 2017-12-04 MED ORDER — POTASSIUM CHLORIDE 20 MEQ PO PACK
40.0000 meq | PACK | Freq: Once | ORAL | Status: AC
  Administered 2017-12-04: 40 meq
  Filled 2017-12-04: qty 2

## 2017-12-04 MED ORDER — SENNOSIDES-DOCUSATE SODIUM 8.6-50 MG PO TABS: 1.0000 | ORAL_TABLET | Freq: Two times a day (BID) | ORAL | Status: DC

## 2017-12-04 NOTE — Progress Notes (Signed)
CHIEF COMPLAINT:   Chief Complaint  Patient presents with  . Weakness  . Dizziness  resp failure  Subjective  Remains intaubted,on full vent support Failed SAT/SBT due to resp muscle fatigue Plan repeat SAT/SBT today Continue acyclovir HSV pending  REVIEW OF SYSTEMS  PATIENT IS UNABLE TO PROVIDE COMPLETE REVIEW OF SYSTEM S DUE TO SEVERE CRITICAL ILLNESS AND ENCEPHALOPATHY     Objective  PHYSICAL EXAMINATION:  GENERAL:critically ill appearing, +resp distress HEAD: Normocephalic, atraumatic.  EYES: Pupils equal, round, reactive to light.  No scleral icterus.  MOUTH: Moist mucosal membrane. NECK: Supple. No thyromegaly. No nodules. No JVD.  PULMONARY: +rhonchi, +wheezing CARDIOVASCULAR: S1 and S2. Regular rate and rhythm. No murmurs, rubs, or gallops.  GASTROINTESTINAL: Soft, nontender, -distended. No masses. Positive bowel sounds. No hepatosplenomegaly.  MUSCULOSKELETAL: No swelling, clubbing, or edema.  NEUROLOGIC: obtunded SKIN:intact,warm,dry    VITALS:  height is 5' 9"  (1.753 m) and weight is 132 lb 4.4 oz (60 kg). His oral temperature is 99.2 F (37.3 C). His blood pressure is 90/60 and his pulse is 105 (abnormal). His respiration is 22 (abnormal) and oxygen saturation is 94%.   I personally reviewed Labs under Results section.  Radiology Reports US Venous Img Upper Uni Right  Result Date: 12/03/2017 CLINICAL DATA:  Right upper extremity pain and edema. Evaluate for DVT. EXAM: RIGHT UPPER EXTREMITY VENOUS DOPPLER ULTRASOUND TECHNIQUE: Gray-scale sonography with graded compression, as well as color Doppler and duplex ultrasound were performed to evaluate the upper extremity deep venous system from the level of the subclavian vein and including the jugular, axillary, basilic, radial, ulnar and upper cephalic vein. Spectral Doppler was utilized to evaluate flow at rest and with distal augmentation maneuvers. COMPARISON:  Chest radiograph-12/02/2018 FINDINGS:  Contralateral Subclavian Vein: Respiratory phasicity is normal and symmetric with the symptomatic side. No evidence of thrombus. Normal compressibility. Internal Jugular Vein: No evidence of thrombus. Normal compressibility, respiratory phasicity and response to augmentation. Subclavian Vein: There is nonocclusive DVT surrounding the right upper extremity approach PICC line at the level of the peripheral aspect of the right subclavian vein (images 10 and 11). Axillary Vein: No evidence of thrombus. Normal compressibility, respiratory phasicity and response to augmentation. Cephalic Vein: There is mixed echogenic occlusive thrombus involving the distal aspect of the cephalic vein (image 18 through 21). The proximal aspect of the cephalic vein appears widely patent (image 16). Basilic Vein: No evidence of thrombus. Normal compressibility, respiratory phasicity and response to augmentation. Brachial Veins: No evidence of thrombus. Normal compressibility, respiratory phasicity and response to augmentation. Radial Veins: No evidence of thrombus though rouleaux flow was demonstrated. Normal compressibility, respiratory phasicity and response to augmentation. Ulnar Veins: No evidence of thrombus. Normal compressibility, respiratory phasicity and response to augmentation. Venous Reflux:  None visualized. Other Findings:  None visualized. IMPRESSION: 1. The examination is positive for nonocclusive DVT surrounding the right upper extremity approach PICC line at level peripheral aspect of the right subclavian vein. 2. Examination is positive for occlusive SVT involving the cephalic vein at the level the mid and distal humerus. Electronically Signed   By: Sandi Mariscal M.D.   On: 12/03/2017 15:03   Dg Fluoro Guided Loc Of Needle/cath Tip For Spinal Inject Lt  Result Date: 12/02/2017 CLINICAL DATA:  Suspected meningitis.  Encephalopathy. EXAM: DIAGNOSTIC LUMBAR PUNCTURE UNDER FLUOROSCOPIC GUIDANCE FLUOROSCOPY TIME:  Fluoroscopy  Time:  18 seconds Radiation Exposure Index (if provided by the fluoroscopic device): 1.1 mGy Number of Acquired Spot Images: 0 PROCEDURE: Informed consent  was obtained from the patient prior to the procedure, including potential complications of headache, allergy, and pain. With the patient prone, the lower back was prepped with Betadine. 1% Lidocaine was used for local anesthesia. Lumbar puncture was performed at the L3-4 level using a 22 gauge needle with return of clear CSF. 9 ml of CSF were obtained for laboratory studies. The patient tolerated the procedure well and there were no apparent complications. IMPRESSION: Successful fluoroscopic guided lumbar puncture. Electronically Signed   By: Kathreen Devoid   On: 12/02/2017 10:07        Assessment/Plan:  77 yo male with acute encephalopathy ?etiology leading to inability to protect airway leading to severe resp failure  Severe Hypoxic and Hypercapnic Respiratory Failure -continue Full MV support -continue Bronchodilator Therapy -Wean Fio2 and PEEP as tolerated -will perform SAT/SBTTwhen respiratory parameters are met    NEUROLOGY - intubated and sedated - minimal sedation to achieve a RASS goal: -1 Follow up neuro recs Continue acyclovir and await HSV PCR  DVT/GI prx TF's at goal   Critical Care Time devoted to patient care services described in this note is 32 minutes.   Overall, patient is critically ill, prognosis is guarded.    Corrin Parker, M.D.  [facility]  Medical Director [place] Director [facility] Cardio-Pulmonary Department

## 2017-12-04 NOTE — Progress Notes (Signed)
Subjective: Patient unable to tolerate weaning on yesterday.  Now back on sedation  Objective: Current vital signs: BP 126/77   Pulse (!) 101   Temp 99 F (37.2 C) (Oral)   Resp (!) 24   Ht _0  (1.753 m)   Wt 60 kg (132 lb 4.4 oz)   SpO2 93%   BMI 19.53 kg/m  Vital signs in last 24 hours: Temp:  [97.5 F (36.4 C)-99.2 F (37.3 C)] 99 F (37.2 C) (08/07 0800) Pulse Rate:  [31-111] 101 (08/07 1300) Resp:  [16-25] 24 (08/07 1300) BP: (84-126)/(57-81) 126/77 (08/07 1300) SpO2:  [89 %-100 %] 93 % (08/07 1300) FiO2 (%):  [35 %] 35 % (08/07 1116) Weight:  [60 kg (132 lb 4.4 oz)] 60 kg (132 lb 4.4 oz) (08/07 0442)  Intake/Output from previous day: 08/06 0701 - 08/07 0700 In: 1043.8 [I.V.:500.1; NG/GT:429.9; IV Piggyback:113.9] Out: 2250 [Urine:2250] Intake/Output this shift: No intake/output data recorded. Nutritional status:  Diet Order           Diet NPO time specified  Diet effective now          Neurologic Exam: Mental Status: Patient does not respond to verbal stimuli.  Mild grimace to deep sternal rub.  Does not follow commands.  No verbalizations noted.  Cranial Nerves: II: patient does not respond confrontation bilaterally, pupils right 2 mm, left 2 mm,and reactive bilaterally III,IV,VI: doll's response present bilaterally.  V,VII: corneal reflex reduced bilaterally  VIII: patient does not respond to verbal stimuli IX,X: gag reflex reduced, XI: trapezius strength unable to test bilaterally XII: tongue strength unable to test Motor: Extremities flaccid throughout.  No spontaneous movement noted.  No purposeful movements noted.   Lab Results: Basic Metabolic Panel: Recent Labs  Lab 12/01/17 0520 12/01/17 1728 12/02/17 0808 12/03/17 0403 12/04/17 0539  NA 137 144 143 144 145  K 2.6* 3.5 3.2* 3.9 3.2*  CL 99 107 104 108 107  CO2 28 32 30 31 33*  GLUCOSE 166* 197* 140* 215* 162*  BUN 34* 35* 29* 29* 29*  CREATININE 1.40* 1.37* 1.31* 1.22 0.95   CALCIUM 7.5* 8.6* 8.2* 8.4* 8.1*  MG 1.6* 2.2 1.9 2.3 2.0  PHOS 2.0* 2.8 3.7 3.6 2.9    Liver Function Tests: Recent Labs  Lab 11/29/17 1649 12/02/17 0808  AST 22 29  ALT 30 33  ALKPHOS 71 44  BILITOT 0.7 0.7  PROT 7.0 4.9*  ALBUMIN 4.5 2.6*   No results for input(s): LIPASE, AMYLASE in the last 168 hours. Recent Labs  Lab 11/30/17 0323  AMMONIA 39*    CBC: Recent Labs  Lab 11/29/17 1649 12/01/17 0520 12/02/17 0808 12/04/17 0539  WBC 11.5* 13.1* 8.0 6.6  NEUTROABS  --  9.8* 6.5  --   HGB 15.7 12.3* 12.8* 11.4*  HCT 46.1 35.0* 38.2* 33.8*  MCV 95.9 96.9 95.7 96.9  PLT 131* 118* 103* 93*    Cardiac Enzymes: Recent Labs  Lab 11/29/17 1649  TROPONINI <0.03    Lipid Panel: Recent Labs  Lab 11/30/17 1902 12/03/17 1704  TRIG 141 108    CBG: Recent Labs  Lab 12/03/17 1930 12/03/17 2348 12/04/17 0336 12/04/17 0711 12/04/17 1149  GLUCAP 126* 165* 125* 136* 202*    Microbiology: Results for orders placed or performed during the hospital encounter of 11/29/17  Urine culture     Status: Abnormal   Collection Time: 11/29/17 11:32 PM  Result Value Ref Range Status   Specimen Description  Final    URINE, RANDOM Performed at [facility], [address], [place], [place] [address]    Special Requests   Final    NONE Performed at [facility], [address] [place], [place] [address]    Culture (A)  Final    <10,000 COLONIES/mL INSIGNIFICANT GROWTH Performed at [facility] [address] [place], [place] [address]    Report Status 12/01/2017 FINAL  Final  Blood Culture (routine x 2)     Status: None   Collection Time: 11/29/17 11:32 PM  Result Value Ref Range Status   Specimen Description BLOOD LEFT ANTECUBITAL  Final   Special Requests   Final    BOTTLES DRAWN AEROBIC AND ANAEROBIC Blood Culture adequate volume   Culture   Final    NO GROWTH 5 DAYS Performed at [facility], [address]  [place], [place] [address]    Report Status 12/04/2017 FINAL  Final  Blood Culture (routine x 2)     Status: None   Collection Time: 11/29/17 11:32 PM  Result Value Ref Range Status   Specimen Description BLOOD RIGHT ANTECUBITAL  Final   Special Requests   Final    BOTTLES DRAWN AEROBIC AND ANAEROBIC Blood Culture adequate volume   Culture   Final    NO GROWTH 5 DAYS Performed at [facility], [address] [place], [place] [address]    Report Status 12/04/2017 FINAL  Final  MRSA PCR Screening     Status: None   Collection Time: 11/30/17 11:59 AM  Result Value Ref Range Status   MRSA by PCR NEGATIVE NEGATIVE Final    Comment:        The GeneXpert MRSA Assay (FDA approved for NASAL specimens only), is one component of a comprehensive MRSA colonization surveillance program. It is not intended to diagnose MRSA infection nor to guide or monitor treatment for MRSA infections. Performed at [facility], [address] [place], [place] [address]   Culture, blood (routine x 2)     Status: None (Preliminary result)   Collection Time: 12/02/17  8:08 AM  Result Value Ref Range Status   Specimen Description BLOOD BLOOD RIGHT HAND  Final   Special Requests   Final    BOTTLES DRAWN AEROBIC AND ANAEROBIC Blood Culture adequate volume   Culture   Final    NO GROWTH 2 DAYS Performed at [facility], [address] [place], [place] [address]    Report Status PENDING  Incomplete  Culture, blood (routine x 2)     Status: None (Preliminary result)   Collection Time: 12/02/17  8:08 AM  Result Value Ref Range Status   Specimen Description BLOOD BLOOD RIGHT WRIST  Final   Special Requests   Final    BOTTLES DRAWN AEROBIC AND ANAEROBIC Blood Culture results may not be optimal due to an inadequate volume of blood received in culture bottles   Culture   Final    NO GROWTH 2 DAYS Performed at [facility], [address], [place], [place] [address]     Report Status PENDING  Incomplete  CSF culture     Status: None (Preliminary result)   Collection Time: 12/02/17  9:40 AM  Result Value Ref Range Status   Specimen Description   Final    CSF Performed at [facility], [place] [address], [place],  [address]    Special Requests   Final  NONE Performed at [facility], [place], [place] [address]    Gram Stain   Final    RARE WBC SEEN MODERATE RED BLOOD CELLS NO ORGANISMS SEEN    Culture   Final    NO GROWTH 2 DAYS Performed at [facility], [place] [address], [place], [place] [address]    Report Status PENDING  Incomplete  Culture, fungus without smear     Status: None (Preliminary result)   Collection Time: 12/02/17  9:40 AM  Result Value Ref Range Status   Specimen Description   Final    CSF Performed at [facility], [address] [place], [place] [address]    Special Requests   Final    NONE Performed at [facility], [address], [place], [place] [address]    Culture   Final    NO FUNGUS ISOLATED AFTER 2 DAYS Performed at [facility], [place] [address] [place], [place] [address]    Report Status PENDING  Incomplete    Coagulation Studies: Recent Labs    12/03/17 1611  LABPROT 15.8*  INR 1.27    Imaging: US Venous Img Upper Uni Right  Result Date: 12/03/2017 CLINICAL DATA:  Right upper extremity pain and edema. Evaluate for DVT. EXAM: RIGHT UPPER EXTREMITY VENOUS DOPPLER ULTRASOUND TECHNIQUE: Gray-scale sonography with graded compression, as well as color Doppler and duplex ultrasound were performed to evaluate the upper extremity deep venous system from the level of the subclavian vein and including the jugular, axillary, basilic, radial, ulnar and upper cephalic vein. Spectral Doppler was utilized to evaluate flow at rest and with distal augmentation maneuvers. COMPARISON:  Chest radiograph-12/02/2018 FINDINGS: Contralateral Subclavian Vein:  Respiratory phasicity is normal and symmetric with the symptomatic side. No evidence of thrombus. Normal compressibility. Internal Jugular Vein: No evidence of thrombus. Normal compressibility, respiratory phasicity and response to augmentation. Subclavian Vein: There is nonocclusive DVT surrounding the right upper extremity approach PICC line at the level of the peripheral aspect of the right subclavian vein (images 10 and 11). Axillary Vein: No evidence of thrombus. Normal compressibility, respiratory phasicity and response to augmentation. Cephalic Vein: There is mixed echogenic occlusive thrombus involving the distal aspect of the cephalic vein (image 18 through 21). The proximal aspect of the cephalic vein appears widely patent (image 16). Basilic Vein: No evidence of thrombus. Normal compressibility, respiratory phasicity and response to augmentation. Brachial Veins: No evidence of thrombus. Normal compressibility, respiratory phasicity and response to augmentation. Radial Veins: No evidence of thrombus though rouleaux flow was demonstrated. Normal compressibility, respiratory phasicity and response to augmentation. Ulnar Veins: No evidence of thrombus. Normal compressibility, respiratory phasicity and response to augmentation. Venous Reflux:  None visualized. Other Findings:  None visualized. IMPRESSION: 1. The examination is positive for nonocclusive DVT surrounding the right upper extremity approach PICC line at level peripheral aspect of the right subclavian vein. 2. Examination is positive for occlusive SVT involving the cephalic vein at the level the mid and distal humerus. Electronically Signed   By: Sandi Mariscal M.D.   On: 12/03/2017 15:03    Medications:  I have reviewed the patient's current medications. Scheduled: . amoxicillin  1,000 mg Per Tube Q12H  . chlorhexidine gluconate (MEDLINE KIT)  15 mL Mouth Rinse BID  . clarithromycin  500 mg Per Tube Q12H  . insulin aspart  0-9 Units  Subcutaneous Q4H  . insulin aspart  5 Units Subcutaneous Q4H  . ipratropium-albuterol  3  mL Nebulization Q6H  . mouth rinse  15 mL Mouth Rinse 10 times per day  . multivitamin  15 mL Per Tube Daily  . pantoprazole sodium  40 mg Per Tube BID  . polyethylene glycol  17 g Oral Daily  . senna-docusate  2 tablet Per Tube BID  . sodium chloride flush  10-40 mL Intracatheter Q12H    Assessment/Plan: Patient back on sedation.  Work up to date unremarkable.  HSV remains pending therefore patient remains on Acyclovir.    Will re-evaluate once sedation decreased.     LOS: 5 days   Alexis Goodell, MD Neurology [phone] 12/04/2017  1:41 PM

## 2017-12-04 NOTE — Progress Notes (Signed)
Sedation weaned to off for assessment. Pt arouses to voice. Opens eyes and follows simple commands. Grips weakly with both hands. Nods, shakes head appropriately. Will monitor closely. Wife at bedside and updated.

## 2017-12-04 NOTE — Progress Notes (Signed)
Pharmacy Antibiotic Note  Marc [name] is a 77 y.o. male admitted on 11/29/2017 with possible meningitis. Patient previously being treated for H. Pylori as an outpatient with amoxicillin, clarithromycin, and pantoprazole. Pharmacy has been consulted for acyclovir dosing.  Plan: Antibiotics for meningitis stopped on 8/6. Amoxicillin 1000mg  VT Q12hr, clarithromycin 500mg  VT Q12hr, and pantoprazole 40mg  VT BID reinitiated on 8/6. Per CCM will treat through original stop date of 8/16.   Continue acyclovir 600mg  IV Q8hr.   Height: 5\' 9"  (175.3 cm) Weight: 132 lb 4.4 oz (60 kg) IBW/kg (Calculated) : 70.7  Temp (24hrs), Avg:98.7 F (37.1 C), Min:97.8 F (36.6 C), Max:99.2 F (37.3 C)  Recent Labs  Lab 11/29/17 1649 11/29/17 2332 11/30/17 0323 12/01/17 0520 12/01/17 1728 12/02/17 0808 12/03/17 0403 12/03/17 0717 12/04/17 0539  WBC 11.5*  --   --  13.1*  --  8.0  --   --  6.6  CREATININE 0.91  --  0.90 1.40* 1.37* 1.31* 1.22  --  0.95  LATICACIDVEN  --  0.8 1.0  --   --   --   --   --   --   VANCOTROUGH  --   --   --   --   --   --   --  13*  --     Estimated Creatinine Clearance: 57 mL/min (by C-G formula based on SCr of 0.95 mg/dL).    Allergies  Allergen Reactions  . Glimepiride   . Tape Itching and Rash    Antimicrobials this admission: Zosyn 8/2 >> 8/3 Vancomycin 8/2 >> 8/6 Acyclovir 8/3 >>  Ampicillin 8/3 >> 8/6  Ceftriaxone 8/3 >> 8/6  Amoxicillin 8/6 >> 8/16  Clarithromycin 8/6 >> 8/16   Dose adjustments this admission: 8/4 Ampicillin renally adjusted from Q4hr to Q6hr.  8/4 Vancomycin empirically adjusted to Q24hr.  8/4 Acyclovir renally adjusted from Q8hr to Q12hr.  8/6 Acyclovir Q8hr dosing resumed.   Microbiology results: 8/2 BCx: no growth x 4 days  8/2 UCx: < 10K colonies  8/3 MRSA PCR: negative  8/5 CSF culture: no growth x 2 days  8/5 BCx: no growth x 2 days  8/5 CSF fungus: no fungus isolated after 2 days  8/5 CSF HSV: pending   Thank you for  allowing pharmacy to be a part of this patient's care.  Simpson,Michael L 12/04/2017 4:25 PM

## 2017-12-04 NOTE — Progress Notes (Addendum)
[facility] for heparin drip management  Indication: DVT  Allergies  Allergen Reactions  . Glimepiride   . Tape Itching and Rash    Patient Measurements: Height: 5' 9"  (175.3 cm) Weight: 134 lb 7.7 oz (61 kg) IBW/kg (Calculated) : 70.7   Vital Signs: Temp: 98.6 F (37 C) (08/06 2000) Temp Source: Oral (08/06 2000) BP: 101/62 (08/06 2300) Pulse Rate: 106 (08/06 2300)  Labs: Recent Labs    12/01/17 0520 12/01/17 1728 12/02/17 0808 12/03/17 0403 12/03/17 1611 12/03/17 2354  HGB 12.3*  --  12.8*  --   --   --   HCT 35.0*  --  38.2*  --   --   --   PLT 118*  --  103*  --   --   --   APTT  --   --   --   --  38*  --   LABPROT  --   --   --   --  15.8*  --   INR  --   --   --   --  1.27  --   HEPARINUNFRC  --   --   --   --   --  0.43  CREATININE 1.40* 1.37* 1.31* 1.22  --   --     Estimated Creatinine Clearance: 45.1 mL/min (by C-G formula based on SCr of 1.22 mg/dL).   Medical History: Past Medical History:  Diagnosis Date  . Anxiety   . BPH (benign prostatic hyperplasia)   . Colon adenomas    history of  . Diverticulosis   . Hyperlipidemia   . Hypertension     Medications:  Scheduled:  . amoxicillin  1,000 mg Per Tube Q12H  . chlorhexidine gluconate (MEDLINE KIT)  15 mL Mouth Rinse BID  . clarithromycin  500 mg Per Tube Q12H  . insulin aspart  0-9 Units Subcutaneous Q4H  . insulin aspart  5 Units Subcutaneous Q4H  . ipratropium-albuterol  3 mL Nebulization Q6H  . mouth rinse  15 mL Mouth Rinse 10 times per day  . multivitamin  15 mL Per Tube Daily  . pantoprazole sodium  40 mg Per Tube BID  . polyethylene glycol  17 g Oral Daily  . senna-docusate  2 tablet Per Tube BID  . sodium chloride flush  10-40 mL Intracatheter Q12H   Infusions:  . sodium chloride 500 mL (12/03/17 2214)  . acyclovir Stopped (12/03/17 2300)  . feeding supplement (VITAL HIGH PROTEIN) 1,000 mL (12/03/17 2211)  . heparin 1,000 Units/hr  (12/03/17 2200)  . norepinephrine (LEVOPHED) Adult infusion Stopped (12/03/17 0945)  . propofol (DIPRIVAN) infusion 40 mcg/kg/min (12/03/17 2211)    Assessment: Pharmacy consulted for heparin drip management for 77 yo male admitted with AMS and being treated for DVT of right upper extremety. Patient remains sedated and requiring mechanical ventilation. Patient last received enoxaparin 63m SQ at 0716 on 8/6.   Goal of Therapy:  Heparin level 0.3-0.7 units/ml Monitor platelets by anticoagulation protocol: Yes   Plan:  Will give heparin bolus of 1500 units (half typical bolus) due to recent enoxaparin administration. Will continue heparin at 1000 units/hr. Will obtain heparin level at 0000 on 8/7.   8/7 0000 heparin level 0.43. Continue current regimen. Recheck with AM labs to confirm. CBC in AM.  8/7 0500 heparin level 0.38. Continue current regimen. Recheck heparin level and CBC with tomorrow AM labs.   Pharmacy will continue to monitor and adjust per consult.  Marion Rosenberry S 12/04/2017,12:21 AM

## 2017-12-04 NOTE — Progress Notes (Signed)
Pharmacy Electrolyte/Glucose/Constipation Monitoring Consult:  Pharmacy consulted to assist in monitoring and replacing electrolytes in this 77 y.o. male admitted on 11/29/2017 with Weakness and Dizziness  Patient is currently sedated on propofol. Patient ordered Miralax and senna/docusate 2 tabs BID.   Labs:  Sodium (mmol/L)  Date Value  12/04/2017 145   Potassium (mmol/L)  Date Value  12/04/2017 3.2 (L)   Magnesium (mg/dL)  Date Value  12/04/2017 2.0   Phosphorus (mg/dL)  Date Value  12/04/2017 2.9   Calcium (mg/dL)  Date Value  12/04/2017 8.1 (L)   Albumin (g/dL)  Date Value  12/02/2017 2.6 (L)    Assessment/Plan: 1. Electrolytes: Potassium 41mEq VT x 2 doses. Goal potassium ~ 4 and goal magnesium ~ 2. Will recheck electrolytes with am labs.   2. Glucose management: continue SSI. Continue tube feeding coverage to Novolog 5units Q4hr.   3. Constipation management: Patient with bowel movements on 8/6 and 8/7. Will discontinue miralax and decrease senna/docusate to 1 tab daily.   Pharmacy will continue to monitor and adjust per consult.   Cornelious Diven L 12/04/2017 3:35 PM

## 2017-12-04 NOTE — Progress Notes (Signed)
[place] [facility] [place] NAME: Marc [name]    MR#:  [id number]  DATE OF BIRTH:  Feb 21, 1941  SUBJECTIVE:   Patient is intubated.  Plan for possible extubation today REVIEW OF SYSTEMS:    Intubated and sedated  Tolerating Diet: Tube feeds     DRUG ALLERGIES:   Allergies  Allergen Reactions  . Glimepiride   . Tape Itching and Rash    VITALS:  Blood pressure 118/69, pulse 99, temperature 99 F (37.2 C), temperature source Oral, resp. rate (!) 21, height 5\' 9"  (1.753 m), weight 132 lb 4.4 oz (60 kg), SpO2 99 %.  PHYSICAL EXAMINATION:  Constitutional: Appears critically ill-appearing and intubated HENT: Normocephalic. . Intubated Eyes: Conjunctivae are normal.  neck:. No JVD. No tracheal deviation. CVS: RRR, S1/S2 +, no murmurs, no gallops, no carotid bruit.  Pulmonary: Effort and breath sounds normal, no stridor, rhonchi, wheezes, rales.  Abdominal: Soft. BS +,  no distension, tenderness, rebound or guarding.  Musculoskeletal: N No edema and no tenderness.  Neuro: Sedated  skin: Skin is warm and dry. No rash noted. Psychiatric: Sedated    LABORATORY PANEL:   CBC Recent Labs  Lab 12/04/17 0539  WBC 6.6  HGB 11.4*  HCT 33.8*  PLT 93*   ------------------------------------------------------------------------------------------------------------------  Chemistries  Recent Labs  Lab 12/02/17 0808  12/04/17 0539  NA 143   < > 145  K 3.2*   < > 3.2*  CL 104   < > 107  CO2 30   < > 33*  GLUCOSE 140*   < > 162*  BUN 29*   < > 29*  CREATININE 1.31*   < > 0.95  CALCIUM 8.2*   < > 8.1*  MG 1.9   < > 2.0  AST 29  --   --   ALT 33  --   --   ALKPHOS 44  --   --   BILITOT 0.7  --   --    < > = values in this interval not displayed.   ------------------------------------------------------------------------------------------------------------------  Cardiac Enzymes Recent Labs  Lab 11/29/17 1649  TROPONINI <0.03    ------------------------------------------------------------------------------------------------------------------  RADIOLOGY:  US Venous Img Upper Uni Right  Result Date: 12/03/2017 CLINICAL DATA:  Right upper extremity pain and edema. Evaluate for DVT. EXAM: RIGHT UPPER EXTREMITY VENOUS DOPPLER ULTRASOUND TECHNIQUE: Gray-scale sonography with graded compression, as well as color Doppler and duplex ultrasound were performed to evaluate the upper extremity deep venous system from the level of the subclavian vein and including the jugular, axillary, basilic, radial, ulnar and upper cephalic vein. Spectral Doppler was utilized to evaluate flow at rest and with distal augmentation maneuvers. COMPARISON:  Chest radiograph-12/02/2018 FINDINGS: Contralateral Subclavian Vein: Respiratory phasicity is normal and symmetric with the symptomatic side. No evidence of thrombus. Normal compressibility. Internal Jugular Vein: No evidence of thrombus. Normal compressibility, respiratory phasicity and response to augmentation. Subclavian Vein: There is nonocclusive DVT surrounding the right upper extremity approach PICC line at the level of the peripheral aspect of the right subclavian vein (images 10 and 11). Axillary Vein: No evidence of thrombus. Normal compressibility, respiratory phasicity and response to augmentation. Cephalic Vein: There is mixed echogenic occlusive thrombus involving the distal aspect of the cephalic vein (image 18 through 21). The proximal aspect of the cephalic vein appears widely patent (image 16). Basilic Vein: No evidence of thrombus. Normal compressibility, respiratory phasicity and response to augmentation. Brachial Veins: No evidence of thrombus. Normal compressibility,  respiratory phasicity and response to augmentation. Radial Veins: No evidence of thrombus though rouleaux flow was demonstrated. Normal compressibility, respiratory phasicity and response to augmentation. Ulnar Veins: No  evidence of thrombus. Normal compressibility, respiratory phasicity and response to augmentation. Venous Reflux:  None visualized. Other Findings:  None visualized. IMPRESSION: 1. The examination is positive for nonocclusive DVT surrounding the right upper extremity approach PICC line at level peripheral aspect of the right subclavian vein. 2. Examination is positive for occlusive SVT involving the cephalic vein at the level the mid and distal humerus. Electronically Signed   By: Sandi Mariscal M.D.   On: 12/03/2017 15:03     ASSESSMENT AND PLAN:   77 year old male with history of essential hypertension who presented to the emergency room initially due to generalized weakness and weight loss.   1.  Acute hypoxic respiratory failure due to inability to protect airway/responsiveness and patient was intubated by ED physician. Continue vent management as per intensivist Possible weaning trial today   2.  Hypothermia: This has resolved He is status post LP which does not appear to be bacterial meningitis  3.  Acute encephalopathy of unclear etiology:  CT head without acute pathology MRI brain essentially unremarkable  Follow-up on HSV Continue acyclovir for now EEG characterized by slowing which is consistent with normal drowsy state.  No epileptic activity is noted.  Neurology consultation appreciated.    4.  Weight loss: Work-up after patient is more stable  5.  Diabetes with A1c of 7.4 Sliding scale  6.  H. pylori gastritis: Continue current therapy.   7.  Hypotension after intubation: Weaned off of norepinephrine  8.  Hypokalemia: Replete as needed.    CODE STATUS: FULL  TOTAL TIME TAKING CARE OF THIS PATIENT: 21 minutes.   Patient remains critically ill.  may benefit from palliative care consultation.   POSSIBLE D/C ??, DEPENDING ON CLINICAL CONDITION.   Jasmaine Rochel M.D on 12/04/2017 at 10:48 AM  Between 7am to 6pm - Pager - [phone] After 6pm go to [email or web address] -  password [email or web address] [facility]  Office  [phone]  CC: Primary care physician; Tracie Harrier, MD  Note: This dictation was prepared with Dragon dictation along with smaller phrase technology. Any transcriptional errors that result from this process are unintentional.

## 2017-12-04 NOTE — Progress Notes (Signed)
Pt anxious, HR 120, RR 40. O2 sat 84%. RT in to evaluate. Pt placed back on sedation. Provider aware.

## 2017-12-04 NOTE — Progress Notes (Signed)
[place] for heparin drip management  Indication: DVT  Allergies  Allergen Reactions  . Glimepiride   . Tape Itching and Rash    Patient Measurements: Height: 5' 9"  (175.3 cm) Weight: 132 lb 4.4 oz (60 kg) IBW/kg (Calculated) : 70.7   Vital Signs: Temp: 99 F (37.2 C) (08/07 0800) Temp Source: Oral (08/07 0800) BP: 95/61 (08/07 1500) Pulse Rate: 102 (08/07 1500)  Labs: Recent Labs    12/02/17 0808 12/03/17 0403 12/03/17 1611 12/03/17 2354 12/04/17 0539  HGB 12.8*  --   --   --  11.4*  HCT 38.2*  --   --   --  33.8*  PLT 103*  --   --   --  93*  APTT  --   --  38*  --   --   LABPROT  --   --  15.8*  --   --   INR  --   --  1.27  --   --   HEPARINUNFRC  --   --   --  0.43 0.38  CREATININE 1.31* 1.22  --   --  0.95    Estimated Creatinine Clearance: 57 mL/min (by C-G formula based on SCr of 0.95 mg/dL).   Medical History: Past Medical History:  Diagnosis Date  . Anxiety   . BPH (benign prostatic hyperplasia)   . Colon adenomas    history of  . Diverticulosis   . Hyperlipidemia   . Hypertension     Medications:  Scheduled:  . amoxicillin  1,000 mg Per Tube Q12H  . chlorhexidine gluconate (MEDLINE KIT)  15 mL Mouth Rinse BID  . clarithromycin  500 mg Per Tube Q12H  . insulin aspart  0-9 Units Subcutaneous Q4H  . insulin aspart  5 Units Subcutaneous Q4H  . ipratropium-albuterol  3 mL Nebulization Q6H  . mouth rinse  15 mL Mouth Rinse 10 times per day  . multivitamin  15 mL Per Tube Daily  . pantoprazole sodium  40 mg Per Tube BID  . polyethylene glycol  17 g Oral Daily  . senna-docusate  2 tablet Per Tube BID  . sodium chloride flush  10-40 mL Intracatheter Q12H   Infusions:  . sodium chloride 10 mL/hr at 12/04/17 0600  . acyclovir 600 mg (12/04/17 1336)  . feeding supplement (VITAL HIGH PROTEIN) 55 mL/hr at 12/04/17 0600  . heparin 1,000 Units/hr (12/04/17 1355)  . norepinephrine (LEVOPHED) Adult infusion  Stopped (12/03/17 0945)  . propofol (DIPRIVAN) infusion 20 mcg/kg/min (12/04/17 1423)    Assessment: Pharmacy consulted for heparin drip management for 77 yo male admitted with AMS and being treated for DVT of right upper extremety. Patient remains sedated and requiring mechanical ventilation. Heparin infusing at 1000 units/hr.   Goal of Therapy:  Heparin level 0.3-0.7 units/ml Monitor platelets by anticoagulation protocol: Yes   Plan:  Continue current regimen. Recheck heparin level and CBC with tomorrow AM labs.  Pharmacy will continue to monitor and adjust per consult.   Aidynn Krenn L 12/04/2017,3:33 PM

## 2017-12-05 ENCOUNTER — Inpatient Hospital Stay: Payer: PPO

## 2017-12-05 DIAGNOSIS — R9431 Abnormal electrocardiogram [ECG] [EKG]: Secondary | ICD-10-CM | POA: Diagnosis not present

## 2017-12-05 LAB — BASIC METABOLIC PANEL
Anion gap: 7 (ref 5–15)
BUN: 31 mg/dL — AB (ref 8–23)
CHLORIDE: 107 mmol/L (ref 98–111)
CO2: 31 mmol/L (ref 22–32)
Calcium: 8.2 mg/dL — ABNORMAL LOW (ref 8.9–10.3)
Creatinine, Ser: 1.2 mg/dL (ref 0.61–1.24)
GFR calc Af Amer: >60 mL/min (ref >60)
GFR calc non Af Amer: 57 mL/min — ABNORMAL LOW (ref >60)
Glucose, Bld: 150 mg/dL — ABNORMAL HIGH (ref 70–99)
POTASSIUM: 4 mmol/L (ref 3.5–5.1)
SODIUM: 145 mmol/L (ref 135–145)

## 2017-12-05 LAB — GLUCOSE, CAPILLARY
GLUCOSE-CAPILLARY: 149 mg/dL — AB (ref 70–99)
GLUCOSE-CAPILLARY: 83 mg/dL (ref 70–99)
Glucose-Capillary: 135 mg/dL — ABNORMAL HIGH (ref 70–99)
Glucose-Capillary: 177 mg/dL — ABNORMAL HIGH (ref 70–99)
Glucose-Capillary: 110 mg/dL — ABNORMAL HIGH (ref 70–99)

## 2017-12-05 LAB — BLOOD GAS, ARTERIAL
ACID-BASE EXCESS: 6.6 mmol/L — AB (ref 0.0–2.0)
Bicarbonate: 31.9 mmol/L — ABNORMAL HIGH (ref 20.0–28.0)
FIO2: 0.6
MECHVT: 450 mL
O2 Saturation: 99.6 %
PATIENT TEMPERATURE: 37
PCO2 ART: 48 mmHg (ref 32.0–48.0)
PEEP/CPAP: 10 cmH2O
PH ART: 7.43 (ref 7.350–7.450)
PO2 ART: 175 mmHg — AB (ref 83.0–108.0)
RATE: 16 resp/min

## 2017-12-05 LAB — HEPARIN LEVEL (UNFRACTIONATED)
HEPARIN UNFRACTIONATED: 0.47 [IU]/mL (ref 0.30–0.70)
Heparin Unfractionated: 0.29 IU/mL — ABNORMAL LOW (ref 0.30–0.70)
Heparin Unfractionated: 0.35 IU/mL (ref 0.30–0.70)

## 2017-12-05 LAB — CBC
HEMATOCRIT: 30.1 % — AB (ref 40.0–52.0)
HEMOGLOBIN: 10.2 g/dL — AB (ref 13.0–18.0)
MCH: 32.6 pg (ref 26.0–34.0)
MCHC: 33.8 g/dL (ref 32.0–36.0)
MCV: 96.5 fL (ref 80.0–100.0)
Platelets: 115 10*3/uL — ABNORMAL LOW (ref 150–440)
RBC: 3.12 MIL/uL — AB (ref 4.40–5.90)
RDW: 13.5 % (ref 11.5–14.5)
WBC: 6.4 10*3/uL (ref 3.8–10.6)

## 2017-12-05 LAB — CSF CULTURE W GRAM STAIN: Culture: NO GROWTH

## 2017-12-05 LAB — CSF CULTURE

## 2017-12-05 LAB — OLIGOCLONAL BANDS, CSF + SERM

## 2017-12-05 MED ORDER — ADULT MULTIVITAMIN W/MINERALS CH: 1.0000 | ORAL_TABLET | Freq: Every day | ORAL | Status: DC

## 2017-12-05 MED ORDER — CLARITHROMYCIN 500 MG PO TABS
500.0000 mg | ORAL_TABLET | Freq: Two times a day (BID) | ORAL | Status: DC
  Filled 2017-12-05 (×3): qty 1

## 2017-12-05 MED ORDER — DEXTROSE 5 % IV SOLN
600.0000 mg | Freq: Two times a day (BID) | INTRAVENOUS | Status: DC
  Filled 2017-12-05: qty 12

## 2017-12-05 MED ORDER — AMOXICILLIN 250 MG/5ML PO SUSR
1000.0000 mg | Freq: Two times a day (BID) | ORAL | Status: DC
  Filled 2017-12-05 (×3): qty 20

## 2017-12-05 MED ORDER — DEXMEDETOMIDINE HCL IN NACL 400 MCG/100ML IV SOLN
0.4000 ug/kg/h | INTRAVENOUS | Status: DC
Start: 2017-12-05 — End: 2017-12-06
  Administered 2017-12-05 – 2017-12-06 (×2): 0.4 ug/kg/h via INTRAVENOUS
  Filled 2017-12-05 (×2): qty 100

## 2017-12-05 MED ORDER — IPRATROPIUM-ALBUTEROL 0.5-2.5 (3) MG/3ML IN SOLN
3.0000 mL | RESPIRATORY_TRACT | Status: DC
  Administered 2017-12-05 – 2017-12-06 (×6): 3 mL via RESPIRATORY_TRACT
  Filled 2017-12-05 (×5): qty 3

## 2017-12-05 MED ORDER — ADULT MULTIVITAMIN W/MINERALS CH: 1.0000 | ORAL_TABLET | ORAL | Status: DC

## 2017-12-05 MED ORDER — HEPARIN BOLUS VIA INFUSION
900.0000 [IU] | Freq: Once | INTRAVENOUS | Status: AC
  Administered 2017-12-05: 900 [IU] via INTRAVENOUS
  Filled 2017-12-05: qty 900

## 2017-12-05 MED ORDER — PANTOPRAZOLE SODIUM 40 MG PO TBEC: 40.0000 mg | DELAYED_RELEASE_TABLET | Freq: Every day | ORAL | Status: DC

## 2017-12-05 MED ORDER — LORAZEPAM 2 MG/ML IJ SOLN
1.0000 mg | INTRAMUSCULAR | Status: DC | PRN
  Administered 2017-12-06: 1 mg via INTRAVENOUS
  Filled 2017-12-05: qty 1

## 2017-12-05 MED ORDER — PANTOPRAZOLE SODIUM 40 MG PO TBEC: 40.0000 mg | DELAYED_RELEASE_TABLET | Freq: Two times a day (BID) | ORAL | Status: DC

## 2017-12-05 MED ORDER — BUDESONIDE 0.5 MG/2ML IN SUSP
0.5000 mg | Freq: Two times a day (BID) | RESPIRATORY_TRACT | Status: DC
  Administered 2017-12-05 – 2017-12-09 (×8): 0.5 mg via RESPIRATORY_TRACT
  Filled 2017-12-05 (×8): qty 2

## 2017-12-05 MED ORDER — SENNOSIDES-DOCUSATE SODIUM 8.6-50 MG PO TABS: 1.0000 | ORAL_TABLET | Freq: Two times a day (BID) | ORAL | Status: DC

## 2017-12-05 MED ORDER — LABETALOL HCL 5 MG/ML IV SOLN
10.0000 mg | INTRAVENOUS | Status: DC | PRN
  Administered 2017-12-05 – 2017-12-06 (×3): 10 mg via INTRAVENOUS
  Filled 2017-12-05 (×3): qty 4

## 2017-12-05 NOTE — Progress Notes (Signed)
[place] for heparin drip management  Indication: DVT  Allergies  Allergen Reactions  . Glimepiride   . Tape Itching and Rash    Patient Measurements: Height: 5' 9"  (175.3 cm) Weight: 132 lb 4.4 oz (60 kg) IBW/kg (Calculated) : 70.7   Vital Signs: Temp: 98.3 F (36.8 C) (08/08 0400) Temp Source: Axillary (08/08 0400) BP: 85/60 (08/08 0400) Pulse Rate: 98 (08/08 0400)  Labs: Recent Labs    12/02/17 0808 12/03/17 0403 12/03/17 1611 12/03/17 2354 12/04/17 0539 12/05/17 0334  HGB 12.8*  --   --   --  11.4* 10.2*  HCT 38.2*  --   --   --  33.8* 30.1*  PLT 103*  --   --   --  93* 115*  APTT  --   --  38*  --   --   --   LABPROT  --   --  15.8*  --   --   --   INR  --   --  1.27  --   --   --   HEPARINUNFRC  --   --   --  0.43 0.38 0.29*  CREATININE 1.31* 1.22  --   --  0.95 1.20    Estimated Creatinine Clearance: 45.1 mL/min (by C-G formula based on SCr of 1.2 mg/dL).   Medical History: Past Medical History:  Diagnosis Date  . Anxiety   . BPH (benign prostatic hyperplasia)   . Colon adenomas    history of  . Diverticulosis   . Hyperlipidemia   . Hypertension     Medications:  Scheduled:  . amoxicillin  1,000 mg Per Tube Q12H  . chlorhexidine gluconate (MEDLINE KIT)  15 mL Mouth Rinse BID  . clarithromycin  500 mg Per Tube Q12H  . heparin  900 Units Intravenous Once  . insulin aspart  0-9 Units Subcutaneous Q4H  . insulin aspart  5 Units Subcutaneous Q4H  . ipratropium-albuterol  3 mL Nebulization Q6H  . mouth rinse  15 mL Mouth Rinse 10 times per day  . multivitamin  15 mL Per Tube Daily  . pantoprazole sodium  40 mg Per Tube BID  . senna-docusate  1 tablet Per Tube BID  . sodium chloride flush  10-40 mL Intracatheter Q12H   Infusions:  . sodium chloride 10 mL/hr at 12/04/17 0600  . acyclovir Stopped (12/04/17 2256)  . feeding supplement (VITAL HIGH PROTEIN) 1,000 mL (12/04/17 1836)  . heparin 1,000 Units/hr  (12/04/17 1836)  . norepinephrine (LEVOPHED) Adult infusion Stopped (12/03/17 0945)  . propofol (DIPRIVAN) infusion 30 mcg/kg/min (12/05/17 0231)    Assessment: Pharmacy consulted for heparin drip management for 77 yo male admitted with AMS and being treated for DVT of right upper extremety. Patient remains sedated and requiring mechanical ventilation. Heparin infusing at 1000 units/hr.   Goal of Therapy:  Heparin level 0.3-0.7 units/ml Monitor platelets by anticoagulation protocol: Yes   Plan:  Continue current regimen. Recheck heparin level and CBC with tomorrow AM labs.  8/8 AM heparin level 0.29. 900 unit bolus and increase rate to 1100 units/hr. Recheck in 8 hours.  Pharmacy will continue to monitor and adjust per consult.   Darlyne Schmiesing S 12/05/2017,5:23 AM

## 2017-12-05 NOTE — Progress Notes (Signed)
Nutrition Follow-up  DOCUMENTATION CODES:   Severe malnutrition in context of chronic illness, Underweight  INTERVENTION:  Pending SLP evaluation.  Patient will benefit from oral nutrition supplement once diet able to be advanced in setting of malnutrition. Will continue to monitor.  NUTRITION DIAGNOSIS:   Severe Malnutrition related to chronic illness(etiology unknown at this time) as evidenced by moderate fat depletion, severe fat depletion, moderate muscle depletion, severe muscle depletion.  Ongoing.  GOAL:   Patient will meet greater than or equal to 90% of their needs  Not progressing at this time. Pending diet advancement.  MONITOR:   Vent status, Labs, Weight trends, TF tolerance, Skin, I & O's  REASON FOR ASSESSMENT:   Ventilator    ASSESSMENT:   77 year old male with PMHx of HTN, HLD, anxiety, BPH, hx of colon adenomas, diverticulosis who was admitted with AMS of unclear etiology, required intubation on 8/3 for airway protection, possible PNA.  -Patient was extubated this AM.  Patient a little confused this AM following extubation. OGT was removed. Pending SLP evaluation prior to diet advancement.  Medications reviewed and include: amoxicillin, clarithromycin, Novolog 0-9 units Q4hrs, Novolog 5 units Q4hrs as tube feed coverage, MVI daily, pantoprazole, senna-docusate, heparin gtt.  Labs reviewed: CBG 110-212, BUN 31.  I/O: 1400 mL UOP yesterday (1 mL/kg/hr)   Weight trend: 58.4 kg on 8/8; + 0.3 kg from admission  Diet Order:   Diet Order            Diet NPO time specified  Diet effective now              EDUCATION NEEDS:   No education needs have been identified at this time  Skin:  Skin Assessment: Skin Integrity Issues:(closed incision to back from LP)  Last BM:  12/04/2017 - large type 6  Height:   Ht Readings from Last 1 Encounters:  11/30/17 5\' 9"  (1.753 m)    Weight:   Wt Readings from Last 1 Encounters:  12/05/17 58.4 kg     Ideal Body Weight:  72.7 kg  BMI:  Body mass index is 19.01 kg/m.  Estimated Nutritional Needs:   Kcal:  1690-1950 (MSJ x 1.3-1.5)  Protein:  90-100 grams (1.5-1.8 grams/kg)  Fluid:  1.4-1.7 L/day (25-30 mL/kg)  Willey Blade, MS, RD, LDN Office: [phone] Pager: [phone] After Hours/Weekend Pager: [phone]

## 2017-12-05 NOTE — Progress Notes (Signed)
ANTICOAGULATION CONSULT NOTE  Pharmacy Consult for heparin drip management  Indication: DVT  Patient Measurements: Height: 5\' 9"  (175.3 cm) Weight: 128 lb 12 oz (58.4 kg) IBW/kg (Calculated) : 70.7   Vital Signs: Temp: 99.8 F (37.7 C) (08/08 2030) Temp Source: Oral (08/08 2030) BP: 177/102 (08/08 2130) Pulse Rate: 99 (08/08 2130)  Labs: Recent Labs    12/03/17 0403 12/03/17 1611  12/04/17 0539 12/05/17 0334 12/05/17 1325 12/05/17 2118  HGB  --   --   --  11.4* 10.2*  --   --   HCT  --   --   --  33.8* 30.1*  --   --   PLT  --   --   --  93* 115*  --   --   APTT  --  38*  --   --   --   --   --   LABPROT  --  15.8*  --   --   --   --   --   INR  --  1.27  --   --   --   --   --   HEPARINUNFRC  --   --    < > 0.38 0.29* 0.47 0.35  CREATININE 1.22  --   --  0.95 1.20  --   --    < > = values in this interval not displayed.    Estimated Creatinine Clearance: 43.9 mL/min (by C-G formula based on SCr of 1.2 mg/dL).  Medical History: Past Medical History:  Diagnosis Date  . Anxiety   . BPH (benign prostatic hyperplasia)   . Colon adenomas    history of  . Diverticulosis   . Hyperlipidemia   . Hypertension    Assessment: Pharmacy consulted for heparin drip management for 77 yo male admitted with AMS and being treated for DVT of right upper extremety. Patient remains sedated and requiring mechanical ventilation. 8/8 AM heparin level 0.29. Patient was given 900 unit bolus and rate was increased to 1100 units/hr.   Goal of Therapy:  Heparin level 0.3-0.7 units/ml Monitor platelets by anticoagulation protocol: Yes   08/08 1430 HL therapeutic @ 0.47. Will continue with current heparin rate of 1100 units/hr. Will recheck confirmatory level in 8 hours. CBC with AM labs per protocol.   Plan:   8/8: HL @ 2118 = 0.35 Will continue pt on current rate and recheck HL on 8/9 with AM labs.   Pharmacy will continue to monitor and adjust per consult.   Orene Desanctis,  PharmD Clinical Pharmacist 12/05/2017 9:48 PM

## 2017-12-05 NOTE — Progress Notes (Signed)
Pharmacy Electrolyte/Glucose/Constipation Monitoring Consult:  Pharmacy consulted to assist in monitoring and replacing electrolytes in this 77 y.o. male admitted on 11/29/2017 with Weakness and Dizziness  Patient ordered Miralax and senna/docusate 2 tabs BID.   Labs:  Sodium (mmol/L)  Date Value  12/05/2017 145   Potassium (mmol/L)  Date Value  12/05/2017 4.0   Magnesium (mg/dL)  Date Value  12/04/2017 2.0   Phosphorus (mg/dL)  Date Value  12/04/2017 2.9   Calcium (mg/dL)  Date Value  12/05/2017 8.2 (L)   Albumin (g/dL)  Date Value  12/02/2017 2.6 (L)    Assessment/Plan: 1. Electrolytes:  Goal potassium ~ 4 and goal magnesium ~ 2. No replacement needed at this time. Will recheck electrolytes with am labs.   2. Glucose management: Continue SSI. Patient currently NPO. Will change SSI to with meals when diet is ordered.   3. Constipation management: Patient with bowel movements on 8/6 and 8/7. Continue senna/docusate to 1 tab BID.   Pharmacy will continue to monitor and adjust per consult.   Pernell Dupre, PharmD, BCPS Clinical Pharmacist 12/05/2017 11:02 AM

## 2017-12-05 NOTE — Progress Notes (Signed)
Pharmacy Antibiotic Note  Marc [name] is a 77 y.o. male admitted on 11/29/2017 with possible meningitis. Patient previously being treated for H. Pylori as an outpatient with amoxicillin, clarithromycin, and pantoprazole. Pharmacy has been consulted for acyclovir dosing. -Antibiotics for meningitis stopped on 8/6.  HSV 1/2 Negative.   Plan: Amoxicillin 1000mg  PO Q12hr, clarithromycin 500mg  PO Q12hr, and pantoprazole 40mg  PO BID reinitiated on 8/6. Per CCM will treat through original stop date of 8/16.   Per Dr. Mortimer Fries, Acyclovir will be discontinued. HSV 1/2 (-)  Height: 5\' 9"  (175.3 cm) Weight: 128 lb 12 oz (58.4 kg) IBW/kg (Calculated) : 70.7  Temp (24hrs), Avg:98.5 F (36.9 C), Min:98 F (36.7 C), Max:99.7 F (37.6 C)  Recent Labs  Lab 11/29/17 1649 11/29/17 2332 11/30/17 0323 12/01/17 0520 12/01/17 1728 12/02/17 0808 12/03/17 0403 12/03/17 0717 12/04/17 0539 12/05/17 0334  WBC 11.5*  --   --  13.1*  --  8.0  --   --  6.6 6.4  CREATININE 0.91  --  0.90 1.40* 1.37* 1.31* 1.22  --  0.95 1.20  LATICACIDVEN  --  0.8 1.0  --   --   --   --   --   --   --   VANCOTROUGH  --   --   --   --   --   --   --  13*  --   --     Estimated Creatinine Clearance: 43.9 mL/min (by C-G formula based on SCr of 1.2 mg/dL).    Allergies  Allergen Reactions  . Glimepiride   . Tape Itching and Rash    Antimicrobials this admission: Zosyn 8/2 >> 8/3 Vancomycin 8/2 >> 8/6 Acyclovir 8/3 >>  Ampicillin 8/3 >> 8/6  Ceftriaxone 8/3 >> 8/6  Amoxicillin 8/6 >> 8/16  Clarithromycin 8/6 >> 8/16   Dose adjustments this admission: 8/4 Ampicillin renally adjusted from Q4hr to Q6hr.  8/4 Vancomycin empirically adjusted to Q24hr.  8/4 Acyclovir renally adjusted from Q8hr to Q12hr.  8/6 Acyclovir Q8hr dosing resumed.   Microbiology results: 8/2 BCx: no growth x 4 days  8/2 UCx: < 10K colonies  8/3 MRSA PCR: negative  8/5 CSF culture: no growth x 2 days  8/5 BCx: no growth x 2 days  8/5 CSF  fungus: no fungus isolated after 2 days  8/5 CSF HSV: negative   Thank you for allowing pharmacy to be a part of this patient's care.  Pernell Dupre, PharmD, BCPS Clinical Pharmacist 12/05/2017 10:50 AM

## 2017-12-05 NOTE — Progress Notes (Signed)
Patient's BP running in 159R systolic. NP notified. New orders to come. Will continue to monitor.

## 2017-12-05 NOTE — Progress Notes (Signed)
CHIEF COMPLAINT:   Chief Complaint  Patient presents with  . Weakness  . Dizziness  follow up severe resp failure  Subjective  Remains intaubted,on full vent support Awaiting for family to assess for SAT/SBT Remains critically ill Plan repeat SAT/SBT today Continue acyclovir HSV pending  Cause of resp failure is vrial syndrome with sepsis and pneumonia  REVIEW OF SYSTEMS  PATIENT IS UNABLE TO PROVIDE COMPLETE REVIEW OF SYSTEM S DUE TO SEVERE CRITICAL ILLNESS AND ENCEPHALOPATHY     Objective  PHYSICAL EXAMINATION: GENERAL:critically ill appearing, +resp distress HEAD: Normocephalic, atraumatic.  EYES: Pupils equal, round, reactive to light.  No scleral icterus.  MOUTH: Moist mucosal membrane. NECK: Supple. No thyromegaly. No nodules. No JVD.  PULMONARY: +rhonchi, +wheezing CARDIOVASCULAR: S1 and S2. Regular rate and rhythm. No murmurs, rubs, or gallops.  GASTROINTESTINAL: Soft, nontender, -distended. No masses. Positive bowel sounds. No hepatosplenomegaly.  MUSCULOSKELETAL: No swelling, clubbing, or edema.  NEUROLOGIC: obtunded SKIN:intact,warm,dry     VITALS:  height is 5' 9"  (1.753 m) and weight is 58.4 kg. His axillary temperature is 98.3 F (36.8 C). His blood pressure is 102/65 and his pulse is 96. His respiration is 16 and oxygen saturation is 96%.   I personally reviewed Labs under Results section.  Radiology Reports US Venous Img Upper Uni Right  Result Date: 12/03/2017 CLINICAL DATA:  Right upper extremity pain and edema. Evaluate for DVT. EXAM: RIGHT UPPER EXTREMITY VENOUS DOPPLER ULTRASOUND TECHNIQUE: Gray-scale sonography with graded compression, as well as color Doppler and duplex ultrasound were performed to evaluate the upper extremity deep venous system from the level of the subclavian vein and including the jugular, axillary, basilic, radial, ulnar and upper cephalic vein. Spectral Doppler was utilized to evaluate flow at rest and with distal  augmentation maneuvers. COMPARISON:  Chest radiograph-12/02/2018 FINDINGS: Contralateral Subclavian Vein: Respiratory phasicity is normal and symmetric with the symptomatic side. No evidence of thrombus. Normal compressibility. Internal Jugular Vein: No evidence of thrombus. Normal compressibility, respiratory phasicity and response to augmentation. Subclavian Vein: There is nonocclusive DVT surrounding the right upper extremity approach PICC line at the level of the peripheral aspect of the right subclavian vein (images 10 and 11). Axillary Vein: No evidence of thrombus. Normal compressibility, respiratory phasicity and response to augmentation. Cephalic Vein: There is mixed echogenic occlusive thrombus involving the distal aspect of the cephalic vein (image 18 through 21). The proximal aspect of the cephalic vein appears widely patent (image 16). Basilic Vein: No evidence of thrombus. Normal compressibility, respiratory phasicity and response to augmentation. Brachial Veins: No evidence of thrombus. Normal compressibility, respiratory phasicity and response to augmentation. Radial Veins: No evidence of thrombus though rouleaux flow was demonstrated. Normal compressibility, respiratory phasicity and response to augmentation. Ulnar Veins: No evidence of thrombus. Normal compressibility, respiratory phasicity and response to augmentation. Venous Reflux:  None visualized. Other Findings:  None visualized. IMPRESSION: 1. The examination is positive for nonocclusive DVT surrounding the right upper extremity approach PICC line at level peripheral aspect of the right subclavian vein. 2. Examination is positive for occlusive SVT involving the cephalic vein at the level the mid and distal humerus. Electronically Signed   By: Sandi Mariscal M.D.   On: 12/03/2017 15:03   Dg Chest Port 1 View  Result Date: 12/05/2017 CLINICAL DATA:  Acute onset of respiratory failure. EXAM: PORTABLE CHEST 1 VIEW COMPARISON:  Chest radiograph  performed 12/01/2017 FINDINGS: The patient's endotracheal tube is seen ending 6 cm above the carina. An enteric tube is  noted extending below the diaphragm. A right PICC is noted ending about the distal SVC. Bibasilar airspace opacities, right greater than left, may reflect pneumonia. A small left pleural effusion is noted. No pneumothorax is seen. An apparent calcified granuloma is noted at the right midlung zone. The cardiomediastinal silhouette is normal in size. No acute osseous abnormalities are identified. IMPRESSION: 1. Endotracheal tube seen ending 6 cm above the carina. 2. Bibasilar airspace opacities, right greater than left, may reflect pneumonia. Small left pleural effusion noted. Electronically Signed   By: Garald Balding M.D.   On: 12/05/2017 00:43        Assessment/Plan:  77 yo male with acute encephalopathy with viral syndrome sepsis and pneumonia leading to inability to protect airway leading to severe resp failure  Severe Hypoxic and Hypercapnic Respiratory Failure -continue Full MV support -continue Bronchodilator Therapy -Wean Fio2 and PEEP as tolerated -will perform SAT/SBTTwhen respiratory parameters are met Await for family to arrive    NEUROLOGY - intubated and sedated - minimal sedation to achieve a RASS goal: -1 Follow up neuro recs Continue acyclovir and await HSV PCR  DVT/GI prx TF's at goal   Critical Care Time devoted to patient care services described in this note is 31 minutes.   Overall, patient is critically ill, prognosis is guarded.    Corrin Parker, M.D.  [facility]  Medical Director [place] Director [facility] Cardio-Pulmonary Department

## 2017-12-05 NOTE — Progress Notes (Addendum)
[facility] [place] NAME: Marc [name]    MR#:  [id number]  DATE OF BIRTH:  02/27/1941  SUBJECTIVE:   Patient extubated this morning.  Wife is at bedside. REVIEW OF SYSTEMS:    Unable to obtain patient still drowsy  Tolerating Diet: Tube feeds     DRUG ALLERGIES:   Allergies  Allergen Reactions  . Glimepiride   . Tape Itching and Rash    VITALS:  Blood pressure (!) 146/87, pulse (!) 109, temperature 99.7 F (37.6 C), temperature source Axillary, resp. rate (!) 22, height 5\' 9"  (1.753 m), weight 58.4 kg, SpO2 97 %.  PHYSICAL EXAMINATION:  Constitutional: Appears critically ill-appearing  HEENT: Normocephalic  eyes: Conjunctivae are normal.  neck:. No JVD. No tracheal deviation. CVS: RRR, S1/S2 +, no murmurs, no gallops, no carotid bruit.  Pulmonary: Effort and breath sounds normal, no stridor, rhonchi, wheezes, rales.  Abdominal: Soft. BS +,  no distension, tenderness, rebound or guarding.  Musculoskeletal:  No edema and no tenderness.  Neuro: Awake but does not follow commands  skin: Skin is warm and dry. No rash noted. Psychiatric: Weak and extubated not following commands   LABORATORY PANEL:   CBC Recent Labs  Lab 12/05/17 0334  WBC 6.4  HGB 10.2*  HCT 30.1*  PLT 115*   ------------------------------------------------------------------------------------------------------------------  Chemistries  Recent Labs  Lab 12/02/17 0808  12/04/17 0539 12/05/17 0334  NA 143   < > 145 145  K 3.2*   < > 3.2* 4.0  CL 104   < > 107 107  CO2 30   < > 33* 31  GLUCOSE 140*   < > 162* 150*  BUN 29*   < > 29* 31*  CREATININE 1.31*   < > 0.95 1.20  CALCIUM 8.2*   < > 8.1* 8.2*  MG 1.9   < > 2.0  --   AST 29  --   --   --   ALT 33  --   --   --   ALKPHOS 44  --   --   --   BILITOT 0.7  --   --   --    < > = values in this interval not displayed.    ------------------------------------------------------------------------------------------------------------------  Cardiac Enzymes Recent Labs  Lab 11/29/17 1649  TROPONINI <0.03   ------------------------------------------------------------------------------------------------------------------  RADIOLOGY:  US Venous Img Upper Uni Right  Result Date: 12/03/2017 CLINICAL DATA:  Right upper extremity pain and edema. Evaluate for DVT. EXAM: RIGHT UPPER EXTREMITY VENOUS DOPPLER ULTRASOUND TECHNIQUE: Gray-scale sonography with graded compression, as well as color Doppler and duplex ultrasound were performed to evaluate the upper extremity deep venous system from the level of the subclavian vein and including the jugular, axillary, basilic, radial, ulnar and upper cephalic vein. Spectral Doppler was utilized to evaluate flow at rest and with distal augmentation maneuvers. COMPARISON:  Chest radiograph-12/02/2018 FINDINGS: Contralateral Subclavian Vein: Respiratory phasicity is normal and symmetric with the symptomatic side. No evidence of thrombus. Normal compressibility. Internal Jugular Vein: No evidence of thrombus. Normal compressibility, respiratory phasicity and response to augmentation. Subclavian Vein: There is nonocclusive DVT surrounding the right upper extremity approach PICC line at the level of the peripheral aspect of the right subclavian vein (images 10 and 11). Axillary Vein: No evidence of thrombus. Normal compressibility, respiratory phasicity and response to augmentation. Cephalic Vein: There is mixed echogenic occlusive thrombus involving the distal aspect of the cephalic vein (image 18 through 21). The  proximal aspect of the cephalic vein appears widely patent (image 16). Basilic Vein: No evidence of thrombus. Normal compressibility, respiratory phasicity and response to augmentation. Brachial Veins: No evidence of thrombus. Normal compressibility, respiratory phasicity and response  to augmentation. Radial Veins: No evidence of thrombus though rouleaux flow was demonstrated. Normal compressibility, respiratory phasicity and response to augmentation. Ulnar Veins: No evidence of thrombus. Normal compressibility, respiratory phasicity and response to augmentation. Venous Reflux:  None visualized. Other Findings:  None visualized. IMPRESSION: 1. The examination is positive for nonocclusive DVT surrounding the right upper extremity approach PICC line at level peripheral aspect of the right subclavian vein. 2. Examination is positive for occlusive SVT involving the cephalic vein at the level the mid and distal humerus. Electronically Signed   By: Sandi Mariscal M.D.   On: 12/03/2017 15:03   Dg Chest Port 1 View  Result Date: 12/05/2017 CLINICAL DATA:  Acute onset of respiratory failure. EXAM: PORTABLE CHEST 1 VIEW COMPARISON:  Chest radiograph performed 12/01/2017 FINDINGS: The patient's endotracheal tube is seen ending 6 cm above the carina. An enteric tube is noted extending below the diaphragm. A right PICC is noted ending about the distal SVC. Bibasilar airspace opacities, right greater than left, may reflect pneumonia. A small left pleural effusion is noted. No pneumothorax is seen. An apparent calcified granuloma is noted at the right midlung zone. The cardiomediastinal silhouette is normal in size. No acute osseous abnormalities are identified. IMPRESSION: 1. Endotracheal tube seen ending 6 cm above the carina. 2. Bibasilar airspace opacities, right greater than left, may reflect pneumonia. Small left pleural effusion noted. Electronically Signed   By: Garald Balding M.D.   On: 12/05/2017 00:43     ASSESSMENT AND PLAN:   77 year old male with history of essential hypertension who presented to the emergency room initially due to generalized weakness and weight loss.   1.  Acute hypoxic respiratory failure due to inability to protect airway/responsiveness and patient was intubated by ED  physician.  2.  Hypothermia: This has resolved He is status post LP which does not appear to be bacterial meningitis  3.  Acute encephalopathy of unclear etiology:  CT head without acute pathology MRI brain essentially unremarkable  HSV negative EEG characterized by slowing which is consistent with normal drowsy state.  No epileptic activity is noted. This is not due to meningitis Neurology consultation appreciated.    4.  Weight loss: Work-up outpatient after patient is more stable  5.  Diabetes with A1c of 7.4 Sliding scale  6.  H. pylori gastritis (recent dg): Continue current triple therapy through 8/16   7.  Hypotension after intubation: Weaned off of norepinephrine  8.  Hypokalemia: Replete as needed.  9. nonocclusive DVT surrounding the right upper extremity approach PICC line at level peripheral aspect of the right subclavian vein. and occlusive SVT involving the cephalic vein at the level the mid and distal humerus.: Continue Heparin gtt   CODE STATUS: FULL  TOTAL TIME TAKING CARE OF THIS PATIENT: 21 minutes.   Will need speech and pt consult.   POSSIBLE D/C ??, DEPENDING ON CLINICAL CONDITION.   Mikyla Schachter M.D on 12/05/2017 at 11:13 AM  Between 7am to 6pm - Pager - [phone] After 6pm go to [email or web address] - password [email or web address] [facility]  Office  [phone]  CC: Primary care physician; Tracie Harrier, MD  Note: This dictation was prepared with Dragon dictation along with smaller phrase technology. Any transcriptional errors that result from  this process are unintentional.

## 2017-12-05 NOTE — Evaluation (Signed)
Physical Therapy Evaluation Patient Details Name: Marc [name] MRN: [id number] DOB: 1940-05-10 Today's Date: 12/05/2017   History of Present Illness  77 y.o. male admitted on 11/29/2017 with sepsis no presumed menigitis p/w AMS, lethargy, generalized weakness. 77 y.o. male PMH of HTN, HLD. Intubated 11/30/17 for inability to protect airway/unresponsiveness, extubated 12/05/17, DVT RUE placed on heparin drip.  Clinical Impression  Patient oriented to self at start of session, denies pain. Patient eager to speak, though very halting speech, hoarseness, use of single words. Family at bedside able to confirm PLOF: lives with wife in single level home, 2-5 STE, and was experiencing a change in functional mobility. In the last month the patient has needed more assistance with ADLs, and began ambulating with SPC/RW as needed. Prior to 1 month ago patient was independent.  Upon assessment patient demonstrated bed mobility with modAx1, able to sit EOB ~64minutes with intermittent minAx1 and cues for posture. Patient fatigued and minimally SOB. BP in lying 172/92 prior to mobility, sitting EOB 168/86. Further mobility held due to increased BP and patient symptoms. The patient presents with multiple limitations (see PT problem list below), including generalized weakness, decreased endurance and activity tolerance. The patient would benefit from further skilled PT to address these changes from PLOF and to maximize independence, safety, and mobility.     Follow Up Recommendations SNF    Equipment Recommendations  Other (comment)(TBD at next venue of care, patient has bedside commode, SPC, RW at home)    Recommendations for Other Services       Precautions / Restrictions Precautions Precautions: Fall Restrictions Weight Bearing Restrictions: No      Mobility  Bed Mobility Overal bed mobility: Needs Assistance Bed Mobility: Supine to Sit     Supine to sit: HOB elevated;Mod assist     General bed  mobility comments: Patient eager to initiate movement, too weak/fatigued to perform  Transfers                 General transfer comment: deferred due to elevated BP, fatigue, SOB at EOB  Ambulation/Gait             General Gait Details: deferred due to elevated BP, fatigue, SOB at EOB  Stairs            Wheelchair Mobility    Modified Rankin (Stroke Patients Only)       Balance Overall balance assessment: Needs assistance Sitting-balance support: Feet supported;Bilateral upper extremity supported Sitting balance-Leahy Scale: Poor                                       Pertinent Vitals/Pain Pain Assessment: Faces Faces Pain Scale: Hurts a little bit    Home Living Family/patient expects to be discharged to:: Private residence Living Arrangements: Spouse/significant other Available Help at Discharge: Family Type of Home: House Home Access: Stairs to enter Entrance Stairs-Rails: Can reach both Entrance Stairs-Number of Steps: 2 or 5 Home Layout: One level Home Equipment: Cane - single point;Walker - 2 wheels;Bedside commode;Shower seat      Prior Function Level of Independence: Independent with assistive device(s)         Comments: in the last month, per family, patient has had  a decrease in functional mobility. Uses RW and SPC as needed. Prior to 1 month ago patient independent.      Hand Dominance   Dominant Hand: Right  Extremity/Trunk Assessment   Upper Extremity Assessment Upper Extremity Assessment: Generalized weakness;RUE deficits/detail;LUE deficits/detail RUE Deficits / Details: 3/5 LUE Deficits / Details: 3/5    Lower Extremity Assessment Lower Extremity Assessment: Generalized weakness;RLE deficits/detail;LLE deficits/detail RLE Deficits / Details: 2+/5 grossly, ankle PF/DF 3/5 LLE Deficits / Details: 2+/5 grossly, Ankle PF/DF 3/5       Communication   Communication: Other (comment)(Extubated, fatigued,  able to attempt to verbalize, hoarse)  Cognition Arousal/Alertness: Awake/alert Behavior During Therapy: WFL for tasks assessed/performed Overall Cognitive Status: Within Functional Limits for tasks assessed                                        General Comments      Exercises General Exercises - Lower Extremity Ankle Circles/Pumps: AROM;10 reps;Both Heel Slides: AAROM;Both;10 reps Hip ABduction/ADduction: AAROM;Both;10 reps   Assessment/Plan    PT Assessment Patient needs continued PT services  PT Problem List Decreased strength;Decreased range of motion;Decreased knowledge of use of DME;Decreased activity tolerance;Decreased safety awareness;Decreased balance;Decreased mobility       PT Treatment Interventions DME instruction;Balance training;Gait training;Neuromuscular re-education;Stair training;Functional mobility training;Patient/family education;Therapeutic activities;Therapeutic exercise    PT Goals (Current goals can be found in the Care Plan section)  Acute Rehab PT Goals Patient Stated Goal: Patient wants to get up PT Goal Formulation: With patient Time For Goal Achievement: 12/19/17 Potential to Achieve Goals: Good    Frequency Min 2X/week   Barriers to discharge        Co-evaluation               AM-PAC PT "6 Clicks" Daily Activity  Outcome Measure Difficulty turning over in bed (including adjusting bedclothes, sheets and blankets)?: A Lot Difficulty moving from lying on back to sitting on the side of the bed? : Unable Difficulty sitting down on and standing up from a chair with arms (e.g., wheelchair, bedside commode, etc,.)?: Unable Help needed moving to and from a bed to chair (including a wheelchair)?: Total Help needed walking in hospital room?: Total Help needed climbing 3-5 steps with a railing? : Total 6 Click Score: 7    End of Session Equipment Utilized During Treatment: Oxygen Activity Tolerance: Patient limited by  fatigue Patient left: in bed;with nursing/sitter in room;with family/visitor present;with call bell/phone within reach;with bed alarm set Nurse Communication: Mobility status PT Visit Diagnosis: Unsteadiness on feet (R26.81);Difficulty in walking, not elsewhere classified (R26.2);Other abnormalities of gait and mobility (R26.89);Muscle weakness (generalized) (M62.81)    Time: 8469-6295 PT Time Calculation (min) (ACUTE ONLY): 39 min   Charges:   PT Evaluation $PT Eval Moderate Complexity: 1 Mod PT Treatments $Therapeutic Exercise: 8-22 mins $Therapeutic Activity: 8-22 mins        Lieutenant Diego PT, DPT 3:08 PM,12/05/17 [phone]

## 2017-12-05 NOTE — Progress Notes (Signed)
[facility] for heparin drip management  Indication: DVT  Patient Measurements: Height: 5\' 9"  (175.3 cm) Weight: 128 lb 12 oz (58.4 kg) IBW/kg (Calculated) : 70.7   Vital Signs: Temp: 97.9 F (36.6 C) (08/08 1140) Temp Source: Axillary (08/08 1140) BP: 153/87 (08/08 1400) Pulse Rate: 108 (08/08 1400)  Labs: Recent Labs    12/03/17 0403 12/03/17 1611  12/04/17 0539 12/05/17 0334 12/05/17 1325  HGB  --   --   --  11.4* 10.2*  --   HCT  --   --   --  33.8* 30.1*  --   PLT  --   --   --  93* 115*  --   APTT  --  38*  --   --   --   --   LABPROT  --  15.8*  --   --   --   --   INR  --  1.27  --   --   --   --   HEPARINUNFRC  --   --    < > 0.38 0.29* 0.47  CREATININE 1.22  --   --  0.95 1.20  --    < > = values in this interval not displayed.    Estimated Creatinine Clearance: 43.9 mL/min (by C-G formula based on SCr of 1.2 mg/dL).  Medical History: Past Medical History:  Diagnosis Date  . Anxiety   . BPH (benign prostatic hyperplasia)   . Colon adenomas    history of  . Diverticulosis   . Hyperlipidemia   . Hypertension    Assessment: Pharmacy consulted for heparin drip management for 77 yo male admitted with AMS and being treated for DVT of right upper extremety. Patient remains sedated and requiring mechanical ventilation. 8/8 AM heparin level 0.29. Patient was given 900 unit bolus and rate was increased to 1100 units/hr.   Goal of Therapy:  Heparin level 0.3-0.7 units/ml Monitor platelets by anticoagulation protocol: Yes   Plan:  08/08 1430 HL therapeutic @ 0.47. Will continue with current heparin rate of 1100 units/hr. Will recheck confirmatory level in 8 hours. CBC with AM labs per protocol.   Pharmacy will continue to monitor and adjust per consult.   Pernell Dupre, PharmD, BCPS Clinical Pharmacist 12/05/2017 2:39 PM

## 2017-12-05 NOTE — Progress Notes (Signed)
Decreased FIO2 to 30%

## 2017-12-05 NOTE — Progress Notes (Signed)
Patients tidal volumes on +5 PSV only in the low 300's, increased to 10PSV and tidal volumes increased to the low to mid 400'S. Saturations adequate with sats of 95% on 30%. Will continue to monitor.

## 2017-12-05 NOTE — Progress Notes (Signed)
Patient extubated to 2l [place], sats 97%. No complications noted, will continue to monitor.

## 2017-12-05 NOTE — Progress Notes (Signed)
Subjective: Patient now awake and extubated.  Objective: Current vital signs: BP (!) 156/87   Pulse 94   Temp 98 F (36.7 C) (Axillary)   Resp (!) 24   Ht 5\' 9"  (1.753 m)   Wt 58.4 kg   SpO2 99%   BMI 19.01 kg/m  Vital signs in last 24 hours: Temp:  [97.9 F (36.6 C)-99.7 F (37.6 C)] 98 F (36.7 C) (08/08 1600) Pulse Rate:  [89-122] 94 (08/08 1800) Resp:  [16-27] 24 (08/08 1800) BP: (74-192)/(48-116) 156/87 (08/08 1800) SpO2:  [87 %-100 %] 99 % (08/08 1800) FiO2 (%):  [30 %-60 %] 30 % (08/08 0940) Weight:  [58.4 kg] 58.4 kg (08/08 0500)  Intake/Output from previous day: 08/07 0701 - 08/08 0700 In: 2467 [I.V.:699; NG/GT:1320; IV Piggyback:448] Out: 1400 [Urine:1400] Intake/Output this shift: Total I/O In: 530.2 [I.V.:228.1; NG/GT:171.4; IV Piggyback:130.7] Out: 575 [Urine:575] Nutritional status:  Diet Order            Diet NPO time specified  Diet effective now              Neurologic Exam: Mental Status: Alert.  Due to painful throat not talking.  Follows commands.   Cranial Nerves: II: Discs flat bilaterally; Visual fields grossly normal, pupils equal, round, reactive to light and accommodation III,IV, VI: ptosis not present, extra-ocular motions intact bilaterally V,VII: smile symmetric, facial light touch sensation normal bilaterally VIII: hearing normal bilaterally IX,X: gag reflex present XI: bilateral shoulder shrug XII: midline tongue extension Motor: Lifts all extremities against gravity with no focal weakness noted Sensory: Pinprick and light touch intact throughout, bilaterally   Lab Results: Basic Metabolic Panel: Recent Labs  Lab 12/01/17 0520 12/01/17 1728 12/02/17 0808 12/03/17 0403 12/04/17 0539 12/05/17 0334  NA 137 144 143 144 145 145  K 2.6* 3.5 3.2* 3.9 3.2* 4.0  CL 99 107 104 108 107 107  CO2 28 32 30 31 33* 31  GLUCOSE 166* 197* 140* 215* 162* 150*  BUN 34* 35* 29* 29* 29* 31*  CREATININE 1.40* 1.37* 1.31* 1.22 0.95  1.20  CALCIUM 7.5* 8.6* 8.2* 8.4* 8.1* 8.2*  MG 1.6* 2.2 1.9 2.3 2.0  --   PHOS 2.0* 2.8 3.7 3.6 2.9  --     Liver Function Tests: Recent Labs  Lab 11/29/17 1649 12/02/17 0808  AST 22 29  ALT 30 33  ALKPHOS 71 44  BILITOT 0.7 0.7  PROT 7.0 4.9*  ALBUMIN 4.5 2.6*   No results for input(s): LIPASE, AMYLASE in the last 168 hours. Recent Labs  Lab 11/30/17 0323  AMMONIA 39*    CBC: Recent Labs  Lab 11/29/17 1649 12/01/17 0520 12/02/17 0808 12/04/17 0539 12/05/17 0334  WBC 11.5* 13.1* 8.0 6.6 6.4  NEUTROABS  --  9.8* 6.5  --   --   HGB 15.7 12.3* 12.8* 11.4* 10.2*  HCT 46.1 35.0* 38.2* 33.8* 30.1*  MCV 95.9 96.9 95.7 96.9 96.5  PLT 131* 118* 103* 93* 115*    Cardiac Enzymes: Recent Labs  Lab 11/29/17 1649  TROPONINI <0.03    Lipid Panel: Recent Labs  Lab 11/30/17 1902 12/03/17 1704  TRIG 141 108    CBG: Recent Labs  Lab 12/04/17 2315 12/05/17 0403 12/05/17 0719 12/05/17 1133 12/05/17 1536  GLUCAP 212* 135* 177* 110* 149*    Microbiology: Results for orders placed or performed during the hospital encounter of 11/29/17  Urine culture     Status: Abnormal   Collection Time: 11/29/17 11:32 PM  Result Value Ref Range Status   Specimen Description   Final    URINE, RANDOM Performed at [facility], [address], [place], [place] [address]    Special Requests   Final    NONE Performed at [facility], [address] [place], [place] [address]    Culture (A)  Final    <10,000 COLONIES/mL INSIGNIFICANT GROWTH Performed at [place] [address] [place], [place] [address]    Report Status 12/01/2017 FINAL  Final  Blood Culture (routine x 2)     Status: None   Collection Time: 11/29/17 11:32 PM  Result Value Ref Range Status   Specimen Description BLOOD LEFT ANTECUBITAL  Final   Special Requests   Final    BOTTLES DRAWN AEROBIC AND ANAEROBIC Blood Culture adequate volume   Culture   Final    NO GROWTH 5  DAYS Performed at [name], [address] [place], [place] [address]    Report Status 12/04/2017 FINAL  Final  Blood Culture (routine x 2)     Status: None   Collection Time: 11/29/17 11:32 PM  Result Value Ref Range Status   Specimen Description BLOOD RIGHT ANTECUBITAL  Final   Special Requests   Final    BOTTLES DRAWN AEROBIC AND ANAEROBIC Blood Culture adequate volume   Culture   Final    NO GROWTH 5 DAYS Performed at [facility], [address] [place], [place] [address]    Report Status 12/04/2017 FINAL  Final  MRSA PCR Screening     Status: None   Collection Time: 11/30/17 11:59 AM  Result Value Ref Range Status   MRSA by PCR NEGATIVE NEGATIVE Final    Comment:        The GeneXpert MRSA Assay (FDA approved for NASAL specimens only), is one component of a comprehensive MRSA colonization surveillance program. It is not intended to diagnose MRSA infection nor to guide or monitor treatment for MRSA infections. Performed at [facility], [address] [place], [place] [address]   Culture, blood (routine x 2)     Status: None (Preliminary result)   Collection Time: 12/02/17  8:08 AM  Result Value Ref Range Status   Specimen Description BLOOD BLOOD RIGHT HAND  Final   Special Requests   Final    BOTTLES DRAWN AEROBIC AND ANAEROBIC Blood Culture adequate volume   Culture   Final    NO GROWTH 3 DAYS Performed at [facility], [address] [place], [place] [address]    Report Status PENDING  Incomplete  Culture, blood (routine x 2)     Status: None (Preliminary result)   Collection Time: 12/02/17  8:08 AM  Result Value Ref Range Status   Specimen Description BLOOD BLOOD RIGHT WRIST  Final   Special Requests   Final    BOTTLES DRAWN AEROBIC AND ANAEROBIC Blood Culture results may not be optimal due to an inadequate volume of blood received in culture bottles   Culture   Final    NO GROWTH 3 DAYS Performed at [facility], [address], [place], [place] [address]    Report Status PENDING  Incomplete  CSF culture     Status: None   Collection Time: 12/02/17  9:40 AM  Result Value Ref Range Status   Specimen Description   Final    CSF Performed at [facility], [place] [address] [place], [place] [address]  Special Requests   Final    NONE Performed at [facility], [place], [place] [address]    Gram Stain   Final    RARE WBC SEEN MODERATE RED BLOOD CELLS NO ORGANISMS SEEN    Culture   Final    NO GROWTH 3 DAYS Performed at [facility], [place] [address], [place], [place] [address]    Report Status 12/05/2017 FINAL  Final  Culture, fungus without smear     Status: None (Preliminary result)   Collection Time: 12/02/17  9:40 AM  Result Value Ref Range Status   Specimen Description   Final    CSF Performed at [facility], [address], [place], [place] [address]    Special Requests   Final    NONE Performed at [facility], [address], [place], [place] [address]    Culture   Final    NO FUNGUS ISOLATED AFTER 3 DAYS Performed at [facility], [place] [address], [place],  [address]    Report Status PENDING  Incomplete    Coagulation Studies: Recent Labs    12/03/17 1611  LABPROT 15.8*  INR 1.27    Imaging: Dg Chest Port 1 View  Result Date: 12/05/2017 CLINICAL DATA:  Acute onset of respiratory failure. EXAM: PORTABLE CHEST 1 VIEW COMPARISON:  Chest radiograph performed 12/01/2017 FINDINGS: The patient's endotracheal tube is seen ending 6 cm above the carina. An enteric tube is noted extending below the diaphragm. A right PICC is noted ending about the distal SVC. Bibasilar airspace opacities, right greater than left, may reflect pneumonia. A small left pleural effusion is noted. No pneumothorax is seen. An apparent calcified granuloma is noted at the right midlung zone. The cardiomediastinal silhouette  is normal in size. No acute osseous abnormalities are identified. IMPRESSION: 1. Endotracheal tube seen ending 6 cm above the carina. 2. Bibasilar airspace opacities, right greater than left, may reflect pneumonia. Small left pleural effusion noted. Electronically Signed   By: Garald Balding M.D.   On: 12/05/2017 00:43    Medications:  I have reviewed the patient's current medications. Scheduled: . amoxicillin  1,000 mg Oral Q12H  . budesonide (PULMICORT) nebulizer solution  0.5 mg Nebulization BID  . clarithromycin  500 mg Oral Q12H  . insulin aspart  0-9 Units Subcutaneous Q4H  . insulin aspart  5 Units Subcutaneous Q4H  . ipratropium-albuterol  3 mL Nebulization Q4H  . multivitamin with minerals  1 tablet Oral Daily  . pantoprazole  40 mg Oral BID  . senna-docusate  1 tablet Oral BID  . sodium chloride flush  10-40 mL Intracatheter Q12H    Assessment/Plan: Patient improved.  Awake, alert and following commands.  No evidence of CNS structural abnormalities or infection.  EEG unremarkable.  No further neurologic intervention is recommended at this time.  If further questions arise, please call or page at that time.  Thank you for allowing neurology to participate in the care of this patient.    LOS: 6 days   Alexis Goodell, MD Neurology [phone] 12/05/2017  6:12 PM

## 2017-12-06 ENCOUNTER — Inpatient Hospital Stay: Payer: PPO

## 2017-12-06 LAB — COMPREHENSIVE METABOLIC PANEL
ALBUMIN: 2.7 g/dL — AB (ref 3.5–5.0)
ALK PHOS: 68 U/L (ref 38–126)
ALT: 31 U/L (ref 0–44)
AST: 40 U/L (ref 15–41)
Anion gap: 9 (ref 5–15)
BILIRUBIN TOTAL: 0.7 mg/dL (ref 0.3–1.2)
BUN: 46 mg/dL — AB (ref 8–23)
CO2: 33 mmol/L — ABNORMAL HIGH (ref 22–32)
CREATININE: 1.56 mg/dL — AB (ref 0.61–1.24)
Calcium: 8.9 mg/dL (ref 8.9–10.3)
Chloride: 106 mmol/L (ref 98–111)
GFR calc Af Amer: 48 mL/min — ABNORMAL LOW (ref >60)
GFR calc non Af Amer: 42 mL/min — ABNORMAL LOW (ref >60)
GLUCOSE: 253 mg/dL — AB (ref 70–99)
POTASSIUM: 4 mmol/L (ref 3.5–5.1)
Sodium: 148 mmol/L — ABNORMAL HIGH (ref 135–145)
Total Protein: 5.9 g/dL — ABNORMAL LOW (ref 6.5–8.1)

## 2017-12-06 LAB — BLOOD GAS, ARTERIAL
ACID-BASE EXCESS: 6.6 mmol/L — AB (ref 0.0–2.0)
BICARBONATE: 31.9 mmol/L — AB (ref 20.0–28.0)
Delivery systems: POSITIVE
Expiratory PAP: 5
FIO2: 0.4
Inspiratory PAP: 15
O2 SAT: 96.4 %
PATIENT TEMPERATURE: 37
PCO2 ART: 47 mmHg (ref 32.0–48.0)
PH ART: 7.44 (ref 7.350–7.450)
PO2 ART: 82 mmHg — AB (ref 83.0–108.0)
RATE: 8 resp/min

## 2017-12-06 LAB — MAGNESIUM
MAGNESIUM: 2 mg/dL (ref 1.7–2.4)
Magnesium: 1.9 mg/dL (ref 1.7–2.4)

## 2017-12-06 LAB — BASIC METABOLIC PANEL
Anion gap: 13 (ref 5–15)
BUN: 45 mg/dL — AB (ref 8–23)
CO2: 29 mmol/L (ref 22–32)
Calcium: 9.2 mg/dL (ref 8.9–10.3)
Chloride: 107 mmol/L (ref 98–111)
Creatinine, Ser: 1.8 mg/dL — ABNORMAL HIGH (ref 0.61–1.24)
GFR calc Af Amer: 41 mL/min — ABNORMAL LOW (ref >60)
GFR, EST NON AFRICAN AMERICAN: 35 mL/min — AB (ref >60)
Glucose, Bld: 163 mg/dL — ABNORMAL HIGH (ref 70–99)
POTASSIUM: 4.5 mmol/L (ref 3.5–5.1)
SODIUM: 149 mmol/L — AB (ref 135–145)

## 2017-12-06 LAB — GLUCOSE, CAPILLARY
GLUCOSE-CAPILLARY: 134 mg/dL — AB (ref 70–99)
GLUCOSE-CAPILLARY: 140 mg/dL — AB (ref 70–99)
Glucose-Capillary: 100 mg/dL — ABNORMAL HIGH (ref 70–99)
Glucose-Capillary: 144 mg/dL — ABNORMAL HIGH (ref 70–99)
Glucose-Capillary: 189 mg/dL — ABNORMAL HIGH (ref 70–99)
Glucose-Capillary: 255 mg/dL — ABNORMAL HIGH (ref 70–99)

## 2017-12-06 LAB — PHOSPHORUS: Phosphorus: 4.2 mg/dL (ref 2.5–4.6)

## 2017-12-06 LAB — CBC WITH DIFFERENTIAL/PLATELET
Basophils Absolute: 0 10*3/uL (ref 0–0.1)
Basophils Relative: 0 %
EOS PCT: 1 %
Eosinophils Absolute: 0 10*3/uL (ref 0–0.7)
HEMATOCRIT: 34 % — AB (ref 40.0–52.0)
Hemoglobin: 11.6 g/dL — ABNORMAL LOW (ref 13.0–18.0)
LYMPHS ABS: 0.6 10*3/uL — AB (ref 1.0–3.6)
LYMPHS PCT: 10 %
MCH: 33.2 pg (ref 26.0–34.0)
MCHC: 34 g/dL (ref 32.0–36.0)
MCV: 97.7 fL (ref 80.0–100.0)
MONO ABS: 0.8 10*3/uL (ref 0.2–1.0)
Monocytes Relative: 12 %
NEUTROS ABS: 4.8 10*3/uL (ref 1.4–6.5)
Neutrophils Relative %: 77 %
PLATELETS: 128 10*3/uL — AB (ref 150–440)
RBC: 3.49 MIL/uL — ABNORMAL LOW (ref 4.40–5.90)
RDW: 13.2 % (ref 11.5–14.5)
WBC: 6.2 10*3/uL (ref 3.8–10.6)

## 2017-12-06 LAB — AMMONIA: Ammonia: 13 umol/L (ref 9–35)

## 2017-12-06 LAB — LACTIC ACID, PLASMA: Lactic Acid, Venous: 1.8 mmol/L (ref 0.5–1.9)

## 2017-12-06 LAB — HEPARIN LEVEL (UNFRACTIONATED): Heparin Unfractionated: 0.43 IU/mL (ref 0.30–0.70)

## 2017-12-06 LAB — TROPONIN I: TROPONIN I: 0.69 ng/mL — AB (ref <0.03)

## 2017-12-06 MED ORDER — AMLODIPINE BESYLATE 5 MG PO TABS
5.0000 mg | ORAL_TABLET | Freq: Every day | ORAL | Status: DC
  Administered 2017-12-06: 5 mg via ORAL
  Filled 2017-12-06: qty 1

## 2017-12-06 MED ORDER — BLISTEX MEDICATED EX OINT
TOPICAL_OINTMENT | CUTANEOUS | Status: DC | PRN
Start: 2017-12-06 — End: 2017-12-28
  Filled 2017-12-06: qty 6.3

## 2017-12-06 MED ORDER — AMOXICILLIN 250 MG/5ML PO SUSR
1000.0000 mg | Freq: Two times a day (BID) | ORAL | Status: DC
  Filled 2017-12-06: qty 20

## 2017-12-06 MED ORDER — DOCUSATE SODIUM 50 MG/5ML PO LIQD
100.0000 mg | Freq: Two times a day (BID) | ORAL | Status: DC
Start: 2017-12-06 — End: 2017-12-28
  Administered 2017-12-07 – 2017-12-26 (×20): 100 mg
  Filled 2017-12-06 (×23): qty 10

## 2017-12-06 MED ORDER — CARVEDILOL 25 MG PO TABS
25.0000 mg | ORAL_TABLET | Freq: Two times a day (BID) | ORAL | Status: DC
  Administered 2017-12-07 – 2017-12-08 (×2): 25 mg via ORAL
  Filled 2017-12-06 (×2): qty 1

## 2017-12-06 MED ORDER — PRO-STAT SUGAR FREE PO LIQD
30.0000 mL | Freq: Every day | ORAL | Status: DC
Start: 2017-12-06 — End: 2017-12-17
  Administered 2017-12-06 – 2017-12-16 (×11): 30 mL

## 2017-12-06 MED ORDER — LABETALOL HCL 5 MG/ML IV SOLN: 10.0000 mg | Freq: Once | INTRAVENOUS | Status: DC

## 2017-12-06 MED ORDER — ADULT MULTIVITAMIN LIQUID CH
15.0000 mL | Freq: Every day | ORAL | Status: DC
Start: 2017-12-06 — End: 2017-12-10
  Administered 2017-12-06 – 2017-12-10 (×5): 15 mL
  Filled 2017-12-06 (×5): qty 15

## 2017-12-06 MED ORDER — IPRATROPIUM-ALBUTEROL 0.5-2.5 (3) MG/3ML IN SOLN
3.0000 mL | Freq: Four times a day (QID) | RESPIRATORY_TRACT | Status: DC
  Administered 2017-12-06 – 2017-12-09 (×12): 3 mL via RESPIRATORY_TRACT
  Filled 2017-12-06 (×12): qty 3

## 2017-12-06 MED ORDER — METOPROLOL TARTRATE 5 MG/5ML IV SOLN: 5.0000 mg | Freq: Once | INTRAVENOUS | Status: DC

## 2017-12-06 MED ORDER — LABETALOL HCL 5 MG/ML IV SOLN
15.0000 mg | Freq: Four times a day (QID) | INTRAVENOUS | Status: DC | PRN
  Administered 2017-12-06 – 2017-12-07 (×2): 15 mg via INTRAVENOUS
  Filled 2017-12-06 (×2): qty 4

## 2017-12-06 MED ORDER — METOPROLOL TARTRATE 5 MG/5ML IV SOLN
5.0000 mg | Freq: Three times a day (TID) | INTRAVENOUS | Status: DC | PRN
  Administered 2017-12-06: 5 mg via INTRAVENOUS
  Filled 2017-12-06: qty 5

## 2017-12-06 MED ORDER — NICARDIPINE HCL IN NACL 20-0.86 MG/200ML-% IV SOLN
0.0000 mg/h | INTRAVENOUS | Status: DC
  Administered 2017-12-06 – 2017-12-07 (×2): 5 mg/h via INTRAVENOUS
  Administered 2017-12-07 (×3): 12.5 mg/h via INTRAVENOUS
  Filled 2017-12-06 (×7): qty 200

## 2017-12-06 MED ORDER — CARVEDILOL 12.5 MG PO TABS: 12.5000 mg | ORAL_TABLET | Freq: Two times a day (BID) | ORAL | Status: DC

## 2017-12-06 MED ORDER — SENNOSIDES-DOCUSATE SODIUM 8.6-50 MG PO TABS: 1.0000 | ORAL_TABLET | Freq: Two times a day (BID) | ORAL | Status: DC

## 2017-12-06 MED ORDER — CLARITHROMYCIN 500 MG PO TABS
500.0000 mg | ORAL_TABLET | Freq: Two times a day (BID) | ORAL | Status: DC
  Administered 2017-12-06 – 2017-12-08 (×6): 500 mg
  Filled 2017-12-06 (×7): qty 1

## 2017-12-06 MED ORDER — FREE WATER
200.0000 mL | Freq: Three times a day (TID) | Status: DC
  Administered 2017-12-06 – 2017-12-07 (×2): 200 mL

## 2017-12-06 MED ORDER — VITAL 1.5 CAL PO LIQD: 1000.0000 mL | ORAL | Status: DC

## 2017-12-06 MED ORDER — AMOXICILLIN 250 MG/5ML PO SUSR
1000.0000 mg | Freq: Two times a day (BID) | ORAL | Status: DC
  Administered 2017-12-06 – 2017-12-08 (×6): 1000 mg
  Filled 2017-12-06 (×9): qty 20

## 2017-12-06 MED ORDER — HYDRALAZINE HCL 20 MG/ML IJ SOLN
10.0000 mg | Freq: Four times a day (QID) | INTRAMUSCULAR | Status: DC | PRN
  Filled 2017-12-06: qty 1

## 2017-12-06 MED ORDER — VITAL 1.5 CAL PO LIQD
1000.0000 mL | ORAL | Status: DC
  Administered 2017-12-06 – 2017-12-10 (×7): 1000 mL

## 2017-12-06 MED ORDER — LIP MEDEX EX OINT
1.0000 "application " | TOPICAL_OINTMENT | CUTANEOUS | Status: DC | PRN
  Filled 2017-12-06: qty 7

## 2017-12-06 MED ORDER — MELATONIN 5 MG PO TABS
5.0000 mg | ORAL_TABLET | Freq: Every day | ORAL | Status: DC
  Administered 2017-12-07 – 2017-12-08 (×3): 5 mg
  Filled 2017-12-06 (×3): qty 1

## 2017-12-06 MED ORDER — CLARITHROMYCIN 500 MG PO TABS
500.0000 mg | ORAL_TABLET | Freq: Two times a day (BID) | ORAL | Status: DC
  Filled 2017-12-06: qty 1

## 2017-12-06 MED ORDER — PANTOPRAZOLE SODIUM 40 MG PO PACK
40.0000 mg | PACK | Freq: Two times a day (BID) | ORAL | Status: DC
Start: 2017-12-06 — End: 2017-12-09
  Administered 2017-12-06 – 2017-12-08 (×6): 40 mg
  Filled 2017-12-06 (×6): qty 20

## 2017-12-06 MED ORDER — CLONAZEPAM 0.125 MG PO TBDP: 0.2500 mg | ORAL_TABLET | Freq: Two times a day (BID) | ORAL | Status: DC

## 2017-12-06 NOTE — Progress Notes (Signed)
Patients BP improved after PRN Labetalol given. Hold Cardene gtt for now per Burman Nieves, NP. Start gtt if SBP >130.

## 2017-12-06 NOTE — Progress Notes (Signed)
CRITICAL CARE NOTE  CC  Weakness and dizziness with severe respiratory failure  SUBJECTIVE  77 year old male with acute encephalopathy secondary to viral syndrome with sepsis and pneumonia. Patient was admitted on 8/2 and intubated on 8/3 for respiratory failure. He has a history of barrett's esophagus and has been receiving treatment for h. Pylori. Patient was extubated yesterday, 8/8, and has been doing well. Today, patient is on room air and has O2 sat of 100%. He is progressively improving and is able to speak to me today. He states he would like some chapstick and is ready to eat. He tells me his bottom is sore.   SIGNIFICANT EVENTS Patient evaluated by speech today and failed swallow study. DobHoff tube placed for nutrition and medications.    BP (!) 139/99   Pulse 77   Temp 97.6 F (36.4 C) (Oral)   Resp (!) 31   Ht 5\' 9"  (1.753 m)   Wt 62 kg   SpO2 100%   BMI 20.18 kg/m    REVIEW OF SYSTEMS  PATIENT IS UNABLE TO PROVIDE COMPLETE REVIEW OF SYSTEM S DUE TO  ENCEPHALOPATHY   PHYSICAL EXAMINATION:  GENERAL: thin, elderly male who appears his stated age.  HEAD: Normocephalic, atraumatic.  EYES: Pupils equal, round, reactive to light.  No scleral icterus.  MOUTH: Moist mucosal membrane., dobhoff tube taped to nose.  NECK: Supple. No thyromegaly. No nodules. No JVD.  PULMONARY: clear to auscultation bilaterally, no increased work of breathing noted. CARDIOVASCULAR: S1 and S2. Regular rate and rhythm. No murmurs, rubs, or gallops. GASTROINTESTINAL: Soft, nontender, -distended. No masses. Diminished bowel sounds. No hepatosplenomegaly.  MUSCULOSKELETAL: No swelling, clubbing, or edema.  NEUROLOGIC: alert but drowsy, able to answer questions appropriately SKIN:intact,warm,dry  ASSESSMENT AND PLAN SYNOPSIS  77 year old male who presented to the emergency department on 8/2 for generalized weakness with weight loss. Patient was intubated on 8/3 secondary to sepsis and  pneumonia. He was extubated on 8/8 without complication. He is slowly starting to speak and is progressing appropriately. PT/OT working with patient.   Patient having difficulty sleeping. Precedex started last night secondary to agitation and insomnia. Will start melatonin at night, 5mg .  Transfer orders placed for transfer to general medicine floor.   Severe Hypoxic and Hypercapnic Respiratory Failure with sepsis and pneumonia -resolved, extubated 8/8 -patient on RA and O2 sat of 100%, lungs sound clear  Endocrinology -diabetes mellitus -continue SSI -monitor CBG  Gastrointestinal: -H.pylori: continue triple therapy (clarithromycin, amoxicillin, PPI) -tube feeds until cleared by speech -appreciate speech consult -dobhoff tube placed: AXR shows tube terminating in distal stomach, recommend advancing  Renal: -Hypernatremia; 145-->149 -creatinine trending up: 1.2-->1.8 -will add free water to tube feeds (267mL q8h) -repeat BMP tomorrow  Neurology  -acute encephalopathy secondary to viral syndrome -LP negative -CT head/MRI brain negative for acute abnormality -EEG without epileptiform activity  -neurology has signed off, appreciate consultation  Cardiac -right subclavian vein DVT that is non-occlusive with occlusive SVT of cephalic vein -continue heparin drip  ID -leukocytosis has resolved (6.2) -patient remains afebrile -HSV negative--> acyclovir discontinued   DVT: heparin GI PRX: protonix  TRANSFUSIONS AS NEEDED MONITOR FSBS ASSESS the need for LABS as needed   Critical Care Time devoted to patient care services described in this note is 28 minutes.   Overall, patient is critically ill, prognosis is guarded.  Patient with Multiorgan failure and at high risk for cardiac arrest and death.

## 2017-12-06 NOTE — Progress Notes (Signed)
Charge RN notified Maggie, NP of critical troponin 0.69 via text. Awaiting response.

## 2017-12-06 NOTE — Evaluation (Signed)
Clinical/Bedside Swallow Evaluation Patient Details  Name: Marc [name] MRN: [id number] Date of Birth: June 20, 1940  Today's Date: 12/06/2017 Time: SLP Start Time (ACUTE ONLY): 0840 SLP Stop Time (ACUTE ONLY): 0915 SLP Time Calculation (min) (ACUTE ONLY): 35 min  Past Medical History:  Past Medical History:  Diagnosis Date  . Anxiety   . BPH (benign prostatic hyperplasia)   . Colon adenomas    history of  . Diverticulosis   . Hyperlipidemia   . Hypertension    Past Surgical History:  Past Surgical History:  Procedure Laterality Date  . CHOLECYSTECTOMY    . COLONOSCOPY WITH PROPOFOL N/A 01/23/2016   Procedure: COLONOSCOPY WITH PROPOFOL;  Surgeon: Manya Silvas, MD;  Location[facility] ENDOSCOPY;  Service: Endoscopy;  Laterality: N/A;  . ESOPHAGOGASTRODUODENOSCOPY (EGD) WITH PROPOFOL N/A 11/27/2017   Procedure: ESOPHAGOGASTRODUODENOSCOPY (EGD) WITH PROPOFOL;  Surgeon: Manya Silvas, MD;  Location: [facility] ENDOSCOPY;  Service: Endoscopy;  Laterality: N/A;   HPI:  Per admitting H&P: Marc [name]  is a 77 y.o. male with a known history of HTN, HLD, BPH p/w AMS, lethargy, generalized weakness. Pt is not verbally responsive at the time of my assessment. His eyes are closed. He opens his eyes transiently when you call his first name loudly, but closes them again. He is restless/agitated, but his movements are purposeful (interfering with treatment/devices). He has pulled out multiple IVs, and has attempted to grab his Foley catheter. He is not decorticating/decerebrating. He appears to be protecting his airway. He is taking rapid shallow breaths. He does not appear have any obvious gross focal neurological deficits with specific regards to motor strength and cranial nerve function. He has fairly impressive muscle strength. He does not follow instructions. He is non-tachycardic but hypothermic. His lung sounds are diminished due to poor respiratory effort. He does not appear to be in distress.    Assessment / Plan / Recommendation Clinical Impression  Patient presents with severe oropharyngeal dysphagia. Oral phase characterized by severe bilateral lingual and labial weakness and coordination difficulties. Pharyngeal phase c/b severe delayed swallow initiation and presented with s/s aspiration and/or laryngeal penetration with small tsp ice chips.  No swallow initiated with tsp of ice chips. Noted delayed throat clear. Patient held tsp puree on tongue, and made small attempts to manipulate bolus, however unable to propel bolus posteriorly. SLP retrieved tsp puree from oral cavity and provided oral care. Patient recently extubated yesterday. Patient was very alert and requesting ice and food. Difficult to elicit volitional swallow, laryngeal elevation appeared markedly reduced. Noted wet vocal quality prior to trial PO's. Wife reports patient has recent history of unexplained significant weight loss which they have been investigating over the past 4 months. Patient previously reported to be tolerating a regular diet with thin liquids with no s/s aspiration. Noted recent diagnosis of Barrett's esophagus. Recommend continue with NPO for now, may consider temporary alternative means of nutrition.  SLP to follow up and re-evaluate swallow function in a.m. if appropriate. SLP Visit Diagnosis: Dysphagia, unspecified (R13.10)    Aspiration Risk  Severe aspiration risk    Diet Recommendation NPO        Other  Recommendations Oral Care Recommendations: Oral care QID   Follow up Recommendations        Frequency and Duration min 3x week  2 weeks       Prognosis Prognosis for Safe Diet Advancement: Fair      Swallow Study   General Date of Onset: 12/06/17 HPI: Per admitting H&P:  Marc [name]  is a 77 y.o. male with a known history of HTN, HLD, BPH p/w AMS, lethargy, generalized weakness. Pt is not verbally responsive at the time of my assessment. His eyes are closed. He opens his eyes  transiently when you call his first name loudly, but closes them again. He is restless/agitated, but his movements are purposeful (interfering with treatment/devices). He has pulled out multiple IVs, and has attempted to grab his Foley catheter. He is not decorticating/decerebrating. He appears to be protecting his airway. He is taking rapid shallow breaths. He does not appear have any obvious gross focal neurological deficits with specific regards to motor strength and cranial nerve function. He has fairly impressive muscle strength. He does not follow instructions. He is non-tachycardic but hypothermic. His lung sounds are diminished due to poor respiratory effort. He does not appear to be in distress. Type of Study: Bedside Swallow Evaluation Diet Prior to this Study: NPO History of Recent Intubation: Yes Date extubated: 12/05/17 Behavior/Cognition: Alert;Cooperative;Pleasant mood Oral Cavity Assessment: Dry;Dried secretions Oral Care Completed by SLP: Yes Oral Cavity - Dentition: Adequate natural dentition Patient Positioning: Upright in bed Baseline Vocal Quality: Wet Volitional Swallow: Able to elicit(Difficult to elicit, markedly reduced)    Oral/Motor/Sensory Function Overall Oral Motor/Sensory Function: Generalized oral weakness Facial ROM: Reduced right;Reduced left Facial Symmetry: Within Functional Limits Facial Strength: Reduced right;Reduced left Lingual ROM: Reduced right;Reduced left Lingual Symmetry: Within Functional Limits Lingual Strength: Reduced Velum: Within Functional Limits Mandible: Within Functional Limits   Ice Chips Ice chips: Impaired Presentation: Spoon Oral Phase Impairments: Reduced labial seal;Reduced lingual movement/coordination;Poor awareness of bolus Oral Phase Functional Implications: Oral holding Pharyngeal Phase Impairments: Unable to trigger swallow;Throat Clearing - Delayed   Thin Liquid      Nectar Thick     Honey Thick     Puree Puree:  Impaired Presentation: Spoon Oral Phase Impairments: Reduced labial seal;Reduced lingual movement/coordination;Poor awareness of bolus Oral Phase Functional Implications: Oral holding Pharyngeal Phase Impairments: Unable to trigger swallow   Solid            Andersson Larrabee, MA, CCC-SLP 12/06/2017,9:41 AM

## 2017-12-06 NOTE — Progress Notes (Signed)
Patient was given dose of tube feeding just prior to scheduled chest PT. Chest PT held due to potential aspiration.

## 2017-12-06 NOTE — Progress Notes (Signed)
Physical Therapy Treatment Patient Details Name: Marc [name] MRN: [id number] DOB: 02-15-1941 Today's Date: 12/06/2017    History of Present Illness 77 y.o. male admitted on 11/29/2017 with sepsis no presumed menigitis p/w AMS, lethargy, generalized weakness. 77 y.o. male PMH of HTN, HLD. Intubated 11/30/17 for inability to protect airway/unresponsiveness, extubated 12/05/17, DVT RUE placed on heparin drip.    PT Comments    Patient alert at start of session, still with difficulty verbalizing this morning but easily following multi-step commands. Patients BP 140/104 lying in bed, able to perform bed level exercises with varying levels of assist needed, BP reassessed with slight lowering, nursing notified and consented to further mobility. Patient mobilized to EOB with modAx1, able to sit EOB ~38minutes, with minimal complaints of "whooziness". Sit to stand attempted with maxAx1 and RW, unable to achieve full standing position/take any steps. Patient stated that he needed the bedpan, returned to supine modAx1. Patient participated in bed mobility to aid nursing care, able to roll minAx1 reach for rails several times during session to L and R, and repositioned up in bed modAx2. Very fatigued at end of mobility. Patient left with nursing staff and family at bedside.     Follow Up Recommendations  SNF     Equipment Recommendations  Other (comment)(Patient reports have SPC, RW and bedside commode at home)    Recommendations for Other Services       Precautions / Restrictions Precautions Precautions: Fall Restrictions Weight Bearing Restrictions: No    Mobility  Bed Mobility Overal bed mobility: Needs Assistance Bed Mobility: Supine to Sit;Rolling;Sit to Supine Rolling: Min assist   Supine to sit: HOB elevated;Mod assist Sit to supine: Mod assist   General bed mobility comments: Patient able to inititate movement, move LE, reach for rails, eager to participate in  movement  Transfers Overall transfer level: Needs assistance   Transfers: Sit to/from Stand Sit to Stand: Max assist         General transfer comment: able to initate movement, minAx1 to scoot to EOB, unable to achieve fully standing position, fatigued quickly  Ambulation/Gait             General Gait Details: deferred for nursing care   Stairs             Wheelchair Mobility    Modified Rankin (Stroke Patients Only)       Balance Overall balance assessment: Needs assistance Sitting-balance support: Feet supported;Bilateral upper extremity supported Sitting balance-Leahy Scale: Poor       Standing balance-Leahy Scale: Zero                              Cognition Arousal/Alertness: Awake/alert Behavior During Therapy: WFL for tasks assessed/performed Overall Cognitive Status: Within Functional Limits for tasks assessed                                        Exercises General Exercises - Lower Extremity Ankle Circles/Pumps: AROM;10 reps;Both Heel Slides: AAROM;Both;10 reps Hip ABduction/ADduction: AAROM;Both;10 reps Straight Leg Raises: AROM;Both;10 reps    General Comments        Pertinent Vitals/Pain Pain Assessment: Faces Faces Pain Scale: Hurts a little bit    Home Living                      Prior Function  PT Goals (current goals can now be found in the care plan section) Progress towards PT goals: Progressing toward goals    Frequency    Min 2X/week      PT Plan Current plan remains appropriate    Co-evaluation              AM-PAC PT "6 Clicks" Daily Activity  Outcome Measure  Difficulty turning over in bed (including adjusting bedclothes, sheets and blankets)?: A Lot Difficulty moving from lying on back to sitting on the side of the bed? : Unable Difficulty sitting down on and standing up from a chair with arms (e.g., wheelchair, bedside commode, etc,.)?:  Unable Help needed moving to and from a bed to chair (including a wheelchair)?: A Lot Help needed walking in hospital room?: Total Help needed climbing 3-5 steps with a railing? : Total 6 Click Score: 8    End of Session Equipment Utilized During Treatment: Gait belt Activity Tolerance: Patient limited by fatigue Patient left: in bed;with nursing/sitter in room;with call bell/phone within reach;with family/visitor present Nurse Communication: Mobility status PT Visit Diagnosis: Unsteadiness on feet (R26.81);Difficulty in walking, not elsewhere classified (R26.2);Other abnormalities of gait and mobility (R26.89);Muscle weakness (generalized) (M62.81)     Time: 1005-1050 PT Time Calculation (min) (ACUTE ONLY): 45 min  Charges:  $Therapeutic Exercise: 8-22 mins $Therapeutic Activity: 23-37 mins                     Lieutenant Diego PT, DPT 11:14 AM,12/06/17 [phone]

## 2017-12-06 NOTE — Progress Notes (Signed)
Nutrition Follow-up  DOCUMENTATION CODES:   Severe malnutrition in context of chronic illness, Underweight  INTERVENTION:  Received verbal order to begin tubefeeding per CCM  Vital 1.5 @ 84mL/hr via NGT Pro-stat 46mL Daily 233mL free water every 6 hours  Provides 1900 calories, 96 grams of protein, 1546mL free water  NUTRITION DIAGNOSIS:   Severe Malnutrition related to chronic illness(etiology unknown at this time) as evidenced by moderate fat depletion, severe fat depletion, moderate muscle depletion, severe muscle depletion. -ongoing  GOAL:   Patient will meet greater than or equal to 90% of their needs -will meet with TF regimen  MONITOR:   I & O's, Labs, TF tolerance, Weight trends  ASSESSMENT:   77 year old male with PMHx of HTN, HLD, anxiety, BPH, hx of colon adenomas, diverticulosis who was admitted with AMS of unclear etiology, required intubation on 8/3 for airway protection, possible PNA.  Extubated yesterday. Dobhoff tube placed today after patient failed swallow eval again.  Labs reviewed:  Na 149; BUN/Cr 45/1.80  Medications reviewed and include:  Colace, Insulin  Diet Order:   Diet Order            Diet NPO time specified  Diet effective now              EDUCATION NEEDS:   No education needs have been identified at this time  Skin:  Skin Assessment: Skin Integrity Issues:(closed incision to back from LP)  Last BM:  12/05/2017  Height:   Ht Readings from Last 1 Encounters:  11/30/17 5\' 9"  (1.753 m)    Weight:   Wt Readings from Last 1 Encounters:  12/06/17 62 kg    Ideal Body Weight:  72.7 kg  BMI:  Body mass index is 20.18 kg/m.  Estimated Nutritional Needs:   Kcal:  1690-1950 (MSJ x 1.3-1.5)  Protein:  90-100 grams (1.5-1.8 grams/kg)  Fluid:  1.4-1.7 L/day (25-30 mL/kg)    Satira Anis. Braxdon Gappa, MS, RD LDN Inpatient Clinical Dietitian Pager [phone]

## 2017-12-06 NOTE — Care Management Note (Signed)
Case Management Note  Patient Details  Name: DILLIN LOFGREN MRN: [id number] Date of Birth: 1940/09/27  Subjective/Objective:                  RNCM met with patient and wife while in ICU pending transfer to floor care today. Patient attempted to talk but was hard to understand- voice raspy.  Patient's wife shares that patient is from home with her and had been independent up until about March and starting using a cane.  He stopped driving a month ago. She shares that he has become increasingly weaker and has lost over 25 pounds over the last few months.  He is not able to tolerate PO currently and care team is ordering Dobhoff for nutrition and medications.  He owns a front-wheeled walker. He uses [facility] for medications and they deny difficulty obtaining. He is not on supplemental O2 at home and not requiring it currently. Dr. Ginette Pitman with [facility] is PCP.   Action/Plan: Home health agency list provided to patient for review.    Expected Discharge Date:                  Expected Discharge Plan:     In-House Referral:  Clinical Social Work  Discharge planning Services  CM Consult  Post Acute Care Choice:  Home Health Choice offered to:  Patient, Spouse  DME Arranged:    DME Agency:     HH Arranged:  PT, RN, Nurse's Aide [place] Agency:     Status of Service:  In process, will continue to follow  If discussed at Long Length of Stay Meetings, dates discussed:    Additional Comments:  Marshell Garfinkel, RN 12/06/2017, 11:23 AM

## 2017-12-06 NOTE — Progress Notes (Addendum)
[place] [facility] [place] NAME: Marc [name]    MR#:  [id number]  DATE OF BIRTH:  08-01-1940  SUBJECTIVE:  Wife is at bedside.  Patient is still confused REVIEW OF SYSTEMS:    Unable to obtain patient confused   tolerating Diet: Yes    DRUG ALLERGIES:   Allergies  Allergen Reactions  . Glimepiride   . Tape Itching and Rash    VITALS:  Blood pressure (!) 139/99, pulse 77, temperature 97.6 F (36.4 C), temperature source Oral, resp. rate (!) 31, height 5\' 9"  (1.753 m), weight 62 kg, SpO2 100 %.  PHYSICAL EXAMINATION:  Constitutional: Appears critically ill-appearing  HEENT: Normocephalic  eyes: Conjunctivae are normal.  neck:. No JVD. No tracheal deviation. CVS: RRR, S1/S2 +, no murmurs, no gallops, no carotid bruit.  Pulmonary: Effort and breath sounds normal, no stridor, rhonchi, wheezes, rales.  Abdominal: Soft. BS +,  no distension, tenderness, rebound or guarding.  Musculoskeletal:  No edema and no tenderness.  Neuro: Awake confused this morning follows some commands skin: Skin is warm and dry. No rash noted. Psychiatric: Confused  LABORATORY PANEL:   CBC Recent Labs  Lab 12/06/17 0503  WBC 6.2  HGB 11.6*  HCT 34.0*  PLT 128*   ------------------------------------------------------------------------------------------------------------------  Chemistries  Recent Labs  Lab 12/02/17 0808  12/06/17 1002  NA 143   < > 149*  K 3.2*   < > 4.5  CL 104   < > 107  CO2 30   < > 29  GLUCOSE 140*   < > 163*  BUN 29*   < > 45*  CREATININE 1.31*   < > 1.80*  CALCIUM 8.2*   < > 9.2  MG 1.9   < > 2.0  AST 29  --   --   ALT 33  --   --   ALKPHOS 44  --   --   BILITOT 0.7  --   --    < > = values in this interval not displayed.   ------------------------------------------------------------------------------------------------------------------  Cardiac Enzymes Recent Labs  Lab 11/29/17 1649  TROPONINI <0.03    ------------------------------------------------------------------------------------------------------------------  RADIOLOGY:  US Venous Img Upper Uni Right  Result Date: 12/03/2017 CLINICAL DATA:  Right upper extremity pain and edema. Evaluate for DVT. EXAM: RIGHT UPPER EXTREMITY VENOUS DOPPLER ULTRASOUND TECHNIQUE: Gray-scale sonography with graded compression, as well as color Doppler and duplex ultrasound were performed to evaluate the upper extremity deep venous system from the level of the subclavian vein and including the jugular, axillary, basilic, radial, ulnar and upper cephalic vein. Spectral Doppler was utilized to evaluate flow at rest and with distal augmentation maneuvers. COMPARISON:  Chest radiograph-12/02/2018 FINDINGS: Contralateral Subclavian Vein: Respiratory phasicity is normal and symmetric with the symptomatic side. No evidence of thrombus. Normal compressibility. Internal Jugular Vein: No evidence of thrombus. Normal compressibility, respiratory phasicity and response to augmentation. Subclavian Vein: There is nonocclusive DVT surrounding the right upper extremity approach PICC line at the level of the peripheral aspect of the right subclavian vein (images 10 and 11). Axillary Vein: No evidence of thrombus. Normal compressibility, respiratory phasicity and response to augmentation. Cephalic Vein: There is mixed echogenic occlusive thrombus involving the distal aspect of the cephalic vein (image 18 through 21). The proximal aspect of the cephalic vein appears widely patent (image 16). Basilic Vein: No evidence of thrombus. Normal compressibility, respiratory phasicity and response to augmentation. Brachial Veins: No evidence of thrombus. Normal compressibility, respiratory phasicity  and response to augmentation. Radial Veins: No evidence of thrombus though rouleaux flow was demonstrated. Normal compressibility, respiratory phasicity and response to augmentation. Ulnar Veins: No  evidence of thrombus. Normal compressibility, respiratory phasicity and response to augmentation. Venous Reflux:  None visualized. Other Findings:  None visualized. IMPRESSION: 1. The examination is positive for nonocclusive DVT surrounding the right upper extremity approach PICC line at level peripheral aspect of the right subclavian vein. 2. Examination is positive for occlusive SVT involving the cephalic vein at the level the mid and distal humerus. Electronically Signed   By: Sandi Mariscal M.D.   On: 12/03/2017 15:03   Dg Chest Port 1 View  Result Date: 12/05/2017 CLINICAL DATA:  Acute onset of respiratory failure. EXAM: PORTABLE CHEST 1 VIEW COMPARISON:  Chest radiograph performed 12/01/2017 FINDINGS: The patient's endotracheal tube is seen ending 6 cm above the carina. An enteric tube is noted extending below the diaphragm. A right PICC is noted ending about the distal SVC. Bibasilar airspace opacities, right greater than left, may reflect pneumonia. A small left pleural effusion is noted. No pneumothorax is seen. An apparent calcified granuloma is noted at the right midlung zone. The cardiomediastinal silhouette is normal in size. No acute osseous abnormalities are identified. IMPRESSION: 1. Endotracheal tube seen ending 6 cm above the carina. 2. Bibasilar airspace opacities, right greater than left, may reflect pneumonia. Small left pleural effusion noted. Electronically Signed   By: Garald Balding M.D.   On: 12/05/2017 00:43     ASSESSMENT AND PLAN:   77 year old male with history of essential hypertension who presented to the emergency room initially due to generalized weakness and weight loss.   1.  Acute hypoxic respiratory failure due to inability to protect airway/responsiveness and patient was intubated by ED physician. Patient extubated and doing well  2.  Hypothermia: This has resolved He is status post LP which does not appear to be bacterial meningitis  3.  Acute encephalopathy of  unclear etiology:  CT head without acute pathology MRI brain essentially unremarkable  HSV negative EEG characterized by slowing which is consistent with normal drowsy state.  No epileptic activity is noted. This is not due to meningitis Neurology consultation appreciated.    4.  Weight loss: Work-up outpatient after patient is more stable  5.  Diabetes with A1c of 7.4 Sliding scale  6.  H. pylori gastritis (recent dg): Continue current triple therapy through 8/16   7.  Hypotension after intubation: Weaned off of norepinephrine  8.  Hypokalemia: Replete as needed.  9. nonocclusive DVT surrounding the right upper extremity approach PICC line at level peripheral aspect of the right subclavian vein. and occlusive SVT involving the cephalic vein at the level the mid and distal humerus.: cont heparin gtt  10. Hypernatremia: watch sodium level if still increased tomorrow then may need to adjust fluids  Patient will need skilled nursing facility upon discharge   CODE STATUS: FULL  TOTAL TIME TAKING CARE OF THIS PATIENT: 21 minutes.      POSSIBLE D/C ??, DEPENDING ON CLINICAL CONDITION.   Stanlee Roehrig M.D on 12/06/2017 at 11:42 AM  Between 7am to 6pm - Pager - [phone] After 6pm go to [email or web address] - password [email or web address] [facility]  Office  [phone]  CC: Primary care physician; Tracie Harrier, MD  Note: This dictation was prepared with Dragon dictation along with smaller phrase technology. Any transcriptional errors that result from this process are unintentional.

## 2017-12-06 NOTE — Progress Notes (Signed)
Pharmacy Electrolyte/Glucose/Constipation Monitoring Consult:  Pharmacy consulted to assist in monitoring and replacing electrolytes in this 77 y.o. male admitted on 11/29/2017 with Weakness and Dizziness  Patient ordered Miralax and senna/docusate 2 tabs BID.   Labs:  Sodium (mmol/L)  Date Value  12/06/2017 149 (H)   Potassium (mmol/L)  Date Value  12/06/2017 4.5   Magnesium (mg/dL)  Date Value  12/06/2017 2.0   Phosphorus (mg/dL)  Date Value  12/04/2017 2.9   Calcium (mg/dL)  Date Value  12/06/2017 9.2   Albumin (g/dL)  Date Value  12/02/2017 2.6 (L)    Assessment/Plan: 1. Electrolytes:  Goal potassium ~ 4 and goal magnesium ~ 2. No replacement needed at this time. Will recheck electrolytes with am labs.   2. Glucose management: Continue SSI. Patient currently NPO.   3. Constipation management: Patient with bowel movements on 8/8 and 8/9. Will change senna/docusate to just docusate BID.   Pharmacy will continue to monitor and adjust per consult.   Pernell Dupre, PharmD, BCPS Clinical Pharmacist 12/06/2017 11:22 AM

## 2017-12-06 NOTE — Progress Notes (Signed)
ANTICOAGULATION CONSULT NOTE  Pharmacy Consult for heparin drip management  Indication: DVT  Patient Measurements: Height: 5\' 9"  (175.3 cm) Weight: 136 lb 11 oz (62 kg) IBW/kg (Calculated) : 70.7   Vital Signs: Temp: 97.9 F (36.6 C) (08/09 0354) Temp Source: Oral (08/09 0354) BP: 110/90 (08/09 0500) Pulse Rate: 80 (08/09 0500)  Labs: Recent Labs    12/03/17 1611  12/04/17 0539 12/05/17 0334 12/05/17 1325 12/05/17 2118 12/06/17 0503  HGB  --    < > 11.4* 10.2*  --   --  11.6*  HCT  --   --  33.8* 30.1*  --   --  34.0*  PLT  --   --  93* 115*  --   --  128*  APTT 38*  --   --   --   --   --   --   LABPROT 15.8*  --   --   --   --   --   --   INR 1.27  --   --   --   --   --   --   HEPARINUNFRC  --    < > 0.38 0.29* 0.47 0.35 0.43  CREATININE  --   --  0.95 1.20  --   --   --    < > = values in this interval not displayed.    Estimated Creatinine Clearance: 46.6 mL/min (by C-G formula based on SCr of 1.2 mg/dL).  Medical History: Past Medical History:  Diagnosis Date  . Anxiety   . BPH (benign prostatic hyperplasia)   . Colon adenomas    history of  . Diverticulosis   . Hyperlipidemia   . Hypertension    Assessment: Pharmacy consulted for heparin drip management for 77 yo male admitted with AMS and being treated for DVT of right upper extremety. Patient remains sedated and requiring mechanical ventilation. 8/8 AM heparin level 0.29. Patient was given 900 unit bolus and rate was increased to 1100 units/hr.   Goal of Therapy:  Heparin level 0.3-0.7 units/ml Monitor platelets by anticoagulation protocol: Yes   08/08 1430 HL therapeutic @ 0.47. Will continue with current heparin rate of 1100 units/hr. Will recheck confirmatory level in 8 hours. CBC with AM labs per protocol.   Plan:   8/8: HL @ 2118 = 0.35 Will continue pt on current rate and recheck HL on 8/9 with AM labs.   8/9 AM heparin level 0.43. Continue current regimen. Recheck heparin level and CBC  with tomorrow AM labs.  Pharmacy will continue to monitor and adjust per consult.   Eloise Harman, PharmD Clinical Pharmacist 12/06/2017 5:24 AM

## 2017-12-07 ENCOUNTER — Inpatient Hospital Stay: Payer: PPO

## 2017-12-07 ENCOUNTER — Inpatient Hospital Stay: Admit: 2017-12-07 | Payer: PPO

## 2017-12-07 LAB — BASIC METABOLIC PANEL
Anion gap: 10 (ref 5–15)
Anion gap: 7 (ref 5–15)
BUN: 38 mg/dL — AB (ref 8–23)
BUN: 39 mg/dL — AB (ref 8–23)
CO2: 37 mmol/L — ABNORMAL HIGH (ref 22–32)
CO2: 34 mmol/L — ABNORMAL HIGH (ref 22–32)
CREATININE: 1.31 mg/dL — AB (ref 0.61–1.24)
CREATININE: 1.01 mg/dL (ref 0.61–1.24)
Calcium: 9.1 mg/dL (ref 8.9–10.3)
Calcium: 7.9 mg/dL — ABNORMAL LOW (ref 8.9–10.3)
Chloride: 104 mmol/L (ref 98–111)
Chloride: 105 mmol/L (ref 98–111)
GFR calc Af Amer: >60 mL/min (ref >60)
GFR, EST AFRICAN AMERICAN: 60 mL/min — AB (ref >60)
GFR, EST NON AFRICAN AMERICAN: 52 mL/min — AB (ref >60)
Glucose, Bld: 249 mg/dL — ABNORMAL HIGH (ref 70–99)
Glucose, Bld: 124 mg/dL — ABNORMAL HIGH (ref 70–99)
POTASSIUM: 3.5 mmol/L (ref 3.5–5.1)
Potassium: 3 mmol/L — ABNORMAL LOW (ref 3.5–5.1)
SODIUM: 151 mmol/L — AB (ref 135–145)
Sodium: 146 mmol/L — ABNORMAL HIGH (ref 135–145)

## 2017-12-07 LAB — BLOOD GAS, ARTERIAL
Acid-Base Excess: 7.9 mmol/L — ABNORMAL HIGH (ref 0.0–2.0)
BICARBONATE: 35.1 mmol/L — AB (ref 20.0–28.0)
Delivery systems: POSITIVE
Expiratory PAP: 8
FIO2: 0.3
INSPIRATORY PAP: 18
O2 SAT: 96.4 %
PO2 ART: 76 mmHg — AB (ref 83.0–108.0)
Patient temperature: 35.1
pCO2 arterial: 58 mmHg — ABNORMAL HIGH (ref 32.0–48.0)
pH, Arterial: 7.42 (ref 7.350–7.450)

## 2017-12-07 LAB — CULTURE, BLOOD (ROUTINE X 2)
Culture: NO GROWTH
Culture: NO GROWTH
Special Requests: ADEQUATE

## 2017-12-07 LAB — GLUCOSE, CAPILLARY
GLUCOSE-CAPILLARY: 217 mg/dL — AB (ref 70–99)
GLUCOSE-CAPILLARY: 129 mg/dL — AB (ref 70–99)
GLUCOSE-CAPILLARY: 202 mg/dL — AB (ref 70–99)
Glucose-Capillary: 125 mg/dL — ABNORMAL HIGH (ref 70–99)
Glucose-Capillary: 174 mg/dL — ABNORMAL HIGH (ref 70–99)
Glucose-Capillary: 194 mg/dL — ABNORMAL HIGH (ref 70–99)
Glucose-Capillary: 256 mg/dL — ABNORMAL HIGH (ref 70–99)

## 2017-12-07 LAB — HEMOGLOBIN A1C
Hgb A1c MFr Bld: 7.4 % — ABNORMAL HIGH (ref 4.8–5.6)
MEAN PLASMA GLUCOSE: 165.68 mg/dL

## 2017-12-07 LAB — HEPARIN LEVEL (UNFRACTIONATED): HEPARIN UNFRACTIONATED: 0.44 [IU]/mL (ref 0.30–0.70)

## 2017-12-07 LAB — TROPONIN I: TROPONIN I: 0.98 ng/mL — AB (ref <0.03)

## 2017-12-07 LAB — CBC
HCT: 41.5 % (ref 40.0–52.0)
Hemoglobin: 13.9 g/dL (ref 13.0–18.0)
MCH: 32.7 pg (ref 26.0–34.0)
MCHC: 33.5 g/dL (ref 32.0–36.0)
MCV: 97.7 fL (ref 80.0–100.0)
Platelets: 198 10*3/uL (ref 150–440)
RBC: 4.25 MIL/uL — ABNORMAL LOW (ref 4.40–5.90)
RDW: 13.1 % (ref 11.5–14.5)
WBC: 8.7 10*3/uL (ref 3.8–10.6)

## 2017-12-07 LAB — PHOSPHORUS: PHOSPHORUS: 2.6 mg/dL (ref 2.5–4.6)

## 2017-12-07 LAB — MAGNESIUM: MAGNESIUM: 1.9 mg/dL (ref 1.7–2.4)

## 2017-12-07 MED ORDER — LABETALOL HCL 5 MG/ML IV SOLN
10.0000 mg | Freq: Four times a day (QID) | INTRAVENOUS | Status: DC | PRN
  Administered 2017-12-08: 10 mg via INTRAVENOUS
  Filled 2017-12-07: qty 4

## 2017-12-07 MED ORDER — INSULIN GLARGINE 100 UNIT/ML ~~LOC~~ SOLN
10.0000 [IU] | Freq: Every day | SUBCUTANEOUS | Status: DC
  Administered 2017-12-07 – 2017-12-11 (×5): 10 [IU] via SUBCUTANEOUS
  Filled 2017-12-07 (×6): qty 0.1

## 2017-12-07 MED ORDER — AMLODIPINE BESYLATE 10 MG PO TABS
10.0000 mg | ORAL_TABLET | Freq: Every day | ORAL | Status: DC
  Administered 2017-12-08: 10 mg via ORAL
  Filled 2017-12-07 (×2): qty 1

## 2017-12-07 MED ORDER — FREE WATER
200.0000 mL | Status: DC
  Administered 2017-12-07 – 2017-12-08 (×8): 200 mL

## 2017-12-07 MED ORDER — POTASSIUM CHLORIDE 20 MEQ PO PACK
20.0000 meq | PACK | ORAL | Status: AC
  Administered 2017-12-07 (×3): 20 meq
  Filled 2017-12-07 (×3): qty 1

## 2017-12-07 MED ORDER — FREE WATER: 200.0000 mL | Freq: Four times a day (QID) | Status: DC

## 2017-12-07 MED ORDER — SODIUM CHLORIDE 0.9 % IV SOLN
Freq: Once | INTRAVENOUS | Status: AC
  Administered 2017-12-07: 15:00:00 via INTRAVENOUS

## 2017-12-07 MED ORDER — SODIUM CHLORIDE 0.45 % IV SOLN
INTRAVENOUS | Status: AC
  Administered 2017-12-07: 12:00:00 via INTRAVENOUS

## 2017-12-07 MED ORDER — FREE WATER
200.0000 mL | Status: DC
  Administered 2017-12-07 (×2): 200 mL

## 2017-12-07 MED ORDER — HYDRALAZINE HCL 50 MG PO TABS
50.0000 mg | ORAL_TABLET | Freq: Three times a day (TID) | ORAL | Status: DC
  Administered 2017-12-07 – 2017-12-08 (×4): 50 mg via ORAL
  Filled 2017-12-07 (×5): qty 1

## 2017-12-07 MED ORDER — SODIUM CHLORIDE 0.45 % IV SOLN: INTRAVENOUS | Status: DC

## 2017-12-07 MED ORDER — ORAL CARE MOUTH RINSE
15.0000 mL | Freq: Two times a day (BID) | OROMUCOSAL | Status: DC
  Administered 2017-12-07 – 2017-12-08 (×3): 15 mL via OROMUCOSAL

## 2017-12-07 MED ORDER — CHLORHEXIDINE GLUCONATE 0.12 % MT SOLN
15.0000 mL | Freq: Two times a day (BID) | OROMUCOSAL | Status: DC
  Administered 2017-12-07 – 2017-12-09 (×6): 15 mL via OROMUCOSAL
  Filled 2017-12-07 (×4): qty 15

## 2017-12-07 MED ORDER — INSULIN ASPART 100 UNIT/ML ~~LOC~~ SOLN
0.0000 [IU] | SUBCUTANEOUS | Status: DC
  Administered 2017-12-07: 3 [IU] via SUBCUTANEOUS
  Administered 2017-12-07: 4 [IU] via SUBCUTANEOUS
  Administered 2017-12-07: 11 [IU] via SUBCUTANEOUS
  Administered 2017-12-07: 7 [IU] via SUBCUTANEOUS
  Administered 2017-12-08: 4 [IU] via SUBCUTANEOUS
  Administered 2017-12-08: 11 [IU] via SUBCUTANEOUS
  Administered 2017-12-08: 7 [IU] via SUBCUTANEOUS
  Administered 2017-12-08: 4 [IU] via SUBCUTANEOUS
  Administered 2017-12-09: 3 [IU] via SUBCUTANEOUS
  Administered 2017-12-09 (×3): 4 [IU] via SUBCUTANEOUS
  Administered 2017-12-09: 7 [IU] via SUBCUTANEOUS
  Administered 2017-12-09: 4 [IU] via SUBCUTANEOUS
  Administered 2017-12-10: 3 [IU] via SUBCUTANEOUS
  Administered 2017-12-10: 4 [IU] via SUBCUTANEOUS
  Administered 2017-12-10: 3 [IU] via SUBCUTANEOUS
  Administered 2017-12-10: 4 [IU] via SUBCUTANEOUS
  Administered 2017-12-10 (×2): 3 [IU] via SUBCUTANEOUS
  Administered 2017-12-11 (×2): 4 [IU] via SUBCUTANEOUS
  Administered 2017-12-11 (×2): 3 [IU] via SUBCUTANEOUS
  Administered 2017-12-12: 4 [IU] via SUBCUTANEOUS
  Filled 2017-12-07 (×24): qty 1

## 2017-12-07 NOTE — Progress Notes (Signed)
Bipap on standby, Pt on 3L [place] with sats of 100%

## 2017-12-07 NOTE — Progress Notes (Signed)
SLP Cancellation Note  Patient Details Name: Marc [name] MRN: [id number] DOB: Nov 11, 1940   Cancelled treatment:       Reason Eval/Treat Not Completed: Medical issues which prohibited therapy;Patient's level of consciousness;Patient not medically ready Nursing reports patient placed on BiPAP and will not be able to attempt PO's today. SLP to reattempt trials of PO's when appropriate. Dobhoff currently in place.  [name], MA, CCC-SLP 12/07/2017, 1:44 PM

## 2017-12-07 NOTE — Progress Notes (Signed)
Patient placed back on Bipap due to desaturations in resp status, patient was desating to the high 70's= resp called and made aware.. MD notified and made aware.  O2 sats now above 95% on Bipap.

## 2017-12-07 NOTE — Progress Notes (Signed)
Decreased to 2lnc after neb treatment

## 2017-12-07 NOTE — Progress Notes (Addendum)
Name: Marc [name] MRN: [id number] DOB: Mar 05, 1941     CONSULTATION DATE: 11/29/2017  Subjective & objective: Patient remains on a Cardene drip for blood pressure control with improved mental status.  PAST MEDICAL HISTORY :   has a past medical history of Anxiety, BPH (benign prostatic hyperplasia), Colon adenomas, Diverticulosis, Hyperlipidemia, and Hypertension.  has a past surgical history that includes Cholecystectomy; Colonoscopy with propofol (N/A, 01/23/2016); and Esophagogastroduodenoscopy (egd) with propofol (N/A, 11/27/2017). Prior to Admission medications   Medication Sig Start Date End Date Taking? Authorizing Provider  carvedilol (COREG) 25 MG tablet Take 25 mg by mouth 2 (two) times daily with a meal.   Yes [provider]  imipramine (TOFRANIL) 50 MG tablet Take 50 mg by mouth at bedtime.   Yes [provider]  megestrol (MEGACE) 40 MG/ML suspension Take 400 mg by mouth 2 (two) times daily.    Yes [provider]  amLODipine (NORVASC) 5 MG tablet Take 1 tablet (5 mg total) by mouth daily. Patient not taking: Reported on 11/29/2017 02/18/15   Loletha Grayer, MD  amoxicillin (AMOXIL) 500 MG tablet Take 2 tablets (1,000 mg total) by mouth 2 (two) times daily for 14 days. 11/29/17 12/13/17  Carrie Mew, MD  carbamide peroxide [facility]) 6.5 % otic solution Place 5 drops into the right ear 2 (two) times daily. Patient not taking: Reported on 11/29/2017 02/18/15   Loletha Grayer, MD  clarithromycin (BIAXIN) 500 MG tablet Take 1 tablet (500 mg total) by mouth 2 (two) times daily for 14 days. 11/29/17 12/13/17  Carrie Mew, MD  meclizine (ANTIVERT) 25 MG tablet Take 1 tablet (25 mg total) by mouth 3 (three) times daily. Patient not taking: Reported on 11/29/2017 02/18/15   Loletha Grayer, MD  pantoprazole (PROTONIX) 40 MG tablet Take 1 tablet (40 mg total) by mouth 2 (two) times daily for 14 days. 11/29/17 12/13/17  Carrie Mew, MD   Allergies    Allergen Reactions  . Glimepiride   . Tape Itching and Rash    FAMILY HISTORY:  family history includes Hypertension in his other. SOCIAL HISTORY:  reports that he has never smoked. He has never used smokeless tobacco. He reports that he does not drink alcohol or use drugs.  REVIEW OF SYSTEMS:   Unable to obtain due to critical illness   VITAL SIGNS: Temp:  [97.5 F (36.4 C)-98.2 F (36.8 C)] 97.7 F (36.5 C) (08/10 0800) Pulse Rate:  [86-121] 101 (08/10 0730) Resp:  [15-31] 20 (08/10 1000) BP: (94-194)/(71-136) 94/73 (08/10 0953) SpO2:  [86 %-100 %] 97 % (08/10 0744) FiO2 (%):  [40 %] 40 % (08/09 2000) Weight:  [54.8 kg] 54.8 kg (08/10 0425)  Physical Examination:  Lethargic, moves all extremities and following simple commands On nasal cannula, no distress, bilateral equal air entry no adventitious sounds S1 & S2 are audible with no murmur Benign abdominal exam was normal peristalsis No leg edema   ASSESSMENT / PLAN:  Acute respiratory failure status post extubation.  Tolerating nasal cannula in no distress -Monitor work of breathing and O2 sats.  Altered mental status with hypertensive encephalopathy.  LP negative, no acute abnormality on CT head and brain MRI and no epileptiform activity on EEG. -Monitor neuro status  Hypertensive emergency -Optimize antihypertensives and monitor hemodynamics  Atelectasis and pneumonia.  Bibasilar airspace disease -Respiratory care, consider covering with antibiotics for worsening airspace disease, WBC or increased FiO2 requirement  Diabetes mellitus -Glycemic control  AKI, hypernatremia and contraction alkalosis with intravascular  volume depletion -Optimize hydration, monitor renal panel and urine output  DVT right subclavian vein (nonocclusive) -Heparin drip  No onset A. fib with controlled ventricular rate -Monitor EKG and follow with echocardiogram.  H. pylori on triple therapy (clarithromycin + amoxicillin +  PPI)  Dysphagia on DHT feeding  Full code  DVT & GI prophylaxis.  Continue with supportive care  Critical care time 35 minutes

## 2017-12-07 NOTE — Progress Notes (Signed)
Patient placed back on bipap today; patient was desating and nasal cannula, placed back on bipap for continued respiratory support. Per MD, patient's sodium elevated and patient dehydrated -IV fluids given. Patient appeared tired and rested most of the day. Patient's temperature low rectally, unable to obtain axillary - MD made aware and patient placed on bear hugger. Foley re-inserted to maintain accurate intake and output and due to patient status. Patient weaned off of Nicardipine drip this shift, MD aware of occasional drops to the 40's in patient heart rate after discontinuation of drip. EKG, ABG and labs rechecked this shift.

## 2017-12-07 NOTE — Progress Notes (Signed)
[facility] [place] NAME: Marc [name]    MR#:  [id number]  DATE OF BIRTH:  [birth year]  SUBJECTIVE:  CHIEF COMPLAINT:   Chief Complaint  Patient presents with  . Weakness  . Dizziness  No events overnight, case discussed with intensivist, continue to wean Cardene drip as tolerated  REVIEW OF SYSTEMS:  CONSTITUTIONAL: No fever, fatigue or weakness.  EYES: No blurred or double vision.  EARS, NOSE, AND THROAT: No tinnitus or ear pain.  RESPIRATORY: No cough, shortness of breath, wheezing or hemoptysis.  CARDIOVASCULAR: No chest pain, orthopnea, edema.  GASTROINTESTINAL: No nausea, vomiting, diarrhea or abdominal pain.  GENITOURINARY: No dysuria, hematuria.  ENDOCRINE: No polyuria, nocturia,  HEMATOLOGY: No anemia, easy bruising or bleeding SKIN: No rash or lesion. MUSCULOSKELETAL: No joint pain or arthritis.   NEUROLOGIC: No tingling, numbness, weakness.  PSYCHIATRY: No anxiety or depression.   ROS  DRUG ALLERGIES:   Allergies  Allergen Reactions  . Glimepiride   . Tape Itching and Rash    VITALS:  Blood pressure 103/63, pulse 75, temperature 97.7 F (36.5 C), temperature source Axillary, resp. rate 20, height 5\' 9"  (1.753 m), weight 54.8 kg, SpO2 97 %.  PHYSICAL EXAMINATION:  GENERAL:  77 y.o.-year-old patient lying in the bed with no acute distress.  EYES: Pupils equal, round, reactive to light and accommodation. No scleral icterus. Extraocular muscles intact.  HEENT: Head atraumatic, normocephalic. Oropharynx and nasopharynx clear.  NECK:  Supple, no jugular venous distention. No thyroid enlargement, no tenderness.  LUNGS: Normal breath sounds bilaterally, no wheezing, rales,rhonchi or crepitation. No use of accessory muscles of respiration.  CARDIOVASCULAR: S1, S2 normal. No murmurs, rubs, or gallops.  ABDOMEN: Soft, nontender, nondistended. Bowel sounds present. No organomegaly or mass.  EXTREMITIES: No pedal edema,  cyanosis, or clubbing.  NEUROLOGIC: Cranial nerves II through XII are intact. Muscle strength 5/5 in all extremities. Sensation intact. Gait not checked.  PSYCHIATRIC: The patient is alert and oriented x 3.  SKIN: No obvious rash, lesion, or ulcer.   Physical Exam LABORATORY PANEL:   CBC Recent Labs  Lab 12/07/17 0526  WBC 8.7  HGB 13.9  HCT 41.5  PLT 198   ------------------------------------------------------------------------------------------------------------------  Chemistries  Recent Labs  Lab 12/06/17 2035 12/07/17 0526  NA 148* 151*  K 4.0 3.0*  CL 106 104  CO2 33* 37*  GLUCOSE 253* 249*  BUN 46* 38*  CREATININE 1.56* 1.31*  CALCIUM 8.9 9.1  MG 1.9 1.9  AST 40  --   ALT 31  --   ALKPHOS 68  --   BILITOT 0.7  --    ------------------------------------------------------------------------------------------------------------------  Cardiac Enzymes Recent Labs  Lab 12/06/17 2035  TROPONINI 0.69*   ------------------------------------------------------------------------------------------------------------------  RADIOLOGY:  Dg Chest Port 1 View  Result Date: 12/06/2017 CLINICAL DATA:  Dyspnea. EXAM: PORTABLE CHEST 1 VIEW COMPARISON:  12/05/2017 and older exams. FINDINGS: Since the prior study, the endotracheal tube and nasogastric tube have been removed. Right PICC is stable and well positioned. There is opacity at both lung bases similar on the right to the prior exam, increased on the left, consistent with small effusions and associated atelectasis. Remainder of the lungs is clear. No pneumothorax. Cardiac silhouette is normal in size. IMPRESSION: 1. Mild worsening in lung aeration at the left lung base with persistent right lung base opacity. Basilar lung opacities are most likely combination of small effusions and atelectasis. 2. Status post extubation and removal of the nasogastric tube since  the prior exam. Electronically Signed   By: Lajean Manes M.D.    On: 12/06/2017 20:04   Dg Abd Portable 1v  Result Date: 12/07/2017 CLINICAL DATA:  Nasogastric tube placement. EXAM: PORTABLE ABDOMEN - 1 VIEW COMPARISON:  Abdominal radiograph performed earlier today at 12:01 p.m. FINDINGS: The patient's enteric tube is noted ending overlying the body of the stomach. The visualized bowel gas pattern is grossly unremarkable. Clips are noted within the right upper quadrant, reflecting prior cholecystectomy. A small left pleural effusion is noted. No acute osseous abnormalities are identified. IMPRESSION: Enteric tube noted ending overlying the body of the stomach. Electronically Signed   By: Garald Balding M.D.   On: 12/07/2017 00:14   Dg Abd Portable 1v  Result Date: 12/06/2017 CLINICAL DATA:  New Dobbhoff tube placement EXAM: PORTABLE ABDOMEN - 1 VIEW COMPARISON:  None. FINDINGS: The Dobbhoff tube terminates in the distal stomach. Recommend advancement. Small bilateral pleural effusions and underlying atelectasis. IMPRESSION: The Dobbhoff tube terminates in the distal stomach. Recommend repositioning. Electronically Signed   By: Dorise Bullion III M.D   On: 12/06/2017 12:43    ASSESSMENT AND PLAN:  77 year old male with history of essential hypertension who presented to the emergency room initially due to generalized weakness and weight loss.   *Acute hypoxic respiratory failure  due to inability to protect airway/responsiveness - intubated by ED physician Status post extubation, wean O2 off as tolerated   *Acute malignant hypertension Wean Cardene drip off as tolerated  *Acute hypothermia Resolved He is status post LP which does not appear to be bacterial meningitis  *Acute encephalopathy of unclear etiology:  CT head without acute pathology MRI brain essentially unremarkable  HSV negative EEG characterized by slowing which is consistent with normal drowsy state.  No epileptic activity is noted. This is not due to meningitis Neurology input  appreciated  *Weight loss Consider dietary consultation, check pre-albumin level  *Diabetes mellitus type 2  Scale insulin with Accu-Cheks per routine   *Acute H. pylori gastritis  Continue current triple therapy through 8/16  *Hypotension after intubation Resolved Weaned off of norepinephrine  *Hypokalemia Replete as needed.  *nonocclusive DVT surrounding the right upper extremity approach PICC line at level peripheral aspect of the right subclavian vein. and occlusive SVT involving the cephalic vein at the level the mid and distal humerus cont heparin   *Hypernatremia Resolved with IV fluids for rehydration  Patient will need skilled nursing facility upon discharge   All the records are reviewed and case discussed with Care Management/Social Workerr. Management plans discussed with the patient, family and they are in agreement.  CODE STATUS:full  TOTAL TIME TAKING CARE OF THIS PATIENT: 40 minutes.     POSSIBLE D/C IN 2-5 DAYS, DEPENDING ON CLINICAL CONDITION.   Avel Peace Lindwood Mogel M.D on 12/07/2017   Between 7am to 6pm - Pager - [phone]  After 6pm go to [email or web address] - password [email or web address] [facility]  Office  [phone]  CC: Primary care physician; Tracie Harrier, MD  Note: This dictation was prepared with Dragon dictation along with smaller phrase technology. Any transcriptional errors that result from this process are unintentional.

## 2017-12-07 NOTE — Progress Notes (Signed)
ANTICOAGULATION CONSULT NOTE  Pharmacy Consult for heparin drip management  Indication: DVT  Patient Measurements: Height: 5\' 9"  (175.3 cm) Weight: 120 lb 13 oz (54.8 kg) IBW/kg (Calculated) : 70.7   Vital Signs: Temp: 98 F (36.7 C) (08/10 0435) Temp Source: Axillary (08/10 0435) BP: 142/79 (08/10 0500) Pulse Rate: 100 (08/10 0530)  Labs: Recent Labs    12/05/17 0334  12/05/17 2118 12/06/17 0503 12/06/17 1002 12/06/17 2035 12/07/17 0526  HGB 10.2*  --   --  11.6*  --   --  13.9  HCT 30.1*  --   --  34.0*  --   --  41.5  PLT 115*  --   --  128*  --   --  198  HEPARINUNFRC 0.29*   < > 0.35 0.43  --   --  0.44  CREATININE 1.20  --   --   --  1.80* 1.56* 1.31*  TROPONINI  --   --   --   --   --  0.69*  --    < > = values in this interval not displayed.    Estimated Creatinine Clearance: 37.8 mL/min (A) (by C-G formula based on SCr of 1.31 mg/dL (H)).  Medical History: Past Medical History:  Diagnosis Date  . Anxiety   . BPH (benign prostatic hyperplasia)   . Colon adenomas    history of  . Diverticulosis   . Hyperlipidemia   . Hypertension    Assessment: Pharmacy consulted for heparin drip management for 77 yo male admitted with AMS and being treated for DVT of right upper extremety. Patient remains sedated and requiring mechanical ventilation. 8/8 AM heparin level 0.29. Patient was given 900 unit bolus and rate was increased to 1100 units/hr.   Goal of Therapy:  Heparin level 0.3-0.7 units/ml Monitor platelets by anticoagulation protocol: Yes   08/08 1430 HL therapeutic @ 0.47. Will continue with current heparin rate of 1100 units/hr. Will recheck confirmatory level in 8 hours. CBC with AM labs per protocol.   Plan:   8/8: HL @ 2118 = 0.35 Will continue pt on current rate and recheck HL on 8/9 with AM labs.   8/9 AM heparin level 0.43. Continue current regimen. Recheck heparin level and CBC with tomorrow AM labs.  8/10 AM heparin level 0.44. Continue  current regimen. Recheck heparin level and CBC with tomorrow AM labs.  Pharmacy will continue to monitor and adjust per consult.   Eloise Harman, PharmD Clinical Pharmacist 12/07/2017 5:52 AM

## 2017-12-07 NOTE — Progress Notes (Signed)
Pharmacy Electrolyte/Glucose/Constipation Monitoring Consult:  Pharmacy consulted to assist in monitoring and replacing electrolytes in this 77 y.o. male admitted on 11/29/2017 with Weakness and Dizziness  Patient ordered Miralax and senna/docusate 2 tabs BID.   Labs:  Sodium (mmol/L)  Date Value  12/07/2017 151 (H)   Potassium (mmol/L)  Date Value  12/07/2017 3.0 (L)   Magnesium (mg/dL)  Date Value  12/07/2017 1.9   Phosphorus (mg/dL)  Date Value  12/07/2017 2.6   Calcium (mg/dL)  Date Value  12/07/2017 9.1   Albumin (g/dL)  Date Value  12/06/2017 2.7 (L)    Assessment/Plan: 1. Electrolytes:  Goal potassium ~ 4 and goal magnesium ~ 2. No replacement needed at this time. Will recheck electrolytes with am labs.   8/10 AM  K+ 3.0. 20 mEq KCl x 3 doses per tube ordered. BMP in AM.  2. Glucose management: Continue SSI. Patient currently NPO.   3. Constipation management: Patient with bowel movements on 8/8 and 8/9. Will change senna/docusate to just docusate BID.   Pharmacy will continue to monitor and adjust per consult.   Eloise Harman, PharmD, BCPS Clinical Pharmacist 12/07/2017 5:58 AM

## 2017-12-07 NOTE — Progress Notes (Signed)
PRN Labetalol given for BP 181/91. Continue to monitor closely.

## 2017-12-08 ENCOUNTER — Inpatient Hospital Stay
Admit: 2017-12-08 | Discharge: 2017-12-08 | Disposition: A | Discharge status: Home / Self Care | Payer: PPO | Attending: Internal Medicine | Admitting: Internal Medicine

## 2017-12-08 ENCOUNTER — Inpatient Hospital Stay: Payer: PPO

## 2017-12-08 LAB — GLUCOSE, CAPILLARY
GLUCOSE-CAPILLARY: 205 mg/dL — AB (ref 70–99)
GLUCOSE-CAPILLARY: 65 mg/dL — AB (ref 70–99)
GLUCOSE-CAPILLARY: 179 mg/dL — AB (ref 70–99)
GLUCOSE-CAPILLARY: 143 mg/dL — AB (ref 70–99)
Glucose-Capillary: 112 mg/dL — ABNORMAL HIGH (ref 70–99)
Glucose-Capillary: 118 mg/dL — ABNORMAL HIGH (ref 70–99)
Glucose-Capillary: 187 mg/dL — ABNORMAL HIGH (ref 70–99)
Glucose-Capillary: 260 mg/dL — ABNORMAL HIGH (ref 70–99)

## 2017-12-08 LAB — BASIC METABOLIC PANEL
ANION GAP: 11 (ref 5–15)
ANION GAP: 4 — AB (ref 5–15)
BUN: 33 mg/dL — ABNORMAL HIGH (ref 8–23)
BUN: 31 mg/dL — ABNORMAL HIGH (ref 8–23)
CALCIUM: 8.5 mg/dL — AB (ref 8.9–10.3)
CO2: 35 mmol/L — AB (ref 22–32)
CO2: 33 mmol/L — ABNORMAL HIGH (ref 22–32)
Calcium: 7.7 mg/dL — ABNORMAL LOW (ref 8.9–10.3)
Chloride: 104 mmol/L (ref 98–111)
Chloride: 103 mmol/L (ref 98–111)
Creatinine, Ser: 1.12 mg/dL (ref 0.61–1.24)
Creatinine, Ser: 1.01 mg/dL (ref 0.61–1.24)
GFR calc Af Amer: >60 mL/min (ref >60)
GFR calc Af Amer: >60 mL/min (ref >60)
GLUCOSE: 224 mg/dL — AB (ref 70–99)
Glucose, Bld: 155 mg/dL — ABNORMAL HIGH (ref 70–99)
POTASSIUM: 3.2 mmol/L — AB (ref 3.5–5.1)
POTASSIUM: 3.3 mmol/L — AB (ref 3.5–5.1)
SODIUM: 140 mmol/L (ref 135–145)
Sodium: 150 mmol/L — ABNORMAL HIGH (ref 135–145)

## 2017-12-08 LAB — BLOOD GAS, ARTERIAL
Acid-Base Excess: 10.7 mmol/L — ABNORMAL HIGH (ref 0.0–2.0)
Bicarbonate: 40.9 mmol/L — ABNORMAL HIGH (ref 20.0–28.0)
DELIVERY SYSTEMS: POSITIVE
Expiratory PAP: 8
FIO2: 0.3
Inspiratory PAP: 18
O2 Saturation: 95.7 %
PATIENT TEMPERATURE: 37
PCO2 ART: 85 mmHg — AB (ref 32.0–48.0)
pH, Arterial: 7.29 — ABNORMAL LOW (ref 7.350–7.450)
pO2, Arterial: 88 mmHg (ref 83.0–108.0)

## 2017-12-08 LAB — CBC WITH DIFFERENTIAL/PLATELET
Basophils Absolute: 0 10*3/uL (ref 0–0.1)
Basophils Relative: 0 %
EOS ABS: 0 10*3/uL (ref 0–0.7)
Eosinophils Relative: 0 %
HCT: 42.1 % (ref 40.0–52.0)
HEMOGLOBIN: 14.1 g/dL (ref 13.0–18.0)
LYMPHS ABS: 0.7 10*3/uL — AB (ref 1.0–3.6)
Lymphocytes Relative: 9 %
MCH: 32.8 pg (ref 26.0–34.0)
MCHC: 33.5 g/dL (ref 32.0–36.0)
MCV: 97.7 fL (ref 80.0–100.0)
Monocytes Absolute: 1 10*3/uL (ref 0.2–1.0)
Monocytes Relative: 12 %
NEUTROS PCT: 79 %
Neutro Abs: 6.4 10*3/uL (ref 1.4–6.5)
Platelets: 212 10*3/uL (ref 150–440)
RBC: 4.3 MIL/uL — AB (ref 4.40–5.90)
RDW: 13.7 % (ref 11.5–14.5)
WBC: 8.1 10*3/uL (ref 3.8–10.6)

## 2017-12-08 LAB — HEPARIN LEVEL (UNFRACTIONATED): HEPARIN UNFRACTIONATED: 0.46 [IU]/mL (ref 0.30–0.70)

## 2017-12-08 LAB — CKMB (ARMC ONLY): CK, MB: 2.2 ng/mL (ref 0.5–5.0)

## 2017-12-08 LAB — MAGNESIUM: Magnesium: 1.8 mg/dL (ref 1.7–2.4)

## 2017-12-08 LAB — TROPONIN I: Troponin I: 0.52 ng/mL (ref <0.03)

## 2017-12-08 LAB — PHOSPHORUS: Phosphorus: 3.5 mg/dL (ref 2.5–4.6)

## 2017-12-08 MED ORDER — SODIUM CHLORIDE 0.45 % IV SOLN
INTRAVENOUS | Status: AC
  Administered 2017-12-08 (×2): 75 mL/h via INTRAVENOUS

## 2017-12-08 MED ORDER — FREE WATER
200.0000 mL | Status: DC
  Administered 2017-12-08 – 2017-12-09 (×12): 200 mL

## 2017-12-08 MED ORDER — SODIUM CHLORIDE 0.45 % IV SOLN
INTRAVENOUS | Status: DC
  Administered 2017-12-08: 23:00:00 via INTRAVENOUS

## 2017-12-08 MED ORDER — METOPROLOL TARTRATE 50 MG PO TABS
50.0000 mg | ORAL_TABLET | Freq: Two times a day (BID) | ORAL | Status: DC
  Administered 2017-12-08 (×2): 50 mg via ORAL
  Filled 2017-12-08 (×2): qty 1

## 2017-12-08 MED ORDER — DEXTROSE 50 % IV SOLN
INTRAVENOUS | Status: AC
  Administered 2017-12-08: 25 mL via INTRAVENOUS
  Filled 2017-12-08: qty 50

## 2017-12-08 MED ORDER — MORPHINE SULFATE (PF) 2 MG/ML IV SOLN
2.0000 mg | INTRAVENOUS | Status: DC | PRN
  Administered 2017-12-08 – 2017-12-09 (×2): 2 mg via INTRAVENOUS
  Filled 2017-12-08 (×2): qty 1

## 2017-12-08 MED ORDER — GUAIFENESIN-DM 100-10 MG/5ML PO SYRP
10.0000 mL | ORAL_SOLUTION | ORAL | Status: DC | PRN
  Filled 2017-12-08: qty 10

## 2017-12-08 MED ORDER — TRAZODONE HCL 100 MG PO TABS
100.0000 mg | ORAL_TABLET | Freq: Every evening | ORAL | Status: DC | PRN
  Administered 2017-12-09: 100 mg via ORAL
  Filled 2017-12-08: qty 1

## 2017-12-08 MED ORDER — ASPIRIN EC 81 MG PO TBEC
81.0000 mg | DELAYED_RELEASE_TABLET | Freq: Every day | ORAL | Status: DC
  Administered 2017-12-08 – 2017-12-09 (×2): 81 mg via ORAL
  Filled 2017-12-08 (×2): qty 1

## 2017-12-08 MED ORDER — MAGNESIUM SULFATE 2 GM/50ML IV SOLN
2.0000 g | Freq: Once | INTRAVENOUS | Status: AC
  Administered 2017-12-08: 2 g via INTRAVENOUS
  Filled 2017-12-08: qty 50

## 2017-12-08 MED ORDER — POTASSIUM CHLORIDE 20 MEQ PO PACK
40.0000 meq | PACK | Freq: Three times a day (TID) | ORAL | Status: DC
  Administered 2017-12-08 – 2017-12-09 (×3): 40 meq via ORAL
  Filled 2017-12-08 (×3): qty 2

## 2017-12-08 MED ORDER — DEXTROSE 50 % IV SOLN
25.0000 mL | Freq: Once | INTRAVENOUS | Status: AC
  Administered 2017-12-08: 25 mL via INTRAVENOUS

## 2017-12-08 MED ORDER — PHENOL 1.4 % MT LIQD
1.0000 | OROMUCOSAL | Status: DC | PRN
  Filled 2017-12-08: qty 177

## 2017-12-08 NOTE — Progress Notes (Signed)
Notified Maggie, NP of patients rhythm changes called by CCMD. See new orders.

## 2017-12-08 NOTE — Progress Notes (Signed)
Pharmacy Electrolyte/Glucose/Constipation Monitoring Consult:  Pharmacy consulted to assist in monitoring and replacing electrolytes in this 77 y.o. male admitted on 11/29/2017 with Weakness and Dizziness  Patient ordered Miralax and senna/docusate 2 tabs BID.   Labs:  Sodium (mmol/L)  Date Value  12/08/2017 150 (H)   Potassium (mmol/L)  Date Value  12/08/2017 3.2 (L)   Magnesium (mg/dL)  Date Value  12/08/2017 1.8   Phosphorus (mg/dL)  Date Value  12/08/2017 3.5   Calcium (mg/dL)  Date Value  12/08/2017 8.5 (L)   Albumin (g/dL)  Date Value  12/06/2017 2.7 (L)    Assessment/Plan: 1. Electrolytes:  Goal potassium ~ 4 and goal magnesium ~ 2. 8/11 K 3.2, Phos 3.5, Mag 1.8 - pt already ordered KCl 40 mEq PO x3 doses. Will order mag sulfate 2 g IV x1 per policy as K is low. Will recheck electrolytes with am labs.   2. Glucose management: BG 118-256.  Continue SSI; Lantus 10 units qHS started yesterday by NP.  Patient currently NPO.   3. Constipation management: Patient with bowel movements on 8/8 and 8/9. Will change senna/docusate to just docusate BID.   Pharmacy will continue to monitor and adjust per consult.   Rocky Morel, PharmD, BCPS Clinical Pharmacist 12/08/2017 8:33 AM

## 2017-12-08 NOTE — Progress Notes (Signed)
RT called to room to help RN place BiPap back on patient due to saturations dropping into the 60's, once patient placed back on bipap his saturations returned into the mid to upper 90's. Dr.Samaan notified, and agreed for RT to draw ABG on patient.

## 2017-12-08 NOTE — Progress Notes (Signed)
[facility] for heparin drip management  Indication: DVT  Patient Measurements: Height: 5\' 9"  (175.3 cm) Weight: 120 lb 13 oz (54.8 kg) IBW/kg (Calculated) : 70.7   Vital Signs: Temp: 97.7 F (36.5 C) (08/11 0200) Temp Source: Axillary (08/11 0200) BP: 157/79 (08/11 0600) Pulse Rate: 102 (08/11 0600)  Labs: Recent Labs    12/06/17 0503  12/06/17 2035 12/07/17 0526 12/07/17 1648 12/08/17 0601  HGB 11.6*  --   --  13.9  --  14.1  HCT 34.0*  --   --  41.5  --  42.1  PLT 128*  --   --  198  --  212  HEPARINUNFRC 0.43  --   --  0.44  --  0.46  CREATININE  --    < > 1.56* 1.31* 1.01 1.12  TROPONINI  --   --  0.69*  --  0.98*  --    < > = values in this interval not displayed.    Estimated Creatinine Clearance: 44.2 mL/min (by C-G formula based on SCr of 1.12 mg/dL).  Medical History: Past Medical History:  Diagnosis Date  . Anxiety   . BPH (benign prostatic hyperplasia)   . Colon adenomas    history of  . Diverticulosis   . Hyperlipidemia   . Hypertension    Assessment: Pharmacy consulted for heparin drip management for 77 yo male admitted with AMS and being treated for DVT of right upper extremety. Patient remains sedated and requiring mechanical ventilation. 8/8 AM heparin level 0.29. Patient was given 900 unit bolus and rate was increased to 1100 units/hr.   Goal of Therapy:  Heparin level 0.3-0.7 units/ml Monitor platelets by anticoagulation protocol: Yes   08/08 1430 HL therapeutic @ 0.47. Will continue with current heparin rate of 1100 units/hr. Will recheck confirmatory level in 8 hours. CBC with AM labs per protocol.   Plan:   8/8: HL @ 2118 = 0.35 Will continue pt on current rate and recheck HL on 8/9 with AM labs.   8/9 AM heparin level 0.43. Continue current regimen. Recheck heparin level and CBC with tomorrow AM labs.  8/10 AM heparin level 0.44. Continue current regimen. Recheck heparin level and CBC with  tomorrow AM labs.  8/11 AM heparin level 0.46. Continue current regimen. Recheck heparin level and CBC with tomorrow AM labs.   Pharmacy will continue to monitor and adjust per consult.   Eloise Harman, PharmD Clinical Pharmacist 12/08/2017 7:09 AM

## 2017-12-08 NOTE — Progress Notes (Signed)
[facility] [place] NAME: Marc [name]    MR#:  [id number]  DATE OF BIRTH:  1940-06-25  SUBJECTIVE:  CHIEF COMPLAINT:   Chief Complaint  Patient presents with  . Weakness  . Dizziness  improved, no complications overnight, successfully weaned off Cardizem drip  REVIEW OF SYSTEMS:  CONSTITUTIONAL: No fever, fatigue or weakness.  EYES: No blurred or double vision.  EARS, NOSE, AND THROAT: No tinnitus or ear pain.  RESPIRATORY: No cough, shortness of breath, wheezing or hemoptysis.  CARDIOVASCULAR: No chest pain, orthopnea, edema.  GASTROINTESTINAL: No nausea, vomiting, diarrhea or abdominal pain.  GENITOURINARY: No dysuria, hematuria.  ENDOCRINE: No polyuria, nocturia,  HEMATOLOGY: No anemia, easy bruising or bleeding SKIN: No rash or lesion. MUSCULOSKELETAL: No joint pain or arthritis.   NEUROLOGIC: No tingling, numbness, weakness.  PSYCHIATRY: No anxiety or depression.   ROS  DRUG ALLERGIES:   Allergies  Allergen Reactions  . Glimepiride   . Tape Itching and Rash    VITALS:  Blood pressure 117/68, pulse 81, temperature 97.7 F (36.5 C), temperature source Axillary, resp. rate 20, height 5\' 9"  (1.753 m), weight 54.8 kg, SpO2 100 %.  PHYSICAL EXAMINATION:  GENERAL:  77 y.o.-year-old patient lying in the bed with no acute distress.  EYES: Pupils equal, round, reactive to light and accommodation. No scleral icterus. Extraocular muscles intact.  HEENT: Head atraumatic, normocephalic. Oropharynx and nasopharynx clear.  NECK:  Supple, no jugular venous distention. No thyroid enlargement, no tenderness.  LUNGS: Normal breath sounds bilaterally, no wheezing, rales,rhonchi or crepitation. No use of accessory muscles of respiration.  CARDIOVASCULAR: S1, S2 normal. No murmurs, rubs, or gallops.  ABDOMEN: Soft, nontender, nondistended. Bowel sounds present. No organomegaly or mass.  EXTREMITIES: No pedal edema, cyanosis, or clubbing.   NEUROLOGIC: Cranial nerves II through XII are intact. Muscle strength 5/5 in all extremities. Sensation intact. Gait not checked.  PSYCHIATRIC: The patient is alert and oriented x 3.  SKIN: No obvious rash, lesion, or ulcer.   Physical Exam LABORATORY PANEL:   CBC Recent Labs  Lab 12/08/17 0601  WBC 8.1  HGB 14.1  HCT 42.1  PLT 212   ------------------------------------------------------------------------------------------------------------------  Chemistries  Recent Labs  Lab 12/06/17 2035  12/08/17 0601  NA 148*   < > 150*  K 4.0   < > 3.2*  CL 106   < > 104  CO2 33*   < > 35*  GLUCOSE 253*   < > 224*  BUN 46*   < > 33*  CREATININE 1.56*   < > 1.12  CALCIUM 8.9   < > 8.5*  MG 1.9   < > 1.8  AST 40  --   --   ALT 31  --   --   ALKPHOS 68  --   --   BILITOT 0.7  --   --    < > = values in this interval not displayed.   ------------------------------------------------------------------------------------------------------------------  Cardiac Enzymes Recent Labs  Lab 12/07/17 1648 12/08/17 1020  TROPONINI 0.98* 0.52*   ------------------------------------------------------------------------------------------------------------------  RADIOLOGY:  Dg Chest Port 1 View  Result Date: 12/08/2017 CLINICAL DATA:  Atelectasis EXAM: PORTABLE CHEST 1 VIEW COMPARISON:  Two days ago FINDINGS: An orogastric tube and side-port reaches the stomach which appears decompressed. Right upper extremity PICC in good position. Artifact from EKG leads. Low volume chest with hazy opacities at both bases. On chest CT 11/30/2017 there is atelectasis at the bases. No Kerley lines  or pneumothorax. Normal heart size for technique. IMPRESSION: Stable atelectasis with possible small volume pleural fluid at the bases. Electronically Signed   By: Monte Fantasia M.D.   On: 12/08/2017 07:13   Dg Chest Port 1 View  Result Date: 12/06/2017 CLINICAL DATA:  Dyspnea. EXAM: PORTABLE CHEST 1 VIEW  COMPARISON:  12/05/2017 and older exams. FINDINGS: Since the prior study, the endotracheal tube and nasogastric tube have been removed. Right PICC is stable and well positioned. There is opacity at both lung bases similar on the right to the prior exam, increased on the left, consistent with small effusions and associated atelectasis. Remainder of the lungs is clear. No pneumothorax. Cardiac silhouette is normal in size. IMPRESSION: 1. Mild worsening in lung aeration at the left lung base with persistent right lung base opacity. Basilar lung opacities are most likely combination of small effusions and atelectasis. 2. Status post extubation and removal of the nasogastric tube since the prior exam. Electronically Signed   By: Lajean Manes M.D.   On: 12/06/2017 20:04   Dg Abd Portable 1v  Result Date: 12/07/2017 CLINICAL DATA:  Nasogastric tube placement. EXAM: PORTABLE ABDOMEN - 1 VIEW COMPARISON:  Abdominal radiograph performed earlier today at 12:01 p.m. FINDINGS: The patient's enteric tube is noted ending overlying the body of the stomach. The visualized bowel gas pattern is grossly unremarkable. Clips are noted within the right upper quadrant, reflecting prior cholecystectomy. A small left pleural effusion is noted. No acute osseous abnormalities are identified. IMPRESSION: Enteric tube noted ending overlying the body of the stomach. Electronically Signed   By: Garald Balding M.D.   On: 12/07/2017 00:14    ASSESSMENT AND PLAN:  77 year old male with history of essential hypertension who presented to the emergency room initially due to generalized weakness and weight loss.   *Acute hypoxic respiratory failure  due to inability to protect airway/responsiveness - intubated by ED physician Status post extubation, wean O2 off as tolerated   *Acute malignant hypertension Much improved Successfully weaned off Cardene drip  *Acute hypothermia Resolved He is status post LP which does not appear to be  bacterial meningitis  *Acute encephalopathy of unclear etiology:  ? PRES CT head without acute pathology MRI brain essentially unremarkable  HSV negative EEG characterized by slowing which is consistent with normal drowsy state. No epileptic activity is noted. Neurology input appreciated, neurochecks per routine, aspiration/fall precautions while in house  *Weight loss Consider dietary consultation  *Diabetes mellitus type 2  Scale insulin with Accu-Cheks per routine   *Acute H. pylori gastritis  Continue current triple therapy through 8/16  *Hypotension after intubation Resolved Weaned off of norepinephrine  *Hypokalemia Replete as needed.  *nonocclusive DVT surrounding the right upper extremity approach PICC line at level peripheral aspect of the right subclavian vein. and occlusive SVT involving the cephalic vein at the level the mid and distal humerus cont heparin   *Hypernatremia Resolved with IV fluids for rehydration  Patient may require skilled nursing facility upon discharge   All the records are reviewed and case discussed with Care Management/Social Workerr. Management plans discussed with the patient, family and they are in agreement.  CODE STATUS: full  TOTAL TIME TAKING CARE OF THIS PATIENT: 35 minutes.     POSSIBLE D/C IN 2-3 DAYS, DEPENDING ON CLINICAL CONDITION.   Avel Peace Burnett Lieber M.D on 12/08/2017   Between 7am to 6pm - Pager - [phone]  After 6pm go to [email or web address] - password [email or web address]  [employer]  Office  [phone]  CC: Primary care physician; Tracie Harrier, MD  Note: This dictation was prepared with Dragon dictation along with smaller phrase technology. Any transcriptional errors that result from this process are unintentional.

## 2017-12-08 NOTE — Progress Notes (Signed)
CCMD called to notify me that patient has been going in/out of Afib, HR up to 130's and not sustaining with a widend QRS and multifocal beats.

## 2017-12-08 NOTE — Progress Notes (Signed)
Name: Marc [name] MRN: [id number] DOB: [birth year]     CONSULTATION DATE: 11/29/2017  Subjective & Objective: Improved mental status, off carden gtt. Episodes of SVT and elevated Trop. No chest pain.  PAST MEDICAL HISTORY :   has a past medical history of Anxiety, BPH (benign prostatic hyperplasia), Colon adenomas, Diverticulosis, Hyperlipidemia, and Hypertension.  has a past surgical history that includes Cholecystectomy; Colonoscopy with propofol (N/A, 01/23/2016); and Esophagogastroduodenoscopy (egd) with propofol (N/A, 11/27/2017). Prior to Admission medications   Medication Sig Start Date End Date Taking? Authorizing Provider  carvedilol (COREG) 25 MG tablet Take 25 mg by mouth 2 (two) times daily with a meal.   Yes [provider]  imipramine (TOFRANIL) 50 MG tablet Take 50 mg by mouth at bedtime.   Yes [provider]  megestrol (MEGACE) 40 MG/ML suspension Take 400 mg by mouth 2 (two) times daily.    Yes [provider]  amLODipine (NORVASC) 5 MG tablet Take 1 tablet (5 mg total) by mouth daily. Patient not taking: Reported on 11/29/2017 02/18/15   Loletha Grayer, MD  amoxicillin (AMOXIL) 500 MG tablet Take 2 tablets (1,000 mg total) by mouth 2 (two) times daily for 14 days. 11/29/17 12/13/17  Carrie Mew, MD  carbamide peroxide [facility]) 6.5 % otic solution Place 5 drops into the right ear 2 (two) times daily. Patient not taking: Reported on 11/29/2017 02/18/15   Loletha Grayer, MD  clarithromycin (BIAXIN) 500 MG tablet Take 1 tablet (500 mg total) by mouth 2 (two) times daily for 14 days. 11/29/17 12/13/17  Carrie Mew, MD  meclizine (ANTIVERT) 25 MG tablet Take 1 tablet (25 mg total) by mouth 3 (three) times daily. Patient not taking: Reported on 11/29/2017 02/18/15   Loletha Grayer, MD  pantoprazole (PROTONIX) 40 MG tablet Take 1 tablet (40 mg total) by mouth 2 (two) times daily for 14 days. 11/29/17 12/13/17  Carrie Mew, MD   Allergies    Allergen Reactions  . Glimepiride   . Tape Itching and Rash    FAMILY HISTORY:  family history includes Hypertension in his other. SOCIAL HISTORY:  reports that he has never smoked. He has never used smokeless tobacco. He reports that he does not drink alcohol or use drugs.  REVIEW OF SYSTEMS:   Unable to obtain due to critical illness   VITAL SIGNS: Temp:  [78.3 F (25.7 C)-99.7 F (37.6 C)] 97.7 F (36.5 C) (08/11 0200) Pulse Rate:  [37-109] 98 (08/11 0700) Resp:  [13-30] 20 (08/11 0700) BP: (81-181)/(52-101) 155/84 (08/11 0700) SpO2:  [95 %-100 %] 100 % (08/11 0700) FiO2 (%):  [30 %-32 %] 32 % (08/10 2051) Weight:  [54.8 kg] 54.8 kg (08/11 0500)  Physical Examination:  Lethargic, moves all extremities and following simple commands On nasal cannula, no distress, bilateral equal air entry no adventitious sounds S1 & S2 are audible with no murmur Benign abdominal exam was normal peristalsis No leg edema   ASSESSMENT / PLAN:  Acute respiratory failure status post extubation.  Tolerating nasal cannula in no distress -Monitor work of breathing and O2 sats.  Altered mental status (improved) with hypertensive encephalopathy.  LP negative, no acute abnormality on CT head and brain MRI and no epileptiform activity on EEG. -Monitor neuro status  Hypertensive emergency. Off [place] gtt. -Optimize antihypertensives and monitor hemodynamics  Atelectasis and pneumonia. Improved bibasilar airspace disease -Respiratory care, consider covering with antibiotics for worsening airspace disease, WBC or increased FiO2 requirement  Diabetes mellitus -Glycemic control  AKI,  hypernatremia and contraction alkalosis with intravascular volume depletion -Optimize hydration, monitor renal panel and urine output  DVT right subclavian vein (nonocclusive) -Heparin drip  No onset A. fib with controlled ventricular rate Elevated Trop with possible demand vs. Supply mismatch /  NSTEMI -Monitor EKG, cardiac enzymes and echocardiogram. -ASA + Heparin gtt -Cardiology consult.  H. pylori on triple therapy (clarithromycin + amoxicillin + PPI)  Dysphagia on DHT feeding  Hypokalemia -Replete and monitor electrolytes.  Full code  DVT & GI prophylaxis.  Continue with supportive care  Critical care time 35 minutes

## 2017-12-08 NOTE — Progress Notes (Signed)
Tube feed free water flushed changed to q2 hours on pump.

## 2017-12-08 NOTE — Progress Notes (Signed)
Decreased Epap to 5 and increased FIO2 to 35%. Per Dr. Soyla Murphy.

## 2017-12-09 ENCOUNTER — Inpatient Hospital Stay: Payer: PPO

## 2017-12-09 DIAGNOSIS — J9602 Acute respiratory failure with hypercapnia: Secondary | ICD-10-CM

## 2017-12-09 DIAGNOSIS — E43 Unspecified severe protein-calorie malnutrition: Secondary | ICD-10-CM

## 2017-12-09 DIAGNOSIS — R531 Weakness: Secondary | ICD-10-CM

## 2017-12-09 DIAGNOSIS — G9340 Encephalopathy, unspecified: Secondary | ICD-10-CM

## 2017-12-09 LAB — BLOOD GAS, ARTERIAL
Acid-Base Excess: 11 mmol/L — ABNORMAL HIGH (ref 0.0–2.0)
Bicarbonate: 36.4 mmol/L — ABNORMAL HIGH (ref 20.0–28.0)
FIO2: 0.6
Mechanical Rate: 15
O2 SAT: 99.4 %
PATIENT TEMPERATURE: 37
PCO2 ART: 50 mmHg — AB (ref 32.0–48.0)
PEEP: 5 cmH2O
PH ART: 7.47 — AB (ref 7.350–7.450)
VT: 500 mL
pO2, Arterial: 145 mmHg — ABNORMAL HIGH (ref 83.0–108.0)

## 2017-12-09 LAB — GLUCOSE, CAPILLARY
GLUCOSE-CAPILLARY: 139 mg/dL — AB (ref 70–99)
GLUCOSE-CAPILLARY: 185 mg/dL — AB (ref 70–99)
GLUCOSE-CAPILLARY: 190 mg/dL — AB (ref 70–99)
GLUCOSE-CAPILLARY: 207 mg/dL — AB (ref 70–99)
GLUCOSE-CAPILLARY: 212 mg/dL — AB (ref 70–99)
Glucose-Capillary: 153 mg/dL — ABNORMAL HIGH (ref 70–99)
Glucose-Capillary: 182 mg/dL — ABNORMAL HIGH (ref 70–99)
Glucose-Capillary: 198 mg/dL — ABNORMAL HIGH (ref 70–99)

## 2017-12-09 LAB — PHOSPHORUS: PHOSPHORUS: 2 mg/dL — AB (ref 2.5–4.6)

## 2017-12-09 LAB — CBC WITH DIFFERENTIAL/PLATELET
BASOS ABS: 0 10*3/uL (ref 0–0.1)
BASOS PCT: 0 %
Eosinophils Absolute: 0 10*3/uL (ref 0–0.7)
Eosinophils Relative: 1 %
HEMATOCRIT: 34.6 % — AB (ref 40.0–52.0)
Hemoglobin: 11.5 g/dL — ABNORMAL LOW (ref 13.0–18.0)
Lymphocytes Relative: 15 %
Lymphs Abs: 0.8 10*3/uL — ABNORMAL LOW (ref 1.0–3.6)
MCH: 32.5 pg (ref 26.0–34.0)
MCHC: 33.3 g/dL (ref 32.0–36.0)
MCV: 97.5 fL (ref 80.0–100.0)
MONO ABS: 0.7 10*3/uL (ref 0.2–1.0)
MONOS PCT: 14 %
NEUTROS ABS: 3.8 10*3/uL (ref 1.4–6.5)
Neutrophils Relative %: 70 %
PLATELETS: 181 10*3/uL (ref 150–440)
RBC: 3.55 MIL/uL — ABNORMAL LOW (ref 4.40–5.90)
RDW: 13.4 % (ref 11.5–14.5)
WBC: 5.3 10*3/uL (ref 3.8–10.6)

## 2017-12-09 LAB — BASIC METABOLIC PANEL
Anion gap: 5 (ref 5–15)
Anion gap: 11 (ref 5–15)
BUN: 32 mg/dL — AB (ref 8–23)
BUN: 32 mg/dL — ABNORMAL HIGH (ref 8–23)
CALCIUM: 8.1 mg/dL — AB (ref 8.9–10.3)
CO2: 34 mmol/L — ABNORMAL HIGH (ref 22–32)
CO2: 34 mmol/L — ABNORMAL HIGH (ref 22–32)
CREATININE: 1.23 mg/dL (ref 0.61–1.24)
CREATININE: 1.2 mg/dL (ref 0.61–1.24)
Calcium: 7.9 mg/dL — ABNORMAL LOW (ref 8.9–10.3)
Chloride: 105 mmol/L (ref 98–111)
Chloride: 97 mmol/L — ABNORMAL LOW (ref 98–111)
GFR calc non Af Amer: 56 mL/min — ABNORMAL LOW (ref >60)
GFR, EST NON AFRICAN AMERICAN: 57 mL/min — AB (ref >60)
Glucose, Bld: 159 mg/dL — ABNORMAL HIGH (ref 70–99)
Glucose, Bld: 283 mg/dL — ABNORMAL HIGH (ref 70–99)
POTASSIUM: 3.4 mmol/L — AB (ref 3.5–5.1)
Potassium: 4.3 mmol/L (ref 3.5–5.1)
SODIUM: 144 mmol/L (ref 135–145)
Sodium: 142 mmol/L (ref 135–145)

## 2017-12-09 LAB — CBC
HCT: 39.3 % — ABNORMAL LOW (ref 40.0–52.0)
HEMOGLOBIN: 12.9 g/dL — AB (ref 13.0–18.0)
MCH: 32.6 pg (ref 26.0–34.0)
MCHC: 32.7 g/dL (ref 32.0–36.0)
MCV: 99.6 fL (ref 80.0–100.0)
PLATELETS: 247 10*3/uL (ref 150–440)
RBC: 3.94 MIL/uL — AB (ref 4.40–5.90)
RDW: 13.4 % (ref 11.5–14.5)
WBC: 12.2 10*3/uL — ABNORMAL HIGH (ref 3.8–10.6)

## 2017-12-09 LAB — HEPARIN LEVEL (UNFRACTIONATED): Heparin Unfractionated: 0.37 IU/mL (ref 0.30–0.70)

## 2017-12-09 LAB — MAGNESIUM
MAGNESIUM: 2 mg/dL (ref 1.7–2.4)
MAGNESIUM: 1.7 mg/dL (ref 1.7–2.4)
Magnesium: 2.1 mg/dL (ref 1.7–2.4)

## 2017-12-09 LAB — ECHOCARDIOGRAM COMPLETE
Height: 69 in
WEIGHTICAEL: 1932.99 [oz_av]

## 2017-12-09 LAB — APTT: aPTT: 63 seconds — ABNORMAL HIGH (ref 24–36)

## 2017-12-09 LAB — PROTIME-INR
INR: 1.08
Prothrombin Time: 13.9 seconds (ref 11.4–15.2)

## 2017-12-09 LAB — TROPONIN I: Troponin I: 0.44 ng/mL (ref <0.03)

## 2017-12-09 MED ORDER — METOPROLOL TARTRATE 50 MG PO TABS
50.0000 mg | ORAL_TABLET | Freq: Two times a day (BID) | ORAL | Status: DC
  Administered 2017-12-09 – 2017-12-17 (×12): 50 mg
  Filled 2017-12-09 (×12): qty 1

## 2017-12-09 MED ORDER — ASPIRIN 81 MG PO CHEW
81.0000 mg | CHEWABLE_TABLET | Freq: Every day | ORAL | Status: AC
  Administered 2017-12-09 – 2017-12-12 (×4): 81 mg
  Filled 2017-12-09 (×4): qty 1

## 2017-12-09 MED ORDER — NOREPINEPHRINE 4 MG/250ML-% IV SOLN
0.0000 ug/min | INTRAVENOUS | Status: DC
Start: 2017-12-09 — End: 2017-12-10
  Administered 2017-12-09: 10 ug/min via INTRAVENOUS
  Filled 2017-12-09: qty 250

## 2017-12-09 MED ORDER — ACETAMINOPHEN 325 MG PO TABS
650.0000 mg | ORAL_TABLET | Freq: Four times a day (QID) | ORAL | Status: DC | PRN
  Administered 2017-12-10 – 2017-12-20 (×2): 650 mg
  Filled 2017-12-09 (×2): qty 2

## 2017-12-09 MED ORDER — METOPROLOL TARTRATE 50 MG PO TABS: 50.0000 mg | ORAL_TABLET | Freq: Two times a day (BID) | ORAL | Status: DC

## 2017-12-09 MED ORDER — SODIUM CHLORIDE 0.9 % IV BOLUS
500.0000 mL | Freq: Once | INTRAVENOUS | Status: AC
  Administered 2017-12-09: 500 mL via INTRAVENOUS

## 2017-12-09 MED ORDER — CLARITHROMYCIN 500 MG PO TABS
500.0000 mg | ORAL_TABLET | Freq: Two times a day (BID) | ORAL | Status: DC
  Administered 2017-12-09 – 2017-12-11 (×6): 500 mg
  Filled 2017-12-09 (×8): qty 1

## 2017-12-09 MED ORDER — PANTOPRAZOLE SODIUM 40 MG PO PACK
40.0000 mg | PACK | Freq: Two times a day (BID) | ORAL | Status: AC
  Administered 2017-12-09 – 2017-12-13 (×10): 40 mg
  Filled 2017-12-09 (×10): qty 20

## 2017-12-09 MED ORDER — IPRATROPIUM-ALBUTEROL 0.5-2.5 (3) MG/3ML IN SOLN
3.0000 mL | RESPIRATORY_TRACT | Status: DC | PRN
  Administered 2017-12-09: 3 mL via RESPIRATORY_TRACT

## 2017-12-09 MED ORDER — POTASSIUM & SODIUM PHOSPHATES 280-160-250 MG PO PACK
2.0000 | PACK | Freq: Three times a day (TID) | ORAL | Status: AC
  Administered 2017-12-09 (×3): 2
  Filled 2017-12-09 (×3): qty 2

## 2017-12-09 MED ORDER — AMLODIPINE BESYLATE 10 MG PO TABS
10.0000 mg | ORAL_TABLET | Freq: Every day | ORAL | Status: DC
  Administered 2017-12-09 – 2017-12-19 (×4): 10 mg
  Filled 2017-12-09 (×8): qty 1

## 2017-12-09 MED ORDER — PHENYLEPHRINE HCL 10 MG/ML IJ SOLN
0.0000 ug/min | INTRAMUSCULAR | Status: DC
  Administered 2017-12-09: 40 ug/min via INTRAVENOUS
  Administered 2017-12-10: 15 ug/min via INTRAVENOUS
  Administered 2017-12-12: 30 ug/min via INTRAVENOUS
  Filled 2017-12-09 (×3): qty 4
  Filled 2017-12-09: qty 40
  Filled 2017-12-09: qty 4
  Filled 2017-12-09: qty 40

## 2017-12-09 MED ORDER — HYDRALAZINE HCL 50 MG PO TABS
50.0000 mg | ORAL_TABLET | Freq: Three times a day (TID) | ORAL | Status: DC
  Administered 2017-12-09 – 2017-12-19 (×8): 50 mg via ORAL
  Filled 2017-12-09 (×9): qty 1

## 2017-12-09 MED ORDER — ACETAMINOPHEN 650 MG RE SUPP: 650.0000 mg | Freq: Four times a day (QID) | RECTAL | Status: DC | PRN

## 2017-12-09 MED ORDER — AMOXICILLIN 250 MG/5ML PO SUSR
1000.0000 mg | Freq: Two times a day (BID) | ORAL | Status: DC
  Administered 2017-12-09 – 2017-12-10 (×4): 1000 mg
  Filled 2017-12-09 (×7): qty 20

## 2017-12-09 MED ORDER — FREE WATER
200.0000 mL | Status: DC
  Administered 2017-12-09 – 2017-12-21 (×49): 200 mL

## 2017-12-09 MED ORDER — TRAZODONE HCL 100 MG PO TABS: 100.0000 mg | ORAL_TABLET | Freq: Every evening | ORAL | Status: DC | PRN

## 2017-12-09 NOTE — Progress Notes (Signed)
BL heel red and blanching. Pink foam placed on BL heels.

## 2017-12-09 NOTE — Progress Notes (Signed)
Pharmacy notified for potassium 39meq packets. Not in pyxis or patients drawer

## 2017-12-09 NOTE — Care Management (Signed)
NG placed Friday for medications as at that time patient was NPO. Patient has not progressed due to agitation and desaturation O2. Palliative consult pending.

## 2017-12-09 NOTE — Code Documentation (Signed)
At 1831 hours, this patient's son called out for assistance. He said that his father's "blue number" (SpO2) was dropping. Patient was found to be unresponsive, cyanotic, and agonal respirations. Code blue was immediately activated and and patient was bagged. Pulses were lost at 1832 hours. CPR was immediately started. Patient was placed on Zoll monitor. See written code blue report for further details.

## 2017-12-09 NOTE — Progress Notes (Signed)
*  PRELIMINARY RESULTS* Echocardiogram 2D Echocardiogram has been performed.  Marc [name] 12/09/2017, 7:47 AM

## 2017-12-09 NOTE — ED Provider Notes (Signed)
[facility]  Department of Emergency Medicine   Code Blue CONSULT NOTE  Chief Complaint: Cardiac arrest/unresponsive   Level V Caveat: Unresponsive  History of present illness: I was contacted by the hospital for a CODE BLUE cardiac arrest upstairs and presented to the patient's bedside. The patient had been admitted and had been intubated. Per report extubated yesterday. Nursing states patient became bradycardic and stopped breathing.   ROS: Unable to obtain, Level V caveat  Scheduled Meds: . amLODipine  10 mg Per Tube Daily  . amoxicillin  1,000 mg Per Tube Q12H  . aspirin  81 mg Per Tube Daily  . chlorhexidine  15 mL Mouth Rinse BID  . clarithromycin  500 mg Per Tube Q12H  . docusate  100 mg Per Tube BID  . feeding supplement (PRO-STAT SUGAR FREE 64)  30 mL Per Tube Daily  . free water  200 mL Per Tube Q4H  . hydrALAZINE  50 mg Oral Q8H  . insulin aspart  0-20 Units Subcutaneous Q4H  . insulin glargine  10 Units Subcutaneous QHS  . mouth rinse  15 mL Mouth Rinse q12n4p  . metoprolol tartrate  50 mg Per Tube BID  . multivitamin  15 mL Per Tube Daily  . pantoprazole sodium  40 mg Per Tube BID  . potassium & sodium phosphates  2 packet Per Tube TID  . sodium chloride flush  10-40 mL Intracatheter Q12H   Continuous Infusions: . sodium chloride 10 mL/hr at 12/09/17 1800  . feeding supplement (VITAL 1.5 CAL) Stopped (12/09/17 1835)  . heparin 1,100 Units/hr (12/09/17 1800)  . norepinephrine (LEVOPHED) Adult infusion Stopped (12/09/17 2008)  . phenylephrine (NEO-SYNEPHRINE) Adult infusion 45 mcg/min (12/09/17 2032)   PRN Meds:.sodium chloride, acetaminophen **OR** acetaminophen, bisacodyl, ipratropium-albuterol, labetalol, lip balm, phenol, sodium chloride flush, traZODone Past Medical History:  Diagnosis Date  . Anxiety   . BPH (benign prostatic hyperplasia)   . Colon adenomas    history of  . Diverticulosis   . Hyperlipidemia   . Hypertension    Past  Surgical History:  Procedure Laterality Date  . CHOLECYSTECTOMY    . COLONOSCOPY WITH PROPOFOL N/A 01/23/2016   Procedure: COLONOSCOPY WITH PROPOFOL;  Surgeon: Manya Silvas, MD;  Location: [facility] ENDOSCOPY;  Service: Endoscopy;  Laterality: N/A;  . ESOPHAGOGASTRODUODENOSCOPY (EGD) WITH PROPOFOL N/A 11/27/2017   Procedure: ESOPHAGOGASTRODUODENOSCOPY (EGD) WITH PROPOFOL;  Surgeon: Manya Silvas, MD;  Location[facility] ENDOSCOPY;  Service: Endoscopy;  Laterality: N/A;   Social History   Socioeconomic History  . Marital status: Married    Spouse name: Not on file  . Number of children: Not on file  . Years of education: Not on file  . Highest education level: Not on file  Occupational History  . Not on file  Social Needs  . Financial resource strain: Not on file  . Food insecurity:    Worry: Not on file    Inability: Not on file  . Transportation needs:    Medical: Not on file    Non-medical: Not on file  Tobacco Use  . Smoking status: Never Smoker  . Smokeless tobacco: Never Used  Substance and Sexual Activity  . Alcohol use: No  . Drug use: No  . Sexual activity: Not on file  Lifestyle  . Physical activity:    Days per week: Not on file    Minutes per session: Not on file  . Stress: Not on file  Relationships  . Social connections:  Talks on phone: Not on file    Gets together: Not on file    Attends religious service: Not on file    Active member of club or organization: Not on file    Attends meetings of clubs or organizations: Not on file    Relationship status: Not on file  . Intimate partner violence:    Fear of current or ex partner: Not on file    Emotionally abused: Not on file    Physically abused: Not on file    Forced sexual activity: Not on file  Other Topics Concern  . Not on file  Social History Narrative  . Not on file   Allergies  Allergen Reactions  . Glimepiride   . Tape Itching and Rash    Last set of Vital Signs (not current) Vitals:    12/09/17 1800 12/09/17 1900  BP: (!) 142/71 (!) 70/52  Pulse: (!) 101 (!) 43  Resp: 19 (!) 21  Temp:    SpO2: 96% 93%      Physical Exam  Gen: unresponsive Cardiovascular: pulseless  Resp: apneic. Breath sounds equal bilaterally with bagging  Abd: nondistended  Neuro: GCS 3, unresponsive to pain  HEENT: No blood in posterior pharynx, gag reflex absent  Musculoskeletal: No deformity  Skin: warm  Procedures  INTUBATION Performed by: Nance Pear Required items: required blood products, implants, devices, and special equipment available Patient identity confirmed: provided demographic data and hospital-assigned identification number Time out: Immediately prior to procedure a "time out" was called to verify the correct patient, procedure, equipment, support staff and site/side marked as required. Indications: cardiopulmonary arrest Intubation method: glidescope go Preoxygenation: BVM Sedatives: none Paralytic: none Tube Size: 8-0 cuffed Post-procedure assessment: chest rise and ETCO2 monitor Breath sounds: equal and absent over the epigastrium Tube secured by Respiratory Therapy Patient tolerated the procedure well with no immediate complications.  CRITICAL CARE Performed by: Nance Pear Total critical care time: 25 Critical care time was exclusive of separately billable procedures and treating other patients. Critical care was necessary to treat or prevent imminent or life-threatening deterioration. Critical care was time spent personally by me on the following activities: development of treatment plan with patient and/or surrogate as well as nursing, discussions with consultants, evaluation of patient's response to treatment, examination of patient, obtaining history from patient or surrogate, ordering and performing treatments and interventions, ordering and review of laboratory studies, ordering and review of radiographic studies, pulse oximetry and re-evaluation  of patient's condition.  Cardiopulmonary Resuscitation (CPR) Procedure Note  Directed/Performed by: Nance Pear I personally directed ancillary staff and/or performed CPR in an effort to regain return of spontaneous circulation and to maintain cardiac, neuro and systemic perfusion.    Medical Decision making  Was called to the ICU for cardiac arrest.  Intubated the patient.  Please see nursing documentation for exact medications given.  After number of rounds patient did regain spontaneous circulation.     Nance Pear, MD 12/09/17 2237

## 2017-12-09 NOTE — Progress Notes (Signed)
CRITICAL CARE NOTE  CC  follow up generalized weakness/dizziness with respiratory failure  SUBJECTIVE  77 year old male with acute encephalopathy secondary to viral syndrome with sepsis and pneumonia. He was admitted on 8/2 and intubated on 8/3 for acute respiratory failure. Extubated on 8/8. Patient has been off and on BiPAP this weekend with episodes of hypoxia. He continues to have an altered mental status. He is lethargic this morning, with report that he is not sleeping well at night. He is on 2L of [place] this morning, with O2 sat of 95%.    SIGNIFICANT EVENTS -placed on nicardipine drip over the weekend for hypertension. Titrated off -developed a fib this weekend with elevated troponin   BP (!) 141/75   Pulse 87   Temp 99.7 F (37.6 C) (Axillary)   Resp 12   Ht 5\' 9"  (1.753 m)   Wt 60.4 kg   SpO2 99%   BMI 19.66 kg/m    REVIEW OF SYSTEMS  PATIENT IS UNABLE TO PROVIDE COMPLETE REVIEW OF SYSTEM S DUE TO  ENCEPHALOPATHY   PHYSICAL EXAMINATION:  GENERAL: elderly, thin male. Resting in hospital bed HEAD: Normocephalic, atraumatic.  EYES: Pupils equal, round, reactive to light.  No scleral icterus.  MOUTH: Moist mucosal membrane. NECK: Supple. No thyromegaly. No nodules. No JVD.  PULMONARY: clear to auscultation bilaterally CARDIOVASCULAR: S1 and S2. Regular rate and rhythm. No murmurs, rubs, or gallops.  GASTROINTESTINAL: Soft, nontender, -distended. No masses. Positive bowel sounds. No hepatosplenomegaly.  MUSCULOSKELETAL: No swelling, clubbing, or edema.  NEUROLOGIC: lethargic, oriented to name.  SKIN:intact,warm,dry. Bilateral heals with blanching erythema; pink foam pads placed  ASSESSMENT AND PLAN SYNOPSIS  77 year old male with acute encephalopathy secondary to viral syndrome with sepsis and pneumonia. Patient with weight loss and barrett's esophagus with h.pylori prior to admission.  Will order palliative consult as patient has been declining over the past few  months.    Severe Hypoxic and hypercapnic Respiratory Failure with sepsis and pneumonia -continue 2L of nasal cannula -continue BiPAP as needed -CXR: shows bibasilar atelectasis vs. effusion -ABG yesterday showed pH of 7.29 with hypercapnia (85)   Gastrointestinal: -h. Pylori: continue triple therapy (clarithromycin, amoxil, pantoprazole) -tube feeds until cleared by speech therapy  -unable to reassess this weekend secondary to BiPAP  Renal: -hypernatremia resolved -electrolytes stable -monitor creatinine   NEUROLOGY -acute encephalopathy secondary to ?viral syndrome -ICU related delirium -LP, MRI, EEG negative -start trazodone QHS   CARDIAC -new onset atrial fibrillation, controlled  -appreciate cardiology input  -heparin drip; rate control with metoprolol -hypertensive emergency:  -nicardipine drip titrated off  -controlled this weekend, continue hydralazine, amlodipine, PRN labetalol  -elevated troponin (0.69-->0.98-->0.52): likely demand ischemia -nonocclusive DVT of right subclavian with occlusive SVT of cephalic vein  -continue heparin -echo 8/11: pending results  ID -afebrile -leukocytosis resolved (5.3)  Diabetes Mellitus: -continue SSI and lantus QHS -continue to monitor CBG   DVT; heparin GI PRX: pantoprazole  TRANSFUSIONS AS NEEDED MONITOR FSBS ASSESS the need for LABS as needed   Critical Care Time devoted to patient care services described in this note is 32 minutes.   Overall, patient is critically ill, prognosis is guarded.  Patient with Multiorgan failure and at high risk for cardiac arrest and death.

## 2017-12-09 NOTE — Progress Notes (Signed)
ANTICOAGULATION CONSULT NOTE  Pharmacy Consult for heparin drip management  Indication: DVT  Patient Measurements: Height: 5\' 9"  (175.3 cm) Weight: 120 lb 13 oz (54.8 kg) IBW/kg (Calculated) : 70.7   Vital Signs: Temp: 99.7 F (37.6 C) (08/12 0315) Temp Source: Axillary (08/12 0315) BP: 117/55 (08/12 0500) Pulse Rate: 87 (08/12 0500)  Labs: Recent Labs    12/06/17 2035  12/07/17 0526 12/07/17 1648 12/08/17 0601 12/08/17 1020 12/08/17 2245 12/09/17 0527  HGB  --    < > 13.9  --  14.1  --   --  11.5*  HCT  --   --  41.5  --  42.1  --   --  34.6*  PLT  --   --  198  --  212  --   --  181  HEPARINUNFRC  --   --  0.44  --  0.46  --   --  0.37  CREATININE 1.56*  --  1.31* 1.01 1.12  --  1.01  --   CKMB  --   --   --   --   --  2.2  --   --   TROPONINI 0.69*  --   --  0.98*  --  0.52*  --   --    < > = values in this interval not displayed.    Estimated Creatinine Clearance: 49 mL/min (by C-G formula based on SCr of 1.01 mg/dL).  Medical History: Past Medical History:  Diagnosis Date  . Anxiety   . BPH (benign prostatic hyperplasia)   . Colon adenomas    history of  . Diverticulosis   . Hyperlipidemia   . Hypertension    Assessment: Pharmacy consulted for heparin drip management for 77 yo male admitted with AMS and being treated for DVT of right upper extremety. Patient remains sedated and requiring mechanical ventilation. 8/8 AM heparin level 0.29. Patient was given 900 unit bolus and rate was increased to 1100 units/hr.   Goal of Therapy:  Heparin level 0.3-0.7 units/ml Monitor platelets by anticoagulation protocol: Yes   08/08 1430 HL therapeutic @ 0.47. Will continue with current heparin rate of 1100 units/hr. Will recheck confirmatory level in 8 hours. CBC with AM labs per protocol.   Plan:   8/8: HL @ 2118 = 0.35 Will continue pt on current rate and recheck HL on 8/9 with AM labs.   8/9 AM heparin level 0.43. Continue current regimen. Recheck heparin  level and CBC with tomorrow AM labs.  8/10 AM heparin level 0.44. Continue current regimen. Recheck heparin level and CBC with tomorrow AM labs.  8/11 AM heparin level 0.46. Continue current regimen. Recheck heparin level and CBC with tomorrow AM labs.  8/12 AM heparin level 0.37. Continue current regimen. Recheck heparin level and CBC with tomorrow AM labs.    Pharmacy will continue to monitor and adjust per consult.   Eloise Harman, PharmD Clinical Pharmacist 12/09/2017 5:59 AM

## 2017-12-09 NOTE — Progress Notes (Signed)
Palliative Medicine Consult Order Noted. Due to high referral volume and limited staffing, there will be a delay seeing this patient. Palliative Medicine Provider is expected to see this patient 12/10/17. Please call the Palliative Medicine Team office at [phone] if recommendations are needed in the interim.  Thank you for inviting [place] to see this patient.  Marjie Skiff Artis Beggs, RN, BSN, [facility] Palliative Medicine Team 12/09/2017 3:26 PM Office [phone]

## 2017-12-09 NOTE — Progress Notes (Signed)
Pharmacy notified for potassium 76meq packets. Not in pyxis or patients drawer.

## 2017-12-09 NOTE — Progress Notes (Signed)
At 1831, pt with agonal respirations, cyanosis, and eventual respiratory arrest leading to cardiac arrest.  Code blue called at [place], with initiation of CPR.  Pt received multiple rounds of epinephrine, one dose bicarb.  Pt was intubated during the code by ER physician.  ROSC obtained after approximately 5-6 minutes, see Code Blue report for full details.  Spoke with pt's son regarding events leading up to cardiac arrest.  He reports that his father was having trouble coughing up secretions and feels that secretions may have blocked his airway, then his dad stopped breathing.  Discussed code status with pt's son and pt's wife.  At this time they would like to continue with FULL CODE.  Dr. Alva Garnet currently at bedside having discussion with pt's family.   Darel Hong, AGACNP-BC [facility] Pager: [phone]

## 2017-12-09 NOTE — Progress Notes (Signed)
SLP Cancellation Note  Patient Details Name: Marc [name] MRN: [id number] DOB: [birth year]   Cancelled treatment:       Reason Eval/Treat Not Completed: Fatigue/lethargy limiting ability to participate;Patient's level of consciousness  Attempted to see patient for trials PO's. Patient very lethargic, able to open eyes briefly when spoken to; however, unable to stay alert enough for PO trials. Noted patient attempting to protrude tongue when requested by SLP; however tongue moves anteriorally very minimially. Will reattempt trial PO's when appropriate.    Elesa Garman, MA, CCC-SLP 12/09/2017, 11:30 AM

## 2017-12-09 NOTE — Progress Notes (Signed)
[place] [facility] [place] NAME: Marc [name]    MR#:  [id number]  DATE OF BIRTH:  12-12-1940  SUBJECTIVE:  CHIEF COMPLAINT:   Chief Complaint  Patient presents with  . Weakness  . Dizziness   -Patient is extubated, remains on nasal cannula.  Very labile blood pressures all night. -Failed swallow eval, has an NG tube in  REVIEW OF SYSTEMS:  Review of Systems  Constitutional: Negative for chills, fever and malaise/fatigue.  HENT: Negative for ear discharge, hearing loss and nosebleeds.   Eyes: Negative for blurred vision and double vision.  Respiratory: Positive for shortness of breath. Negative for cough and wheezing.   Cardiovascular: Negative for chest pain and palpitations.  Gastrointestinal: Negative for abdominal pain, constipation, diarrhea, nausea and vomiting.  Genitourinary: Negative for dysuria.  Musculoskeletal: Negative for myalgias.  Neurological: Negative for dizziness, focal weakness, seizures, weakness and headaches.  Psychiatric/Behavioral: Negative for depression.    DRUG ALLERGIES:   Allergies  Allergen Reactions  . Glimepiride   . Tape Itching and Rash    VITALS:  Blood pressure (!) 141/75, pulse 87, temperature 99.7 F (37.6 C), temperature source Axillary, resp. rate 12, height 5\' 9"  (1.753 m), weight 60.4 kg, SpO2 97 %.  PHYSICAL EXAMINATION:  Physical Exam   GENERAL:  77 y.o.-year-old patient lying in the bed, ill-appearing, however with no acute distress.  EYES: Pupils equal, round, reactive to light and accommodation. No scleral icterus. Extraocular muscles intact.  HEENT: Head atraumatic, normocephalic. Oropharynx and nasopharynx clear.  NECK:  Supple, no jugular venous distention. No thyroid enlargement, no tenderness.  LUNGS: Normal breath sounds bilaterally, no wheezing, rales  or crepitation. No use of accessory muscles of respiration.  Bibasilar rhonchi heard. CARDIOVASCULAR: S1, S2 normal. No   rubs, or gallops.  2/6 systolic murmur is present ABDOMEN: Soft, nontender, nondistended. Bowel sounds present. No organomegaly or mass.  EXTREMITIES: No pedal edema, cyanosis, or clubbing.  NEUROLOGIC: Cranial nerves II through XII are intact. Muscle strength 4/5 in all extremities.  Global weakness noted.  Sensation intact. Gait not checked.  PSYCHIATRIC: The patient is alert and oriented x 2.  SKIN: No obvious rash, lesion, or ulcer.    LABORATORY PANEL:   CBC Recent Labs  Lab 12/09/17 0527  WBC 5.3  HGB 11.5*  HCT 34.6*  PLT 181   ------------------------------------------------------------------------------------------------------------------  Chemistries  Recent Labs  Lab 12/06/17 2035  12/09/17 0527  NA 148*   < > 144  K 4.0   < > 4.3  CL 106   < > 105  CO2 33*   < > 34*  GLUCOSE 253*   < > 159*  BUN 46*   < > 32*  CREATININE 1.56*   < > 1.23  CALCIUM 8.9   < > 8.1*  MG 1.9   < > 2.0  AST 40  --   --   ALT 31  --   --   ALKPHOS 68  --   --   BILITOT 0.7  --   --    < > = values in this interval not displayed.   ------------------------------------------------------------------------------------------------------------------  Cardiac Enzymes Recent Labs  Lab 12/08/17 1020  TROPONINI 0.52*   ------------------------------------------------------------------------------------------------------------------  RADIOLOGY:  Dg Chest Port 1 View  Result Date: 12/09/2017 CLINICAL DATA:  Atelectasis EXAM: PORTABLE CHEST 1 VIEW COMPARISON:  December 08, 2017 FINDINGS: Central catheter tip is in the right atrium. Nasogastric tube tip and side port are  below the diaphragm. No pneumothorax. There are small pleural effusions bilaterally with mild bibasilar atelectatic change. There is no frank edema or consolidation. Heart is upper normal in size with pulmonary vascularity normal. No adenopathy. No bone lesions. IMPRESSION: Tube and catheter positions as described without  pneumothorax. Small pleural effusions bilaterally with mild bibasilar atelectasis. No consolidation. Stable cardiac silhouette. Electronically Signed   By: Lowella Grip III M.D.   On: 12/09/2017 07:15   Dg Chest Port 1 View  Result Date: 12/08/2017 CLINICAL DATA:  Atelectasis EXAM: PORTABLE CHEST 1 VIEW COMPARISON:  Two days ago FINDINGS: An orogastric tube and side-port reaches the stomach which appears decompressed. Right upper extremity PICC in good position. Artifact from EKG leads. Low volume chest with hazy opacities at both bases. On chest CT 11/30/2017 there is atelectasis at the bases. No Kerley lines or pneumothorax. Normal heart size for technique. IMPRESSION: Stable atelectasis with possible small volume pleural fluid at the bases. Electronically Signed   By: Monte Fantasia M.D.   On: 12/08/2017 07:13    EKG:   Orders placed or performed during the hospital encounter of 11/29/17  . ED EKG  . ED EKG  . EKG 12-Lead  . EKG 12-Lead  . EKG 12-Lead  . EKG 12-Lead  . EKG 12-Lead  . EKG 12-Lead    ASSESSMENT AND PLAN:   77 year old male with past medical history significant for hypertension, hyperlipidemia and recent diagnosis of H. pylori gastritis on 11/27/2017 presents to hospital from home secondary to weakness, confusion.  1.  Acute respiratory failure-secondary to possible aspiration pneumonia.   -Was intubated initially, in the ICU.  Extubated now -Off antibiotics.  Speech consult is pending -NG tube currently for feeding and meds  2.  Acute encephalopathy-appreciate neurology consult.  Considered to be secondary to PRES syndrome -Blood pressure is labile, monitor -MRI of the brain showing mild volume loss and chronic ischemic changes, no acute findings. -Mentation is slowly improving -Lumbar puncture was negative  3.  H. pylori gastritis-diagnosed with biopsy from recent endoscopy about 2 weeks ago. -Continue treatment with Protonix, amoxicillin and  clarithromycin  4.  Hypertension-labile blood pressures. -Continue metoprolol and Norvasc for now and monitor  5.  Right upper extremity DVT-has a PICC line currently in that arm. -Nonocclusive DVT in the right subclavian and an occlusive DVT in the right cephalic vein seen. -Continue IV heparin drip for now   Physical therapy consult, once transferred out of ICU    All the records are reviewed and case discussed with Care Management/Social Workerr. Management plans discussed with the patient, family and they are in agreement.  CODE STATUS: Full Code  TOTAL TIME TAKING CARE OF THIS PATIENT: 38 minutes.   POSSIBLE D/C IN 2-3 DAYS, DEPENDING ON CLINICAL CONDITION.   Gladstone Lighter M.D on 12/09/2017 at 9:55 AM  Between 7am to 6pm - Pager - [phone]  After 6pm go to [email or web address] - password [facility]  Office  [phone]  CC: Primary care physician; Tracie Harrier, MD

## 2017-12-09 NOTE — Progress Notes (Signed)
BP 105/60 HR 107; Ho;d this scheduled dose per Burman Nieves, NP

## 2017-12-09 NOTE — Consult Note (Signed)
Reason for Consult:Afib AMS Resp Failure Referring Physician: Dr Gilberto Better icu Cardiologist Paraschos  [place] Marc [name] is an 77 y.o. male.  HPI: pt presents with acute sob and confusion.  He admitted with AMS resp failure and Afib RVR which is new. He denied cp.No obvious signs of infection. He recently saw DrParadchos in tge office  Past Medical History:  Diagnosis Date  . Anxiety   . BPH (benign prostatic hyperplasia)   . Colon adenomas    history of  . Diverticulosis   . Hyperlipidemia   . Hypertension     Past Surgical History:  Procedure Laterality Date  . CHOLECYSTECTOMY    . COLONOSCOPY WITH PROPOFOL N/A 01/23/2016   Procedure: COLONOSCOPY WITH PROPOFOL;  Surgeon: Manya Silvas, MD;  Location: [facility] ENDOSCOPY;  Service: Endoscopy;  Laterality: N/A;  . ESOPHAGOGASTRODUODENOSCOPY (EGD) WITH PROPOFOL N/A 11/27/2017   Procedure: ESOPHAGOGASTRODUODENOSCOPY (EGD) WITH PROPOFOL;  Surgeon: Manya Silvas, MD;  Location: [facility] ENDOSCOPY;  Service: Endoscopy;  Laterality: N/A;    Family History  Problem Relation Age of Onset  . Hypertension Other   . Diabetes Neg Hx     Social History:  reports that he has never smoked. He has never used smokeless tobacco. He reports that he does not drink alcohol or use drugs.  Allergies:  Allergies  Allergen Reactions  . Glimepiride   . Tape Itching and Rash    Medications: I have reviewed the patient's current medications.  Results for orders placed or performed during the hospital encounter of 11/29/17 (from the past 48 hour(s))  Glucose, capillary     Status: Abnormal   Collection Time: 12/07/17  7:57 AM  Result Value Ref Range   Glucose-Capillary 194 (H) 70 - 99 mg/dL   Comment 1 Notify RN   Glucose, capillary     Status: Abnormal   Collection Time: 12/07/17 11:52 AM  Result Value Ref Range   Glucose-Capillary 256 (H) 70 - 99 mg/dL  Blood gas, arterial     Status: Abnormal   Collection Time: 12/07/17  2:29 PM  Result Value Ref  Range   FIO2 0.30    Delivery systems BILEVEL POSITIVE AIRWAY PRESSURE    Inspiratory PAP 18    Expiratory PAP 8.0    pH, Arterial 7.42 7.350 - 7.450   pCO2 arterial 58 (H) 32.0 - 48.0 mmHg   pO2, Arterial 76 (L) 83.0 - 108.0 mmHg   Bicarbonate 35.1 (H) 20.0 - 28.0 mmol/L   Acid-Base Excess 7.9 (H) 0.0 - 2.0 mmol/L   O2 Saturation 96.4 %   Patient temperature 35.1    Collection site RIGHT RADIAL    Sample type ARTERIAL DRAW    Allens test (pass/fail) PASS PASS    Comment: Performed at [facility], [address] [place], [place] [address]  Glucose, capillary     Status: Abnormal   Collection Time: 12/07/17  4:05 PM  Result Value Ref Range   Glucose-Capillary 129 (H) 70 - 99 mg/dL  Basic metabolic panel     Status: Abnormal   Collection Time: 12/07/17  4:48 PM  Result Value Ref Range   Sodium 146 (H) 135 - 145 mmol/L   Potassium 3.5 3.5 - 5.1 mmol/L   Chloride 105 98 - 111 mmol/L   CO2 34 (H) 22 - 32 mmol/L   Glucose, Bld 124 (H) 70 - 99 mg/dL   BUN 39 (H) 8 - 23 mg/dL   Creatinine, Ser 1.01 0.61 - 1.24 mg/dL  Calcium 7.9 (L) 8.9 - 10.3 mg/dL   GFR calc non Af Amer >60 >60 mL/min   GFR calc Af Amer >60 >60 mL/min    Comment: (NOTE) The eGFR has been calculated using the CKD EPI equation. This calculation has not been validated in all clinical situations. eGFR's persistently <60 mL/min signify possible Chronic Kidney Disease.    Anion gap 7 5 - 15    Comment: Performed at [facility], [address] [place], [place] [address]  Troponin I     Status: Abnormal   Collection Time: 12/07/17  4:48 PM  Result Value Ref Range   Troponin I 0.98 (HH) <0.03 ng/mL    Comment: CRITICAL VALUE NOTED. VALUE IS CONSISTENT WITH PREVIOUSLY REPORTED/CALLED VALUE / [place] Performed at [facility], [address] [place], [place] [address]   Glucose, capillary     Status: Abnormal   Collection Time: 12/07/17  7:39 PM  Result Value Ref Range    Glucose-Capillary 174 (H) 70 - 99 mg/dL   Comment 1 Notify RN    Comment 2 Document in Chart   Glucose, capillary     Status: Abnormal   Collection Time: 12/07/17  9:05 PM  Result Value Ref Range   Glucose-Capillary 202 (H) 70 - 99 mg/dL  Glucose, capillary     Status: Abnormal   Collection Time: 12/08/17 12:52 AM  Result Value Ref Range   Glucose-Capillary 118 (H) 70 - 99 mg/dL   Comment 1 Notify RN    Comment 2 Document in Chart   Glucose, capillary     Status: Abnormal   Collection Time: 12/08/17  3:52 AM  Result Value Ref Range   Glucose-Capillary 187 (H) 70 - 99 mg/dL   Comment 1 Notify RN    Comment 2 Document in Chart   Heparin level (unfractionated)     Status: None   Collection Time: 12/08/17  6:01 AM  Result Value Ref Range   Heparin Unfractionated 0.46 0.30 - 0.70 IU/mL    Comment: (NOTE) If heparin results are below expected values, and patient dosage has  been confirmed, suggest follow up testing of antithrombin III levels. Performed at [facility], [address] [place], [place] [address]   Basic metabolic panel     Status: Abnormal   Collection Time: 12/08/17  6:01 AM  Result Value Ref Range   Sodium 150 (H) 135 - 145 mmol/L   Potassium 3.2 (L) 3.5 - 5.1 mmol/L   Chloride 104 98 - 111 mmol/L   CO2 35 (H) 22 - 32 mmol/L   Glucose, Bld 224 (H) 70 - 99 mg/dL   BUN 33 (H) 8 - 23 mg/dL   Creatinine, Ser 1.12 0.61 - 1.24 mg/dL   Calcium 8.5 (L) 8.9 - 10.3 mg/dL   GFR calc non Af Amer >60 >60 mL/min   GFR calc Af Amer >60 >60 mL/min    Comment: (NOTE) The eGFR has been calculated using the CKD EPI equation. This calculation has not been validated in all clinical situations. eGFR's persistently <60 mL/min signify possible Chronic Kidney Disease.    Anion gap 11 5 - 15    Comment: Performed at [facility], [address] [place],  [address]  Magnesium     Status: None   Collection Time: 12/08/17  6:01 AM  Result Value Ref Range    Magnesium 1.8 1.7 - 2.4 mg/dL    Comment: Performed at [facility], [address] [place], [place] [address]  Phosphorus     Status: None   Collection Time: 12/08/17  6:01 AM  Result Value Ref Range   Phosphorus 3.5 2.5 - 4.6 mg/dL    Comment: Performed at [facility], [address] [place], [place] [address]  CBC with Differential/Platelet     Status: Abnormal   Collection Time: 12/08/17  6:01 AM  Result Value Ref Range   WBC 8.1 3.8 - 10.6 K/uL   RBC 4.30 (L) 4.40 - 5.90 MIL/uL   Hemoglobin 14.1 13.0 - 18.0 g/dL   HCT 42.1 40.0 - 52.0 %   MCV 97.7 80.0 - 100.0 fL   MCH 32.8 26.0 - 34.0 pg   MCHC 33.5 32.0 - 36.0 g/dL   RDW 13.7 11.5 - 14.5 %   Platelets 212 150 - 440 K/uL   Neutrophils Relative % 79 %   Neutro Abs 6.4 1.4 - 6.5 K/uL   Lymphocytes Relative 9 %   Lymphs Abs 0.7 (L) 1.0 - 3.6 K/uL   Monocytes Relative 12 %   Monocytes Absolute 1.0 0.2 - 1.0 K/uL   Eosinophils Relative 0 %   Eosinophils Absolute 0.0 0 - 0.7 K/uL   Basophils Relative 0 %   Basophils Absolute 0.0 0 - 0.1 K/uL    Comment: Performed at [facility], [address] [place], [place] [address]  Glucose, capillary     Status: Abnormal   Collection Time: 12/08/17  7:47 AM  Result Value Ref Range   Glucose-Capillary 205 (H) 70 - 99 mg/dL  Troponin I     Status: Abnormal   Collection Time: 12/08/17 10:20 AM  Result Value Ref Range   Troponin I 0.52 (HH) <0.03 ng/mL    Comment: CRITICAL VALUE NOTED. VALUE IS CONSISTENT WITH PREVIOUSLY REPORTED/CALLED VALUE [facility] Performed at [facility], [address] [place], [place] [address]   CKMB([facility] only)     Status: None   Collection Time: 12/08/17 10:20 AM  Result Value Ref Range   CK, MB 2.2 0.5 - 5.0 ng/mL    Comment: Performed at [facility], [address] [place], [place] [address]  Glucose, capillary     Status: Abnormal   Collection Time: 12/08/17 12:02 PM  Result Value Ref Range    Glucose-Capillary 260 (H) 70 - 99 mg/dL  Blood gas, arterial     Status: Abnormal   Collection Time: 12/08/17  4:13 PM  Result Value Ref Range   FIO2 0.30    Delivery systems BILEVEL POSITIVE AIRWAY PRESSURE    Inspiratory PAP 18    Expiratory PAP 8.0    pH, Arterial 7.29 (L) 7.350 - 7.450   pCO2 arterial 85 (HH) 32.0 - 48.0 mmHg    Comment: CRITICAL RESULT CALLED TO, READ BACK BY AND VERIFIED WITH:  DR Soyla Murphy AT 1628, 12/08/2017, SM    pO2, Arterial 88 83.0 - 108.0 mmHg   Bicarbonate 40.9 (H) 20.0 - 28.0 mmol/L   Acid-Base Excess 10.7 (H) 0.0 - 2.0 mmol/L   O2 Saturation 95.7 %   Patient temperature 37.0    Collection site RIGHT RADIAL    Sample type ARTERIAL DRAW    Allens test (pass/fail) PASS PASS    Comment: Performed at [facility], [address] [place], [place] [address]  Glucose, capillary     Status: Abnormal   Collection Time: 12/08/17  4:26 PM  Result Value Ref Range   Glucose-Capillary 65 (L) 70 - 99  mg/dL  Glucose, capillary     Status: Abnormal   Collection Time: 12/08/17  5:19 PM  Result Value Ref Range   Glucose-Capillary 112 (H) 70 - 99 mg/dL  Glucose, capillary     Status: Abnormal   Collection Time: 12/08/17  8:44 PM  Result Value Ref Range   Glucose-Capillary 179 (H) 70 - 99 mg/dL  Basic metabolic panel     Status: Abnormal   Collection Time: 12/08/17 10:45 PM  Result Value Ref Range   Sodium 140 135 - 145 mmol/L   Potassium 3.3 (L) 3.5 - 5.1 mmol/L   Chloride 103 98 - 111 mmol/L   CO2 33 (H) 22 - 32 mmol/L   Glucose, Bld 155 (H) 70 - 99 mg/dL   BUN 31 (H) 8 - 23 mg/dL   Creatinine, Ser 1.01 0.61 - 1.24 mg/dL   Calcium 7.7 (L) 8.9 - 10.3 mg/dL   GFR calc non Af Amer >60 >60 mL/min   GFR calc Af Amer >60 >60 mL/min    Comment: (NOTE) The eGFR has been calculated using the CKD EPI equation. This calculation has not been validated in all clinical situations. eGFR's persistently <60 mL/min signify possible Chronic Kidney Disease.     Anion gap 4 (L) 5 - 15    Comment: Performed at [facility], [address] [place], [place] [address]  Glucose, capillary     Status: Abnormal   Collection Time: 12/08/17 10:54 PM  Result Value Ref Range   Glucose-Capillary 143 (H) 70 - 99 mg/dL  Glucose, capillary     Status: Abnormal   Collection Time: 12/09/17 12:11 AM  Result Value Ref Range   Glucose-Capillary 182 (H) 70 - 99 mg/dL  Glucose, capillary     Status: Abnormal   Collection Time: 12/09/17 12:49 AM  Result Value Ref Range   Glucose-Capillary 190 (H) 70 - 99 mg/dL  Glucose, capillary     Status: Abnormal   Collection Time: 12/09/17  3:35 AM  Result Value Ref Range   Glucose-Capillary 139 (H) 70 - 99 mg/dL  Heparin level (unfractionated)     Status: None   Collection Time: 12/09/17  5:27 AM  Result Value Ref Range   Heparin Unfractionated 0.37 0.30 - 0.70 IU/mL    Comment: (NOTE) If heparin results are below expected values, and patient dosage has  been confirmed, suggest follow up testing of antithrombin III levels. Performed at [facility], [address] [place], [place] [address]   Basic metabolic panel     Status: Abnormal   Collection Time: 12/09/17  5:27 AM  Result Value Ref Range   Sodium 144 135 - 145 mmol/L   Potassium 4.3 3.5 - 5.1 mmol/L   Chloride 105 98 - 111 mmol/L   CO2 34 (H) 22 - 32 mmol/L   Glucose, Bld 159 (H) 70 - 99 mg/dL   BUN 32 (H) 8 - 23 mg/dL   Creatinine, Ser 1.23 0.61 - 1.24 mg/dL   Calcium 8.1 (L) 8.9 - 10.3 mg/dL   GFR calc non Af Amer 56 (L) >60 mL/min   GFR calc Af Amer >60 >60 mL/min    Comment: (NOTE) The eGFR has been calculated using the CKD EPI equation. This calculation has not been validated in all clinical situations. eGFR's persistently <60 mL/min signify possible Chronic Kidney Disease.    Anion gap 5 5 - 15    Comment: Performed at [facility], [place], [place]  [address]  Magnesium     Status: None    Collection Time: 12/09/17  5:27 AM  Result Value Ref Range   Magnesium 2.0 1.7 - 2.4 mg/dL    Comment: Performed at [facility], [address] [place], [place] [address]  Phosphorus     Status: Abnormal   Collection Time: 12/09/17  5:27 AM  Result Value Ref Range   Phosphorus 2.0 (L) 2.5 - 4.6 mg/dL    Comment: Performed at [facility], [address] [place], [place] [address]  CBC with Differential/Platelet     Status: Abnormal   Collection Time: 12/09/17  5:27 AM  Result Value Ref Range   WBC 5.3 3.8 - 10.6 K/uL   RBC 3.55 (L) 4.40 - 5.90 MIL/uL   Hemoglobin 11.5 (L) 13.0 - 18.0 g/dL   HCT 34.6 (L) 40.0 - 52.0 %   MCV 97.5 80.0 - 100.0 fL   MCH 32.5 26.0 - 34.0 pg   MCHC 33.3 32.0 - 36.0 g/dL   RDW 13.4 11.5 - 14.5 %   Platelets 181 150 - 440 K/uL   Neutrophils Relative % 70 %   Neutro Abs 3.8 1.4 - 6.5 K/uL   Lymphocytes Relative 15 %   Lymphs Abs 0.8 (L) 1.0 - 3.6 K/uL   Monocytes Relative 14 %   Monocytes Absolute 0.7 0.2 - 1.0 K/uL   Eosinophils Relative 1 %   Eosinophils Absolute 0.0 0 - 0.7 K/uL   Basophils Relative 0 %   Basophils Absolute 0.0 0 - 0.1 K/uL    Comment: Performed at [facility], [address] [place], [place] [address]  Glucose, capillary     Status: Abnormal   Collection Time: 12/09/17  7:23 AM  Result Value Ref Range   Glucose-Capillary 153 (H) 70 - 99 mg/dL    Dg Chest Port 1 View  Result Date: 12/09/2017 CLINICAL DATA:  Atelectasis EXAM: PORTABLE CHEST 1 VIEW COMPARISON:  December 08, 2017 FINDINGS: Central catheter tip is in the right atrium. Nasogastric tube tip and side port are below the diaphragm. No pneumothorax. There are small pleural effusions bilaterally with mild bibasilar atelectatic change. There is no frank edema or consolidation. Heart is upper normal in size with pulmonary vascularity normal. No adenopathy. No bone lesions. IMPRESSION: Tube and catheter positions as described without pneumothorax.  Small pleural effusions bilaterally with mild bibasilar atelectasis. No consolidation. Stable cardiac silhouette. Electronically Signed   By: Lowella Grip III M.D.   On: 12/09/2017 07:15   Dg Chest Port 1 View  Result Date: 12/08/2017 CLINICAL DATA:  Atelectasis EXAM: PORTABLE CHEST 1 VIEW COMPARISON:  Two days ago FINDINGS: An orogastric tube and side-port reaches the stomach which appears decompressed. Right upper extremity PICC in good position. Artifact from EKG leads. Low volume chest with hazy opacities at both bases. On chest CT 11/30/2017 there is atelectasis at the bases. No Kerley lines or pneumothorax. Normal heart size for technique. IMPRESSION: Stable atelectasis with possible small volume pleural fluid at the bases. Electronically Signed   By: Monte Fantasia M.D.   On: 12/08/2017 07:13    Review of Systems  Constitutional: Positive for diaphoresis and malaise/fatigue.  HENT: Positive for congestion and hearing loss.   Eyes: Positive for blurred vision.  Respiratory: Positive for cough and shortness of breath.   Cardiovascular: Positive for palpitations and PND.  Gastrointestinal: Positive for heartburn.  Genitourinary: Negative.   Musculoskeletal: Positive for falls and myalgias.  Skin: Negative.  Neurological: Positive for dizziness and weakness.  Psychiatric/Behavioral: Positive for hallucinations and memory loss. The patient is nervous/anxious.    Blood pressure 105/60, pulse 87, temperature 99.7 F (37.6 C), temperature source Axillary, resp. rate 12, height 5' 9"  (1.753 m), weight 54.8 kg, SpO2 99 %. Physical Exam  Vitals reviewed. Constitutional: He is oriented to person, place, and time. He appears well-developed and well-nourished.  HENT:  Head: Normocephalic and atraumatic.  Eyes: Pupils are equal, round, and reactive to light. Conjunctivae are normal.  Neck: Normal range of motion. Neck supple.  Cardiovascular: Regular rhythm and normal heart sounds.   Respiratory: He is in respiratory distress.  GI: Soft. Bowel sounds are normal. He exhibits no mass.  Musculoskeletal: Normal range of motion.  Neurological: He is alert and oriented to person, place, and time. He has normal reflexes.  Skin: Skin is warm.    Assessment/Plan: AMS Resp Failure AFIB RVR HTN Weakness Hyperlipidemia . Plan Agree withICU level care Anticougcwith Heparin shortbterm Recommend longterm anticoug Rate control rith b- blocker or ca- blocker resp suppurt with bipap Continue inhalers Conservative cardiac input for now I will discuss case with Dr Ailene Ards D Kadeshia Kasparian 12/09/2017, 7:31 AM

## 2017-12-09 NOTE — Progress Notes (Signed)
   12/09/17 1830  Clinical Encounter Type  Visited With Family  Visit Type Initial   Responded to Code Blue and maintained pastoral presence during treatment.  Provided emotional support to son of patient and later patient's wife and daughter-in-law when they arrived.  Encouraged family to request on-call chaplain if a return visit would be helpful, even during overnight hours.

## 2017-12-09 NOTE — Progress Notes (Signed)
Per Dr Alva Garnet order, the ETT was pulled back 2 cm placing it at 24 at the lip.

## 2017-12-09 NOTE — Progress Notes (Signed)
Pharmacy Electrolyte Monitoring Consult:  Pharmacy consulted to assist in monitoring and replacing electrolytes in this 77 y.o. male admitted on 11/29/2017 with Weakness and Dizziness  Labs:  Sodium (mmol/L)  Date Value  12/09/2017 144   Potassium (mmol/L)  Date Value  12/09/2017 4.3   Magnesium (mg/dL)  Date Value  12/09/2017 2.0   Phosphorus (mg/dL)  Date Value  12/09/2017 2.0 (L)   Calcium (mg/dL)  Date Value  12/09/2017 8.1 (L)   Albumin (g/dL)  Date Value  12/06/2017 2.7 (L)    Assessment/Plan: Phosphorus replacement with Phos-NaK 2 packets VT tid x 3 doses and will f/u AM labs.   Pharmacy will continue to monitor and adjust per consult.   Napoleon Form, PharmD, BCPS Clinical Pharmacist 12/09/2017 1:12 PM

## 2017-12-10 ENCOUNTER — Inpatient Hospital Stay: Payer: PPO

## 2017-12-10 DIAGNOSIS — Z9911 Dependence on respirator [ventilator] status: Secondary | ICD-10-CM

## 2017-12-10 DIAGNOSIS — R490 Dysphonia: Secondary | ICD-10-CM

## 2017-12-10 DIAGNOSIS — R1311 Dysphagia, oral phase: Secondary | ICD-10-CM

## 2017-12-10 LAB — BASIC METABOLIC PANEL
ANION GAP: 5 (ref 5–15)
BUN: 34 mg/dL — AB (ref 8–23)
CO2: 37 mmol/L — ABNORMAL HIGH (ref 22–32)
Calcium: 7.9 mg/dL — ABNORMAL LOW (ref 8.9–10.3)
Chloride: 102 mmol/L (ref 98–111)
Creatinine, Ser: 1.15 mg/dL (ref 0.61–1.24)
GFR calc Af Amer: >60 mL/min (ref >60)
GLUCOSE: 110 mg/dL — AB (ref 70–99)
Potassium: 3.8 mmol/L (ref 3.5–5.1)
Sodium: 144 mmol/L (ref 135–145)

## 2017-12-10 LAB — GLUCOSE, CAPILLARY
GLUCOSE-CAPILLARY: 130 mg/dL — AB (ref 70–99)
GLUCOSE-CAPILLARY: 150 mg/dL — AB (ref 70–99)
GLUCOSE-CAPILLARY: 174 mg/dL — AB (ref 70–99)
Glucose-Capillary: 137 mg/dL — ABNORMAL HIGH (ref 70–99)
Glucose-Capillary: 148 mg/dL — ABNORMAL HIGH (ref 70–99)
Glucose-Capillary: 151 mg/dL — ABNORMAL HIGH (ref 70–99)
Glucose-Capillary: 94 mg/dL (ref 70–99)

## 2017-12-10 LAB — URINALYSIS, COMPLETE (UACMP) WITH MICROSCOPIC
Bilirubin Urine: NEGATIVE
GLUCOSE, UA: NEGATIVE mg/dL
Ketones, ur: NEGATIVE mg/dL
Leukocytes, UA: NEGATIVE
NITRITE: NEGATIVE
PROTEIN: NEGATIVE mg/dL
SPECIFIC GRAVITY, URINE: 1.013 (ref 1.005–1.030)
pH: 6 (ref 5.0–8.0)

## 2017-12-10 LAB — CALCIUM, IONIZED
CALCIUM, IONIZED, SERUM: 4.6 mg/dL (ref 4.5–5.6)
Calcium, Ionized, Serum: 4.6 mg/dL (ref 4.5–5.6)

## 2017-12-10 LAB — CBC
HCT: 34.8 % — ABNORMAL LOW (ref 40.0–52.0)
Hemoglobin: 11.9 g/dL — ABNORMAL LOW (ref 13.0–18.0)
MCH: 32.9 pg (ref 26.0–34.0)
MCHC: 34.1 g/dL (ref 32.0–36.0)
MCV: 96.6 fL (ref 80.0–100.0)
Platelets: 236 10*3/uL (ref 150–440)
RBC: 3.6 MIL/uL — ABNORMAL LOW (ref 4.40–5.90)
RDW: 13.2 % (ref 11.5–14.5)
WBC: 14.3 10*3/uL — AB (ref 3.8–10.6)

## 2017-12-10 LAB — PHOSPHORUS: Phosphorus: 3.7 mg/dL (ref 2.5–4.6)

## 2017-12-10 LAB — TROPONIN I: TROPONIN I: 1.93 ng/mL — AB (ref <0.03)

## 2017-12-10 LAB — HEPARIN LEVEL (UNFRACTIONATED): Heparin Unfractionated: 0.52 IU/mL (ref 0.30–0.70)

## 2017-12-10 LAB — TRIGLYCERIDES: Triglycerides: 113 mg/dL (ref <150)

## 2017-12-10 MED ORDER — PROPOFOL 1000 MG/100ML IV EMUL
0.0000 ug/kg/min | INTRAVENOUS | Status: DC
  Administered 2017-12-10 (×2): 15 ug/kg/min via INTRAVENOUS
  Administered 2017-12-11: 30 ug/kg/min via INTRAVENOUS
  Administered 2017-12-11: 40 ug/kg/min via INTRAVENOUS
  Administered 2017-12-11: 20 ug/kg/min via INTRAVENOUS
  Administered 2017-12-12: 40 ug/kg/min via INTRAVENOUS
  Administered 2017-12-12 (×2): 35 ug/kg/min via INTRAVENOUS
  Administered 2017-12-12: 30 ug/kg/min via INTRAVENOUS
  Administered 2017-12-13: 45 ug/kg/min via INTRAVENOUS
  Administered 2017-12-13: 40 ug/kg/min via INTRAVENOUS
  Administered 2017-12-13: 45 ug/kg/min via INTRAVENOUS
  Administered 2017-12-13: 40 ug/kg/min via INTRAVENOUS
  Administered 2017-12-14: 30 ug/kg/min via INTRAVENOUS
  Filled 2017-12-10 (×13): qty 100

## 2017-12-10 MED ORDER — CHLORHEXIDINE GLUCONATE 0.12% ORAL RINSE (MEDLINE KIT)
15.0000 mL | Freq: Two times a day (BID) | OROMUCOSAL | Status: DC
  Administered 2017-12-10 – 2017-12-20 (×21): 15 mL via OROMUCOSAL

## 2017-12-10 MED ORDER — ORAL CARE MOUTH RINSE
15.0000 mL | OROMUCOSAL | Status: DC
  Administered 2017-12-10 – 2017-12-20 (×91): 15 mL via OROMUCOSAL

## 2017-12-10 MED ORDER — VITAL AF 1.2 CAL PO LIQD
1000.0000 mL | ORAL | Status: DC
  Administered 2017-12-10 – 2017-12-16 (×9): 1000 mL

## 2017-12-10 MED ORDER — FENTANYL CITRATE (PF) 100 MCG/2ML IJ SOLN: 50.0000 ug | INTRAMUSCULAR | Status: DC | PRN

## 2017-12-10 MED ORDER — GADOBENATE DIMEGLUMINE 529 MG/ML IV SOLN
15.0000 mL | Freq: Once | INTRAVENOUS | Status: AC | PRN
  Administered 2017-12-10: 12 mL via INTRAVENOUS

## 2017-12-10 MED ORDER — PROPOFOL 1000 MG/100ML IV EMUL
INTRAVENOUS | Status: AC
Start: 2017-12-10 — End: 2017-12-10
  Administered 2017-12-10: 15 ug/kg/min via INTRAVENOUS
  Filled 2017-12-10: qty 100

## 2017-12-10 MED ORDER — FENTANYL CITRATE (PF) 100 MCG/2ML IJ SOLN
50.0000 ug | INTRAMUSCULAR | Status: DC | PRN
  Administered 2017-12-12 – 2017-12-13 (×7): 50 ug via INTRAVENOUS
  Filled 2017-12-10 (×7): qty 2

## 2017-12-10 NOTE — Progress Notes (Signed)
SLP Cancellation Note  Patient Details Name: Marc [name] MRN: [id number] DOB: 1941/04/07   Cancelled treatment:       Reason Eval/Treat Not Completed: Patient not medically ready  Pt has been intubated.   SLP will sign off, RN educated to re consult slp when appropriate.    [place] Sauber 12/10/2017, 2:19 PM

## 2017-12-10 NOTE — Progress Notes (Signed)
CRITICAL CARE NOTE  CC  follow up generalized weakness with respiratory failure  SUBJECTIVE  77 year old male with acute encephalopathy with sepsis and pneumonia. Patient admitted on 8/2 and intubated on 8/3. Extubated on 8/8. Re-intubated yesterday following code blue.   SIGNIFICANT EVENTS  Patient with respiratory arrest leading to cardiac arrest yesterday evening. ROSC achieved. Patient now intubated. Per nursing report, patient with pink frothy secretions after intubation. Respiratory culture ordered. Patient remains on neo-synephrine this morning. Unable to follow commands.   BP 113/66   Pulse (!) 101   Temp 100 F (37.8 C) (Oral)   Resp 16   Ht 5' 9" (1.753 m)   Wt 60.4 kg   SpO2 99%   BMI 19.66 kg/m    REVIEW OF SYSTEMS  PATIENT IS UNABLE TO PROVIDE COMPLETE REVIEW OF SYSTEM S DUE TO SEVERE CRITICAL ILLNESS AND ENCEPHALOPATHY   PHYSICAL EXAMINATION:  GENERAL:critically ill appearing, inbuated HEAD: Normocephalic, atraumatic.  EYES: Pupils equal, round, reactive to light.  No scleral icterus.  MOUTH: Moist mucosal membrane. NECK: Supple. No thyromegaly. No nodules. No JVD.  PULMONARY: clear to auscultation bilaterally, no wheezes/rales/rhonchi CARDIOVASCULAR: S1 and S2. Regular rate and rhythm. No murmurs, rubs, or gallops.  GASTROINTESTINAL: Soft, nontender, -distended. No masses. Positive bowel sounds. No hepatosplenomegaly.  MUSCULOSKELETAL: No swelling, clubbing, or edema.  NEUROLOGIC: obtunded, GCS<8, alert and mildly agitated.  SKIN:intact,warm,dry  ASSESSMENT AND PLAN SYNOPSIS  77 year old male who presented to the hospital for generalized weakness and weight loss. Patient with respiratory arrest leading to cardiac arrest last night. ROSC achieved, patient intubated.  Severe Hypoxic Respiratory Failure -continue Full MV support -continue duoneb PRN -Wean Fio2 and PEEP as tolerated -will perform SBt when respiratory parameters are met -CXR 8/12 post  intubation shows worsening pulmonary edema  -consider lasix   Renal: -no current concerns -creatinine stable -electrolytes stable   NEUROLOGY - intubated without sedation: PRN sedation for agitation - minimal sedation to achieve a RASS goal: -1 -re-evaluation by neurology: LP, MRI, EEG negative during hospitalization  -CT head per neurology as patient is not following commands  -patient with some spots on lumbar spine MRI previously , with recommendation  of repeat MRI in 6 months  -repeat MRI with contrast of lumbar and thoracic spine today  -further workup per neurology   Gastrointestinal: -h. Pylori: continue triple therapy through 8/16  -clarithromycin, amoxil, protonix -continue tube feeds   CARDIAC -wean neo-synephrine as tolerated, MAP goal >65 -DVT and SVT of right upper arm: continue heparin drip -new onset atrial fibrillation: controlled -Echo 8/11: shows EF of 60% without abnormality    ID -leukocytosis: 12.2-->14.3 -tracheal aspirate ordered -urinalysis negative for infection  -culture sent -blood cultures drawn: no growth x 12 hours   DVT: heparin GI PRX: protonix  TRANSFUSIONS AS NEEDED MONITOR FSBS ASSESS the need for LABS as needed   Critical Care Time devoted to patient care services described in this note is 34 minutes.   Overall, patient is critically ill, prognosis is guarded.  Patient with Multiorgan failure and at high risk for cardiac arrest and death.

## 2017-12-10 NOTE — Progress Notes (Signed)
Marc [name] at [facility] NAME: Marc [name]    MR#:  [id number]  DATE OF BIRTH:  05/28/1940  SUBJECTIVE:  CHIEF COMPLAINT:   Chief Complaint  Patient presents with  . Weakness  . Dizziness   -Patient reintubated due to respiratory distress resulting in a CODE BLUE last evening. -Remains on vent, not on sedation this morning and appears very confused and agitated.  Wife at bedside  REVIEW OF SYSTEMS:  Review of Systems  Unable to perform ROS: Critical illness    DRUG ALLERGIES:   Allergies  Allergen Reactions  . Glimepiride   . Tape Itching and Rash    VITALS:  Blood pressure 108/61, pulse 97, temperature 98.6 F (37 C), temperature source Axillary, resp. rate (!) 21, height 5\' 9"  (1.753 m), weight 60.4 kg, SpO2 100 %.  PHYSICAL EXAMINATION:  Physical Exam   GENERAL:  77 y.o.-year-old patient lying in the bed, ill-appearing,  EYES: Pupils equal, round, reactive to light and accommodation. No scleral icterus. Extraocular muscles intact.  HEENT: Head atraumatic, normocephalic. Oropharynx and nasopharynx clear.  NECK:  Supple, no jugular venous distention. No thyroid enlargement, no tenderness.  LUNGS: Normal breath sounds bilaterally, no wheezing, rales  or crepitation. No use of accessory muscles of respiration.  Bibasilar rhonchi heard. CARDIOVASCULAR: S1, S2 normal. No  rubs, or gallops.  2/6 systolic murmur is present ABDOMEN: Soft, nontender, nondistended. Bowel sounds present. No organomegaly or mass.  EXTREMITIES: No pedal edema, cyanosis, or clubbing.  NEUROLOGIC: Alert and confused, using both arms and reaching out for things. PSYCHIATRIC: The patient is alert but very confused SKIN: No obvious rash, lesion, or ulcer.    LABORATORY PANEL:   CBC Recent Labs  Lab 12/10/17 0239  WBC 14.3*  HGB 11.9*  HCT 34.8*  PLT 236    ------------------------------------------------------------------------------------------------------------------  Chemistries  Recent Labs  Lab 12/06/17 2035  12/09/17 2107 12/10/17 0239  NA 148*   < >  --  144  K 4.0   < >  --  3.8  CL 106   < >  --  102  CO2 33*   < >  --  37*  GLUCOSE 253*   < >  --  110*  BUN 46*   < >  --  34*  CREATININE 1.56*   < >  --  1.15  CALCIUM 8.9   < >  --  7.9*  MG 1.9   < > 1.7  --   AST 40  --   --   --   ALT 31  --   --   --   ALKPHOS 68  --   --   --   BILITOT 0.7  --   --   --    < > = values in this interval not displayed.   ------------------------------------------------------------------------------------------------------------------  Cardiac Enzymes Recent Labs  Lab 12/10/17 0239  TROPONINI 1.93*   ------------------------------------------------------------------------------------------------------------------  RADIOLOGY:  Dg Chest Port 1 View  Result Date: 12/09/2017 CLINICAL DATA:  Encounter for intubation EXAM: PORTABLE CHEST 1 VIEW COMPARISON:  12/09/2017 FINDINGS: Endotracheal tube is 2 cm above the carina. Right PICC line tip is at the cavoatrial junction. NG tube enters the stomach. There is mild vascular congestion and bibasilar atelectasis. Suspect small effusions bilaterally. IMPRESSION: Mild vascular congestion and bibasilar atelectasis along with small bilateral effusions. Vascular congestion worsened since prior study. Electronically Signed   By: Rolm Baptise M.D.   On:  12/09/2017 19:18   Dg Chest Port 1 View  Result Date: 12/09/2017 CLINICAL DATA:  Atelectasis EXAM: PORTABLE CHEST 1 VIEW COMPARISON:  December 08, 2017 FINDINGS: Central catheter tip is in the right atrium. Nasogastric tube tip and side port are below the diaphragm. No pneumothorax. There are small pleural effusions bilaterally with mild bibasilar atelectatic change. There is no frank edema or consolidation. Heart is upper normal in size with  pulmonary vascularity normal. No adenopathy. No bone lesions. IMPRESSION: Tube and catheter positions as described without pneumothorax. Small pleural effusions bilaterally with mild bibasilar atelectasis. No consolidation. Stable cardiac silhouette. Electronically Signed   By: Lowella Grip III M.D.   On: 12/09/2017 07:15    EKG:   Orders placed or performed during the hospital encounter of 11/29/17  . ED EKG  . ED EKG  . EKG 12-Lead  . EKG 12-Lead  . EKG 12-Lead  . EKG 12-Lead  . EKG 12-Lead  . EKG 12-Lead  . EKG 12-Lead  . EKG 12-Lead  . EKG 12-Lead  . EKG 12-Lead    ASSESSMENT AND PLAN:   77 year old male with past medical history significant for hypertension, hyperlipidemia and recent diagnosis of H. pylori gastritis on 11/27/2017 presents to hospital from home secondary to weakness, confusion.  1.  Acute respiratory failure- Initially intubated for possible aspiration, extubated however reintubated on 12/09/2017. -Chest x-[place] with pulmonary vascular congestion as well. -Vent management per intensivist.  Recommend IV Lasix.  Echocardiogram if not done recently   2.  Acute encephalopathy-appreciate neurology consult.  Initially considered to be secondary to PRES syndrome -MRI of the brain showing mild volume loss and chronic ischemic changes, no acute findings. -Lumbar puncture was negative -Patient still encephalopathic.  MRI of the lumbar spine and thoracic spine ordered for lower extremity weakness and repeat CT of the head -Also myasthenia gravis labs, labs for para-neoplastic syndrome ordered  3.  H. pylori gastritis-diagnosed with biopsy from recent endoscopy about 2 weeks ago. -Continue treatment with Protonix, amoxicillin and clarithromycin  4.  Hypertension-labile blood pressures. -Continue metoprolol and Norvasc for now and monitor  5.  Right upper extremity DVT-has a PICC line currently in that arm. -Nonocclusive DVT in the right subclavian and an occlusive  DVT in the right cephalic vein seen. -on IV heparin drip for now   Physical therapy consult,  When stable medically Discussed with wife at bedside   All the records are reviewed and case discussed with Care Management/Social Workerr. Management plans discussed with the patient, family and they are in agreement.  CODE STATUS: Full Code  TOTAL TIME TAKING CARE OF THIS PATIENT: 35 minutes.   POSSIBLE D/C IN ? DAYS, DEPENDING ON CLINICAL CONDITION.   Gladstone Lighter M.D on 12/10/2017 at 1:52 PM  Between 7am to 6pm - Pager - [phone]  After 6pm go to [email or web address] - password [email or web address]  Office  [phone]  CC: Primary care physician; Tracie Harrier, MD

## 2017-12-10 NOTE — Progress Notes (Signed)
PT Cancellation Note  Patient Details Name: Marc [name] MRN: [id number] DOB: Jan 22, 1941   Cancelled Treatment:    Reason Eval/Treat Not Completed: Patient not medically ready;Medical issues which prohibited therapy;Other (comment)(Per chart review patient is not medically ready for physical therapy, please reconsult PT when patient is medically appropriate.)  Lieutenant Diego PT, DPT [phone] AM,12/10/17 [phone]

## 2017-12-10 NOTE — Progress Notes (Signed)
Subjective: Patient overnight required re-intubation.  Is not on sedation.  Is not back to baseline cognitively.  Spoke with wife at length and discussed previous weight loss and dysphonia.  Patient with decreased appetite and complaints of nausea with eating.  Did not have complaints of diplopia, extremity weakness or difficulty with gait.    Objective: Current vital signs: BP 108/61   Pulse 97   Temp 98.6 F (37 C) (Axillary)   Resp (!) 21   Ht '5\' 9"'$  (1.753 m)   Wt 60.4 kg   SpO2 100%   BMI 19.66 kg/m  Vital signs in last 24 hours: Temp:  [98.6 F (37 C)-100.8 F (38.2 C)] 98.6 F (37 C) (08/13 1145) Pulse Rate:  [43-130] 97 (08/13 1155) Resp:  [13-28] 21 (08/13 1155) BP: (68-142)/(49-85) 108/61 (08/13 1155) SpO2:  [91 %-100 %] 100 % (08/13 1155) FiO2 (%):  [40 %-60 %] 40 % (08/13 0750)  Intake/Output from previous day: 08/12 0701 - 08/13 0700 In: 2228.5 [I.V.:776.1; NG/GT:1239.2; IV Piggyback:213.2] Out: 2000 [Urine:2000] Intake/Output this shift: Total I/O In: -  Out: 400 [Urine:400] Nutritional status:  Diet Order    None      Neurologic Exam: Mental Status: Opens eyes to verbal stimuli.  Does not follow commands.  No verbalizations noted.  Cranial Nerves: II: patient does not respond confrontation bilaterally, pupils right 3 mm, left 3 mm,and reactive bilaterally III,IV,VI: doll's response present bilaterally.  V,VII: corneal reflex present bilaterally  VIII: patient does not respond to verbal stimuli IX,X: gag reflex reduced, XI: trapezius strength unable to test bilaterally XII: tongue strength unable to test Motor: Moves upper extremities spontaneously but does not grasp my hand.    Lab Results: Basic Metabolic Panel: Recent Labs  Lab 12/06/17 2035 12/07/17 0526  12/08/17 0601 12/08/17 2245 12/09/17 0527 12/09/17 1856 12/09/17 2107 12/10/17 0239  NA 148* 151*   < > 150* 140 144 142  --  144  K 4.0 3.0*   < > 3.2* 3.3* 4.3 3.4*  --  3.8  CL  106 104   < > 104 103 105 97*  --  102  CO2 33* 37*   < > 35* 33* 34* 34*  --  37*  GLUCOSE 253* 249*   < > 224* 155* 159* 283*  --  110*  BUN 46* 38*   < > 33* 31* 32* 32*  --  34*  CREATININE 1.56* 1.31*   < > 1.12 1.01 1.23 1.20  --  1.15  CALCIUM 8.9 9.1   < > 8.5* 7.7* 8.1* 7.9*  --  7.9*  MG 1.9 1.9  --  1.8  --  2.0 2.1 1.7  --   PHOS 4.2 2.6  --  3.5  --  2.0*  --   --  3.7   < > = values in this interval not displayed.    Liver Function Tests: Recent Labs  Lab 12/06/17 2035  AST 40  ALT 31  ALKPHOS 68  BILITOT 0.7  PROT 5.9*  ALBUMIN 2.7*   No results for input(s): LIPASE, AMYLASE in the last 168 hours. Recent Labs  Lab 12/06/17 2035  AMMONIA 13    CBC: Recent Labs  Lab 12/06/17 0503 12/07/17 0526 12/08/17 0601 12/09/17 0527 12/09/17 1856 12/10/17 0239  WBC 6.2 8.7 8.1 5.3 12.2* 14.3*  NEUTROABS 4.8  --  6.4 3.8  --   --   HGB 11.6* 13.9 14.1 11.5* 12.9* 11.9*  HCT 34.0* 41.5  42.1 34.6* 39.3* 34.8*  MCV 97.7 97.7 97.7 97.5 99.6 96.6  PLT 128* 198 212 181 247 236    Cardiac Enzymes: Recent Labs  Lab 12/06/17 2035 12/07/17 1648 12/08/17 1020 12/09/17 2107 12/10/17 0239  CKMB  --   --  2.2  --   --   TROPONINI 0.69* 0.98* 0.52* 0.44* 1.93*    Lipid Panel: Recent Labs  Lab 12/03/17 1704  TRIG 108    CBG: Recent Labs  Lab 12/09/17 1956 12/09/17 2346 12/10/17 0401 12/10/17 0728 12/10/17 1133  GLUCAP 198* 137* 130* 150* 61    Microbiology: Results for orders placed or performed during the hospital encounter of 11/29/17  Urine culture     Status: Abnormal   Collection Time: 11/29/17 11:32 PM  Result Value Ref Range Status   Specimen Description   Final    URINE, RANDOM Performed at [facility], [address], [place], [place] [address]    Special Requests   Final    NONE Performed at [facility], [address], [place], [place] [address]    Culture (A)  Final    <10,000 COLONIES/mL INSIGNIFICANT  GROWTH Performed at [facility], [place] [address] [place], [place] [address]    Report Status 12/01/2017 FINAL  Final  Blood Culture (routine x 2)     Status: None   Collection Time: 11/29/17 11:32 PM  Result Value Ref Range Status   Specimen Description BLOOD LEFT ANTECUBITAL  Final   Special Requests   Final    BOTTLES DRAWN AEROBIC AND ANAEROBIC Blood Culture adequate volume   Culture   Final    NO GROWTH 5 DAYS Performed at [facility], [address] [place], [place] [address]    Report Status 12/04/2017 FINAL  Final  Blood Culture (routine x 2)     Status: None   Collection Time: 11/29/17 11:32 PM  Result Value Ref Range Status   Specimen Description BLOOD RIGHT ANTECUBITAL  Final   Special Requests   Final    BOTTLES DRAWN AEROBIC AND ANAEROBIC Blood Culture adequate volume   Culture   Final    NO GROWTH 5 DAYS Performed at [facility], [address] [place], [place] [address]    Report Status 12/04/2017 FINAL  Final  MRSA PCR Screening     Status: None   Collection Time: 11/30/17 11:59 AM  Result Value Ref Range Status   MRSA by PCR NEGATIVE NEGATIVE Final    Comment:        The GeneXpert MRSA Assay (FDA approved for NASAL specimens only), is one component of a comprehensive MRSA colonization surveillance program. It is not intended to diagnose MRSA infection nor to guide or monitor treatment for MRSA infections. Performed at [facility], [address] [place], [place] [address]   Culture, blood (routine x 2)     Status: None   Collection Time: 12/02/17  8:08 AM  Result Value Ref Range Status   Specimen Description BLOOD BLOOD RIGHT HAND  Final   Special Requests   Final    BOTTLES DRAWN AEROBIC AND ANAEROBIC Blood Culture adequate volume   Culture   Final    NO GROWTH 5 DAYS Performed at [facility], [address], [place], [place] [address]    Report Status 12/07/2017 FINAL  Final  Culture, blood  (routine x 2)     Status: None   Collection Time: 12/02/17  8:08 AM  Result Value Ref Range Status   Specimen Description BLOOD BLOOD RIGHT WRIST  Final   Special Requests   Final    BOTTLES DRAWN AEROBIC AND ANAEROBIC Blood Culture results may not be optimal due to an inadequate volume of blood received in culture bottles   Culture   Final    NO GROWTH 5 DAYS Performed at [facility], [address], [place], [place] [address]    Report Status 12/07/2017 FINAL  Final  CSF culture     Status: None   Collection Time: 12/02/17  9:40 AM  Result Value Ref Range Status   Specimen Description   Final    CSF Performed at [facility] [address] [place], [place] [address]    Special Requests   Final    NONE Performed at [facility], [place], [place] [address]    Gram Stain   Final    RARE WBC SEEN MODERATE RED BLOOD CELLS NO ORGANISMS SEEN    Culture   Final    NO GROWTH 3 DAYS Performed at [facility], [place] [address], [place], [place] [address]    Report Status 12/05/2017 FINAL  Final  Culture, fungus without smear     Status: None (Preliminary result)   Collection Time: 12/02/17  9:40 AM  Result Value Ref Range Status   Specimen Description   Final    CSF Performed at [facility], [address] [place], [place] [address]    Special Requests   Final    NONE Performed at [facility], [address], [place], [place] [address]    Culture   Final    NO FUNGUS ISOLATED AFTER 7 DAYS Performed at [facility], [place] [address], [place], [place] [address]    Report Status PENDING  Incomplete  CULTURE, BLOOD (ROUTINE X 2) w Reflex to ID Panel     Status: None (Preliminary result)   Collection Time: 12/10/17  2:39 AM  Result Value Ref Range Status   Specimen Description BLOOD LEFT AC  Final   Special Requests   Final    BOTTLES DRAWN AEROBIC AND ANAEROBIC Blood Culture adequate volume   Culture    Final    NO GROWTH < 12 HOURS Performed at [facility], [address] [place], [place] [address]    Report Status PENDING  Incomplete  CULTURE, BLOOD (ROUTINE X 2) w Reflex to ID Panel     Status: None (Preliminary result)   Collection Time: 12/10/17  2:39 AM  Result Value Ref Range Status   Specimen Description BLOOD LEFT HAND  Final   Special Requests   Final    BOTTLES DRAWN AEROBIC AND ANAEROBIC Blood Culture adequate volume   Culture   Final    NO GROWTH < 12 HOURS Performed at [facility], [address] [place], [place] [address]    Report Status PENDING  Incomplete    Coagulation Studies: Recent Labs    12/09/17 1856  LABPROT 13.9  INR 1.08    Imaging: Dg Chest Port 1 View  Result Date: 12/09/2017 CLINICAL DATA:  Encounter for intubation EXAM: PORTABLE CHEST 1 VIEW COMPARISON:  12/09/2017 FINDINGS: Endotracheal tube is 2 cm above the carina. Right PICC line tip is at the cavoatrial junction. NG tube enters the stomach. There is mild vascular congestion and bibasilar atelectasis. Suspect small effusions bilaterally. IMPRESSION: Mild vascular congestion and bibasilar atelectasis along  with small bilateral effusions. Vascular congestion worsened since prior study. Electronically Signed   By: Rolm Baptise M.D.   On: 12/09/2017 19:18   Dg Chest Port 1 View  Result Date: 12/09/2017 CLINICAL DATA:  Atelectasis EXAM: PORTABLE CHEST 1 VIEW COMPARISON:  December 08, 2017 FINDINGS: Central catheter tip is in the right atrium. Nasogastric tube tip and side port are below the diaphragm. No pneumothorax. There are small pleural effusions bilaterally with mild bibasilar atelectatic change. There is no frank edema or consolidation. Heart is upper normal in size with pulmonary vascularity normal. No adenopathy. No bone lesions. IMPRESSION: Tube and catheter positions as described without pneumothorax. Small pleural effusions bilaterally with mild bibasilar atelectasis.  No consolidation. Stable cardiac silhouette. Electronically Signed   By: Lowella Grip III M.D.   On: 12/09/2017 07:15    Medications:  I have reviewed the patient's current medications. Scheduled: . amLODipine  10 mg Per Tube Daily  . amoxicillin  1,000 mg Per Tube Q12H  . aspirin  81 mg Per Tube Daily  . chlorhexidine gluconate (MEDLINE KIT)  15 mL Mouth Rinse BID  . clarithromycin  500 mg Per Tube Q12H  . docusate  100 mg Per Tube BID  . feeding supplement (PRO-STAT SUGAR FREE 64)  30 mL Per Tube Daily  . free water  200 mL Per Tube Q4H  . hydrALAZINE  50 mg Oral Q8H  . insulin aspart  0-20 Units Subcutaneous Q4H  . insulin glargine  10 Units Subcutaneous QHS  . mouth rinse  15 mL Mouth Rinse 10 times per day  . metoprolol tartrate  50 mg Per Tube BID  . multivitamin  15 mL Per Tube Daily  . pantoprazole sodium  40 mg Per Tube BID  . sodium chloride flush  10-40 mL Intracatheter Q12H    Assessment/Plan: Patient re-intubated.  There is concern about pre-morbid issues with dysphonia and weight loss.  Paraneoplastic etiology is in the differential.  Patient has had a CT of the chest and abd/pelvis.  There were some lesions noted in the lumbar and thoracic spine.  Patient to have continued follow up. Patient also now with worsening cognitive functioning.  Did not appear to have a prolonged time down.    Recommendations: 1.  Head CT without contrast 2.  MRI with contrast of the lumbar and thoracic spine 3.  Paraneoplastic labs, myasthenia panel, electrophoresis    LOS: 11 days   Alexis Goodell, MD Neurology [phone] 12/10/2017  12:14 PM

## 2017-12-10 NOTE — Progress Notes (Signed)
RT assisted with patient transport to first MRI, to CT, and then back to room in [place]. Patient was transported on trilogy transport ventilation with no complications. Patient back in room at this time and back on Servi-I ventilator doing well.

## 2017-12-10 NOTE — Progress Notes (Signed)
No charge note.  Palliative care consult received. Chart reviewed. Discussed patient with Dr. Alva Garnet. Per discussion, will hold off on [place] meeting with family for now. PMT will continue to shadow chart. Please call if needed sooner.   Thank you for this consult.  Juel Burrow, DNP, AGNP-C Palliative Medicine Team Team Phone # [phone]  Pager # [phone]

## 2017-12-10 NOTE — Progress Notes (Signed)
Patient remains on vent. o2 weaned. Sputum sample obtained and sent to lab. Patient tolerated well. Vent functioning properly. Et tube remains in proper position.

## 2017-12-10 NOTE — Progress Notes (Signed)
ANTICOAGULATION CONSULT NOTE  Pharmacy Consult for heparin drip management  Indication: DVT  Patient Measurements: Height: 5\' 9"  (175.3 cm) Weight: 133 lb 2.5 oz (60.4 kg) IBW/kg (Calculated) : 70.7   Vital Signs: Temp: 100 F (37.8 C) (08/13 0520) Temp Source: Oral (08/13 0520) BP: 121/83 (08/13 0515) Pulse Rate: 99 (08/13 0520)  Labs: Recent Labs    12/08/17 0601 12/08/17 1020  12/09/17 0527 12/09/17 1856 12/09/17 2107 12/10/17 0239  HGB 14.1  --   --  11.5* 12.9*  --  11.9*  HCT 42.1  --   --  34.6* 39.3*  --  34.8*  PLT 212  --   --  181 247  --  236  APTT  --   --   --   --  63*  --   --   LABPROT  --   --   --   --  13.9  --   --   INR  --   --   --   --  1.08  --   --   HEPARINUNFRC 0.46  --   --  0.37  --   --  0.52  CREATININE 1.12  --    < > 1.23 1.20  --  1.15  CKMB  --  2.2  --   --   --   --   --   TROPONINI  --  0.52*  --   --   --  0.44* 1.93*   < > = values in this interval not displayed.    Estimated Creatinine Clearance: 47.4 mL/min (by C-G formula based on SCr of 1.15 mg/dL).  Medical History: Past Medical History:  Diagnosis Date  . Anxiety   . BPH (benign prostatic hyperplasia)   . Colon adenomas    history of  . Diverticulosis   . Hyperlipidemia   . Hypertension    Assessment: Pharmacy consulted for heparin drip management for 77 yo male admitted with AMS and being treated for DVT of right upper extremety. Patient remains sedated and requiring mechanical ventilation. 8/8 AM heparin level 0.29. Patient was given 900 unit bolus and rate was increased to 1100 units/hr.   Goal of Therapy:  Heparin level 0.3-0.7 units/ml Monitor platelets by anticoagulation protocol: Yes   08/08 1430 HL therapeutic @ 0.47. Will continue with current heparin rate of 1100 units/hr. Will recheck confirmatory level in 8 hours. CBC with AM labs per protocol.   Plan:   8/8: HL @ 2118 = 0.35 Will continue pt on current rate and recheck HL on 8/9 with AM labs.    8/9 AM heparin level 0.43. Continue current regimen. Recheck heparin level and CBC with tomorrow AM labs.  8/10 AM heparin level 0.44. Continue current regimen. Recheck heparin level and CBC with tomorrow AM labs.  8/11 AM heparin level 0.46. Continue current regimen. Recheck heparin level and CBC with tomorrow AM labs.  8/12 AM heparin level 0.37. Continue current regimen. Recheck heparin level and CBC with tomorrow AM labs.  08/13 @ 0230 HL 0.52 therapeutic. Will continue current rate and recheck w/ am labs.  Pharmacy will continue to monitor and adjust per consult.   Tobie Lords, PharmD Clinical Pharmacist 12/10/2017 5:41 AM

## 2017-12-10 NOTE — Progress Notes (Signed)
Nutrition Follow-up  DOCUMENTATION CODES:   Severe malnutrition in context of chronic illness, Underweight  INTERVENTION:  Initiate new goal regimen of Vital AF 1.2 at 60 mL/hr (1440 mL goal daily volume) via NGT. Provides 1728 kcal, 108 grams of protein, 7 grams of fiber, 1166 mL H2O daily.  Will discontinue MVI as new goal regimen meets 100% RDIs for vitamins/minerals.  With free water flush of 200 mL Q4hrs patient will receive a total of 2366 mL H2O including water in tube feeding.  NUTRITION DIAGNOSIS:   Severe Malnutrition related to chronic illness(etiology unknown at this time) as evidenced by moderate fat depletion, severe fat depletion, moderate muscle depletion, severe muscle depletion.  Ongoing - addressing with TF regimen.  GOAL:   Provide needs based on ASPEN/SCCM guidelines  Met with TF regimen.  MONITOR:   I & O's, Labs, TF tolerance, Weight trends  REASON FOR ASSESSMENT:   Ventilator    ASSESSMENT:   77 year old male with PMHx of HTN, HLD, anxiety, BPH, hx of colon adenomas, diverticulosis who was admitted with AMS of unclear etiology, required intubation on 8/3 for airway protection, possible PNA.   -On treatment for H. pylori through 8/16. -Patient was extubated on 8/8. -Failed bedside swallow evaluation on 8/9 by SLP so NPO status was continued. -NGT was placed on 8/9 and tube feeds were restarted. -Patient was re-intubated on evening of 8/12 following cardiac arrest and 5-6 minutes of CPR.  Patient intubated. Was not on any sedation this AM when assessed by RD. Currently on propofol gtt, which may just be for CT and MRI patient is currently at. On PRVC mode with FiO2 40%. Abdomen feels soft. Patient having type 7 BMs.  Access: 14 Fr. NGT placed 8/9; terminates in body of stomach per abdominal x-Xeng 8/10; 65 cm at corner of mouth  MAP: 69-96 mmHg  TF: pt has been tolerating Vital 1.5 at 50 mL/hr  Patient is currently intubated on ventilator  support Ve: 9 L/min Temp (24hrs), Avg:99.7 F (37.6 C), Min:98.6 F (37 C), Max:100.8 F (38.2 C)  Propofol: 5.4 mL/hr (143 kcal daily)  Medications reviewed and include: amoxicillin, clarithromycin, Colace, free water flush 200 mL Q4hrs, Novolog 0-20 units Q4hrs, Lantus 10 units QHS, liquid MVI daily, pantoprazole, phenylephrine gtt at 15 mcg/min, propofol gtt.  Labs reviewed: CBG 94-212 past 24 hrs, CO2 37, BUN 34.  I/O: 2000 mL UOP yesterday (1.4 mL/kg/hr); 3 BM yesterday  Weight trend: 60.4 kg on 8/12; +3.7 kg from admission  Discussed with RN and on rounds.  Diet Order:   Diet Order    None      EDUCATION NEEDS:   No education needs have been identified at this time  Skin:  Skin Assessment: Skin Integrity Issues:(closed incision to back from LP; MSAD to buttocks and perineum)  Last BM:  12/10/2017 - medium type 7  Height:   Ht Readings from Last 1 Encounters:  11/30/17 5' 9"  (1.753 m)    Weight:   Wt Readings from Last 1 Encounters:  12/09/17 60.4 kg    Ideal Body Weight:  72.7 kg  BMI:  Body mass index is 19.66 kg/m.  Estimated Nutritional Needs:   Kcal:  2542 (PSU 2003b w/ MSJ 1298, Ve 9, Tmax 38.2)  Protein:  100-115 grams (1.8-2 grams/kg)  Fluid:  1.4-1.7 L/day (25-30 mL/kg)  Willey Blade, MS, RD, LDN Office: [phone] Pager: [phone] After Hours/Weekend Pager: [phone]

## 2017-12-11 DIAGNOSIS — I82621 Acute embolism and thrombosis of deep veins of right upper extremity: Secondary | ICD-10-CM

## 2017-12-11 DIAGNOSIS — L8990 Pressure ulcer of unspecified site, unspecified stage: Secondary | ICD-10-CM

## 2017-12-11 DIAGNOSIS — J690 Pneumonitis due to inhalation of food and vomit: Secondary | ICD-10-CM

## 2017-12-11 DIAGNOSIS — G629 Polyneuropathy, unspecified: Secondary | ICD-10-CM

## 2017-12-11 LAB — IMMUNOFIXATION ELECTROPHORESIS
IGA: 66 mg/dL (ref 61–437)
IGM (IMMUNOGLOBULIN M), SRM: 27 mg/dL (ref 15–143)
IgG (Immunoglobin G), Serum: 489 mg/dL — ABNORMAL LOW (ref 700–1600)
Total Protein ELP: 4.6 g/dL — ABNORMAL LOW (ref 6.0–8.5)

## 2017-12-11 LAB — GLUCOSE, CAPILLARY
GLUCOSE-CAPILLARY: 152 mg/dL — AB (ref 70–99)
GLUCOSE-CAPILLARY: 165 mg/dL — AB (ref 70–99)
GLUCOSE-CAPILLARY: 167 mg/dL — AB (ref 70–99)
Glucose-Capillary: 126 mg/dL — ABNORMAL HIGH (ref 70–99)
Glucose-Capillary: 108 mg/dL — ABNORMAL HIGH (ref 70–99)
Glucose-Capillary: 148 mg/dL — ABNORMAL HIGH (ref 70–99)

## 2017-12-11 LAB — CBC
HCT: 28.8 % — ABNORMAL LOW (ref 40.0–52.0)
Hemoglobin: 9.7 g/dL — ABNORMAL LOW (ref 13.0–18.0)
MCH: 32.9 pg (ref 26.0–34.0)
MCHC: 33.7 g/dL (ref 32.0–36.0)
MCV: 97.7 fL (ref 80.0–100.0)
PLATELETS: 153 10*3/uL (ref 150–440)
RBC: 2.95 MIL/uL — AB (ref 4.40–5.90)
RDW: 13.3 % (ref 11.5–14.5)
WBC: 11.3 10*3/uL — AB (ref 3.8–10.6)

## 2017-12-11 LAB — CLOSTRIDIUM DIFFICILE BY PCR, REFLEXED: Toxigenic C. Difficile by PCR: NEGATIVE

## 2017-12-11 LAB — URINE CULTURE: Culture: NO GROWTH

## 2017-12-11 LAB — HEPARIN LEVEL (UNFRACTIONATED): Heparin Unfractionated: 0.4 IU/mL (ref 0.30–0.70)

## 2017-12-11 LAB — C DIFFICILE QUICK SCREEN W PCR REFLEX
C Diff antigen: POSITIVE — AB
C Diff toxin: NEGATIVE

## 2017-12-11 LAB — ANTI-SMOOTH MUSCLE ANTIBODY, IGG: F-ACTIN AB IGG: 3 U (ref 0–19)

## 2017-12-11 LAB — TROPONIN I: TROPONIN I: 0.79 ng/mL — AB (ref <0.03)

## 2017-12-11 LAB — STRIATED MUSCLE ANTIBODY: Anti-striation Abs: NEGATIVE

## 2017-12-11 LAB — ACETYLCHOLINE RECEPTOR, BINDING: Acety choline binding ab: <0.03 nmol/L (ref 0.00–0.24)

## 2017-12-11 MED ORDER — PANTOPRAZOLE SODIUM 40 MG PO PACK
40.0000 mg | PACK | Freq: Every day | ORAL | Status: DC
  Administered 2017-12-14 – 2017-12-21 (×6): 40 mg
  Filled 2017-12-11 (×6): qty 20

## 2017-12-11 MED ORDER — SODIUM CHLORIDE 0.9 % IV SOLN
3.0000 g | Freq: Four times a day (QID) | INTRAVENOUS | Status: DC
  Administered 2017-12-11 – 2017-12-13 (×9): 3 g via INTRAVENOUS
  Filled 2017-12-11 (×16): qty 3

## 2017-12-11 NOTE — Consult Note (Addendum)
Marc [name], Marc [name] [id number] 1941-03-07 Riley Nearing, MD  Reason for Consult: respiratory failure Requesting Physician: Gladstone Lighter, MD Consulting Physician: Riley Nearing, MD  HPI: This 77 y.o. year old male was admitted on 11/29/2017 for Dehydration [E86.0] Altered mental status [R41.82] Pleural effusion [I95] Helicobacter pylori gastritis [K29.70, B96.81] Generalized weakness [R53.1] Glasgow coma scale total score 3-8, at hospital admission [facility]) [J88.4166].  The patient was intubated in the ER at time of admission due to respiratory distress. Cause of altered mental status at time of admissions is uncertain, though was suspected to have pneumonia. Recent EGD performed by Dr. Tiffany Kocher due to progressive weight loss showed some short segment Barrett's esophagus, but otherwise unremarkable. CT/MRI of head shows chronic small vessel change with no acute changes, but he is felt to have an acute encephalopathy apparently, of uncertain etiology, and ruled out for meningitis. The patient was extubated on 8/8, then placed on BiPAP and subsequently reintubated 8/12, which appears to have been respiratory related arrest.  Medications:  Current Facility-Administered Medications  Medication Dose Route Frequency Provider Last Rate Last Dose  . 0.9 %  sodium chloride infusion   Intravenous PRN Tukov-Yual, Magdalene S, NP   Stopped at 12/09/17 2200  . acetaminophen (TYLENOL) tablet 650 mg  650 mg Per Tube Q6H PRN Wilhelmina Mcardle, MD   650 mg at 12/10/17 0048   Or  . acetaminophen (TYLENOL) suppository 650 mg  650 mg Rectal Q6H PRN Wilhelmina Mcardle, MD      . amLODipine (NORVASC) tablet 10 mg  10 mg Per Tube Daily Wilhelmina Mcardle, MD   10 mg at 12/11/17 1032  . Ampicillin-Sulbactam (UNASYN) 3 g in sodium chloride 0.9 % 100 mL IVPB  3 g Intravenous Q6H Ulice Dash D, RPH 200 mL/hr at 12/11/17 1730 3 g at 12/11/17 1730  . aspirin chewable tablet 81 mg  81 mg Per Tube Daily Awilda Bill, NP   81 mg  at 12/11/17 1032  . bisacodyl (DULCOLAX) EC tablet 5 mg  5 mg Oral Daily PRN Tukov-Yual, Magdalene S, NP      . chlorhexidine gluconate (MEDLINE KIT) (PERIDEX) 0.12 % solution 15 mL  15 mL Mouth Rinse BID Awilda Bill, NP   15 mL at 12/11/17 0830  . clarithromycin (BIAXIN) tablet 500 mg  500 mg Per Tube Q12H Wilhelmina Mcardle, MD   500 mg at 12/11/17 1032  . docusate (COLACE) 50 MG/5ML liquid 100 mg  100 mg Per Tube BID Tukov-Yual, Magdalene S, NP   100 mg at 12/11/17 1032  . feeding supplement (PRO-STAT SUGAR FREE 64) liquid 30 mL  30 mL Per Tube Daily Tukov-Yual, Magdalene S, NP   30 mL at 12/11/17 1032  . feeding supplement (VITAL AF 1.2 CAL) liquid 1,000 mL  1,000 mL Per Tube Continuous Wilhelmina Mcardle, MD 60 mL/hr at 12/11/17 1600    . fentaNYL (SUBLIMAZE) injection 50 mcg  50 mcg Intravenous Q2H PRN Wilhelmina Mcardle, MD      . free water 200 mL  200 mL Per Tube Q4H Wilhelmina Mcardle, MD   200 mL at 12/11/17 1615  . heparin ADULT infusion 100 units/mL (25000 units/240m sodium chloride 0.45%)  1,100 Units/hr Intravenous Continuous Tukov-Yual, Magdalene S, NP 11 mL/hr at 12/11/17 1600 1,100 Units/hr at 12/11/17 1600  . hydrALAZINE (APRESOLINE) tablet 50 mg  50 mg Oral Q8H Tukov-Yual, Magdalene S, NP   50 mg at 12/09/17 1422  . insulin aspart (novoLOG)  injection 0-20 Units  0-20 Units Subcutaneous Q4H Tukov-Yual, Magdalene S, NP   3 Units at 12/11/17 1241  . insulin glargine (LANTUS) injection 10 Units  10 Units Subcutaneous QHS Tukov-Yual, Magdalene S, NP   10 Units at 12/10/17 2134  . ipratropium-albuterol (DUONEB) 0.5-2.5 (3) MG/3ML nebulizer solution 3 mL  3 mL Nebulization Q4H PRN Wilhelmina Mcardle, MD   3 mL at 12/09/17 1601  . labetalol (NORMODYNE,TRANDATE) injection 10 mg  10 mg Intravenous Q6H PRN Tukov-Yual, Magdalene S, NP   10 mg at 12/08/17 0145  . lip balm (BLISTEX) ointment   Topical PRN Tukov-Yual, Magdalene S, NP      . MEDLINE mouth rinse  15 mL Mouth Rinse 10 times per day  Awilda Bill, NP   15 mL at 12/11/17 1730  . metoprolol tartrate (LOPRESSOR) tablet 50 mg  50 mg Per Tube BID Wilhelmina Mcardle, MD   50 mg at 12/11/17 1032  . pantoprazole sodium (PROTONIX) 40 mg/20 mL oral suspension 40 mg  40 mg Per Tube BID Wilhelmina Mcardle, MD   40 mg at 12/11/17 1032  . [START ON 12/14/2017] pantoprazole sodium (PROTONIX) 40 mg/20 mL oral suspension 40 mg  40 mg Per Tube Daily Wilhelmina Mcardle, MD      . phenol (CHLORASEPTIC) mouth spray 1 spray  1 spray Mouth/Throat PRN Tukov-Yual, Magdalene S, NP      . phenylephrine (NEO-SYNEPHRINE) 40 mg in sodium chloride 0.9 % 250 mL (0.16 mg/mL) infusion  0-400 mcg/min Intravenous Titrated Darel Hong D, NP 5.6 mL/hr at 12/11/17 1600 14.933 mcg/min at 12/11/17 1600  . propofol (DIPRIVAN) 1000 MG/100ML infusion  0-50 mcg/kg/min Intravenous Continuous Wilhelmina Mcardle, MD 7.2 mL/hr at 12/11/17 1600 19.868 mcg/kg/min at 12/11/17 1600  . sodium chloride flush (NS) 0.9 % injection 10-40 mL  10-40 mL Intracatheter Q12H Tukov-Yual, Magdalene S, NP   10 mL at 12/11/17 1033  . sodium chloride flush (NS) 0.9 % injection 10-40 mL  10-40 mL Intracatheter PRN Tukov-Yual, Arlyss Gandy, NP      .  Medications Prior to Admission  Medication Sig Dispense Refill  . carvedilol (COREG) 25 MG tablet Take 25 mg by mouth 2 (two) times daily with a meal.    . imipramine (TOFRANIL) 50 MG tablet Take 50 mg by mouth at bedtime.    . megestrol (MEGACE) 40 MG/ML suspension Take 400 mg by mouth 2 (two) times daily.     Marland Kitchen amLODipine (NORVASC) 5 MG tablet Take 1 tablet (5 mg total) by mouth daily. (Patient not taking: Reported on 11/29/2017) 30 tablet 0  . carbamide peroxide (DEBROX) 6.5 % otic solution Place 5 drops into the right ear 2 (two) times daily. (Patient not taking: Reported on 11/29/2017) 15 mL 0  . meclizine (ANTIVERT) 25 MG tablet Take 1 tablet (25 mg total) by mouth 3 (three) times daily. (Patient not taking: Reported on 11/29/2017) 30 tablet 0     Allergies:  Allergies  Allergen Reactions  . Glimepiride   . Tape Itching and Rash    PMH:  Past Medical History:  Diagnosis Date  . Anxiety   . BPH (benign prostatic hyperplasia)   . Colon adenomas    history of  . Diverticulosis   . Hyperlipidemia   . Hypertension     Fam Hx:  Family History  Problem Relation Age of Onset  . Hypertension Other   . Diabetes Neg Hx     Soc Hx:  Social  History   Socioeconomic History  . Marital status: Married    Spouse name: Not on file  . Number of children: Not on file  . Years of education: Not on file  . Highest education level: Not on file  Occupational History  . Not on file  Social Needs  . Financial resource strain: Not on file  . Food insecurity:    Worry: Not on file    Inability: Not on file  . Transportation needs:    Medical: Not on file    Non-medical: Not on file  Tobacco Use  . Smoking status: Never Smoker  . Smokeless tobacco: Never Used  Substance and Sexual Activity  . Alcohol use: No  . Drug use: No  . Sexual activity: Not on file  Lifestyle  . Physical activity:    Days per week: Not on file    Minutes per session: Not on file  . Stress: Not on file  Relationships  . Social connections:    Talks on phone: Not on file    Gets together: Not on file    Attends religious service: Not on file    Active member of club or organization: Not on file    Attends meetings of clubs or organizations: Not on file    Relationship status: Not on file  . Intimate partner violence:    Fear of current or ex partner: Not on file    Emotionally abused: Not on file    Physically abused: Not on file    Forced sexual activity: Not on file  Other Topics Concern  . Not on file  Social History Narrative  . Not on file    PSH:  Past Surgical History:  Procedure Laterality Date  . CHOLECYSTECTOMY    . COLONOSCOPY WITH PROPOFOL N/A 01/23/2016   Procedure: COLONOSCOPY WITH PROPOFOL;  Surgeon: Manya Silvas, MD;  Location: [facility] ENDOSCOPY;  Service: Endoscopy;  Laterality: N/A;  . ESOPHAGOGASTRODUODENOSCOPY (EGD) WITH PROPOFOL N/A 11/27/2017   Procedure: ESOPHAGOGASTRODUODENOSCOPY (EGD) WITH PROPOFOL;  Surgeon: Manya Silvas, MD;  Location: [facility] ENDOSCOPY;  Service: Endoscopy;  Laterality: N/A;  . Procedures since admission: No admission procedures for hospital encounter.  ROS: Review of systems normal other than 12 systems except per HPI.  PHYSICAL EXAM  Vitals: Blood pressure 120/69, pulse 100, temperature 99.9 F (37.7 C), temperature source Axillary, resp. rate (!) 22, height 5' 9"  (1.753 m), weight 62.5 kg, SpO2 100 %.. General: Well-developed, Well-nourished in no acute distress Mood: Mood and affect well adjusted, pleasant and cooperative. Orientation: Grossly alert and oriented. Vocal Quality: No hoarseness. Communicates verbally. head and Face: NCAT. No facial asymmetry. No visible skin lesions. No significant facial scars. No tenderness with sinus percussion. Facial strength normal and symmetric. Ears: External ears with normal landmarks, no lesions. External auditory canals free of infection, cerumen impaction or lesions. Tympanic membranes intact with good landmarks and normal mobility on pneumatic otoscopy. No middle ear effusion. Hearing: Speech reception grossly normal. Nose: External nose normal with midline dorsum and no lesions or deformity. Nasal Cavity reveals essentially midline septum with normal inferior turbinates. No significant mucosal congestion or erythema. Nasal secretions are minimal and clear. No polyps seen on anterior rhinoscopy. Oral Cavity/ Oropharynx: Lips are normal with no lesions. Teeth no frank dental caries. Gingiva healthy with no lesions or gingivitis. Oropharynx including tongue, buccal mucosa, floor of mouth, hard and soft palate, uvula and posterior pharynx free of exudates, erythema or lesions with normal symmetry  and hydration.  Indirect  Laryngoscopy/Nasopharyngoscopy: Visualization of the larynx, hypopharynx and nasopharynx is not possible in this setting with routine examination. Neck: Supple and symmetric with no palpable masses, tenderness or crepitance. The trachea is midline. Thyroid gland is soft, nontender and symmetric with no masses or enlargement. Parotid and submandibular glands are soft, nontender and symmetric, without masses. Lymphatic: Cervical lymph nodes are without palpable lymphadenopathy or tenderness. Respiratory: Normal respiratory effort without labored breathing. Cardiovascular: Carotid pulse shows regular rate and rhythm Neurologic: Cranial Nerves II through XII are grossly intact. Eyes: Gaze and Ocular Motility are grossly normal. PERRLA. No visible nystagmus.  MEDICAL DECISION MAKING: Data Review:  Results for orders placed or performed during the hospital encounter of 11/29/17 (from the past 48 hour(s))  Glucose, capillary     Status: Abnormal   Collection Time: 12/09/17  6:36 PM  Result Value Ref Range   Glucose-Capillary 207 (H) 70 - 99 mg/dL  Blood gas, arterial     Status: Abnormal   Collection Time: 12/09/17  6:53 PM  Result Value Ref Range   FIO2 0.60    Delivery systems VENTILATOR    Mode PRESSURE REGULATED VOLUME CONTROL    VT 500 mL   Peep/cpap 5.0 cm H20   pH, Arterial 7.47 (H) 7.350 - 7.450   pCO2 arterial 50 (H) 32.0 - 48.0 mmHg   pO2, Arterial 145 (H) 83.0 - 108.0 mmHg   Bicarbonate 36.4 (H) 20.0 - 28.0 mmol/L   Acid-Base Excess 11.0 (H) 0.0 - 2.0 mmol/L   O2 Saturation 99.4 %   Patient temperature 37.0    Collection site LEFT RADIAL    Sample type ARTERIAL DRAW    Allens test (pass/fail) PASS PASS   Mechanical Rate 15     Comment: Performed at [facility], [address] [place], [place] [address]  CBC     Status: Abnormal   Collection Time: 12/09/17  6:56 PM  Result Value Ref Range   WBC 12.2 (H) 3.8 - 10.6 K/uL   RBC 3.94 (L) 4.40 - 5.90 MIL/uL    Hemoglobin 12.9 (L) 13.0 - 18.0 g/dL   HCT 39.3 (L) 40.0 - 52.0 %   MCV 99.6 80.0 - 100.0 fL   MCH 32.6 26.0 - 34.0 pg   MCHC 32.7 32.0 - 36.0 g/dL   RDW 13.4 11.5 - 14.5 %   Platelets 247 150 - 440 K/uL    Comment: Performed at [facility], [address] [place], [place] [address]  Basic metabolic panel     Status: Abnormal   Collection Time: 12/09/17  6:56 PM  Result Value Ref Range   Sodium 142 135 - 145 mmol/L   Potassium 3.4 (L) 3.5 - 5.1 mmol/L   Chloride 97 (L) 98 - 111 mmol/L   CO2 34 (H) 22 - 32 mmol/L   Glucose, Bld 283 (H) 70 - 99 mg/dL   BUN 32 (H) 8 - 23 mg/dL   Creatinine, Ser 1.20 0.61 - 1.24 mg/dL   Calcium 7.9 (L) 8.9 - 10.3 mg/dL   GFR calc non Af Amer 57 (L) >60 mL/min   GFR calc Af Amer >60 >60 mL/min    Comment: (NOTE) The eGFR has been calculated using the CKD EPI equation. This calculation has not been validated in all clinical situations. eGFR's persistently <60 mL/min signify possible Chronic Kidney Disease.    Anion gap 11 5 - 15    Comment: Performed at [facility], [place]  [address], [place], [place] [address]  APTT     Status: Abnormal   Collection Time: 12/09/17  6:56 PM  Result Value Ref Range   aPTT 63 (H) 24 - 36 seconds    Comment:        IF BASELINE aPTT IS ELEVATED, SUGGEST PATIENT RISK ASSESSMENT BE USED TO DETERMINE APPROPRIATE ANTICOAGULANT THERAPY. Performed at [facility], [address] [place], [place] [address]   Protime-INR     Status: None   Collection Time: 12/09/17  6:56 PM  Result Value Ref Range   Prothrombin Time 13.9 11.4 - 15.2 seconds   INR 1.08     Comment: Performed at [facility], [address], [place], [place] [address]  Magnesium     Status: None   Collection Time: 12/09/17  6:56 PM  Result Value Ref Range   Magnesium 2.1 1.7 - 2.4 mg/dL    Comment: Performed at [facility], [address] [place], [place] [address]  Glucose, capillary     Status:  Abnormal   Collection Time: 12/09/17  7:56 PM  Result Value Ref Range   Glucose-Capillary 198 (H) 70 - 99 mg/dL   Comment 1 Notify RN    Comment 2 Document in Chart   Magnesium     Status: None   Collection Time: 12/09/17  9:07 PM  Result Value Ref Range   Magnesium 1.7 1.7 - 2.4 mg/dL    Comment: Performed at [facility], [address] [place], [place] [address]  Troponin I     Status: Abnormal   Collection Time: 12/09/17  9:07 PM  Result Value Ref Range   Troponin I 0.44 (HH) <0.03 ng/mL    Comment: CRITICAL VALUE NOTED. VALUE IS CONSISTENT WITH PREVIOUSLY REPORTED/CALLED VALUE / [facility] Performed at [facility], [address] [place], [place] [address]   Glucose, capillary     Status: Abnormal   Collection Time: 12/09/17 11:46 PM  Result Value Ref Range   Glucose-Capillary 137 (H) 70 - 99 mg/dL   Comment 1 Notify RN    Comment 2 Document in Chart   Urine Culture     Status: None   Collection Time: 12/10/17 12:42 AM  Result Value Ref Range   Specimen Description      URINE, RANDOM Performed at [facility], [address] [place], [place] [address]    Special Requests      NONE Performed at [facility], [address], [place], [place] [address]    Culture      NO GROWTH Performed at [facility], [place] [address], [place], [place] [address]    Report Status 12/11/2017 FINAL   Urinalysis, Complete w Microscopic     Status: Abnormal   Collection Time: 12/10/17 12:43 AM  Result Value Ref Range   Color, Urine YELLOW (A) YELLOW   APPearance HAZY (A) CLEAR   Specific Gravity, Urine 1.013 1.005 - 1.030   pH 6.0 5.0 - 8.0   Glucose, UA NEGATIVE NEGATIVE mg/dL   Hgb urine dipstick MODERATE (A) NEGATIVE   Bilirubin Urine NEGATIVE NEGATIVE   Ketones, ur NEGATIVE NEGATIVE mg/dL   Protein, ur NEGATIVE NEGATIVE mg/dL   Nitrite NEGATIVE NEGATIVE   Leukocytes, UA NEGATIVE NEGATIVE   RBC / HPF 21-50 0 - 5 RBC/hpf   WBC, UA 11-20  0 - 5 WBC/hpf   Bacteria, UA RARE (A) NONE SEEN   Squamous Epithelial / LPF 0-5 0 -  5   Mucus PRESENT     Comment: Performed at [facility], [place], [place] [address]  Heparin level (unfractionated)     Status: None   Collection Time: 12/10/17  2:39 AM  Result Value Ref Range   Heparin Unfractionated 0.52 0.30 - 0.70 IU/mL    Comment: (NOTE) If heparin results are below expected values, and patient dosage has  been confirmed, suggest follow up testing of antithrombin III levels. Performed at [facility], [address] [place], [place] [address]   CBC     Status: Abnormal   Collection Time: 12/10/17  2:39 AM  Result Value Ref Range   WBC 14.3 (H) 3.8 - 10.6 K/uL   RBC 3.60 (L) 4.40 - 5.90 MIL/uL   Hemoglobin 11.9 (L) 13.0 - 18.0 g/dL   HCT 34.8 (L) 40.0 - 52.0 %   MCV 96.6 80.0 - 100.0 fL   MCH 32.9 26.0 - 34.0 pg   MCHC 34.1 32.0 - 36.0 g/dL   RDW 13.2 11.5 - 14.5 %   Platelets 236 150 - 440 K/uL    Comment: Performed at [facility], [address] [place], [place] [address]  Basic metabolic panel     Status: Abnormal   Collection Time: 12/10/17  2:39 AM  Result Value Ref Range   Sodium 144 135 - 145 mmol/L   Potassium 3.8 3.5 - 5.1 mmol/L   Chloride 102 98 - 111 mmol/L   CO2 37 (H) 22 - 32 mmol/L   Glucose, Bld 110 (H) 70 - 99 mg/dL   BUN 34 (H) 8 - 23 mg/dL   Creatinine, Ser 1.15 0.61 - 1.24 mg/dL   Calcium 7.9 (L) 8.9 - 10.3 mg/dL   GFR calc non Af Amer >60 >60 mL/min   GFR calc Af Amer >60 >60 mL/min    Comment: (NOTE) The eGFR has been calculated using the CKD EPI equation. This calculation has not been validated in all clinical situations. eGFR's persistently <60 mL/min signify possible Chronic Kidney Disease.    Anion gap 5 5 - 15    Comment: Performed at [facility], [address] [place], [place] [address]  Phosphorus     Status: None   Collection Time: 12/10/17  2:39 AM  Result Value Ref  Range   Phosphorus 3.7 2.5 - 4.6 mg/dL    Comment: Performed at [facility], [address] [place], [place] [address]  Troponin I     Status: Abnormal   Collection Time: 12/10/17  2:39 AM  Result Value Ref Range   Troponin I 1.93 (HH) <0.03 ng/mL    Comment: CRITICAL VALUE NOTED. VALUE IS CONSISTENT WITH PREVIOUSLY REPORTED/CALLED VALUE / [facility] Performed at [facility], [address] [place], [place] [address]   CULTURE, BLOOD (ROUTINE X 2) w Reflex to ID Panel     Status: None (Preliminary result)   Collection Time: 12/10/17  2:39 AM  Result Value Ref Range   Specimen Description BLOOD LEFT AC    Special Requests      BOTTLES DRAWN AEROBIC AND ANAEROBIC Blood Culture adequate volume   Culture      NO GROWTH 1 DAY Performed at [facility], [address] [place], [place] [address]    Report Status PENDING   CULTURE, BLOOD (ROUTINE X 2) w Reflex to ID Panel     Status: None (Preliminary result)   Collection Time: 12/10/17  2:39 AM  Result Value Ref Range   Specimen Description BLOOD LEFT HAND    Special Requests      BOTTLES DRAWN AEROBIC AND ANAEROBIC Blood Culture adequate volume   Culture      NO GROWTH 1 DAY Performed at [facility], [address] [place], [place] [address]    Report Status PENDING   Culture, respiratory (non-expectorated)     Status: None (Preliminary result)   Collection Time: 12/10/17  2:55 AM  Result Value Ref Range   Specimen Description      TRACHEAL ASPIRATE Performed at [facility], [address] [place], [place] [address]    Special Requests      NONE Performed at [facility], [address], [place], [place] [address]    Gram Stain      RARE WBC PRESENT,BOTH PMN AND MONONUCLEAR FEW GRAM POSITIVE COCCI IN PAIRS Performed at [facility], [place] [address] [place], [place] [address]    Culture MODERATE STAPHYLOCOCCUS AUREUS    Report Status PENDING   Glucose,  capillary     Status: Abnormal   Collection Time: 12/10/17  4:01 AM  Result Value Ref Range   Glucose-Capillary 130 (H) 70 - 99 mg/dL   Comment 1 Notify RN    Comment 2 Document in Chart   Glucose, capillary     Status: Abnormal   Collection Time: 12/10/17  7:28 AM  Result Value Ref Range   Glucose-Capillary 150 (H) 70 - 99 mg/dL  Acetylcholine receptor, binding     Status: None   Collection Time: 12/10/17 10:34 AM  Result Value Ref Range   Acety choline binding ab <0.03 0.00 - 0.24 nmol/L    Comment: (NOTE)                               Negative:   0.00 - 0.24                               Borderline: 0.25 - 0.40                               Positive:        > 0.40 Performed At: [facility] [place], [place] [id number] Rush Farmer MD [id number]   Striated muscle antibody     Status: None   Collection Time: 12/10/17 10:34 AM  Result Value Ref Range   Anti-striation Abs Negative Neg:<1:40    Comment: (NOTE) Performed At: [facility] [address] [place], [place] [id number] Rush Farmer MD [id number]   Anti-smooth muscle antibody, IgG     Status: None   Collection Time: 12/10/17 10:34 AM  Result Value Ref Range   F-Actin IgG 3 0 - 19 Units    Comment: (NOTE)                 Negative                     0 - 19                 Weak positive               20 - 30  Moderate to strong positive     >30 Actin Antibodies are found in 52-85% of patients with autoimmune hepatitis or chronic active hepatitis and in 22% of patients with primary biliary cirrhosis. Performed At: [facility] [place], [place] [id number] Rush Farmer MD [id number]   Immunofixation electrophoresis     Status: Abnormal   Collection Time: 12/10/17 10:34 AM  Result Value Ref Range   Total Protein ELP 4.6 (L) 6.0 - 8.5 g/dL   IgG (Immunoglobin G), Serum 489 (L) 700 - 1,600 mg/dL   IgA 66 61 - 437 mg/dL   IgM  (Immunoglobulin M), Srm 27 15 - 143 mg/dL    Comment: (NOTE) Performed At: [facility] [place], [place] [id number] Rush Farmer MD [id number]    Immunofixation Result, Serum Comment     Comment: An apparent normal immunofixation pattern.  Glucose, capillary     Status: None   Collection Time: 12/10/17 11:33 AM  Result Value Ref Range   Glucose-Capillary 94 70 - 99 mg/dL  Triglycerides     Status: None   Collection Time: 12/10/17  2:22 PM  Result Value Ref Range   Triglycerides 113 <150 mg/dL    Comment: Performed at [facility], [address] [place], [place] [address]  Glucose, capillary     Status: Abnormal   Collection Time: 12/10/17  3:38 PM  Result Value Ref Range   Glucose-Capillary 148 (H) 70 - 99 mg/dL  Glucose, capillary     Status: Abnormal   Collection Time: 12/10/17  7:31 PM  Result Value Ref Range   Glucose-Capillary 151 (H) 70 - 99 mg/dL  Glucose, capillary     Status: Abnormal   Collection Time: 12/10/17 11:24 PM  Result Value Ref Range   Glucose-Capillary 174 (H) 70 - 99 mg/dL  Glucose, capillary     Status: Abnormal   Collection Time: 12/11/17  4:10 AM  Result Value Ref Range   Glucose-Capillary 167 (H) 70 - 99 mg/dL  Heparin level (unfractionated)     Status: None   Collection Time: 12/11/17  4:31 AM  Result Value Ref Range   Heparin Unfractionated 0.40 0.30 - 0.70 IU/mL    Comment: (NOTE) If heparin results are below expected values, and patient dosage has  been confirmed, suggest follow up testing of antithrombin III levels. Performed at [facility], [address] [place], [place] [address]   CBC     Status: Abnormal   Collection Time: 12/11/17  4:31 AM  Result Value Ref Range   WBC 11.3 (H) 3.8 - 10.6 K/uL   RBC 2.95 (L) 4.40 - 5.90 MIL/uL   Hemoglobin 9.7 (L) 13.0 - 18.0 g/dL   HCT 28.8 (L) 40.0 - 52.0 %   MCV 97.7 80.0 - 100.0 fL   MCH 32.9 26.0 - 34.0 pg   MCHC 33.7 32.0 - 36.0 g/dL    RDW 13.3 11.5 - 14.5 %   Platelets 153 150 - 440 K/uL    Comment: Performed at [facility], [address] [place], [place] [address]  Glucose, capillary     Status: Abnormal   Collection Time: 12/11/17  7:14 AM  Result Value Ref Range   Glucose-Capillary 165 (H) 70 - 99 mg/dL  Troponin I     Status: Abnormal   Collection Time: 12/11/17  8:16 AM  Result Value Ref Range   Troponin I 0.79 (HH) <0.03 ng/mL    Comment: CRITICAL VALUE NOTED. VALUE  IS CONSISTENT WITH PREVIOUSLY REPORTED/CALLED VALUE  SDR Performed at [facility], [address] [place], [place] [address]   Culture, respiratory (non-expectorated)     Status: None (Preliminary result)   Collection Time: 12/11/17  9:11 AM  Result Value Ref Range   Specimen Description      SPUTUM Performed at [facility], [address] [place], [place] [address]    Special Requests      NONE Performed at [facility], [place], [place] [address]    Gram Stain      MODERATE WBC PRESENT, PREDOMINANTLY PMN FEW GRAM POSITIVE COCCI RARE GRAM NEGATIVE RODS Performed at [facility], [place] [address] [place], [place] [address]    Culture PENDING    Report Status PENDING   Glucose, capillary     Status: Abnormal   Collection Time: 12/11/17 11:30 AM  Result Value Ref Range   Glucose-Capillary 126 (H) 70 - 99 mg/dL  Glucose, capillary     Status: Abnormal   Collection Time: 12/11/17  4:12 PM  Result Value Ref Range   Glucose-Capillary 108 (H) 70 - 99 mg/dL  . Ct Head Wo Contrast  Result Date: 12/10/2017 CLINICAL DATA:  Altered mental status EXAM: CT HEAD WITHOUT CONTRAST TECHNIQUE: Contiguous axial images were obtained from the base of the skull through the vertex without intravenous contrast. COMPARISON:  Head CT 11/29/2017 FINDINGS: Brain: There is no mass, hemorrhage or extra-axial collection. The size and configuration of the ventricles and extra-axial CSF spaces are  normal. 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 There is hypoattenuation of the periventricular white matter, most commonly indicating chronic ischemic microangiopathy. Vascular: No abnormal hyperdensity of the major intracranial arteries or dural venous sinuses. No intracranial atherosclerosis. Skull: The visualized skull base, calvarium and extracranial soft tissues are normal. Sinuses/Orbits: No fluid levels or advanced mucosal thickening of the visualized paranasal sinuses. No mastoid or middle ear effusion. The orbits are normal. IMPRESSION: Chronic small vessel ischemia without acute intracranial abnormality. Electronically Signed   By: Ulyses Jarred M.D.   On: 12/10/2017 14:23   Mr Thoracic Spine W Wo Contrast  Result Date: 12/10/2017 CLINICAL DATA:  Abnormal T5 lesion on prior MRI. EXAM: MRI THORACIC AND LUMBAR SPINE WITHOUT AND WITH CONTRAST TECHNIQUE: Multiplanar and multiecho pulse sequences of the thoracic and lumbar spine were obtained without and with intravenous contrast. CONTRAST:  6m MULTIHANCE GADOBENATE DIMEGLUMINE 529 MG/ML IV SOLN COMPARISON:  08/22/2017 thoracic and lumbar spine MRI FINDINGS: MRI THORACIC SPINE FINDINGS Alignment:  Physiologic. Vertebrae: The low T1/T2-weighted signal lesion at T5 is unchanged in size. No associated contrast enhancement. Smaller lesion with similar signal characteristics at T6 is also unchanged. 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 No abnormal marrow contrast enhancement. Cord:  Normal Paraspinal and other soft tissues: Medium-sized pleural effusions with associated atelectasis. Disc levels: No disc herniation or stenosis. MRI LUMBAR SPINE FINDINGS Segmentation:  Standard. Alignment:  Physiologic. Vertebrae:  No fracture, evidence of discitis, or bone lesion. Conus medullaris: Extends to the L1 level and appears normal. Paraspinal and other soft tissues: Negative Disc levels: L1-2: Normal. L2-3: Mild disc bulge without stenosis. L3-L4: Intermediate  disc bulge. No spinal canal or neural foraminal stenosis. Normal facets. L4-L5: Intermediate disc bulge and endplate spurring with mild bilateral foraminal stenosis, right worse than left, unchanged. L5-S1: Normal. The visualized sacrum is normal. IMPRESSION: 1. Unchanged appearance of low T1/T2-weighted signal, nonenhancing lesion of T5. While characteristics remain nonspecific, lack of interval  change is reassuring. 2. Mild lower lumbar degenerative disc disease, unchanged. 3. Medium-sized pleural effusions. Electronically Signed   By: Ulyses Jarred M.D.   On: 12/10/2017 14:14   Mr Lumbar Spine W Wo Contrast  Result Date: 12/10/2017 CLINICAL DATA:  Abnormal T5 lesion on prior MRI. EXAM: MRI THORACIC AND LUMBAR SPINE WITHOUT AND WITH CONTRAST TECHNIQUE: Multiplanar and multiecho pulse sequences of the thoracic and lumbar spine were obtained without and with intravenous contrast. CONTRAST:  84m MULTIHANCE GADOBENATE DIMEGLUMINE 529 MG/ML IV SOLN COMPARISON:  08/22/2017 thoracic and lumbar spine MRI FINDINGS: MRI THORACIC SPINE FINDINGS Alignment:  Physiologic. Vertebrae: The low T1/T2-weighted signal lesion at T5 is unchanged in size. No associated contrast enhancement. Smaller lesion with similar signal characteristics at T6 is also unchanged. 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 No abnormal marrow contrast enhancement. Cord:  Normal Paraspinal and other soft tissues: Medium-sized pleural effusions with associated atelectasis. Disc levels: No disc herniation or stenosis. MRI LUMBAR SPINE FINDINGS Segmentation:  Standard. Alignment:  Physiologic. Vertebrae:  No fracture, evidence of discitis, or bone lesion. Conus medullaris: Extends to the L1 level and appears normal. Paraspinal and other soft tissues: Negative Disc levels: L1-2: Normal. L2-3: Mild disc bulge without stenosis. L3-L4: Intermediate disc bulge. No spinal canal or neural foraminal stenosis. Normal facets. L4-L5: Intermediate disc bulge and endplate  spurring with mild bilateral foraminal stenosis, right worse than left, unchanged. L5-S1: Normal. The visualized sacrum is normal. IMPRESSION: 1. Unchanged appearance of low T1/T2-weighted signal, nonenhancing lesion of T5. While characteristics remain nonspecific, lack of interval change is reassuring. 2. Mild lower lumbar degenerative disc disease, unchanged. 3. Medium-sized pleural effusions. Electronically Signed   By: KUlyses JarredM.D.   On: 12/10/2017 14:14   Dg Chest Port 1 View  Result Date: 12/09/2017 CLINICAL DATA:  Encounter for intubation EXAM: PORTABLE CHEST 1 VIEW COMPARISON:  12/09/2017 FINDINGS: Endotracheal tube is 2 cm above the carina. Right PICC line tip is at the cavoatrial junction. NG tube enters the stomach. There is mild vascular congestion and bibasilar atelectasis. Suspect small effusions bilaterally. IMPRESSION: Mild vascular congestion and bibasilar atelectasis along with small bilateral effusions. Vascular congestion worsened since prior study. Electronically Signed   By: KRolm BaptiseM.D.   On: 12/09/2017 19:18  .   ASSESSMENT: Respiratory failure  PLAN: Can begin to look at a time for tracheostomy. Unfortunately no time is available for tomorrow for a morning add on case, though Dr. VPryor Ochoawill recheck the schedule tomorrow and see if they can accommodate an add-on case. Otherwise will need to see what OR time we can find available in the coming days and will post an update.  Critical care time: 60 minutes with chart review, coordination of care.    PRiley Nearing MD 12/11/2017 5:36 PM

## 2017-12-11 NOTE — Consult Note (Signed)
[facility] Cardiology Consultation Note  Patient ID: Marc [name], MRN: [id number], DOB/AGE: [id number] 75 y.o. Admit date: 11/29/2017   Date of Consult: 12/11/2017 Primary Physician: Tracie Harrier, MD Primary Cardiologist: Clabe Seal  Chief Complaint:  Chief Complaint  Patient presents with  . Weakness  . Dizziness   Reason for Consult: Elevated troponin  HPI: 77 y.o. male with no history of cardiovascular disease but multiple risk factors including diabetes hypertension and hyperlipidemia previously and appropriately treated.  The patient has had significant weakness and fatigue in shortness of breath over the last several months with some significant weight loss and dwindling.  The patient has had some altered mental status as well which has been evaluated from the cardiovascular standpoint with an echocardiogram showing normal LV systolic function with ejection fraction of 65% and no evidence of valvular heart disease or heart attack or heart failure.  The patient continued to have difficulty and was admitted to the hospital with continued concerns of weight loss when he had what appeared to be a aspiration pneumonitis.  The patient was intubated and treated appropriately with supportive care and subsequently was tried to extubate at that time the patient did poorly and had some potential mucus plugging.  He had a pulseless electrical activity on which he had ACLS protocol.  At that time the patient did recover from that without evidence of significant other rhythm disturbances.  He did have an abnormal EKG thereafter showing normal sinus rhythm with inferior and lateral T wave inversions which after further evaluation had unchanged since his admission.  Additionally he had a troponin elevation of 1.9 more consistent with demand ischemia rather than acute coronary syndrome.  Currently he is hemodynamically stable but still has had some difficulty with overall improvements from the  respiratory standpoint and currently there is no evidence of congestive heart failure  Past Medical History:  Diagnosis Date  . Anxiety   . BPH (benign prostatic hyperplasia)   . Colon adenomas    history of  . Diverticulosis   . Hyperlipidemia   . Hypertension       Surgical History:  Past Surgical History:  Procedure Laterality Date  . CHOLECYSTECTOMY    . COLONOSCOPY WITH PROPOFOL N/A 01/23/2016   Procedure: COLONOSCOPY WITH PROPOFOL;  Surgeon: Manya Silvas, MD;  Location: [facility] ENDOSCOPY;  Service: Endoscopy;  Laterality: N/A;  . ESOPHAGOGASTRODUODENOSCOPY (EGD) WITH PROPOFOL N/A 11/27/2017   Procedure: ESOPHAGOGASTRODUODENOSCOPY (EGD) WITH PROPOFOL;  Surgeon: Manya Silvas, MD;  Location: [facility] ENDOSCOPY;  Service: Endoscopy;  Laterality: N/A;     Home Meds: Prior to Admission medications   Medication Sig Start Date End Date Taking? Authorizing Provider  carvedilol (COREG) 25 MG tablet Take 25 mg by mouth 2 (two) times daily with a meal.   Yes [provider]  imipramine (TOFRANIL) 50 MG tablet Take 50 mg by mouth at bedtime.   Yes [provider]  megestrol (MEGACE) 40 MG/ML suspension Take 400 mg by mouth 2 (two) times daily.    Yes [provider]  amLODipine (NORVASC) 5 MG tablet Take 1 tablet (5 mg total) by mouth daily. Patient not taking: Reported on 11/29/2017 02/18/15   Loletha Grayer, MD  amoxicillin (AMOXIL) 500 MG tablet Take 2 tablets (1,000 mg total) by mouth 2 (two) times daily for 14 days. 11/29/17 12/13/17  Carrie Mew, MD  carbamide peroxide [facility]) 6.5 % otic solution Place 5 drops into the right ear 2 (two) times daily. Patient not taking:  Reported on 11/29/2017 02/18/15   Loletha Grayer, MD  clarithromycin (BIAXIN) 500 MG tablet Take 1 tablet (500 mg total) by mouth 2 (two) times daily for 14 days. 11/29/17 12/13/17  Carrie Mew, MD  meclizine (ANTIVERT) 25 MG tablet Take 1 tablet (25 mg total) by mouth 3 (three) times  daily. Patient not taking: Reported on 11/29/2017 02/18/15   Loletha Grayer, MD  pantoprazole (PROTONIX) 40 MG tablet Take 1 tablet (40 mg total) by mouth 2 (two) times daily for 14 days. 11/29/17 12/13/17  Carrie Mew, MD    Inpatient Medications:  . amLODipine  10 mg Per Tube Daily  . aspirin  81 mg Per Tube Daily  . chlorhexidine gluconate (MEDLINE KIT)  15 mL Mouth Rinse BID  . clarithromycin  500 mg Per Tube Q12H  . docusate  100 mg Per Tube BID  . feeding supplement (PRO-STAT SUGAR FREE 64)  30 mL Per Tube Daily  . free water  200 mL Per Tube Q4H  . hydrALAZINE  50 mg Oral Q8H  . insulin aspart  0-20 Units Subcutaneous Q4H  . insulin glargine  10 Units Subcutaneous QHS  . mouth rinse  15 mL Mouth Rinse 10 times per day  . metoprolol tartrate  50 mg Per Tube BID  . pantoprazole sodium  40 mg Per Tube BID  . [START ON 12/14/2017] pantoprazole sodium  40 mg Per Tube Daily  . sodium chloride flush  10-40 mL Intracatheter Q12H   . sodium chloride Stopped (12/09/17 2200)  . ampicillin-sulbactam (UNASYN) IV Stopped (12/11/17 1422)  . feeding supplement (VITAL AF 1.2 CAL) 60 mL/hr at 12/11/17 1600  . heparin 1,100 Units/hr (12/11/17 1600)  . phenylephrine (NEO-SYNEPHRINE) Adult infusion 14.933 mcg/min (12/11/17 1600)  . propofol (DIPRIVAN) infusion 19.868 mcg/kg/min (12/11/17 1600)    Allergies:  Allergies  Allergen Reactions  . Glimepiride   . Tape Itching and Rash    Social History   Socioeconomic History  . Marital status: Married    Spouse name: Not on file  . Number of children: Not on file  . Years of education: Not on file  . Highest education level: Not on file  Occupational History  . Not on file  Social Needs  . Financial resource strain: Not on file  . Food insecurity:    Worry: Not on file    Inability: Not on file  . Transportation needs:    Medical: Not on file    Non-medical: Not on file  Tobacco Use  . Smoking status: Never Smoker  . Smokeless  tobacco: Never Used  Substance and Sexual Activity  . Alcohol use: No  . Drug use: No  . Sexual activity: Not on file  Lifestyle  . Physical activity:    Days per week: Not on file    Minutes per session: Not on file  . Stress: Not on file  Relationships  . Social connections:    Talks on phone: Not on file    Gets together: Not on file    Attends religious service: Not on file    Active member of club or organization: Not on file    Attends meetings of clubs or organizations: Not on file    Relationship status: Not on file  . Intimate partner violence:    Fear of current or ex partner: Not on file    Emotionally abused: Not on file    Physically abused: Not on file    Forced sexual activity: Not  on file  Other Topics Concern  . Not on file  Social History Narrative  . Not on file     Family History  Problem Relation Age of Onset  . Hypertension Other   . Diabetes Neg Hx      Review of Systems Positive for not assessed due to intubation  Labs: Recent Labs    12/09/17 2107 12/10/17 0239 12/11/17 0816  TROPONINI 0.44* 1.93* 0.79*   Lab Results  Component Value Date   WBC 11.3 (H) 12/11/2017   HGB 9.7 (L) 12/11/2017   HCT 28.8 (L) 12/11/2017   MCV 97.7 12/11/2017   PLT 153 12/11/2017    Recent Labs  Lab 12/06/17 2035  12/10/17 0239  NA 148*   < > 144  K 4.0   < > 3.8  CL 106   < > 102  CO2 33*   < > 37*  BUN 46*   < > 34*  CREATININE 1.56*   < > 1.15  CALCIUM 8.9   < > 7.9*  PROT 5.9*  --   --   BILITOT 0.7  --   --   ALKPHOS 68  --   --   ALT 31  --   --   AST 40  --   --   GLUCOSE 253*   < > 110*   < > = values in this interval not displayed.   Lab Results  Component Value Date   TRIG 113 12/10/2017   No results found for: DDIMER  Radiology/Studies:  Dg Chest 1 View  Result Date: 11/30/2017 CLINICAL DATA:  o2 decrease EXAM: CHEST  1 VIEW COMPARISON:  11/30/2017 FINDINGS: Endotracheal tube is in place with tip approximately 3.9 centimeters  above the carina. Nasogastric tube is in place, tip coiled back upon itself into the gastric fundal region. There is volume loss at the LEFT lung base, obscuring the LEFT hemidiaphragm. RIGHT lung is clear. No pulmonary edema. IMPRESSION: Persistent significant atelectasis and/or consolidation of the LEFT LOWER lobe. Endotracheal tube and nasogastric tube in good position. No edema. Electronically Signed   By: Nolon Nations M.D.   On: 11/30/2017 18:50   Ct Head Wo Contrast  Result Date: 12/10/2017 CLINICAL DATA:  Altered mental status EXAM: CT HEAD WITHOUT CONTRAST TECHNIQUE: Contiguous axial images were obtained from the base of the skull through the vertex without intravenous contrast. COMPARISON:  Head CT 11/29/2017 FINDINGS: Brain: There is no mass, hemorrhage or extra-axial collection. The size and configuration of the ventricles and extra-axial CSF spaces are normal. 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 There is hypoattenuation of the periventricular white matter, most commonly indicating chronic ischemic microangiopathy. Vascular: No abnormal hyperdensity of the major intracranial arteries or dural venous sinuses. No intracranial atherosclerosis. Skull: The visualized skull base, calvarium and extracranial soft tissues are normal. Sinuses/Orbits: No fluid levels or advanced mucosal thickening of the visualized paranasal sinuses. No mastoid or middle ear effusion. The orbits are normal. IMPRESSION: Chronic small vessel ischemia without acute intracranial abnormality. Electronically Signed   By: Ulyses Jarred M.D.   On: 12/10/2017 14:23   Ct Head Wo Contrast  Result Date: 11/29/2017 CLINICAL DATA:  Unexplained weight loss for several months. Decreased appetite and increasing weakness with unclear speech. Dizziness today. EXAM: CT HEAD WITHOUT CONTRAST TECHNIQUE: Contiguous axial images were obtained from the base of the skull through the vertex without intravenous contrast. COMPARISON:   Head CT and MRI brain 02/16/2015. FINDINGS: Brain: There is no  evidence of acute intracranial hemorrhage, mass lesion, brain edema or extra-axial fluid collection. The ventricles and subarachnoid spaces are appropriately sized for age. There is no CT evidence of acute cortical infarction. Stable minimal periventricular white matter disease. Small lacune or perivascular space in the left lentiform nucleus. Vascular: Mild intracranial vascular calcifications. No hyperdense vessel identified. Skull: Negative for fracture or focal lesion. Sinuses/Orbits: The visualized paranasal sinuses and mastoid air cells are clear. No orbital abnormalities are seen. Other: None. IMPRESSION: No acute intracranial findings. No explanation for the patient's symptoms. Electronically Signed   By: Richardean Sale M.D.   On: 11/29/2017 18:27   Ct Chest W Contrast  Result Date: 11/30/2017 CLINICAL DATA:  Altered mental status. Left pleural effusion on x-Rashaad EXAM: CT CHEST WITH CONTRAST TECHNIQUE: Multidetector CT imaging of the chest was performed during intravenous contrast administration. CONTRAST:  10m OMNIPAQUE IOHEXOL 300 MG/ML  SOLN COMPARISON:  X-Ira earlier today FINDINGS: Cardiovascular: The heart is upper normal to mildly enlarged. No pericardial effusion. Coronary artery calcification is evident. Atherosclerotic calcification is noted in the wall of the thoracic aorta. Mediastinum/Nodes: No mediastinal lymphadenopathy. There is no hilar lymphadenopathy. The esophagus has normal imaging features. There is no axillary lymphadenopathy. Lungs/Pleura: There is minimal dependent mucus in the trachea. Right lower lobe collapse/consolidation evident. Collapse/consolidation also noted left lower lobe. No pleural effusion. Upper Abdomen: There may be a trace amount of fluid along the dome of the spleen. Trace sub pulmonic effusion would also be a consideration. Musculoskeletal: Expansile lytic foci are identified in the T11 vertebral  body but appear to be isolated to this level. These are present on a study from 12/06/2010 and are unchanged. Tiny lucent foci are noted in the manubrium and sternum, indeterminate. IMPRESSION: 1. Bilateral lower lobe collapse/consolidation. 2. Possible tiny left pleural effusion versus a trace amount of fluid along the dome of the spleen. 3.  Aortic Atherosclerois (ICD10-170.0) Electronically Signed   By: EMisty StanleyM.D.   On: 11/30/2017 02:48   Mr Brain Wo Contrast  Result Date: 12/01/2017 CLINICAL DATA:  Poor appetite. Dehydration. Weakness. Weight loss. EXAM: MRI HEAD WITHOUT CONTRAST TECHNIQUE: Multiplanar, multiecho pulse sequences of the brain and surrounding structures were obtained without intravenous contrast. COMPARISON:  CT 11/29/2017 FINDINGS: Brain: Mild age related volume loss. Mild small vessel change of the pons and cerebral hemispheric white matter. No evidence of acute or subacute infarction. No mass lesion, hemorrhage, hydrocephalus or extra-axial collection. Vascular: Major vessels at the base of the brain show flow. Skull and upper cervical spine: Negative Sinuses/Orbits: Clear/normal Other: None IMPRESSION: No acute or reversible finding. No cause of the presenting symptoms is identified. Mild age related volume loss and chronic small-vessel change of the pons and hemispheric white matter. Electronically Signed   By: MNelson ChimesM.D.   On: 12/01/2017 14:10   Mr Thoracic Spine W Wo Contrast  Result Date: 12/10/2017 CLINICAL DATA:  Abnormal T5 lesion on prior MRI. EXAM: MRI THORACIC AND LUMBAR SPINE WITHOUT AND WITH CONTRAST TECHNIQUE: Multiplanar and multiecho pulse sequences of the thoracic and lumbar spine were obtained without and with intravenous contrast. CONTRAST:  160mMULTIHANCE GADOBENATE DIMEGLUMINE 529 MG/ML IV SOLN COMPARISON:  08/22/2017 thoracic and lumbar spine MRI FINDINGS: MRI THORACIC SPINE FINDINGS Alignment:  Physiologic. Vertebrae: The low T1/T2-weighted signal  lesion at T5 is unchanged in size. No associated contrast enhancement. Smaller lesion with similar signal characteristics at T6 is also unchanged. 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 No abnormal marrow contrast enhancement. Cord:  Normal Paraspinal and other soft tissues: Medium-sized pleural effusions with associated atelectasis. Disc levels: No disc herniation or stenosis. MRI LUMBAR SPINE FINDINGS Segmentation:  Standard. Alignment:  Physiologic. Vertebrae:  No fracture, evidence of discitis, or bone lesion. Conus medullaris: Extends to the L1 level and appears normal. Paraspinal and other soft tissues: Negative Disc levels: L1-2: Normal. L2-3: Mild disc bulge without stenosis. L3-L4: Intermediate disc bulge. No spinal canal or neural foraminal stenosis. Normal facets. L4-L5: Intermediate disc bulge and endplate spurring with mild bilateral foraminal stenosis, right worse than left, unchanged. L5-S1: Normal. The visualized sacrum is normal. IMPRESSION: 1. Unchanged appearance of low T1/T2-weighted signal, nonenhancing lesion of T5. While characteristics remain nonspecific, lack of interval change is reassuring. 2. Mild lower lumbar degenerative disc disease, unchanged. 3. Medium-sized pleural effusions. Electronically Signed   By: Ulyses Jarred M.D.   On: 12/10/2017 14:14   Mr Lumbar Spine W Wo Contrast  Result Date: 12/10/2017 CLINICAL DATA:  Abnormal T5 lesion on prior MRI. EXAM: MRI THORACIC AND LUMBAR SPINE WITHOUT AND WITH CONTRAST TECHNIQUE: Multiplanar and multiecho pulse sequences of the thoracic and lumbar spine were obtained without and with intravenous contrast. CONTRAST:  9m MULTIHANCE GADOBENATE DIMEGLUMINE 529 MG/ML IV SOLN COMPARISON:  08/22/2017 thoracic and lumbar spine MRI FINDINGS: MRI THORACIC SPINE FINDINGS Alignment:  Physiologic. Vertebrae: The low T1/T2-weighted signal lesion at T5 is unchanged in size. No associated contrast enhancement. Smaller lesion with similar signal  characteristics at T6 is also unchanged. 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 No abnormal marrow contrast enhancement. Cord:  Normal Paraspinal and other soft tissues: Medium-sized pleural effusions with associated atelectasis. Disc levels: No disc herniation or stenosis. MRI LUMBAR SPINE FINDINGS Segmentation:  Standard. Alignment:  Physiologic. Vertebrae:  No fracture, evidence of discitis, or bone lesion. Conus medullaris: Extends to the L1 level and appears normal. Paraspinal and other soft tissues: Negative Disc levels: L1-2: Normal. L2-3: Mild disc bulge without stenosis. L3-L4: Intermediate disc bulge. No spinal canal or neural foraminal stenosis. Normal facets. L4-L5: Intermediate disc bulge and endplate spurring with mild bilateral foraminal stenosis, right worse than left, unchanged. L5-S1: Normal. The visualized sacrum is normal. IMPRESSION: 1. Unchanged appearance of low T1/T2-weighted signal, nonenhancing lesion of T5. While characteristics remain nonspecific, lack of interval change is reassuring. 2. Mild lower lumbar degenerative disc disease, unchanged. 3. Medium-sized pleural effusions. Electronically Signed   By: KUlyses JarredM.D.   On: 12/10/2017 14:14   UKoreaVenous Img Upper Uni Right  Result Date: 12/03/2017 CLINICAL DATA:  Right upper extremity pain and edema. Evaluate for DVT. EXAM: RIGHT UPPER EXTREMITY VENOUS DOPPLER ULTRASOUND TECHNIQUE: Gray-scale sonography with graded compression, as well as color Doppler and duplex ultrasound were performed to evaluate the upper extremity deep venous system from the level of the subclavian vein and including the jugular, axillary, basilic, radial, ulnar and upper cephalic vein. Spectral Doppler was utilized to evaluate flow at rest and with distal augmentation maneuvers. COMPARISON:  Chest radiograph-12/02/2018 FINDINGS: Contralateral Subclavian Vein: Respiratory phasicity is normal and symmetric with the symptomatic side. No evidence of thrombus. Normal  compressibility. Internal Jugular Vein: No evidence of thrombus. Normal compressibility, respiratory phasicity and response to augmentation. Subclavian Vein: There is nonocclusive DVT surrounding the right upper extremity approach PICC line at the level of the peripheral aspect of the right subclavian vein (images 10 and 11). Axillary Vein: No evidence of thrombus. Normal compressibility, respiratory phasicity and response to augmentation. Cephalic Vein: There is mixed echogenic occlusive thrombus involving the distal aspect  of the cephalic vein (image 18 through 21). The proximal aspect of the cephalic vein appears widely patent (image 16). Basilic Vein: No evidence of thrombus. Normal compressibility, respiratory phasicity and response to augmentation. Brachial Veins: No evidence of thrombus. Normal compressibility, respiratory phasicity and response to augmentation. Radial Veins: No evidence of thrombus though rouleaux flow was demonstrated. Normal compressibility, respiratory phasicity and response to augmentation. Ulnar Veins: No evidence of thrombus. Normal compressibility, respiratory phasicity and response to augmentation. Venous Reflux:  None visualized. Other Findings:  None visualized. IMPRESSION: 1. The examination is positive for nonocclusive DVT surrounding the right upper extremity approach PICC line at level peripheral aspect of the right subclavian vein. 2. Examination is positive for occlusive SVT involving the cephalic vein at the level the mid and distal humerus. Electronically Signed   By: Sandi Mariscal M.D.   On: 12/03/2017 15:03   Dg Chest Port 1 View  Result Date: 12/09/2017 CLINICAL DATA:  Encounter for intubation EXAM: PORTABLE CHEST 1 VIEW COMPARISON:  12/09/2017 FINDINGS: Endotracheal tube is 2 cm above the carina. Right PICC line tip is at the cavoatrial junction. NG tube enters the stomach. There is mild vascular congestion and bibasilar atelectasis. Suspect small effusions  bilaterally. IMPRESSION: Mild vascular congestion and bibasilar atelectasis along with small bilateral effusions. Vascular congestion worsened since prior study. Electronically Signed   By: Rolm Baptise M.D.   On: 12/09/2017 19:18   Dg Chest Port 1 View  Result Date: 12/09/2017 CLINICAL DATA:  Atelectasis EXAM: PORTABLE CHEST 1 VIEW COMPARISON:  December 08, 2017 FINDINGS: Central catheter tip is in the right atrium. Nasogastric tube tip and side port are below the diaphragm. No pneumothorax. There are small pleural effusions bilaterally with mild bibasilar atelectatic change. There is no frank edema or consolidation. Heart is upper normal in size with pulmonary vascularity normal. No adenopathy. No bone lesions. IMPRESSION: Tube and catheter positions as described without pneumothorax. Small pleural effusions bilaterally with mild bibasilar atelectasis. No consolidation. Stable cardiac silhouette. Electronically Signed   By: Lowella Grip III M.D.   On: 12/09/2017 07:15   Dg Chest Port 1 View  Result Date: 12/08/2017 CLINICAL DATA:  Atelectasis EXAM: PORTABLE CHEST 1 VIEW COMPARISON:  Two days ago FINDINGS: An orogastric tube and side-port reaches the stomach which appears decompressed. Right upper extremity PICC in good position. Artifact from EKG leads. Low volume chest with hazy opacities at both bases. On chest CT 11/30/2017 there is atelectasis at the bases. No Kerley lines or pneumothorax. Normal heart size for technique. IMPRESSION: Stable atelectasis with possible small volume pleural fluid at the bases. Electronically Signed   By: Monte Fantasia M.D.   On: 12/08/2017 07:13   Dg Chest Port 1 View  Result Date: 12/06/2017 CLINICAL DATA:  Dyspnea. EXAM: PORTABLE CHEST 1 VIEW COMPARISON:  12/05/2017 and older exams. FINDINGS: Since the prior study, the endotracheal tube and nasogastric tube have been removed. Right PICC is stable and well positioned. There is opacity at both lung bases similar on  the right to the prior exam, increased on the left, consistent with small effusions and associated atelectasis. Remainder of the lungs is clear. No pneumothorax. Cardiac silhouette is normal in size. IMPRESSION: 1. Mild worsening in lung aeration at the left lung base with persistent right lung base opacity. Basilar lung opacities are most likely combination of small effusions and atelectasis. 2. Status post extubation and removal of the nasogastric tube since the prior exam. Electronically Signed   By: Shanon Brow  Ormond M.D.   On: 12/06/2017 20:04   Dg Chest Port 1 View  Result Date: 12/05/2017 CLINICAL DATA:  Acute onset of respiratory failure. EXAM: PORTABLE CHEST 1 VIEW COMPARISON:  Chest radiograph performed 12/01/2017 FINDINGS: The patient's endotracheal tube is seen ending 6 cm above the carina. An enteric tube is noted extending below the diaphragm. A right PICC is noted ending about the distal SVC. Bibasilar airspace opacities, right greater than left, may reflect pneumonia. A small left pleural effusion is noted. No pneumothorax is seen. An apparent calcified granuloma is noted at the right midlung zone. The cardiomediastinal silhouette is normal in size. No acute osseous abnormalities are identified. IMPRESSION: 1. Endotracheal tube seen ending 6 cm above the carina. 2. Bibasilar airspace opacities, right greater than left, may reflect pneumonia. Small left pleural effusion noted. Electronically Signed   By: Garald Balding M.D.   On: 12/05/2017 00:43   Dg Chest Port 1 View  Result Date: 12/01/2017 CLINICAL DATA:  Check endotracheal tube EXAM: PORTABLE CHEST 1 VIEW COMPARISON:  11/30/2017 FINDINGS: Right-sided PICC line, endotracheal tube and nasogastric catheter are again seen and stable. Persistent changes in the left retrocardiac region are noted although some mild improved aeration is seen. No bony abnormality is noted. IMPRESSION: Improving left basilar infiltrate. Tubes and lines as described.  Electronically Signed   By: Inez Catalina M.D.   On: 12/01/2017 11:47   Dg Chest Portable 1 View  Result Date: 11/30/2017 CLINICAL DATA:  Endotracheal tube placement and OG tube placement. Unexplained weight loss. Increased weakness. Became unresponsive this morning. EXAM: PORTABLE CHEST 1 VIEW COMPARISON:  Chest x-Mallie dated 11/30/2017. FINDINGS: OG tube is adequately positioned in the stomach with tip directed towards the stomach fundus. Endotracheal tube is adequately positioned with tip approximately 5 cm above the carina. Dense consolidation at the LEFT lung base, as seen on today's earlier chest CT, atelectasis versus pneumonia. Mild atelectasis at the RIGHT lung base, as also seen on today's earlier chest CT. No new lung findings in the short-term interval. No pneumothorax seen. IMPRESSION: 1. Endotracheal tube is adequately positioned with tip approximately 5 cm above the carina. 2. OG tube is adequately positioned in the stomach with tip directed towards the stomach fundus. 3. Bibasilar consolidations, better demonstrated on today's earlier chest CT, compatible with atelectasis, pneumonia or aspiration. Electronically Signed   By: Franki Cabot M.D.   On: 11/30/2017 09:53   Dg Chest Port 1 View  Result Date: 11/30/2017 CLINICAL DATA:  Weakness. EXAM: PORTABLE CHEST 1 VIEW COMPARISON:  None. FINDINGS: Lung volumes are low. Patchy left basilar opacity with left pleural effusion. Normal heart size and mediastinal contours allowing for technique and rotation. Mild right infrahilar atelectasis. No pulmonary edema. No pneumothorax. IMPRESSION: Patchy left basilar opacity and left pleural effusion. Considerations include effusion causing adjacent atelectasis or pneumonia with parapneumonic effusion. Recommend radiographic follow-up. Electronically Signed   By: Jeb Levering M.D.   On: 11/30/2017 00:18   Dg Abd Portable 1v  Result Date: 12/07/2017 CLINICAL DATA:  Nasogastric tube placement. EXAM:  PORTABLE ABDOMEN - 1 VIEW COMPARISON:  Abdominal radiograph performed earlier today at 12:01 p.m. FINDINGS: The patient's enteric tube is noted ending overlying the body of the stomach. The visualized bowel gas pattern is grossly unremarkable. Clips are noted within the right upper quadrant, reflecting prior cholecystectomy. A small left pleural effusion is noted. No acute osseous abnormalities are identified. IMPRESSION: Enteric tube noted ending overlying the body of the stomach. Electronically Signed  By: Garald Balding M.D.   On: 12/07/2017 00:14   Dg Abd Portable 1v  Result Date: 12/06/2017 CLINICAL DATA:  New Dobbhoff tube placement EXAM: PORTABLE ABDOMEN - 1 VIEW COMPARISON:  None. FINDINGS: The Dobbhoff tube terminates in the distal stomach. Recommend advancement. Small bilateral pleural effusions and underlying atelectasis. IMPRESSION: The Dobbhoff tube terminates in the distal stomach. Recommend repositioning. Electronically Signed   By: Dorise Bullion III M.D   On: 12/06/2017 12:43   [place] Ekg Site Rite  Result Date: 11/30/2017 If Site Rite image not attached, placement could not be confirmed due to current cardiac rhythm.  Dg Fluoro Guided Loc Of Needle/cath Tip For Spinal Inject Lt  Result Date: 12/02/2017 CLINICAL DATA:  Suspected meningitis.  Encephalopathy. EXAM: DIAGNOSTIC LUMBAR PUNCTURE UNDER FLUOROSCOPIC GUIDANCE FLUOROSCOPY TIME:  Fluoroscopy Time:  18 seconds Radiation Exposure Index (if provided by the fluoroscopic device): 1.1 mGy Number of Acquired Spot Images: 0 PROCEDURE: Informed consent was obtained from the patient prior to the procedure, including potential complications of headache, allergy, and pain. With the patient prone, the lower back was prepped with Betadine. 1% Lidocaine was used for local anesthesia. Lumbar puncture was performed at the L3-4 level using a 22 gauge needle with return of clear CSF. 9 ml of CSF were obtained for laboratory studies. The patient tolerated  the procedure well and there were no apparent complications. IMPRESSION: Successful fluoroscopic guided lumbar puncture. Electronically Signed   By: Kathreen Devoid   On: 12/02/2017 10:07    EKG: Normal sinus rhythm with inferolateral inferolateral T wave inversions  Weights: Filed Weights   12/08/17 0500 12/09/17 0600 12/11/17 0738  Weight: 54.8 kg 60.4 kg 62.5 kg     Physical Exam: Blood pressure 120/69, pulse 100, temperature 99.9 F (37.7 C), temperature source Axillary, resp. rate (!) 22, height 5' 9"  (1.753 m), weight 62.5 kg, SpO2 100 %. Body mass index is 20.35 kg/m. General: Ill-appearing but l developed, not well nourished, in no acute distress. Head eyes ears nose throat: Normocephalic, atraumatic, sclera non-icteric, no xanthomas, nares are without discharge. No apparent thyromegaly and/or mass  Lungs: Normal respiratory effort.  Some wheezes, no rales, few's rhonchi.  Heart: RRR with normal S1 S2. no murmur gallop, no rub, PMI is normal size and placement, carotid upstroke normal without bruit, jugular venous pressure is normal Abdomen: Soft, non-tender, non-distended with normoactive bowel sounds. No hepatomegaly. No rebound/guarding. No obvious abdominal masses. Abdominal aorta is normal size without bruit Extremities: Trace edema. no cyanosis, no clubbing, no ulcers  Peripheral : 2+ bilateral upper extremity pulses, 2+ bilateral femoral pulses, 2+ bilateral dorsal pedal pulse Neuro: Not alert and oriented. No facial asymmetry. No focal deficit. Moves all extremities spontaneously. Musculoskeletal: Normal muscle tone without kyphosis Psych: Does not responds to questions appropriately with a normal affect.    Assessment: 77 year old with significant cardiovascular risk factors of diabetes hypertension hyperlipidemia previously well treated with altered mental status and aspiration pneumonia with pulseless electrical activity status post ACLS protocol and an elevated troponin  most consistent with demand ischemia rather than acute coronary syndrome  Plan: 1.  Continue supportive care of aspiration pneumonia mucous plugging and status post ACLS protocol for pulseless electrical activity most consistent with mucous plug 2.  Further cardiac diagnostics necessary at this time 3.  Continue medication management for further prevention measures including heparin hypertension control and high intensity cholesterol therapy as able 4.  Proceed to further supportive care including feeding and possible long-term care if  necessary watching for any worsening symptoms or issues although currently no evidence of heart failure or acute coronary syndromes requiring further intervention  Signed, Corey Skains M.D. [facility] 12/11/2017, 5:06 PM

## 2017-12-11 NOTE — Progress Notes (Signed)
Pharmacy Antibiotic Note  Marc [name] is a 77 y.o. male admitted on 11/29/2017 with AMS. Patient was re-intubated subsequent to cardiac arrest 8/13.  Pharmacy has been consulted for Unasyn dosing for aspiration pnemonia. Patient has been on H pylori regimen consisting of clarithromycin, amoxicillin, and pantoprazole that ends 8/16.  Plan: Unasyn 3 g iv q 6 hours.  Height: 5\' 9"  (175.3 cm) Weight: 137 lb 12.6 oz (62.5 kg) IBW/kg (Calculated) : 70.7  Temp (24hrs), Avg:99.9 F (37.7 C), Min:99 F (37.2 C), Max:100.8 F (38.2 C)  Recent Labs  Lab 12/06/17 2035  12/08/17 0601 12/08/17 2245 12/09/17 0527 12/09/17 1856 12/10/17 0239 12/11/17 0431  WBC  --    < > 8.1  --  5.3 12.2* 14.3* 11.3*  CREATININE 1.56*   < > 1.12 1.01 1.23 1.20 1.15  --   LATICACIDVEN 1.8  --   --   --   --   --   --   --    < > = values in this interval not displayed.    Estimated Creatinine Clearance: 49.1 mL/min (by C-G formula based on SCr of 1.15 mg/dL).    Allergies  Allergen Reactions  . Glimepiride   . Tape Itching and Rash    Antimicrobials this admission: Zosyn 8/2 >> 8/3 vancomycin 8/2 >> 8/6 Acyclovir 8/3 >> 8/8 Ampicillin/amoxicillin 8/3 >> 8/11 clarithormycin 8/6 >> 8/16 Ceftriaxone 8/3 >> 8/5 Unasyn 8/14 >>  Dose adjustments this admission:   Microbiology results: 8/5 BCx: NG 8/2 BCx: NG 8/2 UCx: insignificant growth  8/5 CSF: NG 8/3 MRSA PCR: negative 8/13 UCx: NG 8/13 BCx: NGTD 8/13 TA: moderate S aureus   Thank you for allowing pharmacy to be a part of this patient's care.  Ulice Dash D 12/11/2017 12:15 PM

## 2017-12-11 NOTE — Progress Notes (Signed)
CRITICAL CARE NOTE  CC  follow up generalized weakness with respiratory failure  SUBJECTIVE Patient remains critically ill Prognosis is guarded  77 year old male with acute encephalopathy, sepsis, and pneumonia who was admitted on 8/2. Patient with respiratory and cardiac arrest on 8/12 leading to re-intubation. This morning, patient remains intubated and sedated. He responds to voice, but does not follow commands.   SIGNIFICANT EVENTS  Overnight, patient very restless and agitated per nursing report.   BP (!) 106/58   Pulse (!) 101   Temp 99 F (37.2 C) (Oral)   Resp 15   Ht 5' 9"  (1.753 m)   Wt 60.4 kg   SpO2 100%   BMI 19.66 kg/m    REVIEW OF SYSTEMS  PATIENT IS UNABLE TO PROVIDE COMPLETE REVIEW OF SYSTEM S DUE TO SEVERE CRITICAL ILLNESS AND ENCEPHALOPATHY   PHYSICAL EXAMINATION:  GENERAL:thin and frail elderly male, critically ill appearing, +resp distress HEAD: Normocephalic, atraumatic.  EYES: Pupils equal, round, reactive to light.  No scleral icterus.  MOUTH: Moist mucosal membrane. NECK: Supple. No thyromegaly. No nodules. No JVD.  PULMONARY: +rhonchi bilaterally.  CARDIOVASCULAR: S1 and S2. Regular rate and rhythm. No murmurs, rubs, or gallops.  GASTROINTESTINAL: Soft, nontender, -distended. No masses. Positive bowel sounds. No hepatosplenomegaly.  MUSCULOSKELETAL: No swelling, clubbing, or edema.  NEUROLOGIC: obtunded, GCS<8 SKIN:intact,warm,dry  ASSESSMENT AND PLAN SYNOPSIS  77 year old male admitted on 8/2 for generalized weakness and acute encephalopathy. Patient was intubated on 8/3 due to sepsis and pneumonia. Extubated on 8/8 and re-intubated on 8/12 following cardiac arrest. Tracheostomy and peg tube orders have been placed.    Severe Hypoxic Respiratory Failure secondary to suspected aspiration -continue Full MV support -continue DuoNeb PRN -Wean Fio2 and PEEP as tolerated -will perform SAT/SBt when respiratory parameters are met -aspiration  pneumonia: start unasyn   Renal: -no current concerns   NEUROLOGY - intubated and sedated with propofol - minimal sedation to achieve a RASS goal: -1 -CT head 8/13 without acute abnormality  -MRI of lumbar and thoracic spine:   -T5 and T6 lesions unchanged from previous studies  -L3-L5 with bulging discs/mild degenerative disc disease which is unchanged  from previous studies.  -Multiple labs drawn and pending to test for concern of progressive neuromuscular disorder.   Gastrointestinal: -h. Pylori: continue triple therapy through 8/16  -clarithromycin, amoxil, protonix -continue tube feeds as tolerated   CARDIAC -wean neo-synephrine as tolerated, MAP goal >65 -DVT and SVT of right upper arm: continue heparin drip -new onset atrial fibrillation: controlled -elevated troponin: 1.93-->0.79  -will cycle troponin, EKG concerning for ischemia -cardio consult placed for kernoodle: has seen Dr. Saralyn Pilar in the past   ID -leukocytosis: 12.2-->14.3-->11.3 -follow up cultures  -resp culture: +staph aureus   -start unasyn for aspiration pneumonia  -blood culture, no growth x 1 day   DVT: heparin GI PRX: protonix  TRANSFUSIONS AS NEEDED MONITOR FSBS ASSESS the need for LABS as needed   Critical Care Time devoted to patient care services described in this note is 26 minutes.   Overall, patient is critically ill, prognosis is guarded.  Patient with Multiorgan failure and at high risk for cardiac arrest and death.

## 2017-12-11 NOTE — Consult Note (Signed)
GI Inpatient Consult Note  Reason for Consult: PEG tube consult   Attending Requesting Consult: Dr. Tressia Miners, MD  History of Present Illness: Marc [name] is a 77 y.o. male seen for evaluation of PEG tube consult at the request of Dr. Tressia Miners, MD. Pt had EGD on 07/31 and was found to have H pylori gastritis and started on triple therapy. He was admitted 08/02 with acute encephalopathy, sepsis, and aspiration pneumonia. He was extubated 08/08 but re-intubated 08/12 s/t cardiac arrest.  Pt currently has severe hypoxic respiratory failure s/t suspected aspiration and is currently on Unasyn. He has had chest x-Auther, CT scan, and MRI scans which are unrevealing. Pt is currently intubated, sedated and ventilator dependent. Wife is at bedside.  Patient seen and examined this afternoon slightly agitated in hospital bed. Wife is bedside and helps with history and answering questions. She reports he has had more endotracheal secretions than normal the past day with a fever last night. She has been told there is a concern for a possible progressive neuromuscular disorder and several labs are pending. She reports pt has been having significant unintentional weight loss over the past several months. He has been receiving tube feeds through his NG tube. He has pulled out his NG tube once. He is agitated. He has a prior appendectomy and cholecystectomy but not hx of major abdominal surgeries. No hx of liver disease or abdominal ascites. No overt bleeding.    Last Colonoscopy: 12/2015 Last Endoscopy: 11/27/2017   Past Medical History:  Past Medical History:  Diagnosis Date  . Anxiety   . BPH (benign prostatic hyperplasia)   . Colon adenomas    history of  . Diverticulosis   . Hyperlipidemia   . Hypertension     Problem List: Patient Active Problem List   Diagnosis Date Noted  . Protein-calorie malnutrition, severe 12/03/2017  . Altered mental status 11/29/2017  . Hypertensive urgency 02/16/2015  .  Vertigo 02/16/2015    Past Surgical History: Past Surgical History:  Procedure Laterality Date  . CHOLECYSTECTOMY    . COLONOSCOPY WITH PROPOFOL N/A 01/23/2016   Procedure: COLONOSCOPY WITH PROPOFOL;  Surgeon: Manya Silvas, MD;  Location: [facility] ENDOSCOPY;  Service: Endoscopy;  Laterality: N/A;  . ESOPHAGOGASTRODUODENOSCOPY (EGD) WITH PROPOFOL N/A 11/27/2017   Procedure: ESOPHAGOGASTRODUODENOSCOPY (EGD) WITH PROPOFOL;  Surgeon: Manya Silvas, MD;  Location: [facility] ENDOSCOPY;  Service: Endoscopy;  Laterality: N/A;    Allergies: Allergies  Allergen Reactions  . Glimepiride   . Tape Itching and Rash    Home Medications: Medications Prior to Admission  Medication Sig Dispense Refill Last Dose  . carvedilol (COREG) 25 MG tablet Take 25 mg by mouth 2 (two) times daily with a meal.   01/23/2016 at 0530  . imipramine (TOFRANIL) 50 MG tablet Take 50 mg by mouth at bedtime.   01/22/2016 at Unknown time  . megestrol (MEGACE) 40 MG/ML suspension Take 400 mg by mouth 2 (two) times daily.      Marland Kitchen amLODipine (NORVASC) 5 MG tablet Take 1 tablet (5 mg total) by mouth daily. (Patient not taking: Reported on 11/29/2017) 30 tablet 0 Not Taking at Unknown time  . carbamide peroxide (DEBROX) 6.5 % otic solution Place 5 drops into the right ear 2 (two) times daily. (Patient not taking: Reported on 11/29/2017) 15 mL 0 Not Taking at Unknown time  . meclizine (ANTIVERT) 25 MG tablet Take 1 tablet (25 mg total) by mouth 3 (three) times daily. (Patient not taking: Reported on 11/29/2017)  30 tablet 0 Not Taking at Unknown time   Home medication reconciliation was completed with the patient.   Scheduled Inpatient Medications:   . amLODipine  10 mg Per Tube Daily  . aspirin  81 mg Per Tube Daily  . chlorhexidine gluconate (MEDLINE KIT)  15 mL Mouth Rinse BID  . clarithromycin  500 mg Per Tube Q12H  . docusate  100 mg Per Tube BID  . feeding supplement (PRO-STAT SUGAR FREE 64)  30 mL Per Tube Daily  . free water  200  mL Per Tube Q4H  . hydrALAZINE  50 mg Oral Q8H  . insulin aspart  0-20 Units Subcutaneous Q4H  . insulin glargine  10 Units Subcutaneous QHS  . mouth rinse  15 mL Mouth Rinse 10 times per day  . metoprolol tartrate  50 mg Per Tube BID  . pantoprazole sodium  40 mg Per Tube BID  . [START ON 12/14/2017] pantoprazole sodium  40 mg Per Tube Daily  . sodium chloride flush  10-40 mL Intracatheter Q12H    Continuous Inpatient Infusions:   . sodium chloride Stopped (12/09/17 2200)  . ampicillin-sulbactam (UNASYN) IV 3 g (12/11/17 1352)  . feeding supplement (VITAL AF 1.2 CAL) 60 mL/hr at 12/11/17 1200  . heparin 1,100 Units/hr (12/11/17 1353)  . phenylephrine (NEO-SYNEPHRINE) Adult infusion 14.933 mcg/min (12/11/17 1200)  . propofol (DIPRIVAN) infusion 19.868 mcg/kg/min (12/11/17 1200)    PRN Inpatient Medications:  sodium chloride, acetaminophen **OR** acetaminophen, bisacodyl, fentaNYL (SUBLIMAZE) injection, ipratropium-albuterol, labetalol, lip balm, phenol, sodium chloride flush  Family History: family history includes Hypertension in his other.  The patient's family history is negative for inflammatory bowel disorders, GI malignancy, or solid organ transplantation.  Social History:   reports that he has never smoked. He has never used smokeless tobacco. He reports that he does not drink alcohol or use drugs. The patient denies ETOH, tobacco, or drug use.   Review of Systems: Unable to complete s/t critical illness    Physical Examination: BP (!) 103/54 (BP Location: Left Arm)   Pulse 86   Temp (!) 100.6 F (38.1 C) (Rectal)   Resp 16   Ht _0  (1.753 m)   Wt 62.5 kg   SpO2 97%   BMI 20.35 kg/m  Gen: Pt in respiratory distress, intubated and sedated HEENT: PEERLA, EOMI, Neck: supple, no JVD or thyromegaly Chest: rhonchi present bilaterally CV: RRR, no m/g/c/r Abd: soft, NT, ND, +BS in all four quadrants; no HSM, guarding, ridigity, or rebound tenderness Ext: no edema,  well perfused with 2+ pulses, Skin: no rash or lesions noted Lymph: no LAD  Data: Lab Results  Component Value Date   WBC 11.3 (H) 12/11/2017   HGB 9.7 (L) 12/11/2017   HCT 28.8 (L) 12/11/2017   MCV 97.7 12/11/2017   PLT 153 12/11/2017   Recent Labs  Lab 12/09/17 1856 12/10/17 0239 12/11/17 0431  HGB 12.9* 11.9* 9.7*   Lab Results  Component Value Date   NA 144 12/10/2017   K 3.8 12/10/2017   CL 102 12/10/2017   CO2 37 (H) 12/10/2017   BUN 34 (H) 12/10/2017   CREATININE 1.15 12/10/2017   Lab Results  Component Value Date   ALT 31 12/06/2017   AST 40 12/06/2017   ALKPHOS 68 12/06/2017   BILITOT 0.7 12/06/2017   Recent Labs  Lab 12/09/17 1856  APTT 63*  INR 1.08   Assessment/Plan: 76 y/o Caucasian male admitted 08/02 for acute encephalopathy with unknown etiology. GI  was consulted for possible PEG tube placement  1. Acute encephalopathy, ventilatory dependent respiratory failure suspected due to recurrent aspiration 2. Dysphonia, dysphagia 3. H pylori gastritis: Triple therapy to be completed 08/16 - Unfortunate case of acute encephalopathy with known etiology, labs pending. - Had thorough discussion with patient's wife about indications, contraindications, and details about PEG tube. All of her questions were answered. We discussed potential complications, including bleeding, infection, small puncture to internal organs, gastrocolic fistula, and anesthesia complications.  - ENT consult pending regarding tracheostomy. Ideally would like tracheostomy to be completed first before PEG.  - We will continue to follow along   Thank you for the consult. Please call with questions or concerns.  Geanie Kenning, PA-C [place]

## 2017-12-11 NOTE — Progress Notes (Addendum)
Subjective: Unchanged.  Patient remains intubated and sedated.  Agitated when sedation decreased and moving all extremities.  Objective: Current vital signs: BP (!) 103/54 (BP Location: Left Arm)   Pulse 86   Temp (!) 100.6 F (38.1 C) (Rectal)   Resp 16   Ht 5' 9"  (1.753 m)   Wt 62.5 kg   SpO2 97%   BMI 20.35 kg/m  Vital signs in last 24 hours: Temp:  [99 F (37.2 C)-100.8 F (38.2 C)] 100.6 F (38.1 C) (08/14 1200) Pulse Rate:  [82-109] 86 (08/14 1200) Resp:  [11-23] 16 (08/14 1200) BP: (67-131)/(42-92) 103/54 (08/14 1200) SpO2:  [92 %-100 %] 97 % (08/14 1200) FiO2 (%):  [40 %-60 %] 40 % (08/14 1129) Weight:  [62.5 kg] 62.5 kg (08/14 0738)  Intake/Output from previous day: 08/13 0701 - 08/14 0700 In: 2252.4 [I.V.:532.4; NG/GT:1720] Out: 1710 [Urine:1710] Intake/Output this shift: Total I/O In: 510.9 [I.V.:150.9; NG/GT:360] Out: 200 [Urine:200] Nutritional status:  Diet Order    None      Neurologic Exam: Mental Status: Opens eyes with stimulation.  Does not follow commands.  No verbalizations noted.  Cranial Nerves: II: patient does not respond confrontation bilaterally, pupils right 3 mm, left 3 mm,and reactive bilaterally III,IV,VI: doll's response present bilaterally.  V,VII: corneal reflex present bilaterally  VIII: patient does not respond to verbal stimuli IX,X: gag reflex reduced, XI: trapezius strength unable to test bilaterally XII: tongue strength unable to test Motor: Moves upper extremities spontaneously    Lab Results: Basic Metabolic Panel: Recent Labs  Lab 12/06/17 2035 12/07/17 0526  12/08/17 0601 12/08/17 2245 12/09/17 0527 12/09/17 1856 12/09/17 2107 12/10/17 0239  NA 148* 151*   < > 150* 140 144 142  --  144  K 4.0 3.0*   < > 3.2* 3.3* 4.3 3.4*  --  3.8  CL 106 104   < > 104 103 105 97*  --  102  CO2 33* 37*   < > 35* 33* 34* 34*  --  37*  GLUCOSE 253* 249*   < > 224* 155* 159* 283*  --  110*  BUN 46* 38*   < > 33* 31* 32*  32*  --  34*  CREATININE 1.56* 1.31*   < > 1.12 1.01 1.23 1.20  --  1.15  CALCIUM 8.9 9.1   < > 8.5* 7.7* 8.1* 7.9*  --  7.9*  MG 1.9 1.9  --  1.8  --  2.0 2.1 1.7  --   PHOS 4.2 2.6  --  3.5  --  2.0*  --   --  3.7   < > = values in this interval not displayed.    Liver Function Tests: Recent Labs  Lab 12/06/17 2035  AST 40  ALT 31  ALKPHOS 68  BILITOT 0.7  PROT 5.9*  ALBUMIN 2.7*   No results for input(s): LIPASE, AMYLASE in the last 168 hours. Recent Labs  Lab 12/06/17 2035  AMMONIA 13    CBC: Recent Labs  Lab 12/06/17 0503  12/08/17 0601 12/09/17 0527 12/09/17 1856 12/10/17 0239 12/11/17 0431  WBC 6.2   < > 8.1 5.3 12.2* 14.3* 11.3*  NEUTROABS 4.8  --  6.4 3.8  --   --   --   HGB 11.6*   < > 14.1 11.5* 12.9* 11.9* 9.7*  HCT 34.0*   < > 42.1 34.6* 39.3* 34.8* 28.8*  MCV 97.7   < > 97.7 97.5 99.6 96.6 97.7  PLT  128*   < > 212 181 247 236 153   < > = values in this interval not displayed.    Cardiac Enzymes: Recent Labs  Lab 12/07/17 1648 12/08/17 1020 12/09/17 2107 12/10/17 0239 12/11/17 0816  CKMB  --  2.2  --   --   --   TROPONINI 0.98* 0.52* 0.44* 1.93* 0.79*    Lipid Panel: Recent Labs  Lab 12/10/17 1422  TRIG 113    CBG: Recent Labs  Lab 12/10/17 1931 12/10/17 2324 12/11/17 0410 12/11/17 0714 12/11/17 1130  GLUCAP 151* 174* 167* 165* 126*    Microbiology: Results for orders placed or performed during the hospital encounter of 11/29/17  Urine culture     Status: Abnormal   Collection Time: 11/29/17 11:32 PM  Result Value Ref Range Status   Specimen Description   Final    URINE, RANDOM Performed at [facility], [address] [place], [place] [address]    Special Requests   Final    NONE Performed at [facility], [address], [place], [place] [address]    Culture (A)  Final    <10,000 COLONIES/mL INSIGNIFICANT GROWTH Performed at [facility], [place] [address], [place], [place] [address]     Report Status 12/01/2017 FINAL  Final  Blood Culture (routine x 2)     Status: None   Collection Time: 11/29/17 11:32 PM  Result Value Ref Range Status   Specimen Description BLOOD LEFT ANTECUBITAL  Final   Special Requests   Final    BOTTLES DRAWN AEROBIC AND ANAEROBIC Blood Culture adequate volume   Culture   Final    NO GROWTH 5 DAYS Performed at [facility], [address] [place], [place] [address]    Report Status 12/04/2017 FINAL  Final  Blood Culture (routine x 2)     Status: None   Collection Time: 11/29/17 11:32 PM  Result Value Ref Range Status   Specimen Description BLOOD RIGHT ANTECUBITAL  Final   Special Requests   Final    BOTTLES DRAWN AEROBIC AND ANAEROBIC Blood Culture adequate volume   Culture   Final    NO GROWTH 5 DAYS Performed at [facility], [address] [place], [place] [address]    Report Status 12/04/2017 FINAL  Final  MRSA PCR Screening     Status: None   Collection Time: 11/30/17 11:59 AM  Result Value Ref Range Status   MRSA by PCR NEGATIVE NEGATIVE Final    Comment:        The GeneXpert MRSA Assay (FDA approved for NASAL specimens only), is one component of a comprehensive MRSA colonization surveillance program. It is not intended to diagnose MRSA infection nor to guide or monitor treatment for MRSA infections. Performed at [facility], [address] [place], [place] [address]   Culture, blood (routine x 2)     Status: None   Collection Time: 12/02/17  8:08 AM  Result Value Ref Range Status   Specimen Description BLOOD BLOOD RIGHT HAND  Final   Special Requests   Final    BOTTLES DRAWN AEROBIC AND ANAEROBIC Blood Culture adequate volume   Culture   Final    NO GROWTH 5 DAYS Performed at [facility], [address] [place], [place] [address]    Report Status 12/07/2017 FINAL  Final  Culture, blood (routine x 2)     Status: None   Collection Time: 12/02/17  8:08 AM  Result Value Ref  Range Status   Specimen Description BLOOD BLOOD RIGHT WRIST  Final   Special Requests   Final    BOTTLES DRAWN AEROBIC AND ANAEROBIC Blood Culture results may not be optimal due to an inadequate volume of blood received in culture bottles   Culture   Final    NO GROWTH 5 DAYS Performed at [facility], [address], [place], [place] [address]    Report Status 12/07/2017 FINAL  Final  CSF culture     Status: None   Collection Time: 12/02/17  9:40 AM  Result Value Ref Range Status   Specimen Description   Final    CSF Performed at [place] [address] [place], [place] [address]    Special Requests   Final    NONE Performed at [facility], [place], [place] [address]    Gram Stain   Final    RARE WBC SEEN MODERATE RED BLOOD CELLS NO ORGANISMS SEEN    Culture   Final    NO GROWTH 3 DAYS Performed at [facility], [place] [address], [place], [place] [address]    Report Status 12/05/2017 FINAL  Final  Culture, fungus without smear     Status: None (Preliminary result)   Collection Time: 12/02/17  9:40 AM  Result Value Ref Range Status   Specimen Description   Final    CSF Performed at [facility], [address] [place], [place] [address]    Special Requests   Final    NONE Performed at [facility], [address], [place], [place] [address]    Culture   Final    NO FUNGUS ISOLATED AFTER 7 DAYS Performed at [facility], [place] [address], [place], [place] [address]    Report Status PENDING  Incomplete  Urine Culture     Status: None   Collection Time: 12/10/17 12:42 AM  Result Value Ref Range Status   Specimen Description   Final    URINE, RANDOM Performed at [facility], [address] [place], [place] [address]    Special Requests   Final    NONE Performed at [facility], [address] [place], [place] [address]    Culture   Final    NO GROWTH Performed at  [facility], [place] [address], [place], [place] [address]    Report Status 12/11/2017 FINAL  Final  CULTURE, BLOOD (ROUTINE X 2) w Reflex to ID Panel     Status: None (Preliminary result)   Collection Time: 12/10/17  2:39 AM  Result Value Ref Range Status   Specimen Description BLOOD LEFT [facility]  Final   Special Requests   Final    BOTTLES DRAWN AEROBIC AND ANAEROBIC Blood Culture adequate volume   Culture   Final    NO GROWTH 1 DAY Performed at [facility], [address], [place], [place] [address]    Report Status PENDING  Incomplete  CULTURE, BLOOD (ROUTINE X 2) w Reflex to ID Panel     Status: None (Preliminary result)   Collection Time: 12/10/17  2:39 AM  Result Value Ref Range Status   Specimen Description BLOOD LEFT HAND  Final   Special Requests   Final    BOTTLES DRAWN AEROBIC AND ANAEROBIC Blood Culture adequate volume   Culture   Final    NO GROWTH 1 DAY Performed at [facility],  [place], [place] [address]    Report Status PENDING  Incomplete  Culture, respiratory (non-expectorated)     Status: None (Preliminary result)   Collection Time: 12/10/17  2:55 AM  Result Value Ref Range Status   Specimen Description   Final    TRACHEAL ASPIRATE Performed at [facility], [address], [place], [place] [address]    Special Requests   Final    NONE Performed at [facility] [place], [address] [place], [place] [address]    Gram Stain   Final    RARE WBC PRESENT,BOTH PMN AND MONONUCLEAR FEW GRAM POSITIVE COCCI IN PAIRS Performed at [facility], [place] [address] [place], [place] [address]    Culture MODERATE STAPHYLOCOCCUS AUREUS  Final   Report Status PENDING  Incomplete    Coagulation Studies: Recent Labs    12/09/17 1856  LABPROT 13.9  INR 1.08    Imaging: Ct Head Wo Contrast  Result Date: 12/10/2017 CLINICAL DATA:  Altered mental status EXAM: CT HEAD WITHOUT CONTRAST TECHNIQUE: Contiguous  axial images were obtained from the base of the skull through the vertex without intravenous contrast. COMPARISON:  Head CT 11/29/2017 FINDINGS: Brain: There is no mass, hemorrhage or extra-axial collection. The size and configuration of the ventricles and extra-axial CSF spaces are normal. 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 There is hypoattenuation of the periventricular white matter, most commonly indicating chronic ischemic microangiopathy. Vascular: No abnormal hyperdensity of the major intracranial arteries or dural venous sinuses. No intracranial atherosclerosis. Skull: The visualized skull base, calvarium and extracranial soft tissues are normal. Sinuses/Orbits: No fluid levels or advanced mucosal thickening of the visualized paranasal sinuses. No mastoid or middle ear effusion. The orbits are normal. IMPRESSION: Chronic small vessel ischemia without acute intracranial abnormality. Electronically Signed   By: Ulyses Jarred M.D.   On: 12/10/2017 14:23   Mr Thoracic Spine W Wo Contrast  Result Date: 12/10/2017 CLINICAL DATA:  Abnormal T5 lesion on prior MRI. EXAM: MRI THORACIC AND LUMBAR SPINE WITHOUT AND WITH CONTRAST TECHNIQUE: Multiplanar and multiecho pulse sequences of the thoracic and lumbar spine were obtained without and with intravenous contrast. CONTRAST:  34m MULTIHANCE GADOBENATE DIMEGLUMINE 529 MG/ML IV SOLN COMPARISON:  08/22/2017 thoracic and lumbar spine MRI FINDINGS: MRI THORACIC SPINE FINDINGS Alignment:  Physiologic. Vertebrae: The low T1/T2-weighted signal lesion at T5 is unchanged in size. No associated contrast enhancement. Smaller lesion with similar signal characteristics at T6 is also unchanged. 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 No abnormal marrow contrast enhancement. Cord:  Normal Paraspinal and other soft tissues: Medium-sized pleural effusions with associated atelectasis. Disc levels: No disc herniation or stenosis. MRI LUMBAR SPINE FINDINGS Segmentation:   Standard. Alignment:  Physiologic. Vertebrae:  No fracture, evidence of discitis, or bone lesion. Conus medullaris: Extends to the L1 level and appears normal. Paraspinal and other soft tissues: Negative Disc levels: L1-2: Normal. L2-3: Mild disc bulge without stenosis. L3-L4: Intermediate disc bulge. No spinal canal or neural foraminal stenosis. Normal facets. L4-L5: Intermediate disc bulge and endplate spurring with mild bilateral foraminal stenosis, right worse than left, unchanged. L5-S1: Normal. The visualized sacrum is normal. IMPRESSION: 1. Unchanged appearance of low T1/T2-weighted signal, nonenhancing lesion of T5. While characteristics remain nonspecific, lack of interval change is reassuring. 2. Mild lower lumbar degenerative disc disease, unchanged. 3. Medium-sized pleural effusions. Electronically Signed   By: KUlyses JarredM.D.   On: 12/10/2017 14:14   Mr Lumbar Spine W Wo Contrast  Result  Date: 12/10/2017 CLINICAL DATA:  Abnormal T5 lesion on prior MRI. EXAM: MRI THORACIC AND LUMBAR SPINE WITHOUT AND WITH CONTRAST TECHNIQUE: Multiplanar and multiecho pulse sequences of the thoracic and lumbar spine were obtained without and with intravenous contrast. CONTRAST:  46m MULTIHANCE GADOBENATE DIMEGLUMINE 529 MG/ML IV SOLN COMPARISON:  08/22/2017 thoracic and lumbar spine MRI FINDINGS: MRI THORACIC SPINE FINDINGS Alignment:  Physiologic. Vertebrae: The low T1/T2-weighted signal lesion at T5 is unchanged in size. No associated contrast enhancement. Smaller lesion with similar signal characteristics at T6 is also unchanged. 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 No abnormal marrow contrast enhancement. Cord:  Normal Paraspinal and other soft tissues: Medium-sized pleural effusions with associated atelectasis. Disc levels: No disc herniation or stenosis. MRI LUMBAR SPINE FINDINGS Segmentation:  Standard. Alignment:  Physiologic. Vertebrae:  No fracture, evidence of discitis, or bone lesion. Conus medullaris:  Extends to the L1 level and appears normal. Paraspinal and other soft tissues: Negative Disc levels: L1-2: Normal. L2-3: Mild disc bulge without stenosis. L3-L4: Intermediate disc bulge. No spinal canal or neural foraminal stenosis. Normal facets. L4-L5: Intermediate disc bulge and endplate spurring with mild bilateral foraminal stenosis, right worse than left, unchanged. L5-S1: Normal. The visualized sacrum is normal. IMPRESSION: 1. Unchanged appearance of low T1/T2-weighted signal, nonenhancing lesion of T5. While characteristics remain nonspecific, lack of interval change is reassuring. 2. Mild lower lumbar degenerative disc disease, unchanged. 3. Medium-sized pleural effusions. Electronically Signed   By: KUlyses JarredM.D.   On: 12/10/2017 14:14   Dg Chest Port 1 View  Result Date: 12/09/2017 CLINICAL DATA:  Encounter for intubation EXAM: PORTABLE CHEST 1 VIEW COMPARISON:  12/09/2017 FINDINGS: Endotracheal tube is 2 cm above the carina. Right PICC line tip is at the cavoatrial junction. NG tube enters the stomach. There is mild vascular congestion and bibasilar atelectasis. Suspect small effusions bilaterally. IMPRESSION: Mild vascular congestion and bibasilar atelectasis along with small bilateral effusions. Vascular congestion worsened since prior study. Electronically Signed   By: KRolm BaptiseM.D.   On: 12/09/2017 19:18    Medications:  I have reviewed the patient's current medications. Scheduled: . amLODipine  10 mg Per Tube Daily  . aspirin  81 mg Per Tube Daily  . chlorhexidine gluconate (MEDLINE KIT)  15 mL Mouth Rinse BID  . clarithromycin  500 mg Per Tube Q12H  . docusate  100 mg Per Tube BID  . feeding supplement (PRO-STAT SUGAR FREE 64)  30 mL Per Tube Daily  . free water  200 mL Per Tube Q4H  . hydrALAZINE  50 mg Oral Q8H  . insulin aspart  0-20 Units Subcutaneous Q4H  . insulin glargine  10 Units Subcutaneous QHS  . mouth rinse  15 mL Mouth Rinse 10 times per day  . metoprolol  tartrate  50 mg Per Tube BID  . pantoprazole sodium  40 mg Per Tube BID  . [START ON 12/14/2017] pantoprazole sodium  40 mg Per Tube Daily  . sodium chloride flush  10-40 mL Intracatheter Q12H   Continuous: . sodium chloride Stopped (12/09/17 2200)  . ampicillin-sulbactam (UNASYN) IV    . feeding supplement (VITAL AF 1.2 CAL) 60 mL/hr at 12/11/17 1200  . heparin 1,100 Units/hr (12/11/17 1200)  . phenylephrine (NEO-SYNEPHRINE) Adult infusion 14.933 mcg/min (12/11/17 1200)  . propofol (DIPRIVAN) infusion 19.868 mcg/kg/min (12/11/17 1200)    Assessment/Plan: Patient unchanged.  MRI of the lumbar and thoracic spines reviewed.  Previously noted lesions unchanged.  No uptake of contrast noted.  Head CT reviewed  and shows no acute changes.  Discussions underway about possible placement of trach and PEG.  Unable to fully address mental status today but if no improvement (patient previously not following commands after arrest) would perform MRI of the brain without contrast before final decision.  Lab work pending.      LOS: 12 days   Alexis Goodell, MD Neurology [phone] 12/11/2017  1:12 PM  Addendum: 45 minutes spent in conversation with patient's son and wife concerning patient's condition, management and prognosis.    Alexis Goodell, MD Neurology [phone]

## 2017-12-11 NOTE — Care Management (Signed)
RNCM met with wife again and son was present [phone]. Per progression rounds in ICU patient has consult for g-tube and trach placement and that patient would be a good candidate for LTAC.  [facility] can offer bed; [facility] is reviewing.  Choice provided to both patient's wife and her son for review. They currently do not have any questions.

## 2017-12-11 NOTE — Progress Notes (Signed)
ANTICOAGULATION CONSULT NOTE  Pharmacy Consult for heparin drip management  Indication: DVT  Patient Measurements: Height: 5\' 9"  (175.3 cm) Weight: 133 lb 2.5 oz (60.4 kg) IBW/kg (Calculated) : 70.7   Vital Signs: Temp: 99 F (37.2 C) (08/14 0345) Temp Source: Oral (08/14 0345) BP: 109/62 (08/14 0630) Pulse Rate: 103 (08/14 0630)  Labs: Recent Labs    12/08/17 1020  12/09/17 0527 12/09/17 1856 12/09/17 2107 12/10/17 0239 12/11/17 0431  HGB  --    < > 11.5* 12.9*  --  11.9* 9.7*  HCT  --    < > 34.6* 39.3*  --  34.8* 28.8*  PLT  --    < > 181 247  --  236 153  APTT  --   --   --  63*  --   --   --   LABPROT  --   --   --  13.9  --   --   --   INR  --   --   --  1.08  --   --   --   HEPARINUNFRC  --   --  0.37  --   --  0.52 0.40  CREATININE  --    < > 1.23 1.20  --  1.15  --   CKMB 2.2  --   --   --   --   --   --   TROPONINI 0.52*  --   --   --  0.44* 1.93*  --    < > = values in this interval not displayed.    Estimated Creatinine Clearance: 47.4 mL/min (by C-G formula based on SCr of 1.15 mg/dL).  Medical History: Past Medical History:  Diagnosis Date  . Anxiety   . BPH (benign prostatic hyperplasia)   . Colon adenomas    history of  . Diverticulosis   . Hyperlipidemia   . Hypertension    Assessment: Pharmacy consulted for heparin drip management for 77 yo male admitted with AMS and being treated for DVT of right upper extremety. Patient remains sedated and requiring mechanical ventilation. 8/8 AM heparin level 0.29. Patient was given 900 unit bolus and rate was increased to 1100 units/hr.   Goal of Therapy:  Heparin level 0.3-0.7 units/ml Monitor platelets by anticoagulation protocol: Yes   08/08 1430 HL therapeutic @ 0.47. Will continue with current heparin rate of 1100 units/hr. Will recheck confirmatory level in 8 hours. CBC with AM labs per protocol.   Plan:  08/14 @ 0430 HL 0.40 therapeutic. Will continue at rate of 1100 units/hr and will recheck  next anti-Xa w/ am labs. hgb down 11.9 >> 9.7, per RN patient is not bleeding, but will continue to monitor.  Tobie Lords, PharmD Clinical Pharmacist 12/11/2017 7:07 AM

## 2017-12-11 NOTE — Progress Notes (Signed)
[facility] [place] NAME: Marc [name]    MR#:  [id number]  DATE OF BIRTH:  [birth year]  SUBJECTIVE:  CHIEF COMPLAINT:   Chief Complaint  Patient presents with  . Weakness  . Dizziness   -Intubated and sedated.  Remains on IV heparin drip. -No significant improvement noted  REVIEW OF SYSTEMS:  Review of Systems  Unable to perform ROS: Critical illness    DRUG ALLERGIES:   Allergies  Allergen Reactions  . Glimepiride   . Tape Itching and Rash    VITALS:  Blood pressure (!) 103/54, pulse 86, temperature (!) 100.6 F (38.1 C), temperature source Rectal, resp. rate 16, height 5\' 9"  (1.753 m), weight 62.5 kg, SpO2 97 %.  PHYSICAL EXAMINATION:  Physical Exam   GENERAL:  77 y.o.-year-old patient lying in the bed, ill-appearing,  EYES: Pupils equal, round, reactive to light and accommodation. No scleral icterus. Extraocular muscles intact.  HEENT: Head atraumatic, normocephalic. Oropharynx and nasopharynx clear.  Orally intubated NECK:  Supple, no jugular venous distention. No thyroid enlargement, no tenderness.  LUNGS: Normal breath sounds bilaterally, no wheezing, rales  or crepitation. No use of accessory muscles of respiration.  Bibasilar rhonchi heard. CARDIOVASCULAR: S1, S2 normal. No  rubs, or gallops.  2/6 systolic murmur is present ABDOMEN: Soft, nontender, nondistended. Bowel sounds present. No organomegaly or mass.  EXTREMITIES: No pedal edema, cyanosis, or clubbing.  NEUROLOGIC: Alert and confused, using both arms and reaching out for things. PSYCHIATRIC: The patient is alert but very confused SKIN: No obvious rash, lesion, or ulcer.    LABORATORY PANEL:   CBC Recent Labs  Lab 12/11/17 0431  WBC 11.3*  HGB 9.7*  HCT 28.8*  PLT 153   ------------------------------------------------------------------------------------------------------------------  Chemistries  Recent Labs  Lab 12/06/17 2035  12/09/17 2107  12/10/17 0239  NA 148*   < >  --  144  K 4.0   < >  --  3.8  CL 106   < >  --  102  CO2 33*   < >  --  37*  GLUCOSE 253*   < >  --  110*  BUN 46*   < >  --  34*  CREATININE 1.56*   < >  --  1.15  CALCIUM 8.9   < >  --  7.9*  MG 1.9   < > 1.7  --   AST 40  --   --   --   ALT 31  --   --   --   ALKPHOS 68  --   --   --   BILITOT 0.7  --   --   --    < > = values in this interval not displayed.   ------------------------------------------------------------------------------------------------------------------  Cardiac Enzymes Recent Labs  Lab 12/11/17 0816  TROPONINI 0.79*   ------------------------------------------------------------------------------------------------------------------  RADIOLOGY:  Ct Head Wo Contrast  Result Date: 12/10/2017 CLINICAL DATA:  Altered mental status EXAM: CT HEAD WITHOUT CONTRAST TECHNIQUE: Contiguous axial images were obtained from the base of the skull through the vertex without intravenous contrast. COMPARISON:  Head CT 11/29/2017 FINDINGS: Brain: There is no mass, hemorrhage or extra-axial collection. The size and configuration of the ventricles and extra-axial CSF spaces are normal. 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 There is hypoattenuation of the periventricular white matter, most commonly indicating chronic ischemic microangiopathy. Vascular: No abnormal hyperdensity of the major intracranial arteries or dural venous sinuses. No intracranial atherosclerosis. Skull:  The visualized skull base, calvarium and extracranial soft tissues are normal. Sinuses/Orbits: No fluid levels or advanced mucosal thickening of the visualized paranasal sinuses. No mastoid or middle ear effusion. The orbits are normal. IMPRESSION: Chronic small vessel ischemia without acute intracranial abnormality. Electronically Signed   By: Ulyses Jarred M.D.   On: 12/10/2017 14:23   Mr Thoracic Spine W Wo Contrast  Result Date: 12/10/2017 CLINICAL DATA:  Abnormal  T5 lesion on prior MRI. EXAM: MRI THORACIC AND LUMBAR SPINE WITHOUT AND WITH CONTRAST TECHNIQUE: Multiplanar and multiecho pulse sequences of the thoracic and lumbar spine were obtained without and with intravenous contrast. CONTRAST:  74mL MULTIHANCE GADOBENATE DIMEGLUMINE 529 MG/ML IV SOLN COMPARISON:  08/22/2017 thoracic and lumbar spine MRI FINDINGS: MRI THORACIC SPINE FINDINGS Alignment:  Physiologic. Vertebrae: The low T1/T2-weighted signal lesion at T5 is unchanged in size. No associated contrast enhancement. Smaller lesion with similar signal characteristics at T6 is also unchanged. 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 No abnormal marrow contrast enhancement. Cord:  Normal Paraspinal and other soft tissues: Medium-sized pleural effusions with associated atelectasis. Disc levels: No disc herniation or stenosis. MRI LUMBAR SPINE FINDINGS Segmentation:  Standard. Alignment:  Physiologic. Vertebrae:  No fracture, evidence of discitis, or bone lesion. Conus medullaris: Extends to the L1 level and appears normal. Paraspinal and other soft tissues: Negative Disc levels: L1-2: Normal. L2-3: Mild disc bulge without stenosis. L3-L4: Intermediate disc bulge. No spinal canal or neural foraminal stenosis. Normal facets. L4-L5: Intermediate disc bulge and endplate spurring with mild bilateral foraminal stenosis, right worse than left, unchanged. L5-S1: Normal. The visualized sacrum is normal. IMPRESSION: 1. Unchanged appearance of low T1/T2-weighted signal, nonenhancing lesion of T5. While characteristics remain nonspecific, lack of interval change is reassuring. 2. Mild lower lumbar degenerative disc disease, unchanged. 3. Medium-sized pleural effusions. Electronically Signed   By: Ulyses Jarred M.D.   On: 12/10/2017 14:14   Mr Lumbar Spine W Wo Contrast  Result Date: 12/10/2017 CLINICAL DATA:  Abnormal T5 lesion on prior MRI. EXAM: MRI THORACIC AND LUMBAR SPINE WITHOUT AND WITH CONTRAST TECHNIQUE: Multiplanar and  multiecho pulse sequences of the thoracic and lumbar spine were obtained without and with intravenous contrast. CONTRAST:  62mL MULTIHANCE GADOBENATE DIMEGLUMINE 529 MG/ML IV SOLN COMPARISON:  08/22/2017 thoracic and lumbar spine MRI FINDINGS: MRI THORACIC SPINE FINDINGS Alignment:  Physiologic. Vertebrae: The low T1/T2-weighted signal lesion at T5 is unchanged in size. No associated contrast enhancement. Smaller lesion with similar signal characteristics at T6 is also unchanged. 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 No abnormal marrow contrast enhancement. Cord:  Normal Paraspinal and other soft tissues: Medium-sized pleural effusions with associated atelectasis. Disc levels: No disc herniation or stenosis. MRI LUMBAR SPINE FINDINGS Segmentation:  Standard. Alignment:  Physiologic. Vertebrae:  No fracture, evidence of discitis, or bone lesion. Conus medullaris: Extends to the L1 level and appears normal. Paraspinal and other soft tissues: Negative Disc levels: L1-2: Normal. L2-3: Mild disc bulge without stenosis. L3-L4: Intermediate disc bulge. No spinal canal or neural foraminal stenosis. Normal facets. L4-L5: Intermediate disc bulge and endplate spurring with mild bilateral foraminal stenosis, right worse than left, unchanged. L5-S1: Normal. The visualized sacrum is normal. IMPRESSION: 1. Unchanged appearance of low T1/T2-weighted signal, nonenhancing lesion of T5. While characteristics remain nonspecific, lack of interval change is reassuring. 2. Mild lower lumbar degenerative disc disease, unchanged. 3. Medium-sized pleural effusions. Electronically Signed   By: Ulyses Jarred M.D.   On: 12/10/2017 14:14   Dg Chest Port 1 View  Result Date: 12/09/2017  CLINICAL DATA:  Encounter for intubation EXAM: PORTABLE CHEST 1 VIEW COMPARISON:  12/09/2017 FINDINGS: Endotracheal tube is 2 cm above the carina. Right PICC line tip is at the cavoatrial junction. NG tube enters the stomach. There is mild vascular congestion and  bibasilar atelectasis. Suspect small effusions bilaterally. IMPRESSION: Mild vascular congestion and bibasilar atelectasis along with small bilateral effusions. Vascular congestion worsened since prior study. Electronically Signed   By: Rolm Baptise M.D.   On: 12/09/2017 19:18    EKG:   Orders placed or performed during the hospital encounter of 11/29/17  . ED EKG  . ED EKG  . EKG 12-Lead  . EKG 12-Lead  . EKG 12-Lead  . EKG 12-Lead  . EKG 12-Lead  . EKG 12-Lead  . EKG 12-Lead  . EKG 12-Lead  . EKG 12-Lead  . EKG 12-Lead  . EKG 12-Lead  . EKG 12-Lead  . EKG 12-Lead    ASSESSMENT AND PLAN:   77 year old male with past medical history significant for hypertension, hyperlipidemia and recent diagnosis of H. pylori gastritis on 11/27/2017 presents to hospital from home secondary to weakness, confusion.  1.  Acute respiratory failure- Initially intubated for possible aspiration, extubated however reintubated on 12/09/2017. -Chest x-Broox with pulmonary vascular congestion as well. -Vent management per intensivist.  Recommend IV Lasix.  Echocardiogram if not done recently  -also concern for aspiration pneumonia.  Started on Unasyn  2.  Acute encephalopathy-appreciate neurology consult.  Initially considered to be secondary to PRES syndrome -MRI of the brain showing mild volume loss and chronic ischemic changes, no acute findings. -Lumbar puncture was negative -Patient still encephalopathic.  MRI of the lumbar spine and thoracic spine ordered for lower extremity weakness and repeat CT of the head -Also myasthenia gravis labs, labs for para-neoplastic syndrome ordered - however currently sedated- if no improvement recommend repeat MRI brain  3.  H. pylori gastritis-diagnosed with biopsy from recent endoscopy about 2 weeks ago. -Continue treatment with Protonix, amoxicillin and clarithromycin  4.  Hypertension-labile blood pressures. -Continue metoprolol and Norvasc for now and  monitor  5.  Right upper extremity DVT-has a PICC line currently in that arm. -Nonocclusive DVT in the right subclavian and an occlusive DVT in the right cephalic vein seen. -on IV heparin drip for now   Critically ill, guarded prognosis   All the records are reviewed and case discussed with Care Management/Social Workerr. Management plans discussed with the patient, family and they are in agreement.  CODE STATUS: Full Code  TOTAL TIME TAKING CARE OF THIS PATIENT: 35 minutes.   POSSIBLE D/C IN ? DAYS, DEPENDING ON CLINICAL CONDITION.   Gladstone Lighter M.D on 12/11/2017 at 3:04 PM  Between 7am to 6pm - Pager - [phone]  After 6pm go to [email or web address] - password [email or web address]  Office  [phone]  CC: Primary care physician; Tracie Harrier, MD

## 2017-12-12 ENCOUNTER — Inpatient Hospital Stay: Payer: PPO

## 2017-12-12 ENCOUNTER — Encounter: Payer: Self-pay | Admitting: Anesthesiology

## 2017-12-12 DIAGNOSIS — R401 Stupor: Secondary | ICD-10-CM

## 2017-12-12 LAB — BASIC METABOLIC PANEL
ANION GAP: 9 (ref 5–15)
BUN: 28 mg/dL — ABNORMAL HIGH (ref 8–23)
CO2: 38 mmol/L — ABNORMAL HIGH (ref 22–32)
CREATININE: 1.02 mg/dL (ref 0.61–1.24)
Calcium: 7.7 mg/dL — ABNORMAL LOW (ref 8.9–10.3)
Chloride: 99 mmol/L (ref 98–111)
GFR calc non Af Amer: >60 mL/min (ref >60)
Glucose, Bld: 78 mg/dL (ref 70–99)
Potassium: 2.9 mmol/L — ABNORMAL LOW (ref 3.5–5.1)
SODIUM: 146 mmol/L — AB (ref 135–145)

## 2017-12-12 LAB — BLOOD GAS, ARTERIAL
ACID-BASE EXCESS: 17.9 mmol/L — AB (ref 0.0–2.0)
Bicarbonate: 43.1 mmol/L — ABNORMAL HIGH (ref 20.0–28.0)
FIO2: 0.4
MECHANICAL RATE: 15
O2 Saturation: 95.7 %
PATIENT TEMPERATURE: 37
PEEP/CPAP: 5 cmH2O
PH ART: 7.51 — AB (ref 7.350–7.450)
VT: 500 mL
pCO2 arterial: 54 mmHg — ABNORMAL HIGH (ref 32.0–48.0)
pO2, Arterial: 72 mmHg — ABNORMAL LOW (ref 83.0–108.0)

## 2017-12-12 LAB — GLUCOSE, CAPILLARY
GLUCOSE-CAPILLARY: 210 mg/dL — AB (ref 70–99)
Glucose-Capillary: 84 mg/dL (ref 70–99)
Glucose-Capillary: 60 mg/dL — ABNORMAL LOW (ref 70–99)
Glucose-Capillary: 170 mg/dL — ABNORMAL HIGH (ref 70–99)
Glucose-Capillary: 192 mg/dL — ABNORMAL HIGH (ref 70–99)
Glucose-Capillary: 201 mg/dL — ABNORMAL HIGH (ref 70–99)

## 2017-12-12 LAB — CBC
HEMATOCRIT: 26.7 % — AB (ref 40.0–52.0)
HEMOGLOBIN: 9.3 g/dL — AB (ref 13.0–18.0)
MCH: 33.7 pg (ref 26.0–34.0)
MCHC: 34.7 g/dL (ref 32.0–36.0)
MCV: 96.8 fL (ref 80.0–100.0)
Platelets: 157 10*3/uL (ref 150–440)
RBC: 2.76 MIL/uL — AB (ref 4.40–5.90)
RDW: 13.2 % (ref 11.5–14.5)
WBC: 10.6 10*3/uL (ref 3.8–10.6)

## 2017-12-12 LAB — CULTURE, RESPIRATORY

## 2017-12-12 LAB — CULTURE, RESPIRATORY W GRAM STAIN

## 2017-12-12 LAB — PHOSPHORUS: Phosphorus: 2.8 mg/dL (ref 2.5–4.6)

## 2017-12-12 LAB — HEPARIN LEVEL (UNFRACTIONATED)
HEPARIN UNFRACTIONATED: 0.31 [IU]/mL (ref 0.30–0.70)
HEPARIN UNFRACTIONATED: 0.25 [IU]/mL — AB (ref 0.30–0.70)

## 2017-12-12 LAB — MAGNESIUM: Magnesium: 1.7 mg/dL (ref 1.7–2.4)

## 2017-12-12 LAB — TROPONIN I: TROPONIN I: 0.51 ng/mL — AB (ref <0.03)

## 2017-12-12 LAB — POTASSIUM
Potassium: 3.2 mmol/L — ABNORMAL LOW (ref 3.5–5.1)
Potassium: 3.8 mmol/L (ref 3.5–5.1)

## 2017-12-12 MED ORDER — POTASSIUM CHLORIDE 20 MEQ/15ML (10%) PO SOLN
40.0000 meq | Freq: Once | ORAL | Status: AC
  Administered 2017-12-12: 40 meq via ORAL
  Filled 2017-12-12: qty 30

## 2017-12-12 MED ORDER — INSULIN GLARGINE 100 UNIT/ML ~~LOC~~ SOLN
10.0000 [IU] | Freq: Every day | SUBCUTANEOUS | Status: DC
  Administered 2017-12-12 – 2017-12-21 (×7): 10 [IU] via SUBCUTANEOUS
  Filled 2017-12-12 (×11): qty 0.1

## 2017-12-12 MED ORDER — DEXTROSE 5 % IV SOLN
INTRAVENOUS | Status: DC
  Administered 2017-12-12: 10:00:00 via INTRAVENOUS

## 2017-12-12 MED ORDER — POTASSIUM CHLORIDE 20 MEQ/15ML (10%) PO SOLN
40.0000 meq | Freq: Two times a day (BID) | ORAL | Status: AC
Start: 2017-12-12 — End: 2017-12-12
  Administered 2017-12-12 (×2): 40 meq
  Filled 2017-12-12 (×2): qty 30

## 2017-12-12 MED ORDER — VANCOMYCIN HCL IN DEXTROSE 750-5 MG/150ML-% IV SOLN
750.0000 mg | Freq: Two times a day (BID) | INTRAVENOUS | Status: DC
  Administered 2017-12-12 – 2017-12-16 (×8): 750 mg via INTRAVENOUS
  Filled 2017-12-12 (×9): qty 150

## 2017-12-12 MED ORDER — POTASSIUM CHLORIDE 10 MEQ/50ML IV SOLN
10.0000 meq | INTRAVENOUS | Status: AC
  Administered 2017-12-12 (×3): 10 meq via INTRAVENOUS
  Filled 2017-12-12 (×3): qty 50

## 2017-12-12 MED ORDER — POTASSIUM CHLORIDE 10 MEQ/50ML IV SOLN
10.0000 meq | INTRAVENOUS | Status: DC
  Administered 2017-12-12 (×3): 10 meq via INTRAVENOUS
  Filled 2017-12-12 (×6): qty 50

## 2017-12-12 MED ORDER — HEPARIN BOLUS VIA INFUSION
1000.0000 [IU] | Freq: Once | INTRAVENOUS | Status: AC
  Administered 2017-12-12: 1000 [IU] via INTRAVENOUS
  Filled 2017-12-12: qty 1000

## 2017-12-12 MED ORDER — INSULIN ASPART 100 UNIT/ML ~~LOC~~ SOLN
0.0000 [IU] | SUBCUTANEOUS | Status: DC
  Administered 2017-12-12: 5 [IU] via SUBCUTANEOUS
  Administered 2017-12-12 – 2017-12-13 (×3): 3 [IU] via SUBCUTANEOUS
  Administered 2017-12-13: 2 [IU] via SUBCUTANEOUS
  Administered 2017-12-13: 5 [IU] via SUBCUTANEOUS
  Administered 2017-12-14: 3 [IU] via SUBCUTANEOUS
  Administered 2017-12-14: 2 [IU] via SUBCUTANEOUS
  Administered 2017-12-14: 3 [IU] via SUBCUTANEOUS
  Administered 2017-12-14 – 2017-12-16 (×5): 2 [IU] via SUBCUTANEOUS
  Administered 2017-12-16: 3 [IU] via SUBCUTANEOUS
  Administered 2017-12-18 (×2): 2 [IU] via SUBCUTANEOUS
  Administered 2017-12-21: 8 [IU] via SUBCUTANEOUS
  Administered 2017-12-21: 3 [IU] via SUBCUTANEOUS
  Administered 2017-12-21: 5 [IU] via SUBCUTANEOUS
  Administered 2017-12-22: 11 [IU] via SUBCUTANEOUS
  Filled 2017-12-12 (×19): qty 1

## 2017-12-12 MED ORDER — DEXTROSE 50 % IV SOLN
INTRAVENOUS | Status: AC
  Administered 2017-12-12: 50 mL via INTRAVENOUS
  Filled 2017-12-12: qty 50

## 2017-12-12 MED ORDER — VANCOMYCIN HCL IN DEXTROSE 1-5 GM/200ML-% IV SOLN
1000.0000 mg | Freq: Once | INTRAVENOUS | Status: AC
  Administered 2017-12-12: 1000 mg via INTRAVENOUS
  Filled 2017-12-12: qty 200

## 2017-12-12 MED ORDER — HEPARIN (PORCINE) IN NACL 100-0.45 UNIT/ML-% IJ SOLN
1200.0000 [IU]/h | INTRAMUSCULAR | Status: AC
  Administered 2017-12-12: 1200 [IU]/h via INTRAVENOUS
  Filled 2017-12-12: qty 250

## 2017-12-12 MED ORDER — DEXTROSE 50 % IV SOLN
50.0000 mL | Freq: Once | INTRAVENOUS | Status: AC
  Administered 2017-12-12: 50 mL via INTRAVENOUS

## 2017-12-12 NOTE — Progress Notes (Signed)
Patient able to follow some commands during shift and answered a few basic questions (e.g. are you hurting?) but not consistent with either. Better able to answer questions and follow commands in the morning. While able to squeeze hand in the morning, grip was very weak. Phenylephrine titrated to maintain BP goal. Phenylephrine was off but had to be turned back on during shift roughly an hour and thirty minutes after per tube metoprolol given (metoprolol has hold parameters but at time of administration BP was 113/60 (MAP 77) and HR was 100). Patient had to be suctioned several times during shift, cough present but very weak.

## 2017-12-12 NOTE — Progress Notes (Signed)
ANTICOAGULATION CONSULT NOTE  Pharmacy Consult for heparin drip management  Indication: DVT  Patient Measurements: Height: 5\' 9"  (175.3 cm) Weight: 137 lb 12.6 oz (62.5 kg) IBW/kg (Calculated) : 70.7   Vital Signs: Temp: 98.5 F (36.9 C) (08/15 1200) Temp Source: Oral (08/15 1200) BP: 92/47 (08/15 1300) Pulse Rate: 86 (08/15 1300)  Labs: Recent Labs    12/09/17 1856  12/10/17 0239 12/11/17 0431 12/11/17 0816 12/12/17 0424 12/12/17 1224  HGB 12.9*  --  11.9* 9.7*  --  9.3*  --   HCT 39.3*  --  34.8* 28.8*  --  26.7*  --   PLT 247  --  236 153  --  157  --   APTT 63*  --   --   --   --   --   --   LABPROT 13.9  --   --   --   --   --   --   INR 1.08  --   --   --   --   --   --   HEPARINUNFRC  --    < > 0.52 0.40  --  0.31 0.25*  CREATININE 1.20  --  1.15  --   --  1.02  --   TROPONINI  --    < > 1.93*  --  0.79* 0.51*  --    < > = values in this interval not displayed.    Estimated Creatinine Clearance: 55.3 mL/min (by C-G formula based on SCr of 1.02 mg/dL).  Medical History: Past Medical History:  Diagnosis Date  . Anxiety   . BPH (benign prostatic hyperplasia)   . Colon adenomas    history of  . Diverticulosis   . Hyperlipidemia   . Hypertension    Pharmacy consulted for heparin drip management for 77 yo male admitted with AMS and being treated for DVT of right upper extremety. Patient remains sedated and requiring mechanical ventilation.  Goal of Therapy:  Heparin level 0.3-0.7 units/ml Monitor platelets by anticoagulation protocol: Yes    Assessment/Plan:  Heparin level is below goal. Will bolus heparin 1000 units IV and increase infusion to 1200 units/hr. Will not recheck a HL as heparin is to be stopped tonight at midnight in anticipation of trach/PEG tomorrow.   Ulice Dash D, PharmD Clinical Pharmacist 12/12/2017 1:31 PM

## 2017-12-12 NOTE — Progress Notes (Signed)
[place] for heparin drip management  Indication: DVT  Patient Measurements: Height: 5\' 9"  (175.3 cm) Weight: 137 lb 12.6 oz (62.5 kg) IBW/kg (Calculated) : 70.7   Vital Signs: Temp: 98.7 F (37.1 C) (08/15 0315) Temp Source: Oral (08/15 0315) BP: 110/62 (08/15 0500) Pulse Rate: 89 (08/15 0500)  Labs: Recent Labs    12/09/17 1856  12/10/17 0239 12/11/17 0431 12/11/17 0816 12/12/17 0424  HGB 12.9*  --  11.9* 9.7*  --  9.3*  HCT 39.3*  --  34.8* 28.8*  --  26.7*  PLT 247  --  236 153  --  157  APTT 63*  --   --   --   --   --   LABPROT 13.9  --   --   --   --   --   INR 1.08  --   --   --   --   --   HEPARINUNFRC  --   --  0.52 0.40  --  0.31  CREATININE 1.20  --  1.15  --   --  1.02  TROPONINI  --    < > 1.93*  --  0.79* 0.51*   < > = values in this interval not displayed.    Estimated Creatinine Clearance: 55.3 mL/min (by C-G formula based on SCr of 1.02 mg/dL).  Medical History: Past Medical History:  Diagnosis Date  . Anxiety   . BPH (benign prostatic hyperplasia)   . Colon adenomas    history of  . Diverticulosis   . Hyperlipidemia   . Hypertension    Assessment: Pharmacy consulted for heparin drip management for 76 yo male admitted with AMS and being treated for DVT of right upper extremety. Patient remains sedated and requiring mechanical ventilation. 8/8 AM heparin level 0.29. Patient was given 900 unit bolus and rate was increased to 1100 units/hr.   Goal of Therapy:  Heparin level 0.3-0.7 units/ml Monitor platelets by anticoagulation protocol: Yes   08/08 1430 HL therapeutic @ 0.47. Will continue with current heparin rate of 1100 units/hr. Will recheck confirmatory level in 8 hours. CBC with AM labs per protocol.   Plan:  08/15 @ 0430 HL 0.31 therapeutic. Will continue at rate of 1100 units/hr, heparin is going to be stopped at 0700 for surgery. Will need to f/u for next HL.  Tobie Lords, PharmD Clinical  Pharmacist 12/12/2017 5:18 AM

## 2017-12-12 NOTE — Progress Notes (Signed)
[place] for heparin drip management  Indication: DVT  Patient Measurements: Height: 5\' 9"  (175.3 cm) Weight: 137 lb 12.6 oz (62.5 kg) IBW/kg (Calculated) : 70.7   Vital Signs: Temp: 98.7 F (37.1 C) (08/15 0315) Temp Source: Oral (08/15 0315) BP: 102/73 (08/15 0600) Pulse Rate: 87 (08/15 0600)  Labs: Recent Labs    12/09/17 1856  12/10/17 0239 12/11/17 0431 12/11/17 0816 12/12/17 0424  HGB 12.9*  --  11.9* 9.7*  --  9.3*  HCT 39.3*  --  34.8* 28.8*  --  26.7*  PLT 247  --  236 153  --  157  APTT 63*  --   --   --   --   --   LABPROT 13.9  --   --   --   --   --   INR 1.08  --   --   --   --   --   HEPARINUNFRC  --   --  0.52 0.40  --  0.31  CREATININE 1.20  --  1.15  --   --  1.02  TROPONINI  --    < > 1.93*  --  0.79* 0.51*   < > = values in this interval not displayed.    Estimated Creatinine Clearance: 55.3 mL/min (by C-G formula based on SCr of 1.02 mg/dL).  Medical History: Past Medical History:  Diagnosis Date  . Anxiety   . BPH (benign prostatic hyperplasia)   . Colon adenomas    history of  . Diverticulosis   . Hyperlipidemia   . Hypertension    Assessment: Pharmacy consulted for heparin drip management for 77 yo male admitted with AMS and being treated for DVT of right upper extremety. Patient remains sedated and requiring mechanical ventilation. 8/8 AM heparin level 0.29. Patient was given 900 unit bolus and rate was increased to 1100 units/hr.   Goal of Therapy:  Heparin level 0.3-0.7 units/ml Monitor platelets by anticoagulation protocol: Yes   08/08 1430 HL therapeutic @ 0.47. Will continue with current heparin rate of 1100 units/hr. Will recheck confirmatory level in 8 hours. CBC with AM labs per protocol.   Plan:  08/15 @ 0430 HL 0.31 therapeutic. Will continue at rate of 1100 units/hr, patient is not going for surgery at 0700. Heparin drip to continue will check next anti-Xa @ 1230. hgb trending down  will continue to monitor.  Tobie Lords, PharmD Clinical Pharmacist 12/12/2017 7:07 AM

## 2017-12-12 NOTE — Progress Notes (Signed)
EKG completed and placed on pts chart.  

## 2017-12-12 NOTE — Progress Notes (Signed)
[facility] Encounter Note  Patient: Marc [name] / Admit Date: 11/29/2017 / Date of Encounter: 12/12/2017, 8:59 AM   Subjective: Recent somewhat sedated and intubated with no change in condition at this time.  Heart rate controlled with sinus tachycardia but no other EKG changes.  EKG previously showing normal sinus rhythm with inferior lateral T wave inversion unchanged before and after ACLS for mucous plug and hypoxia.  Echocardiogram showing normal LV systolic function with ejection fraction of 65% and no evidence of significant valvular heart disease.  Troponin elevation of 1.9 consistent with non-ST elevation myocardial infarction due to hypoxia and demand ischemia without evidence of acute coronary syndrome  Review of Systems: Cannot assess due to  Objective: Telemetry: Sinus tachycardia Physical Exam: Blood pressure 102/73, pulse 87, temperature 98.7 F (37.1 C), temperature source Oral, resp. rate 15, height 5' 9"  (1.753 m), weight 62.5 kg, SpO2 100 %. Body mass index is 20.35 kg/m. General: Poorly developed, poorly nourished, in no acute distress. Head: Normocephalic, atraumatic, sclera non-icteric, no xanthomas, nares are without discharge. Neck: No apparent masses Lungs: Normal respirations with no wheezes, diffuse rhonchi, no rales , no crackles   Heart: Regular rate and rhythm, normal S1 S2, no murmur, no rub, no gallop, PMI is normal size and placement, carotid upstroke normal without bruit, jugular venous pressure normal Abdomen: Soft, non-tender, non-distended with normoactive bowel sounds. No hepatosplenomegaly. Abdominal aorta is normal size without bruit Extremities: Trace edema, no clubbing, no cyanosis, no ulcers,  Peripheral: 2+ radial, 2+ femoral, 2+ dorsal pedal pulses Neuro: Not alert and oriented. Moves all extremities spontaneously. Psych: Not responds to questions appropriately with a normal affect.   Intake/Output Summary (Last 24 hours)  at 12/12/2017 0859 Last data filed at 12/12/2017 0600 Gross per 24 hour  Intake 1876.88 ml  Output 2220 ml  Net -343.12 ml    Inpatient Medications:  . amLODipine  10 mg Per Tube Daily  . aspirin  81 mg Per Tube Daily  . chlorhexidine gluconate (MEDLINE KIT)  15 mL Mouth Rinse BID  . clarithromycin  500 mg Per Tube Q12H  . docusate  100 mg Per Tube BID  . feeding supplement (PRO-STAT SUGAR FREE 64)  30 mL Per Tube Daily  . free water  200 mL Per Tube Q4H  . hydrALAZINE  50 mg Oral Q8H  . insulin aspart  0-15 Units Subcutaneous Q4H  . insulin glargine  10 Units Subcutaneous QHS  . mouth rinse  15 mL Mouth Rinse 10 times per day  . metoprolol tartrate  50 mg Per Tube BID  . pantoprazole sodium  40 mg Per Tube BID  . [START ON 12/14/2017] pantoprazole sodium  40 mg Per Tube Daily  . potassium chloride  40 mEq Per Tube BID  . sodium chloride flush  10-40 mL Intracatheter Q12H   Infusions:  . sodium chloride Stopped (12/09/17 2200)  . ampicillin-sulbactam (UNASYN) IV Stopped (12/12/17 0525)  . dextrose    . feeding supplement (VITAL AF 1.2 CAL) 1,000 mL (12/12/17 0848)  . heparin    . phenylephrine (NEO-SYNEPHRINE) Adult infusion 5 mcg/min (12/12/17 0845)  . potassium chloride    . propofol (DIPRIVAN) infusion 35.044 mcg/kg/min (12/12/17 0600)    Labs: Recent Labs    12/09/17 2107 12/10/17 0239 12/12/17 0424  NA  --  144 146*  K  --  3.8 2.9*  CL  --  102 99  CO2  --  37* 38*  GLUCOSE  --  110* 78  BUN  --  34* 28*  CREATININE  --  1.15 1.02  CALCIUM  --  7.9* 7.7*  MG 1.7  --  1.7  PHOS  --  3.7 2.8   No results for input(s): AST, ALT, ALKPHOS, BILITOT, PROT, ALBUMIN in the last 72 hours. Recent Labs    12/11/17 0431 12/12/17 0424  WBC 11.3* 10.6  HGB 9.7* 9.3*  HCT 28.8* 26.7*  MCV 97.7 96.8  PLT 153 157   Recent Labs    12/09/17 2107 12/10/17 0239 12/11/17 0816 12/12/17 0424  TROPONINI 0.44* 1.93* 0.79* 0.51*   Invalid input(s): POCBNP No results  for input(s): HGBA1C in the last 72 hours.   Weights: Filed Weights   12/08/17 0500 12/09/17 0600 12/11/17 0738  Weight: 54.8 kg 60.4 kg 62.5 kg     Radiology/Studies:  Dg Chest 1 View  Result Date: 11/30/2017 CLINICAL DATA:  o2 decrease EXAM: CHEST  1 VIEW COMPARISON:  11/30/2017 FINDINGS: Endotracheal tube is in place with tip approximately 3.9 centimeters above the carina. Nasogastric tube is in place, tip coiled back upon itself into the gastric fundal region. There is volume loss at the LEFT lung base, obscuring the LEFT hemidiaphragm. RIGHT lung is clear. No pulmonary edema. IMPRESSION: Persistent significant atelectasis and/or consolidation of the LEFT LOWER lobe. Endotracheal tube and nasogastric tube in good position. No edema. Electronically Signed   By: Nolon Nations M.D.   On: 11/30/2017 18:50   Ct Head Wo Contrast  Result Date: 12/10/2017 CLINICAL DATA:  Altered mental status EXAM: CT HEAD WITHOUT CONTRAST TECHNIQUE: Contiguous axial images were obtained from the base of the skull through the vertex without intravenous contrast. COMPARISON:  Head CT 11/29/2017 FINDINGS: Brain: There is no mass, hemorrhage or extra-axial collection. The size and configuration of the ventricles and extra-axial CSF spaces are normal. 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 There is hypoattenuation of the periventricular white matter, most commonly indicating chronic ischemic microangiopathy. Vascular: No abnormal hyperdensity of the major intracranial arteries or dural venous sinuses. No intracranial atherosclerosis. Skull: The visualized skull base, calvarium and extracranial soft tissues are normal. Sinuses/Orbits: No fluid levels or advanced mucosal thickening of the visualized paranasal sinuses. No mastoid or middle ear effusion. The orbits are normal. IMPRESSION: Chronic small vessel ischemia without acute intracranial abnormality. Electronically Signed   By: Ulyses Jarred M.D.   On:  12/10/2017 14:23   Ct Head Wo Contrast  Result Date: 11/29/2017 CLINICAL DATA:  Unexplained weight loss for several months. Decreased appetite and increasing weakness with unclear speech. Dizziness today. EXAM: CT HEAD WITHOUT CONTRAST TECHNIQUE: Contiguous axial images were obtained from the base of the skull through the vertex without intravenous contrast. COMPARISON:  Head CT and MRI brain 02/16/2015. FINDINGS: Brain: There is no evidence of acute intracranial hemorrhage, mass lesion, brain edema or extra-axial fluid collection. The ventricles and subarachnoid spaces are appropriately sized for age. There is no CT evidence of acute cortical infarction. Stable minimal periventricular white matter disease. Small lacune or perivascular space in the left lentiform nucleus. Vascular: Mild intracranial vascular calcifications. No hyperdense vessel identified. Skull: Negative for fracture or focal lesion. Sinuses/Orbits: The visualized paranasal sinuses and mastoid air cells are clear. No orbital abnormalities are seen. Other: None. IMPRESSION: No acute intracranial findings. No explanation for the patient's symptoms. Electronically Signed   By: Richardean Sale M.D.   On: 11/29/2017 18:27   Ct Chest W Contrast  Result Date: 11/30/2017 CLINICAL DATA:  Altered mental status. Left pleural effusion on x-Seven EXAM: CT CHEST WITH CONTRAST TECHNIQUE: Multidetector CT imaging of the chest was performed during intravenous contrast administration. CONTRAST:  67m OMNIPAQUE IOHEXOL 300 MG/ML  SOLN COMPARISON:  X-Deldrick earlier today FINDINGS: Cardiovascular: The heart is upper normal to mildly enlarged. No pericardial effusion. Coronary artery calcification is evident. Atherosclerotic calcification is noted in the wall of the thoracic aorta. Mediastinum/Nodes: No mediastinal lymphadenopathy. There is no hilar lymphadenopathy. The esophagus has normal imaging features. There is no axillary lymphadenopathy. Lungs/Pleura: There is  minimal dependent mucus in the trachea. Right lower lobe collapse/consolidation evident. Collapse/consolidation also noted left lower lobe. No pleural effusion. Upper Abdomen: There may be a trace amount of fluid along the dome of the spleen. Trace sub pulmonic effusion would also be a consideration. Musculoskeletal: Expansile lytic foci are identified in the T11 vertebral body but appear to be isolated to this level. These are present on a study from 12/06/2010 and are unchanged. Tiny lucent foci are noted in the manubrium and sternum, indeterminate. IMPRESSION: 1. Bilateral lower lobe collapse/consolidation. 2. Possible tiny left pleural effusion versus a trace amount of fluid along the dome of the spleen. 3.  Aortic Atherosclerois (ICD10-170.0) Electronically Signed   By: EMisty StanleyM.D.   On: 11/30/2017 02:48   Mr Brain Wo Contrast  Result Date: 12/01/2017 CLINICAL DATA:  Poor appetite. Dehydration. Weakness. Weight loss. EXAM: MRI HEAD WITHOUT CONTRAST TECHNIQUE: Multiplanar, multiecho pulse sequences of the brain and surrounding structures were obtained without intravenous contrast. COMPARISON:  CT 11/29/2017 FINDINGS: Brain: Mild age related volume loss. Mild small vessel change of the pons and cerebral hemispheric white matter. No evidence of acute or subacute infarction. No mass lesion, hemorrhage, hydrocephalus or extra-axial collection. Vascular: Major vessels at the base of the brain show flow. Skull and upper cervical spine: Negative Sinuses/Orbits: Clear/normal Other: None IMPRESSION: No acute or reversible finding. No cause of the presenting symptoms is identified. Mild age related volume loss and chronic small-vessel change of the pons and hemispheric white matter. Electronically Signed   By: MNelson ChimesM.D.   On: 12/01/2017 14:10   Mr Thoracic Spine W Wo Contrast  Result Date: 12/10/2017 CLINICAL DATA:  Abnormal T5 lesion on prior MRI. EXAM: MRI THORACIC AND LUMBAR SPINE WITHOUT AND WITH  CONTRAST TECHNIQUE: Multiplanar and multiecho pulse sequences of the thoracic and lumbar spine were obtained without and with intravenous contrast. CONTRAST:  141mMULTIHANCE GADOBENATE DIMEGLUMINE 529 MG/ML IV SOLN COMPARISON:  08/22/2017 thoracic and lumbar spine MRI FINDINGS: MRI THORACIC SPINE FINDINGS Alignment:  Physiologic. Vertebrae: The low T1/T2-weighted signal lesion at T5 is unchanged in size. No associated contrast enhancement. Smaller lesion with similar signal characteristics at T6 is also unchanged. 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 No abnormal marrow contrast enhancement. Cord:  Normal Paraspinal and other soft tissues: Medium-sized pleural effusions with associated atelectasis. Disc levels: No disc herniation or stenosis. MRI LUMBAR SPINE FINDINGS Segmentation:  Standard. Alignment:  Physiologic. Vertebrae:  No fracture, evidence of discitis, or bone lesion. Conus medullaris: Extends to the L1 level and appears normal. Paraspinal and other soft tissues: Negative Disc levels: L1-2: Normal. L2-3: Mild disc bulge without stenosis. L3-L4: Intermediate disc bulge. No spinal canal or neural foraminal stenosis. Normal facets. L4-L5: Intermediate disc bulge and endplate spurring with mild bilateral foraminal stenosis, right worse than left, unchanged. L5-S1: Normal. The visualized sacrum is normal. IMPRESSION: 1. Unchanged appearance of low T1/T2-weighted signal, nonenhancing lesion of T5. While characteristics remain nonspecific, lack  of interval change is reassuring. 2. Mild lower lumbar degenerative disc disease, unchanged. 3. Medium-sized pleural effusions. Electronically Signed   By: Ulyses Jarred M.D.   On: 12/10/2017 14:14   Mr Lumbar Spine W Wo Contrast  Result Date: 12/10/2017 CLINICAL DATA:  Abnormal T5 lesion on prior MRI. EXAM: MRI THORACIC AND LUMBAR SPINE WITHOUT AND WITH CONTRAST TECHNIQUE: Multiplanar and multiecho pulse sequences of the thoracic and lumbar spine were obtained without  and with intravenous contrast. CONTRAST:  60m MULTIHANCE GADOBENATE DIMEGLUMINE 529 MG/ML IV SOLN COMPARISON:  08/22/2017 thoracic and lumbar spine MRI FINDINGS: MRI THORACIC SPINE FINDINGS Alignment:  Physiologic. Vertebrae: The low T1/T2-weighted signal lesion at T5 is unchanged in size. No associated contrast enhancement. Smaller lesion with similar signal characteristics at T6 is also unchanged. 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 No abnormal marrow contrast enhancement. Cord:  Normal Paraspinal and other soft tissues: Medium-sized pleural effusions with associated atelectasis. Disc levels: No disc herniation or stenosis. MRI LUMBAR SPINE FINDINGS Segmentation:  Standard. Alignment:  Physiologic. Vertebrae:  No fracture, evidence of discitis, or bone lesion. Conus medullaris: Extends to the L1 level and appears normal. Paraspinal and other soft tissues: Negative Disc levels: L1-2: Normal. L2-3: Mild disc bulge without stenosis. L3-L4: Intermediate disc bulge. No spinal canal or neural foraminal stenosis. Normal facets. L4-L5: Intermediate disc bulge and endplate spurring with mild bilateral foraminal stenosis, right worse than left, unchanged. L5-S1: Normal. The visualized sacrum is normal. IMPRESSION: 1. Unchanged appearance of low T1/T2-weighted signal, nonenhancing lesion of T5. While characteristics remain nonspecific, lack of interval change is reassuring. 2. Mild lower lumbar degenerative disc disease, unchanged. 3. Medium-sized pleural effusions. Electronically Signed   By: KUlyses JarredM.D.   On: 12/10/2017 14:14   [place]Venous Img Upper Uni Right  Result Date: 12/03/2017 CLINICAL DATA:  Right upper extremity pain and edema. Evaluate for DVT. EXAM: RIGHT UPPER EXTREMITY VENOUS DOPPLER ULTRASOUND TECHNIQUE: Gray-scale sonography with graded compression, as well as color Doppler and duplex ultrasound were performed to evaluate the upper extremity deep venous system from the level of the subclavian vein and  including the jugular, axillary, basilic, radial, ulnar and upper cephalic vein. Spectral Doppler was utilized to evaluate flow at rest and with distal augmentation maneuvers. COMPARISON:  Chest radiograph-12/02/2018 FINDINGS: Contralateral Subclavian Vein: Respiratory phasicity is normal and symmetric with the symptomatic side. No evidence of thrombus. Normal compressibility. Internal Jugular Vein: No evidence of thrombus. Normal compressibility, respiratory phasicity and response to augmentation. Subclavian Vein: There is nonocclusive DVT surrounding the right upper extremity approach PICC line at the level of the peripheral aspect of the right subclavian vein (images 10 and 11). Axillary Vein: No evidence of thrombus. Normal compressibility, respiratory phasicity and response to augmentation. Cephalic Vein: There is mixed echogenic occlusive thrombus involving the distal aspect of the cephalic vein (image 18 through 21). The proximal aspect of the cephalic vein appears widely patent (image 16). Basilic Vein: No evidence of thrombus. Normal compressibility, respiratory phasicity and response to augmentation. Brachial Veins: No evidence of thrombus. Normal compressibility, respiratory phasicity and response to augmentation. Radial Veins: No evidence of thrombus though rouleaux flow was demonstrated. Normal compressibility, respiratory phasicity and response to augmentation. Ulnar Veins: No evidence of thrombus. Normal compressibility, respiratory phasicity and response to augmentation. Venous Reflux:  None visualized. Other Findings:  None visualized. IMPRESSION: 1. The examination is positive for nonocclusive DVT surrounding the right upper extremity approach PICC line at level peripheral aspect of the right subclavian vein. 2. Examination  is positive for occlusive SVT involving the cephalic vein at the level the mid and distal humerus. Electronically Signed   By: Sandi Mariscal M.D.   On: 12/03/2017 15:03   Dg  Chest Port 1 View  Result Date: 12/12/2017 CLINICAL DATA:  Respiratory failure EXAM: PORTABLE CHEST 1 VIEW COMPARISON:  December 09, 2017 FINDINGS: The right PICC line terminates near the caval atrial junction. The ETT is in good position. The NG tube terminates below today's film. No pneumothorax. Stable cardiomediastinal silhouette. Stable opacity in the left base. Mild opacity in the right base is also similar in the interval. No other changes. IMPRESSION: 1. Support apparatus as above. 2. Bibasilar opacities are similar in the interval. Electronically Signed   By: Dorise Bullion III M.D   On: 12/12/2017 07:36   Dg Chest Port 1 View  Result Date: 12/09/2017 CLINICAL DATA:  Encounter for intubation EXAM: PORTABLE CHEST 1 VIEW COMPARISON:  12/09/2017 FINDINGS: Endotracheal tube is 2 cm above the carina. Right PICC line tip is at the cavoatrial junction. NG tube enters the stomach. There is mild vascular congestion and bibasilar atelectasis. Suspect small effusions bilaterally. IMPRESSION: Mild vascular congestion and bibasilar atelectasis along with small bilateral effusions. Vascular congestion worsened since prior study. Electronically Signed   By: Rolm Baptise M.D.   On: 12/09/2017 19:18   Dg Chest Port 1 View  Result Date: 12/09/2017 CLINICAL DATA:  Atelectasis EXAM: PORTABLE CHEST 1 VIEW COMPARISON:  December 08, 2017 FINDINGS: Central catheter tip is in the right atrium. Nasogastric tube tip and side port are below the diaphragm. No pneumothorax. There are small pleural effusions bilaterally with mild bibasilar atelectatic change. There is no frank edema or consolidation. Heart is upper normal in size with pulmonary vascularity normal. No adenopathy. No bone lesions. IMPRESSION: Tube and catheter positions as described without pneumothorax. Small pleural effusions bilaterally with mild bibasilar atelectasis. No consolidation. Stable cardiac silhouette. Electronically Signed   By: Lowella Grip III  M.D.   On: 12/09/2017 07:15   Dg Chest Port 1 View  Result Date: 12/08/2017 CLINICAL DATA:  Atelectasis EXAM: PORTABLE CHEST 1 VIEW COMPARISON:  Two days ago FINDINGS: An orogastric tube and side-port reaches the stomach which appears decompressed. Right upper extremity PICC in good position. Artifact from EKG leads. Low volume chest with hazy opacities at both bases. On chest CT 11/30/2017 there is atelectasis at the bases. No Kerley lines or pneumothorax. Normal heart size for technique. IMPRESSION: Stable atelectasis with possible small volume pleural fluid at the bases. Electronically Signed   By: Monte Fantasia M.D.   On: 12/08/2017 07:13   Dg Chest Port 1 View  Result Date: 12/06/2017 CLINICAL DATA:  Dyspnea. EXAM: PORTABLE CHEST 1 VIEW COMPARISON:  12/05/2017 and older exams. FINDINGS: Since the prior study, the endotracheal tube and nasogastric tube have been removed. Right PICC is stable and well positioned. There is opacity at both lung bases similar on the right to the prior exam, increased on the left, consistent with small effusions and associated atelectasis. Remainder of the lungs is clear. No pneumothorax. Cardiac silhouette is normal in size. IMPRESSION: 1. Mild worsening in lung aeration at the left lung base with persistent right lung base opacity. Basilar lung opacities are most likely combination of small effusions and atelectasis. 2. Status post extubation and removal of the nasogastric tube since the prior exam. Electronically Signed   By: Lajean Manes M.D.   On: 12/06/2017 20:04   Dg Chest Port 1  View  Result Date: 12/05/2017 CLINICAL DATA:  Acute onset of respiratory failure. EXAM: PORTABLE CHEST 1 VIEW COMPARISON:  Chest radiograph performed 12/01/2017 FINDINGS: The patient's endotracheal tube is seen ending 6 cm above the carina. An enteric tube is noted extending below the diaphragm. A right PICC is noted ending about the distal SVC. Bibasilar airspace opacities, right greater  than left, may reflect pneumonia. A small left pleural effusion is noted. No pneumothorax is seen. An apparent calcified granuloma is noted at the right midlung zone. The cardiomediastinal silhouette is normal in size. No acute osseous abnormalities are identified. IMPRESSION: 1. Endotracheal tube seen ending 6 cm above the carina. 2. Bibasilar airspace opacities, right greater than left, may reflect pneumonia. Small left pleural effusion noted. Electronically Signed   By: Garald Balding M.D.   On: 12/05/2017 00:43   Dg Chest Port 1 View  Result Date: 12/01/2017 CLINICAL DATA:  Check endotracheal tube EXAM: PORTABLE CHEST 1 VIEW COMPARISON:  11/30/2017 FINDINGS: Right-sided PICC line, endotracheal tube and nasogastric catheter are again seen and stable. Persistent changes in the left retrocardiac region are noted although some mild improved aeration is seen. No bony abnormality is noted. IMPRESSION: Improving left basilar infiltrate. Tubes and lines as described. Electronically Signed   By: Inez Catalina M.D.   On: 12/01/2017 11:47   Dg Chest Portable 1 View  Result Date: 11/30/2017 CLINICAL DATA:  Endotracheal tube placement and OG tube placement. Unexplained weight loss. Increased weakness. Became unresponsive this morning. EXAM: PORTABLE CHEST 1 VIEW COMPARISON:  Chest x-Ab dated 11/30/2017. FINDINGS: OG tube is adequately positioned in the stomach with tip directed towards the stomach fundus. Endotracheal tube is adequately positioned with tip approximately 5 cm above the carina. Dense consolidation at the LEFT lung base, as seen on today's earlier chest CT, atelectasis versus pneumonia. Mild atelectasis at the RIGHT lung base, as also seen on today's earlier chest CT. No new lung findings in the short-term interval. No pneumothorax seen. IMPRESSION: 1. Endotracheal tube is adequately positioned with tip approximately 5 cm above the carina. 2. OG tube is adequately positioned in the stomach with tip  directed towards the stomach fundus. 3. Bibasilar consolidations, better demonstrated on today's earlier chest CT, compatible with atelectasis, pneumonia or aspiration. Electronically Signed   By: Franki Cabot M.D.   On: 11/30/2017 09:53   Dg Chest Port 1 View  Result Date: 11/30/2017 CLINICAL DATA:  Weakness. EXAM: PORTABLE CHEST 1 VIEW COMPARISON:  None. FINDINGS: Lung volumes are low. Patchy left basilar opacity with left pleural effusion. Normal heart size and mediastinal contours allowing for technique and rotation. Mild right infrahilar atelectasis. No pulmonary edema. No pneumothorax. IMPRESSION: Patchy left basilar opacity and left pleural effusion. Considerations include effusion causing adjacent atelectasis or pneumonia with parapneumonic effusion. Recommend radiographic follow-up. Electronically Signed   By: Jeb Levering M.D.   On: 11/30/2017 00:18   Dg Abd Portable 1v  Result Date: 12/07/2017 CLINICAL DATA:  Nasogastric tube placement. EXAM: PORTABLE ABDOMEN - 1 VIEW COMPARISON:  Abdominal radiograph performed earlier today at 12:01 p.m. FINDINGS: The patient's enteric tube is noted ending overlying the body of the stomach. The visualized bowel gas pattern is grossly unremarkable. Clips are noted within the right upper quadrant, reflecting prior cholecystectomy. A small left pleural effusion is noted. No acute osseous abnormalities are identified. IMPRESSION: Enteric tube noted ending overlying the body of the stomach. Electronically Signed   By: Garald Balding M.D.   On: 12/07/2017 00:14  Dg Abd Portable 1v  Result Date: 12/06/2017 CLINICAL DATA:  New Dobbhoff tube placement EXAM: PORTABLE ABDOMEN - 1 VIEW COMPARISON:  None. FINDINGS: The Dobbhoff tube terminates in the distal stomach. Recommend advancement. Small bilateral pleural effusions and underlying atelectasis. IMPRESSION: The Dobbhoff tube terminates in the distal stomach. Recommend repositioning. Electronically Signed   By:  Dorise Bullion III M.D   On: 12/06/2017 12:43   [place] Ekg Site Rite  Result Date: 11/30/2017 If Site Rite image not attached, placement could not be confirmed due to current cardiac rhythm.  Dg Fluoro Guided Loc Of Needle/cath Tip For Spinal Inject Lt  Result Date: 12/02/2017 CLINICAL DATA:  Suspected meningitis.  Encephalopathy. EXAM: DIAGNOSTIC LUMBAR PUNCTURE UNDER FLUOROSCOPIC GUIDANCE FLUOROSCOPY TIME:  Fluoroscopy Time:  18 seconds Radiation Exposure Index (if provided by the fluoroscopic device): 1.1 mGy Number of Acquired Spot Images: 0 PROCEDURE: Informed consent was obtained from the patient prior to the procedure, including potential complications of headache, allergy, and pain. With the patient prone, the lower back was prepped with Betadine. 1% Lidocaine was used for local anesthesia. Lumbar puncture was performed at the L3-4 level using a 22 gauge needle with return of clear CSF. 9 ml of CSF were obtained for laboratory studies. The patient tolerated the procedure well and there were no apparent complications. IMPRESSION: Successful fluoroscopic guided lumbar puncture. Electronically Signed   By: Kathreen Devoid   On: 12/02/2017 10:07     Assessment and Recommendation  77 y.o. male with acute mental status changes with poor nutrition and probable difficulty with swallowing causing aspiration pneumonitis and mucous plugging with pulseless electrical activity hypoxia status post ACLS protocol likely causing demand ischemia and non-ST elevation myocardial infarction with unchanged EKG or hemodynamic status with normal LV function by echocardiogram and no current evidence of heart failure or need for further diagnostic testing.  Patient is at lowest risk possible for further continuation of treatment for above issues from the cardiovascular standpoint 1.  Continue supportive care for respiratory swallowing and poor nutrition without restriction 2.  No further cardiac diagnostics necessary at this  time 3.  Hypertension controlled with metoprolol amlodipine and hydralazine as necessary for goal systolic blood pressure below 130 mm 4.  Antiplatelet medication management including aspirin 5.  Heparin and anticoagulation for deep venous thrombosis treatment 6.  Call if further questions  Signed, Serafina Royals M.D. FACC

## 2017-12-12 NOTE — Care Management (Addendum)
Tracheotomy pending. Message sent to patient's son to follow up on LTAC offers and to see if he has any questions.  I have offered him opportunity to meet with either or both reps. Son responded that "if patient goes anywhere he would want him to go to the one in [place] but to talk with patient's wife". RNCM will follow up with wife this AM.  Patient insurance will require Peer to Peer which will need to be initiated by [facility].

## 2017-12-12 NOTE — Care Management (Signed)
RNCM met with patient's wife in ICU and she would like to start LTAC authorization at [employer]. Per Progression Rounding today, tracheotomy is planned for today; g-tube pending.  I have notified [employer] team Manuela Schwartz [phone] (office authorization [phone]) of LTAC authorization request per patient's wife and Dr. Merton Border.  RNCM will follow.

## 2017-12-12 NOTE — Progress Notes (Signed)
[place] [facility] [place] NAME: Jovaun Levene    MR#:  [id number]  DATE OF BIRTH:  Jul 02, 1940  SUBJECTIVE:  CHIEF COMPLAINT:   Chief Complaint  Patient presents with  . Weakness  . Dizziness   -Intubated and sedated.  . -No significant improvement noted - for trach and peg tomorrow  REVIEW OF SYSTEMS:  Review of Systems  Unable to perform ROS: Critical illness    DRUG ALLERGIES:   Allergies  Allergen Reactions  . Glimepiride   . Tape Itching and Rash    VITALS:  Blood pressure (!) 107/54, pulse 95, temperature 98.5 F (36.9 C), temperature source Oral, resp. rate 17, height 5\' 9"  (1.753 m), weight 62.5 kg, SpO2 96 %.  PHYSICAL EXAMINATION:  Physical Exam   GENERAL:  77 y.o.-year-old patient lying in the bed, ill-appearing,  EYES: Pupils equal, round, reactive to light and accommodation. No scleral icterus. Extraocular muscles intact.  HEENT: Head atraumatic, normocephalic. Oropharynx and nasopharynx clear.  Orally intubated NECK:  Supple, no jugular venous distention. No thyroid enlargement, no tenderness.  LUNGS: Normal breath sounds bilaterally, no wheezing, rales  or crepitation. No use of accessory muscles of respiration.  Bibasilar rhonchi heard. CARDIOVASCULAR: S1, S2 normal. No  rubs, or gallops.  2/6 systolic murmur is present ABDOMEN: Soft, nontender, nondistended. Bowel sounds present. No organomegaly or mass.  EXTREMITIES: No pedal edema, cyanosis, or clubbing.  NEUROLOGIC: sedated, able to move all extremities in bed when sedation is decreased PSYCHIATRIC: The patient is sedated SKIN: No obvious rash, lesion, or ulcer.    LABORATORY PANEL:   CBC Recent Labs  Lab 12/12/17 0424  WBC 10.6  HGB 9.3*  HCT 26.7*  PLT 157   ------------------------------------------------------------------------------------------------------------------  Chemistries  Recent Labs  Lab 12/06/17 2035  12/12/17 0424  NA 148*    < > 146*  K 4.0   < > 2.9*  CL 106   < > 99  CO2 33*   < > 38*  GLUCOSE 253*   < > 78  BUN 46*   < > 28*  CREATININE 1.56*   < > 1.02  CALCIUM 8.9   < > 7.7*  MG 1.9   < > 1.7  AST 40  --   --   ALT 31  --   --   ALKPHOS 68  --   --   BILITOT 0.7  --   --    < > = values in this interval not displayed.   ------------------------------------------------------------------------------------------------------------------  Cardiac Enzymes Recent Labs  Lab 12/12/17 0424  TROPONINI 0.51*   ------------------------------------------------------------------------------------------------------------------  RADIOLOGY:  Dg Chest Port 1 View  Result Date: 12/12/2017 CLINICAL DATA:  Respiratory failure EXAM: PORTABLE CHEST 1 VIEW COMPARISON:  December 09, 2017 FINDINGS: The right PICC line terminates near the caval atrial junction. The ETT is in good position. The NG tube terminates below today's film. No pneumothorax. Stable cardiomediastinal silhouette. Stable opacity in the left base. Mild opacity in the right base is also similar in the interval. No other changes. IMPRESSION: 1. Support apparatus as above. 2. Bibasilar opacities are similar in the interval. Electronically Signed   By: Dorise Bullion III M.D   On: 12/12/2017 07:36    EKG:   Orders placed or performed during the hospital encounter of 11/29/17  . ED EKG  . ED EKG  . EKG 12-Lead  . EKG 12-Lead  . EKG 12-Lead  . EKG 12-Lead  .  EKG 12-Lead  . EKG 12-Lead  . EKG 12-Lead  . EKG 12-Lead  . EKG 12-Lead  . EKG 12-Lead  . EKG 12-Lead  . EKG 12-Lead  . EKG 12-Lead  . EKG 12-Lead  . EKG 12-Lead  . EKG 12-Lead    ASSESSMENT AND PLAN:   77 year old male with past medical history significant for hypertension, hyperlipidemia and recent diagnosis of H. pylori gastritis on 11/27/2017 presents to hospital from home secondary to weakness, confusion.  1.  Acute respiratory failure- Initially intubated for possible aspiration,  extubated however reintubated on 12/09/2017. -Chest x-Yesenia with pulmonary vascular congestion as well. -Vent management per intensivist. For Trach and peg tomorrow -Echocardiogram if not done recently  -also concern for aspiration pneumonia.  on Unasyn  2.  Acute encephalopathy-appreciate neurology consult.  Initially considered to be secondary to PRES syndrome -MRI of the brain showing mild volume loss and chronic ischemic changes, no acute findings. -Lumbar puncture was negative -Patient still encephalopathic.  MRI of the lumbar spine and thoracic spine ordered for lower extremity weakness and repeat CT of the head -Also myasthenia gravis labs, labs for para-neoplastic syndrome ordered - however currently sedated- if no improvement recommend repeat MRI brain  3.  H. pylori gastritis-diagnosed with biopsy from recent endoscopy about 2 weeks ago. -Continue treatment with Protonix, amoxicillin and clarithromycin  4.  Hypertension-labile blood pressures. -Continue metoprolol and Norvasc for now and monitor  5.  Right upper extremity DVT-has a PICC line currently in that arm. -Nonocclusive DVT in the right subclavian and an occlusive DVT in the right cephalic vein seen. -on IV heparin drip for now   Critically ill, guarded prognosis   All the records are reviewed and case discussed with Care Management/Social Workerr. Management plans discussed with the patient, family and they are in agreement.  CODE STATUS: Full Code  TOTAL TIME TAKING CARE OF THIS PATIENT: 32 minutes.   POSSIBLE D/C IN ? DAYS, DEPENDING ON CLINICAL CONDITION.   Gladstone Lighter M.D on 12/12/2017 at 3:37 PM  Between 7am to 6pm - Pager - [phone]  After 6pm go to [email or web address] - password [facility]  Office  [phone]  CC: Primary care physician; Tracie Harrier, MD

## 2017-12-12 NOTE — Progress Notes (Signed)
Pharmacy Antibiotic Note  Marc [name] is a 77 y.o. male admitted on 11/29/2017 with AMS. Patient was re-intubated subsequent to cardiac arrest 8/13.  Pharmacy has been consulted for Unasyn dosing for aspiration pnemonia. Patient has been on H pylori regimen consisting of clarithromycin, amoxicillin, and pantoprazole that ends 8/16.  8/15 TA growing MRSA   Ke= 0.050 h-1 t1/2= 14 h Vd=  43.8 L  Plan: After discussion with Dr. Alva Garnet, will add vancomycin with a planned 7 days of therapy and continue Unasyn for a total of 7 days.   Will continue Unasyn 3 g iv q 6 hours.  Will initiate vancomycin for MRSA with a 1000 mg initial dose followed by 750 mg iv q 12 hours. Will plan on checking a level with the 4th dose for a goal trough of 15-20 mcg/ml.   Height: 5\' 9"  (175.3 cm) Weight: 137 lb 12.6 oz (62.5 kg) IBW/kg (Calculated) : 70.7  Temp (24hrs), Avg:99.4 F (37.4 C), Min:98.1 F (36.7 C), Max:100.8 F (38.2 C)  Recent Labs  Lab 12/06/17 2035  12/08/17 2245 12/09/17 0527 12/09/17 1856 12/10/17 0239 12/11/17 0431 12/12/17 0424  WBC  --    < >  --  5.3 12.2* 14.3* 11.3* 10.6  CREATININE 1.56*   < > 1.01 1.23 1.20 1.15  --  1.02  LATICACIDVEN 1.8  --   --   --   --   --   --   --    < > = values in this interval not displayed.    Estimated Creatinine Clearance: 55.3 mL/min (by C-G formula based on SCr of 1.02 mg/dL).    Allergies  Allergen Reactions  . Glimepiride   . Tape Itching and Rash    Antimicrobials this admission: Zosyn 8/2 >> 8/3 vancomycin 8/2 >> 8/6 Acyclovir 8/3 >> 8/8 Ampicillin/amoxicillin 8/3 >> 8/11 clarithormycin 8/6 >> 8/16 Ceftriaxone 8/3 >> 8/5 Unasyn 8/14 >> Vancomycin 8/15 >>  Dose adjustments this admission:   Microbiology results: 8/5 BCx: NG 8/2 BCx: NG 8/2 UCx: insignificant growth  8/5 CSF: NG 8/3 MRSA PCR: negative 8/13 UCx: NG 8/13 BCx: NGTD 8/13 TA: MRSA   Thank you for allowing pharmacy to be a part of this patient's  care.  Ulice Dash D 12/12/2017 11:30 AM

## 2017-12-12 NOTE — Anesthesia Preprocedure Evaluation (Addendum)
Anesthesia Evaluation  Patient identified by MRN, date of birth, ID band Patient unresponsive    Reviewed: Allergy & Precautions, H&P , NPO status , Patient's Chart, lab work & pertinent test results  History of Anesthesia Complications Negative for: history of anesthetic complications  Airway Mallampati: Intubated  TM Distance: >3 FB     Dental  (+) Chipped   Pulmonary shortness of breath,           Cardiovascular Exercise Tolerance: Poor hypertension, + Past MI       Neuro/Psych PSYCHIATRIC DISORDERS Anxiety negative neurological ROS  negative psych ROS   GI/Hepatic negative GI ROS, Neg liver ROS,   Endo/Other  negative endocrine ROS  Renal/GU      Musculoskeletal   Abdominal   Peds  Hematology negative hematology ROS (+)   Anesthesia Other Findings   Past Medical History: No date: Anxiety No date: BPH (benign prostatic hyperplasia) No date: Colon adenomas     Comment:  history of No date: Diverticulosis No date: Hyperlipidemia No date: Hypertension  Past Surgical History: No date: CHOLECYSTECTOMY 01/23/2016: COLONOSCOPY WITH PROPOFOL; N/A     Comment:  Procedure: COLONOSCOPY WITH PROPOFOL;  Surgeon: Manya Silvas, MD;  Location: [facility] ENDOSCOPY;  Service:               Endoscopy;  Laterality: N/A; 11/27/2017: ESOPHAGOGASTRODUODENOSCOPY (EGD) WITH PROPOFOL; N/A     Comment:  Procedure: ESOPHAGOGASTRODUODENOSCOPY (EGD) WITH               PROPOFOL;  Surgeon: Manya Silvas, MD;  Location:               [facility] ENDOSCOPY;  Service: Endoscopy;  Laterality: N/A;  BMI    Body Mass Index:  20.35 kg/m      Reproductive/Obstetrics negative OB ROS                           Anesthesia Physical Anesthesia Plan  ASA: IV  Anesthesia Plan: General ETT   Post-op Pain Management:    Induction: Intravenous and Inhalational  PONV Risk Score and Plan:   Airway  Management Planned: Oral ETT  Additional Equipment:   Intra-op Plan:   Post-operative Plan: Possible Post-op intubation/ventilation  Informed Consent: I have reviewed the patients History and Physical, chart, labs and discussed the procedure including the risks, benefits and alternatives for the proposed anesthesia with the patient or authorized representative who has indicated his/her understanding and acceptance.   Dental Advisory Given  Plan Discussed with: Anesthesiologist, CRNA and Surgeon  Anesthesia Plan Comments: (We would like serum K greater than 3.0 for elective procedure   Wife consented for risks of anesthesia including but not limited to:  - adverse reactions to medications - damage to teeth, lips or other oral mucosa - sore throat or hoarseness - Damage to heart, brain, lungs or loss of life  Wife voiced understanding.)       Anesthesia Quick Evaluation

## 2017-12-12 NOTE — Progress Notes (Signed)
CRITICAL CARE NOTE  CC  follow up respiratory failure with generalized weakness  SUBJECTIVE Patient remains critically ill Prognosis is guarded  77 year old male with acute encephalopathy, sepsis, pneumonia, and dysphagia/dysphonia who was admitted on 8/2. Patient remains intubated and sedated. Consults placed for tracheostomy and peg tube. Tracheostomy scheduled for tomorrow morning.    SIGNIFICANT EVENTS  Patient with diarrhea yesterday, c. Diff is negative.   BP 102/73   Pulse 87   Temp 98.7 F (37.1 C) (Oral)   Resp 15   Ht 5\' 9"  (1.753 m)   Wt 62.5 kg   SpO2 100%   BMI 20.35 kg/m    REVIEW OF SYSTEMS  PATIENT IS UNABLE TO PROVIDE COMPLETE REVIEW OF SYSTEM S DUE TO SEVERE CRITICAL ILLNESS AND ENCEPHALOPATHY   PHYSICAL EXAMINATION:  GENERAL:critically ill appearing, +resp distress HEAD: Normocephalic, atraumatic.  EYES: Pupils equal, round, reactive to light.  No scleral icterus.  MOUTH: Moist mucosal membrane. NECK: Supple. No thyromegaly. No nodules. No JVD.  PULMONARY: Rhonchi bilaterally, no wheezes/rales.  CARDIOVASCULAR: S1 and S2. Regular rate and rhythm. No murmurs, rubs, or gallops. 2+ dorsalis pedis pulses bilaterally GASTROINTESTINAL: Soft, nontender, -distended. No masses. Positive bowel sounds. No hepatosplenomegaly.  MUSCULOSKELETAL: No swelling, clubbing, or edema.  NEUROLOGIC: obtunded, GCS<8 SKIN:intact,warm,dry  ASSESSMENT AND PLAN SYNOPSIS   77 year old male admitted on 8/2 for generalized weakness and acute encephalopathy. Patient remains intubated and sedated. Currently being treated for aspiration pneumonia with unasyn. Will proceed with tracheostomy and peg tube placement.    Severe Hypoxic Respiratory Failure secondary to aspiration pneumonia -continue Full MV support -continue DuoNeb PRN -Wean Fio2 and PEEP as tolerated -aspiration pneumonia: continue unasyn -Will proceed with tracheostomy tomorrow morning -CXR: stable bibasilar  opacities.    Renal: -hypernatremia 146: start D5W -Hypokalemia: 2.9--> replete -Elevated Bicarbonate: 38  -ABG shows metabolic alkalosis with respiratory compensation   NEUROLOGY - intubated and sedated: propofol - minimal sedation to achieve a RASS goal: -1 -CT head 8/13 without acute abnormality -MRI of lumbar and thoracic spine: lesions unchanged from previous studies  -Consider MRI of brain without contrast per Dr. Omer Jack note -Paraneoplastic panel pending -Myasthenia gravis panel negative    GASTROINTESTINAL: -continue treatment for h. Pylori until 8/16  -clarithromycin, amoxil, protonix  CARDIAC -wean neo-synephrine as tolerated, MAP goal >65 -continue heparin drip for DVT and SVT of right upper arm -appreciate cardiology consult for elevated troponin and inferolateral t wave inversions  -no further intervention needed at this time  -troponin trending down: 1.93-->0.79-->0.51   ID -continue IV abx as prescribed: unasyn  -add vancomycin for MRSA coverage -leukocytosis resolved: 12.2-->14.3-->11.3-->10.6 -follow up cultures  -respiratory culture 8/13: moderate staph aureus  -blood culture 8/13: no growth x 2 days -C Diff PCR: NEGATIVE   DVT: heparin GI PRX: protonix TRANSFUSIONS AS NEEDED MONITOR FSBS ASSESS the need for LABS as needed   Critical Care Time devoted to patient care services described in this note is 26 minutes.   Overall, patient is critically ill, prognosis is guarded.  Patient with Multiorgan failure and at high risk for cardiac arrest and death.

## 2017-12-12 NOTE — Progress Notes (Signed)
GI Inpatient Follow-up Note  HPI: Marc [name] is a 77 y.o. male seen in consultation for PEG tube placement. Pt is currently ventilator dependent. Pt was seen and examined this afternoon. Per nursing, pt has recently tested positive for MRSA and put on contact precautions. No acute events overnight. Wife is not present at bedside during time of examination. Per nursing, she is gone for the day and will be back tomorrow morning. ENT has seen the patient and plan is for tracheostomy tomorrow morning.     Scheduled Inpatient Medications:  . amLODipine  10 mg Per Tube Daily  . chlorhexidine gluconate (MEDLINE KIT)  15 mL Mouth Rinse BID  . docusate  100 mg Per Tube BID  . feeding supplement (PRO-STAT SUGAR FREE 64)  30 mL Per Tube Daily  . free water  200 mL Per Tube Q4H  . hydrALAZINE  50 mg Oral Q8H  . insulin aspart  0-15 Units Subcutaneous Q4H  . insulin glargine  10 Units Subcutaneous QHS  . mouth rinse  15 mL Mouth Rinse 10 times per day  . metoprolol tartrate  50 mg Per Tube BID  . pantoprazole sodium  40 mg Per Tube BID  . [START ON 12/14/2017] pantoprazole sodium  40 mg Per Tube Daily  . potassium chloride  40 mEq Per Tube BID  . sodium chloride flush  10-40 mL Intracatheter Q12H    Continuous Inpatient Infusions:   . sodium chloride Stopped (12/09/17 2200)  . ampicillin-sulbactam (UNASYN) IV Stopped (12/12/17 1351)  . dextrose 50 mL/hr at 12/12/17 1029  . feeding supplement (VITAL AF 1.2 CAL) 1,000 mL (12/12/17 0848)  . heparin 1,200 Units/hr (12/12/17 1442)  . phenylephrine (NEO-SYNEPHRINE) Adult infusion 30 mcg/min (12/12/17 1502)  . propofol (DIPRIVAN) infusion 30 mcg/kg/min (12/12/17 1503)  . vancomycin      PRN Inpatient Medications:  sodium chloride, acetaminophen **OR** acetaminophen, bisacodyl, fentaNYL (SUBLIMAZE) injection, ipratropium-albuterol, labetalol, lip balm, phenol, sodium chloride flush  Review of Systems:  Unable to determine based on patient's  current status    Physical Examination: BP (!) 92/51   Pulse 88   Temp 98.5 F (36.9 C) (Oral)   Resp 15   Ht 5' 9"  (1.753 m)   Wt 62.5 kg   SpO2 96%   BMI 20.35 kg/m  Gen: NAD, alert and oriented x 4 HEENT: + NGT with tube feedings, ETT in place on mechanical ventilation Neck: supple, no JVD or thyromegaly Chest: Rhonchi bilaterally CV: RRR, no m/g/c/r Abd: soft, BS present Neuro: arousable, but confused. Unable to converse  Data: Lab Results  Component Value Date   WBC 10.6 12/12/2017   HGB 9.3 (L) 12/12/2017   HCT 26.7 (L) 12/12/2017   MCV 96.8 12/12/2017   PLT 157 12/12/2017   Recent Labs  Lab 12/10/17 0239 12/11/17 0431 12/12/17 0424  HGB 11.9* 9.7* 9.3*   Lab Results  Component Value Date   NA 146 (H) 12/12/2017   K 2.9 (L) 12/12/2017   CL 99 12/12/2017   CO2 38 (H) 12/12/2017   BUN 28 (H) 12/12/2017   CREATININE 1.02 12/12/2017   Lab Results  Component Value Date   ALT 31 12/06/2017   AST 40 12/06/2017   ALKPHOS 68 12/06/2017   BILITOT 0.7 12/06/2017   Recent Labs  Lab 12/09/17 1856  APTT 63*  INR 1.08   Assessment/Plan: Marc [name] is a 77 y.o. male seen in consultation for PEG tube placement.   ENT has seen the patient  and plan is for tracheostomy tomorrow morning   Recommendations:  1. Plan for endoscopy with gastrostomy at a time to be determined after tracheostomy placement 2. Will continue to follow 3. Please call with any concerns or worsening in patient's condition  Please call with questions or concerns.   Octavia Bruckner, PA-C [facility]  [phone]

## 2017-12-13 ENCOUNTER — Encounter
Admission: EM | Disposition: A | Discharge status: Skilled Nursing Facility | Payer: Self-pay | Source: Home / Self Care | Attending: Internal Medicine

## 2017-12-13 ENCOUNTER — Inpatient Hospital Stay: Payer: PPO | Admitting: Anesthesiology

## 2017-12-13 ENCOUNTER — Encounter: Payer: Self-pay | Admitting: Certified Registered Nurse Anesthetist

## 2017-12-13 HISTORY — PX: TRACHEOSTOMY TUBE PLACEMENT: SHX814

## 2017-12-13 LAB — CBC
HCT: 26.2 % — ABNORMAL LOW (ref 40.0–52.0)
HEMOGLOBIN: 9.1 g/dL — AB (ref 13.0–18.0)
MCH: 33.8 pg (ref 26.0–34.0)
MCHC: 34.6 g/dL (ref 32.0–36.0)
MCV: 97.6 fL (ref 80.0–100.0)
Platelets: 184 10*3/uL (ref 150–440)
RBC: 2.68 MIL/uL — AB (ref 4.40–5.90)
RDW: 14 % (ref 11.5–14.5)
WBC: 9.9 10*3/uL (ref 3.8–10.6)

## 2017-12-13 LAB — GLUCOSE, CAPILLARY
GLUCOSE-CAPILLARY: 120 mg/dL — AB (ref 70–99)
GLUCOSE-CAPILLARY: 149 mg/dL — AB (ref 70–99)
GLUCOSE-CAPILLARY: 122 mg/dL — AB (ref 70–99)
GLUCOSE-CAPILLARY: 166 mg/dL — AB (ref 70–99)

## 2017-12-13 LAB — COMPREHENSIVE METABOLIC PANEL
ALBUMIN: 2.3 g/dL — AB (ref 3.5–5.0)
ALT: 64 U/L — AB (ref 0–44)
AST: 31 U/L (ref 15–41)
Alkaline Phosphatase: 115 U/L (ref 38–126)
Anion gap: 8 (ref 5–15)
BUN: 30 mg/dL — AB (ref 8–23)
CALCIUM: 7.4 mg/dL — AB (ref 8.9–10.3)
CO2: 33 mmol/L — AB (ref 22–32)
CREATININE: 1 mg/dL (ref 0.61–1.24)
Chloride: 103 mmol/L (ref 98–111)
GFR calc Af Amer: >60 mL/min (ref >60)
GFR calc non Af Amer: >60 mL/min (ref >60)
GLUCOSE: 130 mg/dL — AB (ref 70–99)
Potassium: 3.7 mmol/L (ref 3.5–5.1)
SODIUM: 144 mmol/L (ref 135–145)
Total Bilirubin: 1.1 mg/dL (ref 0.3–1.2)
Total Protein: 5.2 g/dL — ABNORMAL LOW (ref 6.5–8.1)

## 2017-12-13 LAB — CULTURE, RESPIRATORY W GRAM STAIN

## 2017-12-13 LAB — CULTURE, RESPIRATORY

## 2017-12-13 LAB — TRIGLYCERIDES: Triglycerides: 840 mg/dL — ABNORMAL HIGH (ref <150)

## 2017-12-13 LAB — MAGNESIUM: Magnesium: 1.7 mg/dL (ref 1.7–2.4)

## 2017-12-13 LAB — HEPARIN LEVEL (UNFRACTIONATED): HEPARIN UNFRACTIONATED: 0.32 [IU]/mL (ref 0.30–0.70)

## 2017-12-13 SURGERY — CREATION, TRACHEOSTOMY
Anesthesia: General | Site: Neck | Wound class: "Clean "

## 2017-12-13 MED ORDER — FENTANYL 2500MCG IN NS 250ML (10MCG/ML) PREMIX INFUSION
25.0000 ug/h | INTRAVENOUS | Status: AC
  Administered 2017-12-13: 50 ug/h via INTRAVENOUS
  Administered 2017-12-14: 125 ug/h via INTRAVENOUS
  Administered 2017-12-14: 25 ug/h via INTRAVENOUS
  Administered 2017-12-15 (×2): 250 ug/h via INTRAVENOUS
  Administered 2017-12-16: 225 ug/h via INTRAVENOUS
  Administered 2017-12-16 (×2): 250 ug/h via INTRAVENOUS
  Administered 2017-12-17: 225 ug/h via INTRAVENOUS
  Administered 2017-12-17: 300 ug/h via INTRAVENOUS
  Administered 2017-12-17: 400 ug/h via INTRAVENOUS
  Administered 2017-12-18: 300 ug/h via INTRAVENOUS
  Administered 2017-12-18: 50 ug/h via INTRAVENOUS
  Administered 2017-12-19: 300 ug/h via INTRAVENOUS
  Administered 2017-12-19: 350 ug/h via INTRAVENOUS
  Administered 2017-12-19: 100 ug/h via INTRAVENOUS
  Administered 2017-12-20: 400 ug/h via INTRAVENOUS
  Administered 2017-12-20: 200 ug/h via INTRAVENOUS
  Administered 2017-12-20: 250 ug/h via INTRAVENOUS
  Administered 2017-12-21: 400 ug/h via INTRAVENOUS
  Filled 2017-12-13 (×19): qty 250

## 2017-12-13 MED ORDER — FENTANYL CITRATE (PF) 100 MCG/2ML IJ SOLN
50.0000 ug | Freq: Once | INTRAMUSCULAR | Status: AC
  Administered 2017-12-13: 50 ug via INTRAVENOUS
  Filled 2017-12-13: qty 2

## 2017-12-13 MED ORDER — SODIUM CHLORIDE 0.9 % IV SOLN
INTRAVENOUS | Status: DC | PRN
  Administered 2017-12-13: 4 mL

## 2017-12-13 MED ORDER — MIDAZOLAM HCL 5 MG/5ML IJ SOLN
INTRAMUSCULAR | Status: AC
  Filled 2017-12-13: qty 5

## 2017-12-13 MED ORDER — PROPOFOL 10 MG/ML IV BOLUS
INTRAVENOUS | Status: DC | PRN
  Administered 2017-12-13: 40 mg via INTRAVENOUS

## 2017-12-13 MED ORDER — MAGNESIUM SULFATE 2 GM/50ML IV SOLN
2.0000 g | Freq: Once | INTRAVENOUS | Status: AC
  Administered 2017-12-13: 2 g via INTRAVENOUS
  Filled 2017-12-13: qty 50

## 2017-12-13 MED ORDER — BISACODYL 10 MG RE SUPP
10.0000 mg | Freq: Every day | RECTAL | Status: DC | PRN
Start: 2017-12-13 — End: 2017-12-28

## 2017-12-13 MED ORDER — FENTANYL BOLUS VIA INFUSION
25.0000 ug | INTRAVENOUS | Status: DC | PRN
  Administered 2017-12-14 – 2017-12-21 (×23): 50 ug via INTRAVENOUS
  Filled 2017-12-13: qty 50

## 2017-12-13 MED ORDER — HEPARIN (PORCINE) IN NACL 100-0.45 UNIT/ML-% IJ SOLN
1250.0000 [IU]/h | INTRAMUSCULAR | Status: DC
  Administered 2017-12-13 – 2017-12-15 (×4): 1200 [IU]/h via INTRAVENOUS
  Filled 2017-12-13 (×3): qty 250

## 2017-12-13 MED ORDER — SODIUM CHLORIDE 0.9 % IV SOLN
INTRAVENOUS | Status: DC | PRN
  Administered 2017-12-13: 08:00:00 via INTRAVENOUS

## 2017-12-13 MED ORDER — SENNOSIDES 8.8 MG/5ML PO SYRP
5.0000 mL | ORAL_SOLUTION | Freq: Two times a day (BID) | ORAL | Status: DC | PRN
  Filled 2017-12-13: qty 5

## 2017-12-13 MED ORDER — SODIUM CHLORIDE 0.9 % IV SOLN
INTRAVENOUS | Status: DC | PRN
  Administered 2017-12-13: 40 ug/min via INTRAVENOUS

## 2017-12-13 MED ORDER — FENTANYL CITRATE (PF) 100 MCG/2ML IJ SOLN
INTRAMUSCULAR | Status: AC
  Filled 2017-12-13: qty 2

## 2017-12-13 MED ORDER — MIDAZOLAM HCL 2 MG/2ML IJ SOLN
INTRAMUSCULAR | Status: DC | PRN
  Administered 2017-12-13: 5 mg via INTRAVENOUS

## 2017-12-13 MED ORDER — ROCURONIUM BROMIDE 100 MG/10ML IV SOLN
INTRAVENOUS | Status: DC | PRN
  Administered 2017-12-13: 50 mg via INTRAVENOUS

## 2017-12-13 MED ORDER — PROPOFOL 10 MG/ML IV BOLUS
INTRAVENOUS | Status: AC
  Filled 2017-12-13: qty 20

## 2017-12-13 MED ORDER — FENTANYL CITRATE (PF) 100 MCG/2ML IJ SOLN
INTRAMUSCULAR | Status: DC | PRN
  Administered 2017-12-13 (×2): 50 ug via INTRAVENOUS

## 2017-12-13 MED ORDER — PHENYLEPHRINE HCL 10 MG/ML IJ SOLN
INTRAMUSCULAR | Status: DC | PRN
  Administered 2017-12-13: 100 ug via INTRAVENOUS
  Administered 2017-12-13: 200 ug via INTRAVENOUS
  Administered 2017-12-13: 100 ug via INTRAVENOUS

## 2017-12-13 SURGICAL SUPPLY — 28 items
BLADE SURG 15 STRL LF DISP TIS (BLADE) ×1 IMPLANT
BLADE SURG 15 STRL SS (BLADE) ×2
BLADE SURG SZ11 CARB STEEL (BLADE) ×3 IMPLANT
CANISTER SUCT 1200ML W/VALVE (MISCELLANEOUS) ×3 IMPLANT
ELECT REM PT RETURN 9FT ADLT (ELECTROSURGICAL) ×3
ELECTRODE REM PT RTRN 9FT ADLT (ELECTROSURGICAL) ×1 IMPLANT
GAUZE PACKING IODOFORM 1/2 (PACKING) IMPLANT
GLOVE BIO SURGEON STRL SZ7.5 (GLOVE) ×3 IMPLANT
GOWN STRL REUS W/ TWL LRG LVL3 (GOWN DISPOSABLE) ×2 IMPLANT
GOWN STRL REUS W/TWL LRG LVL3 (GOWN DISPOSABLE) ×4
HEMOSTAT SURGICEL 2X3 (HEMOSTASIS) IMPLANT
HLDR TRACH TUBE NECKBAND 18 (MISCELLANEOUS) ×1 IMPLANT
HOLDER TRACH TUBE NECKBAND 18 (MISCELLANEOUS) ×2
KIT TURNOVER KIT A (KITS) ×3 IMPLANT
LABEL OR SOLS (LABEL) ×3 IMPLANT
NS IRRIG 500ML POUR BTL (IV SOLUTION) ×3 IMPLANT
PACK HEAD/NECK (MISCELLANEOUS) ×3 IMPLANT
SHEARS HARMONIC 9CM CVD (BLADE) ×3 IMPLANT
SPONGE DRAIN TRACH 4X4 STRL 2S (GAUZE/BANDAGES/DRESSINGS) ×3 IMPLANT
SPONGE KITTNER 5P (MISCELLANEOUS) ×3 IMPLANT
SUCTION FRAZIER HANDLE 10FR (MISCELLANEOUS)
SUCTION TUBE FRAZIER 10FR DISP (MISCELLANEOUS) IMPLANT
SUT SILK 2 0 (SUTURE) ×4
SUT SILK 2 0 SH (SUTURE) ×12 IMPLANT
SUT SILK 2-0 30XBRD TIE 12 (SUTURE) ×2 IMPLANT
SUT VIC AB 3-0 PS2 18 (SUTURE) IMPLANT
TUBE TRACH SHILEY  6 DIST  CUF (TUBING) IMPLANT
TUBE TRACH SHILEY 8 DIST CUF (TUBING) ×2 IMPLANT

## 2017-12-13 NOTE — Anesthesia Post-op Follow-up Note (Signed)
Anesthesia QCDR form completed.        

## 2017-12-13 NOTE — H&P (Signed)
History and physical reviewed and will be scanned in later. No change in medical status reported by the nursing staff. Procedure discussed with wife and all questions answered. She expressed understanding of the procedure.  Riley Nearing @TODAY @

## 2017-12-13 NOTE — Anesthesia Postprocedure Evaluation (Signed)
Anesthesia Post Note  Patient: Marc [name]  Procedure(s) Performed: TRACHEOSTOMY (N/A Neck)  Patient location during evaluation: ICU Anesthesia Type: General Level of consciousness: sedated Pain management: pain level controlled Vital Signs Assessment: post-procedure vital signs reviewed and stable Respiratory status: patient remains intubated per anesthesia plan Cardiovascular status: stable Postop Assessment: no apparent nausea or vomiting Anesthetic complications: no     Last Vitals:  Vitals:   12/13/17 0500 12/13/17 0600  BP: 115/67 (!) 111/96  Pulse: 86 85  Resp: 15 18  Temp:    SpO2: 95% 99%    Last Pain:  Vitals:   12/13/17 0400  TempSrc: Oral  PainSc:                  Durenda Hurt

## 2017-12-13 NOTE — Anesthesia Procedure Notes (Signed)
Date/Time: 12/13/2017 7:55 AM Performed by: Eben Burow, CRNA Pre-anesthesia Checklist: Patient identified, Emergency Drugs available, Suction available, Patient being monitored and Timeout performed Patient Re-evaluated:Patient Re-evaluated prior to induction Oxygen Delivery Method: Circle system utilized Preoxygenation: Pre-oxygenation with 100% oxygen Induction Type: Inhalational induction with existing ETT Placement Confirmation: positive ETCO2 and breath sounds checked- equal and bilateral Comments: Patient presents to OR for placement of tracheostomy tube - inhalation induction with existing #8.0 OETT. Patient tolerated well.

## 2017-12-13 NOTE — Progress Notes (Signed)
Pharmacy Electrolyte Monitoring Consult:  Pharmacy consulted to assist in monitoring and replacing electrolytes in this 77 y.o. male admitted on 11/29/2017 with Weakness and Dizziness  Patient received tracheostomy on 8/16. Patient is currently sedated on propofol.  Labs:  Sodium (mmol/L)  Date Value  12/13/2017 144   Potassium (mmol/L)  Date Value  12/13/2017 3.7   Magnesium (mg/dL)  Date Value  12/13/2017 1.7   Phosphorus (mg/dL)  Date Value  12/12/2017 2.8   Calcium (mg/dL)  Date Value  12/13/2017 7.4 (L)   Albumin (g/dL)  Date Value  12/13/2017 2.3 (L)   Corrected Calcium: 8.4  Assessment/Plan: Magnesium sulfate 2 g IV x 1. Goal magnesium ~ 2. Will recheck electrolytes with am labs.  Pharmacy will continue to monitor and adjust per consult.   Lavena Bullion PharmD Student 12/13/2017 1:43 PM

## 2017-12-13 NOTE — Progress Notes (Signed)
[facility] NAME: Kalib Bhagat    MR#:  [id number]  DATE OF BIRTH:  02/07/1941  SUBJECTIVE:  CHIEF COMPLAINT:   Chief Complaint  Patient presents with  . Weakness  . Dizziness    -Patient had a tracheostomy placed today - will need PEG, wife at bedside  REVIEW OF SYSTEMS:  Review of Systems  Unable to perform ROS: Critical illness    DRUG ALLERGIES:   Allergies  Allergen Reactions  . Glimepiride   . Tape Itching and Rash    VITALS:  Blood pressure (!) 111/96, pulse 85, temperature 98.6 F (37 C), temperature source Oral, resp. rate 18, height 5\' 9"  (1.753 m), weight 62.5 kg, SpO2 99 %.  PHYSICAL EXAMINATION:  Physical Exam   GENERAL:  77 y.o.-year-old patient lying in the bed, ill-appearing,  EYES: Pupils equal, round, reactive to light and accommodation. No scleral icterus. Extraocular muscles intact.  HEENT: Head atraumatic, normocephalic. Oropharynx and nasopharynx clear.  Orally intubated NECK:  Supple, no jugular venous distention. No thyroid enlargement, no tenderness.  LUNGS: Normal breath sounds bilaterally, no wheezing, rales  or crepitation. No use of accessory muscles of respiration.  Bibasilar rhonchi heard. CARDIOVASCULAR: S1, S2 normal. No  rubs, or gallops.  2/6 systolic murmur is present ABDOMEN: Soft, nontender, nondistended. Bowel sounds present. No organomegaly or mass.  EXTREMITIES: No pedal edema, cyanosis, or clubbing.  NEUROLOGIC: sedated, able to move all extremities in bed when sedation is decreased PSYCHIATRIC: The patient is sedated SKIN: No obvious rash, lesion, or ulcer.    LABORATORY PANEL:   CBC Recent Labs  Lab 12/13/17 0412  WBC 9.9  HGB 9.1*  HCT 26.2*  PLT 184   ------------------------------------------------------------------------------------------------------------------  Chemistries  Recent Labs  Lab 12/13/17 0412  NA 144  K 3.7  CL 103  CO2 33*  GLUCOSE  130*  BUN 30*  CREATININE 1.00  CALCIUM 7.4*  MG 1.7  AST 31  ALT 64*  ALKPHOS 115  BILITOT 1.1   ------------------------------------------------------------------------------------------------------------------  Cardiac Enzymes Recent Labs  Lab 12/12/17 0424  TROPONINI 0.51*   ------------------------------------------------------------------------------------------------------------------  RADIOLOGY:  Dg Chest Port 1 View  Result Date: 12/12/2017 CLINICAL DATA:  Respiratory failure EXAM: PORTABLE CHEST 1 VIEW COMPARISON:  December 09, 2017 FINDINGS: The right PICC line terminates near the caval atrial junction. The ETT is in good position. The NG tube terminates below today's film. No pneumothorax. Stable cardiomediastinal silhouette. Stable opacity in the left base. Mild opacity in the right base is also similar in the interval. No other changes. IMPRESSION: 1. Support apparatus as above. 2. Bibasilar opacities are similar in the interval. Electronically Signed   By: Dorise Bullion III M.D   On: 12/12/2017 07:36    EKG:   Orders placed or performed during the hospital encounter of 11/29/17  . ED EKG  . ED EKG  . EKG 12-Lead  . EKG 12-Lead  . EKG 12-Lead  . EKG 12-Lead  . EKG 12-Lead  . EKG 12-Lead  . EKG 12-Lead  . EKG 12-Lead  . EKG 12-Lead  . EKG 12-Lead  . EKG 12-Lead  . EKG 12-Lead  . EKG 12-Lead  . EKG 12-Lead  . EKG 12-Lead  . EKG 12-Lead    ASSESSMENT AND PLAN:   77 year old male with past medical history significant for hypertension, hyperlipidemia and recent diagnosis of H. pylori gastritis on 11/27/2017 presents to hospital from home secondary to weakness, confusion.  1.  Acute respiratory failure- Initially intubated for possible aspiration, extubated however reintubated on 12/09/2017. -Chest x-[place] with pulmonary vascular congestion as well. -Vent management per intensivist. S/p Trach today, will need PEG -Echocardiogram if not done recently  -also  concern for aspiration pneumonia.  on Unasyn  2.  Acute encephalopathy-appreciate neurology consult.  Initially considered to be secondary to PRES syndrome -MRI of the brain showing mild volume loss and chronic ischemic changes, no acute findings. -Lumbar puncture was negative -Patient still encephalopathic.  MRI of the lumbar spine and thoracic spine ordered for lower extremity weakness and repeat CT of the head -Also myasthenia gravis labs, labs for para-neoplastic syndrome ordered -  if no improvement recommend repeat MRI brain  3.  H. pylori gastritis-diagnosed with biopsy from recent endoscopy about 2 weeks ago. -Continue treatment with Protonix, amoxicillin and clarithromycin  4.  Hypertension-labile blood pressures. -Continue metoprolol and Norvasc for now and monitor  5.  Right upper extremity DVT-has a PICC line currently in that arm. -Nonocclusive DVT in the right subclavian and an occlusive DVT in the right cephalic vein seen. -on IV heparin drip for now   Critically ill, guarded prognosis   All the records are reviewed and case discussed with Care Management/Social Workerr. Management plans discussed with the patient, family and they are in agreement.  CODE STATUS: Full Code  TOTAL TIME TAKING CARE OF THIS PATIENT: 28 minutes.   POSSIBLE D/C IN ? DAYS, DEPENDING ON CLINICAL CONDITION.   Gladstone Lighter M.D on 12/13/2017 at 12:51 PM  Between 7am to 6pm - Pager - [phone]  After 6pm go to [email or web address] - password [email or web address]  Office  [phone]  CC: Primary care physician; Tracie Harrier, MD

## 2017-12-13 NOTE — Progress Notes (Signed)
ANTICOAGULATION CONSULT NOTE  Pharmacy Consult for heparin drip management  Indication: DVT  Patient Measurements: Height: 5\' 9"  (175.3 cm) Weight: 137 lb 12.6 oz (62.5 kg) IBW/kg (Calculated) : 70.7   Vital Signs: Temp: 98.6 F (37 C) (08/16 1900) Temp Source: Axillary (08/16 1900) BP: 106/63 (08/16 2200) Pulse Rate: 91 (08/16 2200)  Labs: Recent Labs    12/11/17 0431 12/11/17 0816 12/12/17 0424 12/12/17 1224 12/13/17 0412 12/13/17 2242  HGB 9.7*  --  9.3*  --  9.1*  --   HCT 28.8*  --  26.7*  --  26.2*  --   PLT 153  --  157  --  184  --   HEPARINUNFRC 0.40  --  0.31 0.25*  --  0.32  CREATININE  --   --  1.02  --  1.00  --   TROPONINI  --  0.79* 0.51*  --   --   --     Estimated Creatinine Clearance: 56.4 mL/min (by C-G formula based on SCr of 1 mg/dL).  Medical History: Past Medical History:  Diagnosis Date  . Anxiety   . BPH (benign prostatic hyperplasia)   . Colon adenomas    history of  . Diverticulosis   . Hyperlipidemia   . Hypertension    Pharmacy consulted for heparin drip management for 77 yo male admitted with AMS and being treated for DVT of right upper extremety. Patient remains sedated and requiring mechanical ventilation.  Goal of Therapy:  Heparin level 0.3-0.7 units/ml Monitor platelets by anticoagulation protocol: Yes    Assessment/Plan:  Heparin level is below goal. Will bolus heparin 1000 units IV and increase infusion to 1200 units/hr. Will not recheck a HL as heparin is to be stopped tonight at midnight in anticipation of trach/PEG tomorrow.   08/16 @ 2300 HL 0.32 therapeutic. Will continue current rate @ 1200 units/hr and will recheck HL @ 0700. hgb trending down will continue to monitor.  Tobie Lords, PharmD Clinical Pharmacist 12/13/2017 11:09 PM

## 2017-12-13 NOTE — Progress Notes (Signed)
[facility] for heparin drip management  Indication: DVT  Patient Measurements: Height: 5\' 9"  (175.3 cm) Weight: 137 lb 12.6 oz (62.5 kg) IBW/kg (Calculated) : 70.7   Vital Signs: Temp: 98.6 F (37 C) (08/16 0400) Temp Source: Oral (08/16 0400) BP: 111/96 (08/16 0600) Pulse Rate: 85 (08/16 0600)  Labs: Recent Labs    12/11/17 0431 12/11/17 0816 12/12/17 0424 12/12/17 1224 12/13/17 0412  HGB 9.7*  --  9.3*  --  9.1*  HCT 28.8*  --  26.7*  --  26.2*  PLT 153  --  157  --  184  HEPARINUNFRC 0.40  --  0.31 0.25*  --   CREATININE  --   --  1.02  --  1.00  TROPONINI  --  0.79* 0.51*  --   --     Estimated Creatinine Clearance: 56.4 mL/min (by C-G formula based on SCr of 1 mg/dL).  Medical History: Past Medical History:  Diagnosis Date  . Anxiety   . BPH (benign prostatic hyperplasia)   . Colon adenomas    history of  . Diverticulosis   . Hyperlipidemia   . Hypertension    Pharmacy consulted for heparin drip management for 77 yo male admitted with AMS and being treated for DVT of right upper extremety. Patient received tracheostomy on 8/16. Patient remains sedated and requiring mechanical ventilation.  Goal of Therapy:  Heparin level 0.3-0.7 units/ml Monitor platelets by anticoagulation protocol: Yes   Assessment/Plan:  Heparin was held for tracheostomy on 8/16. Will restart heparin infusion at 1200 units/hr. Will check next anti-X @ 2230.  Lavena Bullion PharmD Student 12/13/2017 2:17 PM

## 2017-12-13 NOTE — Op Note (Signed)
12/13/2017  8:32 AM    Marc [name]  [id number]   Pre-Op Diagnosis:  Respiratory Failure  Post-op Diagnosis: Same  Procedure: Elective Tracheostomy  Surgeon:  Riley Nearing  Assistant: none  Anesthesia:  General endotracheal anesthesia  EBL:  Less than 10 cc  Complications:  None  Findings: None  Procedure: The patient was taken to the Operating Room from the CCU, already intubated, and placed in the supine position.  After induction of general anesthesia, the patient was placed on a shoulder roll with the neck extended. The skin was injected along the proposed incision line over the trachea with 1% lidocaine with epinephrine, 1:200,000. The area was then prepped and draped in the usual sterile fashion.  A 15 blade was then used to incise the skin in a horizontal incision over the trachea. The dissection was carried down to the subcutaneous tissues and through the platysma with the Bovie. Anterior jugular veins were divided with the harmonic scalpel for hemostasis. The strap muscles were divided in the midline and retracted laterally. The thyroid isthmus was exposed and divided in the midline over the trachea and dissected away from the anterior aspect of the trachea with the Harmonic Scalpel. With hemostasis obtained, the anesthesiologist was alerted that the airway was about to be entered so that the oxygen concentration could be lowered to reduce fire risk. The scrub tech prepared the tracheostomy tube, confirming no leak in the balloon cuff. The trachea was then incised between the 2nd and third tracheal rings, and an inferiorly based tracheal flap created by cutting through the third tracheal ring laterally. This was sutured up to the skin with a 3-0 silk suture to help create a tracheocutaneous tract. The airway was suctioned and, after the anesthesiologist pulled the endotracheal tube back,  a #8 Shiley tracheostomy tube was inserted into the tracheal lumen. The inner cannula was  placed, the cuff inflated,  and the patient hooked to the anesthesia circuit for ventilation. CO2 return was initially noted, but a short time after lost, and O2 sat declined. The tracheostomy tube was suctioned and significant thick mucous cleared from the trachea. The O2 began to come up at this point. The trachea was lavaged with sterile saline and resuctioned and sats returned to 100% and remained stable. Adequate ventilation was confirmed with the anesthesiologist. Hemostasis was confirmed and the flange of the tracheostomy tube was sutured to the skin with 3-0 silk suture. A trach tie was placed around the neck to further secure the tracheostomy tube. Betadine soaked gauze was placed around the wound.  The patient was then returned to the anesthesiologist and taken to the CCU in stable condition.  Disposition:   Return to the CCU  Plan: Routine trach care and suctioning each shift and PRN. Vent settings per CCU admitting physician. Sutures can be removed in a week.  Riley Nearing 12/13/2017 8:32 AM

## 2017-12-13 NOTE — Progress Notes (Signed)
Pharmacy Antibiotic Note  Marc [name] is a 77 y.o. male admitted on 11/29/2017 with AMS. Patient was re-intubated subsequent to cardiac arrest 8/13.  Pharmacy has been consulted for Unasyn dosing for aspiration pnemonia. Patient has been on H pylori regimen consisting of clarithromycin, amoxicillin, and pantoprazole that ends 8/16.   Plan: Continue vancomycin 750mg  IV Q12hr for goal trough of 15-20. Will obtain trough prior to morning dose on 8/17.   Due to MRSA in tracheal aspirate, Unasyn discontinued.   Height: 5\' 9"  (175.3 cm) Weight: 137 lb 12.6 oz (62.5 kg) IBW/kg (Calculated) : 70.7  Temp (24hrs), Avg:99 F (37.2 C), Min:98.6 F (37 C), Max:99.2 F (37.3 C)  Recent Labs  Lab 12/06/17 2035  12/09/17 0527 12/09/17 1856 12/10/17 0239 12/11/17 0431 12/12/17 0424 12/13/17 0412  WBC  --    < > 5.3 12.2* 14.3* 11.3* 10.6 9.9  CREATININE 1.56*   < > 1.23 1.20 1.15  --  1.02 1.00  LATICACIDVEN 1.8  --   --   --   --   --   --   --    < > = values in this interval not displayed.    Estimated Creatinine Clearance: 56.4 mL/min (by C-G formula based on SCr of 1 mg/dL).    Allergies  Allergen Reactions  . Glimepiride   . Tape Itching and Rash    Antimicrobials this admission: Zosyn 8/2 >> 8/3 vancomycin 8/2 >> 8/6 Acyclovir 8/3 >> 8/8 Ampicillin/amoxicillin 8/3 >> 8/11 clarithormycin 8/6 >> 8/16 Ceftriaxone 8/3 >> 8/5 Unasyn 8/14 >> 8/16 Vancomycin 8/15 >>  Dose adjustments this admission:   Microbiology results: 8/5 BCx: NG 8/2 BCx: NG 8/2 UCx: insignificant growth  8/5 CSF: NG 8/3 MRSA PCR: negative 8/13 UCx: NG 8/13 BCx: NGTD 8/13 TA: MRSA 8/14 TA: MRSA    Thank you for allowing pharmacy to be a part of this patient's care.  Simpson,Michael L 12/13/2017 4:44 PM

## 2017-12-13 NOTE — Progress Notes (Signed)
CRITICAL CARE NOTE  CC  follow up respiratory failure with generalized weakness  SUBJECTIVE Patient remains critically ill Prognosis is guarded  77 year old male with acute encephalopathy, sepsis, pneumonia, and dysphagia/dysphonia who was admitted on 8/2. Patient remains intubated and sedated. Consults placed for tracheostomy and peg tube. Tracheostomy scheduled for tomorrow morning.    SIGNIFICANT EVENTS  No significant events last 24 hours. Patient going for tracheostomy this morning  BP (!) 111/96   Pulse 85   Temp 98.6 F (37 C) (Oral)   Resp 18   Ht 5\' 9"  (1.753 m)   Wt 62.5 kg   SpO2 99%   BMI 20.35 kg/m    REVIEW OF SYSTEMS  PATIENT IS UNABLE TO PROVIDE COMPLETE REVIEW OF SYSTEM S DUE TO SEVERE CRITICAL ILLNESS AND ENCEPHALOPATHY   PHYSICAL EXAMINATION:  GENERAL:critically ill appearing, +resp distress HEAD: Normocephalic, atraumatic.  EYES: Pupils equal, round, reactive to light.  No scleral icterus.  MOUTH: Moist mucosal membrane. NECK: Supple. No thyromegaly. No nodules. No JVD.  PULMONARY: Rhonchi bilaterally, no wheezes/rales.  CARDIOVASCULAR: S1 and S2. Regular rate and rhythm. No murmurs, rubs, or gallops. 2+ dorsalis pedis pulses bilaterally GASTROINTESTINAL: Soft, nontender, -distended. No masses. Positive bowel sounds. No hepatosplenomegaly.  MUSCULOSKELETAL: No swelling, clubbing, or edema.  NEUROLOGIC: obtunded, GCS<8 SKIN:intact,warm,dry  ASSESSMENT AND PLAN SYNOPSIS   77 year old male admitted on 8/2 for generalized weakness and acute encephalopathy. Patient remains intubated and sedated. Currently being treated for aspiration pneumonia with unasyn. Will proceed with tracheostomy and peg tube placement.    Severe Hypoxic Respiratory Failure secondary to aspiration pneumonia. Completing a course of Unasyn for aspiration pneumonia, will check chest x-Khrystian post tracheostomy  rerenal azotemia. BUN 30/creatinine 1. Will support hemodynamics,  avoid nephrotoxic agents   Altered mental status/failure to thrive. Appreciate neurology's assistance. Will follow all recommendations. Patient has had extensive workup to include MRI of the lumbar and thoracic spine, CT scan of the head, serologic studies for myopathy/neuropathy   H. Pylori. Compoeting treatment with clarithromycin, amoxicillin and Protonix  Hypotension. Most likely sepsis induced. Weaning Neo-Synephrine as tolerated  DVT and SVT of right upper arm. Continue anticoagulation on hold per tracheostomy   Critical Care Time devoted to patient care services described in this note is 35 minutes.   Marc Dellen, DO  Patient ID: Marc [name], male   DOB: 09-Apr-1941, 77 y.o.   MRN: [id number]

## 2017-12-13 NOTE — Transfer of Care (Signed)
Immediate Anesthesia Transfer of Care Note  Patient: Marc [name]  Procedure(s) Performed: TRACHEOSTOMY (N/A Neck)  Patient Location: ICU  Anesthesia Type:General  Level of Consciousness: sedated  Airway & Oxygen Therapy: Patient remains intubated per anesthesia plan and Patient placed on Ventilator (see vital sign flow sheet for setting)  Post-op Assessment: Report given to RN and Post -op Vital signs reviewed and stable  Post vital signs: Reviewed and stable  Last Vitals:  Vitals Value Taken Time  BP    Temp    Pulse    Resp    SpO2      Last Pain:  Vitals:   12/13/17 0400  TempSrc: Oral  PainSc:          Complications: No apparent anesthesia complications

## 2017-12-13 NOTE — Care Management (Addendum)
Received authorization from [employer] for transfer to [facility].  Auth Number [id number]. This Marc [name] is good for 5 days.   Spoke with Dr Jefferson Fuel regarding medical stability for transfer.  Patient had trach placed today and did have some bleeding and mucus plugging. Patient needs to be observed over night. Discussed that patient does not have to have the PEG placed prior to transfer- that this can be performed at [facility].   If patient is stable, he can transfer 8/17.  Updated patient wife, Marc [name] and  Marc [name] with [employer] that transfer would not occur today.

## 2017-12-14 ENCOUNTER — Inpatient Hospital Stay: Payer: PPO

## 2017-12-14 LAB — VANCOMYCIN, TROUGH: Vancomycin Tr: 20 ug/mL (ref 15–20)

## 2017-12-14 LAB — GLUCOSE, CAPILLARY
GLUCOSE-CAPILLARY: 142 mg/dL — AB (ref 70–99)
GLUCOSE-CAPILLARY: 150 mg/dL — AB (ref 70–99)
GLUCOSE-CAPILLARY: 154 mg/dL — AB (ref 70–99)
GLUCOSE-CAPILLARY: 166 mg/dL — AB (ref 70–99)
Glucose-Capillary: 121 mg/dL — ABNORMAL HIGH (ref 70–99)
Glucose-Capillary: 132 mg/dL — ABNORMAL HIGH (ref 70–99)

## 2017-12-14 LAB — BASIC METABOLIC PANEL
Anion gap: 5 (ref 5–15)
BUN: 26 mg/dL — AB (ref 8–23)
CALCIUM: 7.3 mg/dL — AB (ref 8.9–10.3)
CO2: 34 mmol/L — ABNORMAL HIGH (ref 22–32)
Chloride: 103 mmol/L (ref 98–111)
Creatinine, Ser: 0.98 mg/dL (ref 0.61–1.24)
GFR calc Af Amer: >60 mL/min (ref >60)
Glucose, Bld: 155 mg/dL — ABNORMAL HIGH (ref 70–99)
POTASSIUM: 3.4 mmol/L — AB (ref 3.5–5.1)
SODIUM: 142 mmol/L (ref 135–145)

## 2017-12-14 LAB — HEPARIN LEVEL (UNFRACTIONATED): HEPARIN UNFRACTIONATED: 0.31 [IU]/mL (ref 0.30–0.70)

## 2017-12-14 LAB — CBC
HCT: 24.1 % — ABNORMAL LOW (ref 40.0–52.0)
Hemoglobin: 8.6 g/dL — ABNORMAL LOW (ref 13.0–18.0)
MCH: 34.9 pg — ABNORMAL HIGH (ref 26.0–34.0)
MCHC: 35.6 g/dL (ref 32.0–36.0)
MCV: 97.9 fL (ref 80.0–100.0)
PLATELETS: 171 10*3/uL (ref 150–440)
RBC: 2.46 MIL/uL — ABNORMAL LOW (ref 4.40–5.90)
RDW: 13.9 % (ref 11.5–14.5)
WBC: 5.3 10*3/uL (ref 3.8–10.6)

## 2017-12-14 LAB — MAGNESIUM: MAGNESIUM: 1.8 mg/dL (ref 1.7–2.4)

## 2017-12-14 MED ORDER — POTASSIUM CHLORIDE 20 MEQ PO PACK
40.0000 meq | PACK | Freq: Once | ORAL | Status: AC
  Administered 2017-12-14: 40 meq
  Filled 2017-12-14: qty 2

## 2017-12-14 MED ORDER — MAGNESIUM SULFATE 2 GM/50ML IV SOLN
2.0000 g | Freq: Once | INTRAVENOUS | Status: AC
  Administered 2017-12-14: 2 g via INTRAVENOUS
  Filled 2017-12-14: qty 50

## 2017-12-14 MED ORDER — VECURONIUM BROMIDE 10 MG IV SOLR
10.0000 mg | Freq: Once | INTRAVENOUS | Status: AC
  Administered 2017-12-14: 10 mg via INTRAVENOUS
  Filled 2017-12-14: qty 10

## 2017-12-14 MED ORDER — MIDAZOLAM HCL 2 MG/2ML IJ SOLN
2.0000 mg | INTRAMUSCULAR | Status: DC | PRN
  Administered 2017-12-14 – 2017-12-21 (×18): 2 mg via INTRAVENOUS
  Filled 2017-12-14 (×18): qty 2

## 2017-12-14 MED ORDER — STERILE WATER FOR INJECTION IJ SOLN
INTRAMUSCULAR | Status: AC
  Administered 2017-12-14: 10 mL
  Filled 2017-12-14: qty 10

## 2017-12-14 MED ORDER — MIDAZOLAM HCL 2 MG/2ML IJ SOLN
INTRAMUSCULAR | Status: AC
  Administered 2017-12-14: 16:00:00
  Filled 2017-12-14: qty 2

## 2017-12-14 MED ORDER — POTASSIUM CHLORIDE 20 MEQ PO PACK
60.0000 meq | PACK | Freq: Four times a day (QID) | ORAL | Status: AC
  Administered 2017-12-14: 60 meq
  Filled 2017-12-14: qty 3

## 2017-12-14 MED ORDER — MIDAZOLAM HCL 2 MG/2ML IJ SOLN
2.0000 mg | Freq: Once | INTRAMUSCULAR | Status: AC
  Administered 2017-12-14: 2 mg via INTRAVENOUS

## 2017-12-14 MED ORDER — HYDRALAZINE HCL 20 MG/ML IJ SOLN: 10.0000 mg | Freq: Once | INTRAMUSCULAR | Status: DC

## 2017-12-14 MED ORDER — DEXMEDETOMIDINE HCL IN NACL 400 MCG/100ML IV SOLN
0.4000 ug/kg/h | INTRAVENOUS | Status: DC
  Administered 2017-12-14: 0.4 ug/kg/h via INTRAVENOUS
  Administered 2017-12-15 (×2): 1 ug/kg/h via INTRAVENOUS
  Administered 2017-12-16: 1.2 ug/kg/h via INTRAVENOUS
  Administered 2017-12-16: 1 ug/kg/h via INTRAVENOUS
  Administered 2017-12-16: 1.2 ug/kg/h via INTRAVENOUS
  Administered 2017-12-17: 1 ug/kg/h via INTRAVENOUS
  Administered 2017-12-17: 1.2 ug/kg/h via INTRAVENOUS
  Administered 2017-12-17: 1 ug/kg/h via INTRAVENOUS
  Administered 2017-12-17: 1.2 ug/kg/h via INTRAVENOUS
  Filled 2017-12-14 (×12): qty 100

## 2017-12-14 MED ORDER — MIDAZOLAM HCL 2 MG/2ML IJ SOLN
2.0000 mg | Freq: Once | INTRAMUSCULAR | Status: DC
  Filled 2017-12-14: qty 2

## 2017-12-14 NOTE — Progress Notes (Signed)
Pharmacy Electrolyte Monitoring Consult:  Pharmacy consulted to assist in monitoring and replacing electrolytes in this 77 y.o. male admitted on 11/29/2017 with Weakness and Dizziness  Patient received tracheostomy on 8/16. Patient is currently sedated on propofol.  Labs:  Sodium (mmol/L)  Date Value  12/14/2017 142   Potassium (mmol/L)  Date Value  12/14/2017 3.4 (L)   Magnesium (mg/dL)  Date Value  12/14/2017 1.8   Phosphorus (mg/dL)  Date Value  12/12/2017 2.8   Calcium (mg/dL)  Date Value  12/14/2017 7.3 (L)   Albumin (g/dL)  Date Value  12/13/2017 2.3 (L)   Corrected Calcium: 8.4  Assessment/Plan: Magnesium sulfate 2 g IV x 1. Goal magnesium ~ 2. Potasium 39mEq VT x 1. Goal potassium  ~ 4.   Will recheck electrolytes with am labs.  Pharmacy will continue to monitor and adjust per consult.   MLS 12/14/2017 7:09 AM

## 2017-12-14 NOTE — Progress Notes (Signed)
Pharmacy Antibiotic Note  Marc [name] is a 77 y.o. male admitted on 11/29/2017 with AMS. Patient was re-intubated subsequent to cardiac arrest 8/13.  Pharmacy has been consulted for Unasyn dosing for aspiration pnemonia. Patient has been on H pylori regimen consisting of clarithromycin, amoxicillin, and pantoprazole that ends 8/16.  Plan: 8/17 Vanc trough = 20 mcg/ml. Continue vancomycin 750mg  IV Q12hr for goal trough of 15-20.  Due to MRSA in tracheal aspirate, Unasyn discontinued.   Height: 5\' 9"  (175.3 cm) Weight: 137 lb 12.6 oz (62.5 kg) IBW/kg (Calculated) : 70.7  Temp (24hrs), Avg:98.3 F (36.8 C), Min:97.8 F (36.6 C), Max:98.6 F (37 C)  Recent Labs  Lab 12/09/17 1856 12/10/17 0239 12/11/17 0431 12/12/17 0424 12/13/17 0412 12/14/17 0446 12/14/17 0807  WBC 12.2* 14.3* 11.3* 10.6 9.9 5.3  --   CREATININE 1.20 1.15  --  1.02 1.00 0.98  --   VANCOTROUGH  --   --   --   --   --   --  20    Estimated Creatinine Clearance: 57.6 mL/min (by C-G formula based on SCr of 0.98 mg/dL).    Allergies  Allergen Reactions  . Glimepiride   . Tape Itching and Rash    Antimicrobials this admission: Zosyn 8/2 >> 8/3 vancomycin 8/2 >> 8/6 Acyclovir 8/3 >> 8/8 Ampicillin/amoxicillin 8/3 >> 8/11 clarithormycin 8/6 >> 8/16 Ceftriaxone 8/3 >> 8/5 Unasyn 8/14 >> 8/16 Vancomycin 8/15 >>  Dose adjustments this admission:   Microbiology results: 8/5 BCx: NG 8/2 BCx: NG 8/2 UCx: insignificant growth  8/5 CSF: NG 8/3 MRSA PCR: negative 8/13 UCx: NG 8/13 BCx: NGTD 8/13 TA: MRSA 8/14 TA: MRSA    Thank you for allowing pharmacy to be a part of this patient's care.  Olivia Canter, [facility] 12/14/2017 10:37 AM

## 2017-12-14 NOTE — Progress Notes (Signed)
Bronchoscopy performed with MD, RN and additional RT's at bedside. Pt placed on 100% FiO2 during entire procedure, multiple tenacious mucous plugs aspirated by MD pt tolerated procedure well. Respiratory culture sent to lab. Pt placed back on resting setting with FiO2 at 40%. Will continue to monitor

## 2017-12-14 NOTE — Progress Notes (Signed)
Patient ID: Marc [name], male   DOB: 1941-04-29, 77 y.o.   MRN: [id number] Pulmonary/critical care  This morning's chest x-Marlow revealed complete opacification of the left hemithorax Bronchial cut off consistent with mucous plug. Discussed with wife. Proceed with bronchoscopy. Patient has been agitated and has been given Precedex, fentanyl, as needed benzodiazepine and one-time dose of vecuronium. Bronchoscope inserted through an adapter through tracheostomies which terminates several centimeters above the carina. The left bronchus is completely occluded with a thick mucous plug. The right lung was cleared of secretions. Brief inspection of the right upper/right middle/right lower lobe.With frequent lavages and suctioning I was eventually able to clear the large mucous plug occluding the entire left mainstem bronchus. Upon completion of the procedure the left mainstem was cleared. Will obtain chest x-Jerzy and an hour to see if left lung reexpand's Will send for respiratory culture.  Marc [name], D.O.

## 2017-12-14 NOTE — Progress Notes (Signed)
ANTICOAGULATION CONSULT NOTE  Pharmacy Consult for heparin drip management  Indication: DVT  Patient Measurements: Height: 5\' 9"  (175.3 cm) Weight: 137 lb 12.6 oz (62.5 kg) IBW/kg (Calculated) : 70.7   Vital Signs: Temp: 98.4 F (36.9 C) (08/17 0400) Temp Source: Oral (08/17 0400) BP: 106/62 (08/17 0500) Pulse Rate: 92 (08/17 0500)  Labs: Recent Labs    12/12/17 0424 12/12/17 1224 12/13/17 0412 12/13/17 2242 12/14/17 0446 12/14/17 0807  HGB 9.3*  --  9.1*  --  8.6*  --   HCT 26.7*  --  26.2*  --  24.1*  --   PLT 157  --  184  --  171  --   HEPARINUNFRC 0.31 0.25*  --  0.32  --  0.31  CREATININE 1.02  --  1.00  --  0.98  --   TROPONINI 0.51*  --   --   --   --   --     Estimated Creatinine Clearance: 57.6 mL/min (by C-G formula based on SCr of 0.98 mg/dL).  Medical History: Past Medical History:  Diagnosis Date  . Anxiety   . BPH (benign prostatic hyperplasia)   . Colon adenomas    history of  . Diverticulosis   . Hyperlipidemia   . Hypertension    Pharmacy consulted for heparin drip management for 77 yo male admitted with AMS and being treated for DVT of right upper extremety. Patient remains sedated and requiring mechanical ventilation.  Goal of Therapy:  Heparin level 0.3-0.7 units/ml Monitor platelets by anticoagulation protocol: Yes    Assessment/Plan:  8/17 @ 0807  HL 0.31  Will continue current rate @ 1200 units/hr. Recheck HL with am labs.   Olivia Canter [facility] Clinical Pharmacist 12/14/2017 10:29 AM

## 2017-12-14 NOTE — Progress Notes (Signed)
CRITICAL CARE NOTE  CC  follow up respiratory failure with generalized weakness  SUBJECTIVE Patient remains critically ill Prognosis is guarded  77 year old male with acute encephalopathy, sepsis, pneumonia, and dysphagia/dysphonia who was admitted on 8/2. Patient remains intubated and sedated. Consults placed for tracheostomy and peg tube. Tracheostomy scheduled for tomorrow morning.    SIGNIFICANT EVENTS  No significant events last 24 hours. Patient going for tracheostomy this morning  BP 106/62   Pulse 92   Temp 98.4 F (36.9 C) (Oral)   Resp 15   Ht 5\' 9"  (1.753 m)   Wt 62.5 kg   SpO2 99%   BMI 20.35 kg/m    REVIEW OF SYSTEMS  PATIENT IS UNABLE TO PROVIDE COMPLETE REVIEW OF SYSTEM S DUE TO SEVERE CRITICAL ILLNESS AND ENCEPHALOPATHY   PHYSICAL EXAMINATION:  GENERAL:critically ill appearing, +resp distress HEAD: Normocephalic, atraumatic.  EYES: Pupils equal, round, reactive to light.  No scleral icterus.  MOUTH: Moist mucosal membrane. NECK: Supple. No thyromegaly. No nodules. No JVD.  PULMONARY: Rhonchi bilaterally, no wheezes/rales.  CARDIOVASCULAR: S1 and S2. Regular rate and rhythm. No murmurs, rubs, or gallops. 2+ dorsalis pedis pulses bilaterally GASTROINTESTINAL: Soft, nontender, -distended. No masses. Positive bowel sounds. No hepatosplenomegaly.  MUSCULOSKELETAL: No swelling, clubbing, or edema.  NEUROLOGIC: obtunded, GCS<8 SKIN:intact,warm,dry  ASSESSMENT AND PLAN SYNOPSIS   77 year old male admitted on 8/2 for generalized weakness and acute encephalopathy. Patient status post tracheostomy. Did well overnight. Still agitated requiring Precedex, fentanyl with as needed benzodiazepine  Severe Hypoxic Respiratory Failure secondary to aspiration pneumonia. Completed a course of ABX, pending am CXR.   Prerenal azotemia. Improving  Hypokalemia. On replacement  Altered mental status/failure to thrive. Appreciate neurology's assistance. Will follow all  recommendations. Patient has had extensive workup to include MRI of the lumbar and thoracic spine, CT scan of the head, serologic studies for myopathy/neuropathy   H. Pylori. Compoeting treatment with clarithromycin, amoxicillin and Protonix  Hypotension. Patient weaned off of pressors  DVT and SVT of right upper arm. Continue anticoagulation on hold per tracheostomy   Critical Care Time devoted to patient care services described in this note is 35 minutes.   Pending transfer to [place], DO  Patient ID: Marc [name], male   DOB: 09/26/1940, 77 y.o.   MRN: [id number] Patient ID: Marc [name], male   DOB: 11-16-1940, 77 y.o.   MRN: [id number]

## 2017-12-14 NOTE — Progress Notes (Signed)
[facility] [place] NAME: Verdon Ferrante    MR#:  [id number]  DATE OF BIRTH:  07-28-1940  SUBJECTIVE:  CHIEF COMPLAINT:   Chief Complaint  Patient presents with  . Weakness  . Dizziness    -Status post tracheostomy placement yesterday.  X-[place] with opacification on the left side this morning.  Increased thick secretions around the trach site noted.  REVIEW OF SYSTEMS:  Review of Systems  Unable to perform ROS: Critical illness    DRUG ALLERGIES:   Allergies  Allergen Reactions  . Glimepiride   . Tape Itching and Rash    VITALS:  Blood pressure 106/62, pulse 92, temperature 98.4 F (36.9 C), temperature source Oral, resp. rate 15, height 5\' 9"  (1.753 m), weight 62.5 kg, SpO2 91 %.  PHYSICAL EXAMINATION:  Physical Exam   GENERAL:  77 y.o.-year-old patient lying in the bed, ill-appearing,  EYES: Pupils equal, round, reactive to light and accommodation. No scleral icterus. Extraocular muscles intact.  HEENT: Head atraumatic, normocephalic. Oropharynx and nasopharynx clear.  Orally intubated NECK:  Supple, no jugular venous distention. No thyroid enlargement, no tenderness.  Tracheostomy in place LUNGS: Normal breath sounds bilaterally, no wheezing, rales  or crepitation. No use of accessory muscles of respiration.  Bibasilar rhonchi heard. CARDIOVASCULAR: S1, S2 normal. No  rubs, or gallops.  2/6 systolic murmur is present ABDOMEN: Soft, nontender, nondistended. Bowel sounds present. No organomegaly or mass.  EXTREMITIES: No pedal edema, cyanosis, or clubbing.  NEUROLOGIC: sedated, able to move all extremities in bed when sedation is decreased PSYCHIATRIC: The patient is sedated SKIN: No obvious rash, lesion, or ulcer.    LABORATORY PANEL:   CBC Recent Labs  Lab 12/14/17 0446  WBC 5.3  HGB 8.6*  HCT 24.1*  PLT 171    ------------------------------------------------------------------------------------------------------------------  Chemistries  Recent Labs  Lab 12/13/17 0412 12/14/17 0446  NA 144 142  K 3.7 3.4*  CL 103 103  CO2 33* 34*  GLUCOSE 130* 155*  BUN 30* 26*  CREATININE 1.00 0.98  CALCIUM 7.4* 7.3*  MG 1.7 1.8  AST 31  --   ALT 64*  --   ALKPHOS 115  --   BILITOT 1.1  --    ------------------------------------------------------------------------------------------------------------------  Cardiac Enzymes Recent Labs  Lab 12/12/17 0424  TROPONINI 0.51*   ------------------------------------------------------------------------------------------------------------------  RADIOLOGY:  Dg Chest Port 1 View  Result Date: 12/14/2017 CLINICAL DATA:  Tracheostomy, ventilatory support EXAM: PORTABLE CHEST 1 VIEW COMPARISON:  12/12/2017 FINDINGS: Interval tracheostomy in the upper trachea 5.7 cm above the carina. NG tube enters the stomach. Enlarging left pleural effusion with increased left lung collapse/consolidation. There is near complete opacification of the left hemithorax. Stable right lung aeration. No right effusion. No large pneumothorax. No significant osseous finding. IMPRESSION: Enlarging left pleural effusion with near complete left hemithorax opacification. Electronically Signed   By: Jerilynn Mages.  Shick M.D.   On: 12/14/2017 09:38    EKG:   Orders placed or performed during the hospital encounter of 11/29/17  . ED EKG  . ED EKG  . EKG 12-Lead  . EKG 12-Lead  . EKG 12-Lead  . EKG 12-Lead  . EKG 12-Lead  . EKG 12-Lead  . EKG 12-Lead  . EKG 12-Lead  . EKG 12-Lead  . EKG 12-Lead  . EKG 12-Lead  . EKG 12-Lead  . EKG 12-Lead  . EKG 12-Lead  . EKG 12-Lead  . EKG 12-Lead    ASSESSMENT AND PLAN:  77 year old male with past medical history significant for hypertension, hyperlipidemia and recent diagnosis of H. pylori gastritis on 11/27/2017 presents to hospital from home  secondary to weakness, confusion.  1.  Acute respiratory failure- Initially intubated for possible aspiration, extubated however reintubated on 12/09/2017. -Chest x-[place] today with left-sided opacification and increased mucus plugging.  Status post bronchoscopy and improvement in ventilation. -Vent management per intensivist. S/p Trach, will need PEG -on Unasyn  2.  Acute encephalopathy-appreciate neurology consult.  Initially considered to be secondary to PRES syndrome -MRI of the brain showing mild volume loss and chronic ischemic changes, no acute findings. -Lumbar puncture was negative -Patient still encephalopathic.  MRI of the lumbar spine and thoracic spine ordered for lower extremity weakness and repeat CT of the head -Also myasthenia gravis labs, labs for para-neoplastic syndrome ordered -  if no improvement recommend repeat MRI brain  3.  H. pylori gastritis-diagnosed with biopsy from recent endoscopy about 2 weeks ago. -Continue treatment with Protonix, amoxicillin and clarithromycin  4.  Hypertension-labile blood pressures. -Continue metoprolol and Norvasc for now and monitor  5.  Right upper extremity DVT-has a PICC line currently in that arm. -Nonocclusive DVT in the right subclavian and an occlusive DVT in the right cephalic vein seen. -on IV heparin drip for now   Critically ill, guarded prognosis LTAC when clinically stable   All the records are reviewed and case discussed with Care Management/Social Workerr. Management plans discussed with the patient, family and they are in agreement.  CODE STATUS: Full Code  TOTAL TIME TAKING CARE OF THIS PATIENT: 28 minutes.   POSSIBLE D/C IN ? DAYS, DEPENDING ON CLINICAL CONDITION.   Gladstone Lighter M.D on 12/14/2017 at 10:28 AM  Between 7am to 6pm - Pager - [phone]  After 6pm go to [email or web address] - password [email or web address]  Office  [phone]  CC: Primary care physician; Tracie Harrier, MD

## 2017-12-15 ENCOUNTER — Inpatient Hospital Stay: Payer: PPO

## 2017-12-15 LAB — BASIC METABOLIC PANEL
ANION GAP: 6 (ref 5–15)
Anion gap: 5 (ref 5–15)
BUN: 26 mg/dL — ABNORMAL HIGH (ref 8–23)
BUN: 27 mg/dL — AB (ref 8–23)
CALCIUM: 7.9 mg/dL — AB (ref 8.9–10.3)
CHLORIDE: 105 mmol/L (ref 98–111)
CHLORIDE: 106 mmol/L (ref 98–111)
CO2: 32 mmol/L (ref 22–32)
CO2: 34 mmol/L — ABNORMAL HIGH (ref 22–32)
CREATININE: 0.92 mg/dL (ref 0.61–1.24)
Calcium: 8.5 mg/dL — ABNORMAL LOW (ref 8.9–10.3)
Creatinine, Ser: 0.91 mg/dL (ref 0.61–1.24)
GFR calc non Af Amer: >60 mL/min (ref >60)
Glucose, Bld: 134 mg/dL — ABNORMAL HIGH (ref 70–99)
Glucose, Bld: 73 mg/dL (ref 70–99)
POTASSIUM: 4.5 mmol/L (ref 3.5–5.1)
Potassium: 4.5 mmol/L (ref 3.5–5.1)
SODIUM: 143 mmol/L (ref 135–145)
Sodium: 145 mmol/L (ref 135–145)

## 2017-12-15 LAB — CULTURE, BLOOD (ROUTINE X 2)
CULTURE: NO GROWTH
Culture: NO GROWTH
SPECIAL REQUESTS: ADEQUATE
Special Requests: ADEQUATE

## 2017-12-15 LAB — CBC
HEMATOCRIT: 28.4 % — AB (ref 40.0–52.0)
HEMOGLOBIN: 9.8 g/dL — AB (ref 13.0–18.0)
MCH: 34.1 pg — AB (ref 26.0–34.0)
MCHC: 34.6 g/dL (ref 32.0–36.0)
MCV: 98.5 fL (ref 80.0–100.0)
Platelets: 222 10*3/uL (ref 150–440)
RBC: 2.88 MIL/uL — ABNORMAL LOW (ref 4.40–5.90)
RDW: 14.1 % (ref 11.5–14.5)
WBC: 7.3 10*3/uL (ref 3.8–10.6)

## 2017-12-15 LAB — PHOSPHORUS: PHOSPHORUS: 3.8 mg/dL (ref 2.5–4.6)

## 2017-12-15 LAB — GLUCOSE, CAPILLARY
GLUCOSE-CAPILLARY: 98 mg/dL (ref 70–99)
GLUCOSE-CAPILLARY: 114 mg/dL — AB (ref 70–99)
Glucose-Capillary: 80 mg/dL (ref 70–99)
Glucose-Capillary: 59 mg/dL — ABNORMAL LOW (ref 70–99)
Glucose-Capillary: 81 mg/dL (ref 70–99)
Glucose-Capillary: 79 mg/dL (ref 70–99)

## 2017-12-15 LAB — MAGNESIUM: MAGNESIUM: 2 mg/dL (ref 1.7–2.4)

## 2017-12-15 LAB — HEPARIN LEVEL (UNFRACTIONATED): HEPARIN UNFRACTIONATED: 0.38 [IU]/mL (ref 0.30–0.70)

## 2017-12-15 MED ORDER — ALPRAZOLAM 0.5 MG PO TABS
0.5000 mg | ORAL_TABLET | Freq: Three times a day (TID) | ORAL | Status: DC
  Administered 2017-12-15 – 2017-12-17 (×7): 0.5 mg via ORAL
  Filled 2017-12-15 (×7): qty 1

## 2017-12-15 NOTE — Progress Notes (Signed)
[facility] at [place] NAME: Marc [name]    MR#:  [id number]  DATE OF BIRTH:  Dec 28, 1940  SUBJECTIVE:  CHIEF COMPLAINT:   Chief Complaint  Patient presents with  . Weakness  . Dizziness    -Status post bronchoscopy yesterday with improvement in ventilation. -Sedation requirement increased, resting peacefully. - awaiting transfer to [place]:  Review of Systems  Unable to perform ROS: Critical illness    DRUG ALLERGIES:   Allergies  Allergen Reactions  . Glimepiride   . Tape Itching and Rash    VITALS:  Blood pressure 136/71, pulse 81, temperature 98.4 F (36.9 C), temperature source Axillary, resp. rate 15, height 5\' 9"  (1.753 m), weight 62.5 kg, SpO2 100 %.  PHYSICAL EXAMINATION:  Physical Exam   GENERAL:  77 y.o.-year-old patient lying in the bed, ill-appearing,  EYES: Pupils equal, round, reactive to light and accommodation. No scleral icterus. Extraocular muscles intact.  HEENT: Head atraumatic, normocephalic. Oropharynx and nasopharynx clear.  Orally intubated NECK:  Supple, no jugular venous distention. No thyroid enlargement, no tenderness.  Tracheostomy in place LUNGS: Normal breath sounds bilaterally, no wheezing, rales  or crepitation. No use of accessory muscles of respiration.  Bibasilar rhonchi heard. CARDIOVASCULAR: S1, S2 normal. No  rubs, or gallops.  2/6 systolic murmur is present ABDOMEN: Soft, nontender, nondistended. Bowel sounds present. No organomegaly or mass.  EXTREMITIES: No pedal edema, cyanosis, or clubbing.  NEUROLOGIC: sedated, able to move all extremities in bed when sedation is decreased PSYCHIATRIC: The patient is sedated SKIN: No obvious rash, lesion, or ulcer.    LABORATORY PANEL:   CBC Recent Labs  Lab 12/15/17 0440  WBC 7.3  HGB 9.8*  HCT 28.4*  PLT 222    ------------------------------------------------------------------------------------------------------------------  Chemistries  Recent Labs  Lab 12/13/17 0412  12/15/17 0440  NA 144   < > 145  K 3.7   < > 4.5  CL 103   < > 105  CO2 33*   < > 34*  GLUCOSE 130*   < > 73  BUN 30*   < > 26*  CREATININE 1.00   < > 0.91  CALCIUM 7.4*   < > 8.5*  MG 1.7   < > 2.0  AST 31  --   --   ALT 64*  --   --   ALKPHOS 115  --   --   BILITOT 1.1  --   --    < > = values in this interval not displayed.   ------------------------------------------------------------------------------------------------------------------  Cardiac Enzymes Recent Labs  Lab 12/12/17 0424  TROPONINI 0.51*   ------------------------------------------------------------------------------------------------------------------  RADIOLOGY:  Dg Chest Port 1 View  Result Date: 12/14/2017 CLINICAL DATA:  Status post bronchoscopy. EXAM: PORTABLE CHEST 1 VIEW COMPARISON:  12/14/2017 and prior radiographs FINDINGS: Decreased LEFT pleural effusion with improved LEFT lung aeration. Persistent LEFT LOWER lung consolidation/atelectasis noted. Cardiomediastinal silhouette is otherwise unchanged. A tracheostomy tube, RIGHT PICC line with tip overlying the SUPERIOR cavoatrial junction and NG tube entering the stomach again noted. Pulmonary vascular congestion is again identified. No pneumothorax. IMPRESSION: Decreased LEFT pleural effusion with improved LEFT lung aeration. No pneumothorax. Continued pulmonary vascular congestion and LEFT LOWER lung consolidation/atelectasis . Electronically Signed   By: Margarette Canada M.D.   On: 12/14/2017 11:27   Dg Chest Port 1 View  Result Date: 12/14/2017 CLINICAL DATA:  Tracheostomy, ventilatory support EXAM: PORTABLE CHEST 1 VIEW COMPARISON:  12/12/2017 FINDINGS: Interval  tracheostomy in the upper trachea 5.7 cm above the carina. NG tube enters the stomach. Enlarging left pleural effusion with  increased left lung collapse/consolidation. There is near complete opacification of the left hemithorax. Stable right lung aeration. No right effusion. No large pneumothorax. No significant osseous finding. IMPRESSION: Enlarging left pleural effusion with near complete left hemithorax opacification. Electronically Signed   By: Jerilynn Mages.  Shick M.D.   On: 12/14/2017 09:38    EKG:   Orders placed or performed during the hospital encounter of 11/29/17  . ED EKG  . ED EKG  . EKG 12-Lead  . EKG 12-Lead  . EKG 12-Lead  . EKG 12-Lead  . EKG 12-Lead  . EKG 12-Lead  . EKG 12-Lead  . EKG 12-Lead  . EKG 12-Lead  . EKG 12-Lead  . EKG 12-Lead  . EKG 12-Lead  . EKG 12-Lead  . EKG 12-Lead  . EKG 12-Lead  . EKG 12-Lead    ASSESSMENT AND PLAN:   77 year old male with past medical history significant for hypertension, hyperlipidemia and recent diagnosis of H. pylori gastritis on 11/27/2017 presents to hospital from home secondary to weakness, confusion.  1.  Acute respiratory failure- Initially intubated for possible aspiration, extubated however reintubated on 12/09/2017. -Chest x-[place] today with left-sided opacification and increased mucus plugging.  Status post bronchoscopy and improvement in ventilation. -Vent management per intensivist. S/p Trach, will need PEG -Antibiotics changed to vancomycin due to MRSA in sputum  2.  Acute encephalopathy-appreciate neurology consult.  Initially considered to be secondary to PRES syndrome -MRI of the brain showing mild volume loss and chronic ischemic changes, no acute findings. -Lumbar puncture was negative -Patient still encephalopathic.  MRI of the lumbar spine and thoracic spine ordered for lower extremity weakness and repeat CT of the head -Also myasthenia gravis labs, labs for para-neoplastic syndrome ordered -  if no improvement recommend repeat MRI brain  3.  H. pylori gastritis-diagnosed with biopsy from recent endoscopy about 2 weeks ago. -Continue  treatment with Protonix, amoxicillin and clarithromycin  4.  Hypertension-labile blood pressures. -Continue metoprolol and Norvasc for now and monitor  5.  Right upper extremity DVT-has a PICC line currently in that arm. -Nonocclusive DVT in the right subclavian and an occlusive DVT in the right cephalic vein seen. -on IV heparin drip for now   Critically ill, guarded prognosis LTAC when clinically stable   All the records are reviewed and case discussed with Care Management/Social Workerr. Management plans discussed with the patient, family and they are in agreement.  CODE STATUS: Full Code  TOTAL TIME TAKING CARE OF THIS PATIENT: 28 minutes.   POSSIBLE D/C IN ? DAYS, DEPENDING ON CLINICAL CONDITION.   Gladstone Lighter M.D on 12/15/2017 at 9:51 AM  Between 7am to 6pm - Pager - [phone]  After 6pm go to [email or web address] - password [email or web address]  Office  [phone]  CC: Primary care physician; Tracie Harrier, MD

## 2017-12-15 NOTE — Progress Notes (Signed)
[place] for heparin drip management  Indication: DVT  Patient Measurements: Height: 5\' 9"  (175.3 cm) Weight: 137 lb 12.6 oz (62.5 kg) IBW/kg (Calculated) : 70.7   Vital Signs: Temp: 98.5 F (36.9 C) (08/18 0400) Temp Source: Axillary (08/18 0400) BP: 136/71 (08/18 0400) Pulse Rate: 96 (08/18 0600)  Labs: Recent Labs    12/13/17 0412 12/13/17 2242 12/14/17 0446 12/14/17 0807 12/14/17 2344 12/15/17 0440  HGB 9.1*  --  8.6*  --   --  9.8*  HCT 26.2*  --  24.1*  --   --  28.4*  PLT 184  --  171  --   --  222  HEPARINUNFRC  --  0.32  --  0.31  --  0.38  CREATININE 1.00  --  0.98  --  0.92 0.91    Estimated Creatinine Clearance: 62 mL/min (by C-G formula based on SCr of 0.91 mg/dL).  Medical History: Past Medical History:  Diagnosis Date  . Anxiety   . BPH (benign prostatic hyperplasia)   . Colon adenomas    history of  . Diverticulosis   . Hyperlipidemia   . Hypertension    Pharmacy consulted for heparin drip management for 77 yo male admitted with AMS and being treated for DVT of right upper extremety. Patient remains sedated and requiring mechanical ventilation.  Goal of Therapy:  Heparin level 0.3-0.7 units/ml Monitor platelets by anticoagulation protocol: Yes    Assessment/Plan:  08/18 @ 0500 HL 0.38 therapeutic. Will continue rate at 1200 units/hr and will recheck HL w/ am labs. CBC stable.  Tobie Lords, PharmD, BCPS Clinical Pharmacist 12/15/2017

## 2017-12-15 NOTE — Progress Notes (Signed)
CRITICAL CARE NOTE  SUBJECTIVE Patient remains critically ill Prognosis is guarded  77 year old male with acute encephalopathy, sepsis, pneumonia, and dysphagia/dysphonia who was admitted on 8/2. Patient remains intubated and sedated. Consults placed for tracheostomy and peg tube. Tracheostomy scheduled for tomorrow morning.    SIGNIFICANT EVENTS  Yesterday patient had a mucous plug occluding his left mainstem bronchus, status post bronchoscopy which revealed partial reexpansion of left lung pending morning chest x-Duff. Otherwise had said some difficulty with sedation. Presently on fentanyl, Precedex with as needed benzodiazepines.  BP 136/71 (BP Location: Left Arm)   Pulse 81   Temp 98.4 F (36.9 C) (Axillary)   Resp 15   Ht 5\' 9"  (1.753 m)   Wt 62.5 kg   SpO2 99%   BMI 20.35 kg/m    REVIEW OF SYSTEMS  PATIENT IS UNABLE TO PROVIDE COMPLETE REVIEW OF SYSTEM S DUE TO SEVERE CRITICAL ILLNESS AND ENCEPHALOPATHY   PHYSICAL EXAMINATION:  GENERAL:critically ill appearing, +resp distress HEAD: Normocephalic, atraumatic.  EYES: Pupils equal, round, reactive to light.  No scleral icterus.  MOUTH: Moist mucosal membrane. NECK: Supple. No thyromegaly. No nodules. No JVD.  PULMONARY: Rhonchi bilaterally, no wheezes/rales.  CARDIOVASCULAR: S1 and S2. Regular rate and rhythm. No murmurs, rubs, or gallops. 2+ dorsalis pedis pulses bilaterally GASTROINTESTINAL: Soft, nontender, -distended. No masses. Positive bowel sounds. No hepatosplenomegaly.  MUSCULOSKELETAL: No swelling, clubbing, or edema.  NEUROLOGIC: obtunded, GCS<8 SKIN:intact,warm,dry  ASSESSMENT AND PLAN SYNOPSIS   77 year old male admitted on 8/2 for generalized weakness and acute encephalopathy. Patient status post tracheostomy. Still agitated requiring Precedex, fentanyl with as needed benzodiazepine  Severe Hypoxic Respiratory Failure secondary to aspiration pneumonia. Occluded left mainstem bronchus yesterday's,  status post bronchoscopy, pending morning chest x-Ka  Prerenal azotemia. Improving  Altered mental status/failure to thrive. Appreciate neurology's assistance. Will follow all recommendations. Patient has had extensive workup to include MRI of the lumbar and thoracic spine, CT scan of the head, serologic studies for myopathy/neuropathy  H. Pylori. Compoeting treatment with clarithromycin, amoxicillin and Protonix  Hypotension. Patient weaned off of pressors  DVT and SVT of right upper arm. Continue anticoagulation on hold per tracheostomy   Critical Care Time devoted to patient care services described in this note is 30 minutes.   Pending transfer to [place], DO  Patient ID: Marc [name], male   DOB: 09-05-1940, 77 y.o.   MRN: [id number] Patient ID: Marc [name], male   DOB: [birth year], 77 y.o.   MRN: [id number]  Patient ID: Marc [name], male   DOB: 1941/02/19, 77 y.o.   MRN: [id number]

## 2017-12-16 ENCOUNTER — Inpatient Hospital Stay: Payer: PPO

## 2017-12-16 DIAGNOSIS — R1310 Dysphagia, unspecified: Secondary | ICD-10-CM

## 2017-12-16 LAB — BASIC METABOLIC PANEL
Anion gap: 7 (ref 5–15)
BUN: 27 mg/dL — AB (ref 8–23)
CALCIUM: 8 mg/dL — AB (ref 8.9–10.3)
CO2: 31 mmol/L (ref 22–32)
CREATININE: 0.88 mg/dL (ref 0.61–1.24)
Chloride: 104 mmol/L (ref 98–111)
GFR calc Af Amer: >60 mL/min (ref >60)
Glucose, Bld: 109 mg/dL — ABNORMAL HIGH (ref 70–99)
Potassium: 3.9 mmol/L (ref 3.5–5.1)
Sodium: 142 mmol/L (ref 135–145)

## 2017-12-16 LAB — GLUCOSE, CAPILLARY
GLUCOSE-CAPILLARY: 108 mg/dL — AB (ref 70–99)
GLUCOSE-CAPILLARY: 122 mg/dL — AB (ref 70–99)
GLUCOSE-CAPILLARY: 163 mg/dL — AB (ref 70–99)
GLUCOSE-CAPILLARY: 95 mg/dL (ref 70–99)
Glucose-Capillary: 104 mg/dL — ABNORMAL HIGH (ref 70–99)
Glucose-Capillary: 121 mg/dL — ABNORMAL HIGH (ref 70–99)
Glucose-Capillary: 84 mg/dL (ref 70–99)

## 2017-12-16 LAB — CBC
HCT: 23.9 % — ABNORMAL LOW (ref 40.0–52.0)
HEMOGLOBIN: 8.2 g/dL — AB (ref 13.0–18.0)
MCH: 33.7 pg (ref 26.0–34.0)
MCHC: 34.3 g/dL (ref 32.0–36.0)
MCV: 98.3 fL (ref 80.0–100.0)
PLATELETS: 221 10*3/uL (ref 150–440)
RBC: 2.43 MIL/uL — AB (ref 4.40–5.90)
RDW: 14.1 % (ref 11.5–14.5)
WBC: 4.7 10*3/uL (ref 3.8–10.6)

## 2017-12-16 LAB — PHOSPHORUS: Phosphorus: 4 mg/dL (ref 2.5–4.6)

## 2017-12-16 LAB — HEPARIN LEVEL (UNFRACTIONATED)
HEPARIN UNFRACTIONATED: 0.3 [IU]/mL (ref 0.30–0.70)
HEPARIN UNFRACTIONATED: 0.26 [IU]/mL — AB (ref 0.30–0.70)
HEPARIN UNFRACTIONATED: 0.47 [IU]/mL (ref 0.30–0.70)

## 2017-12-16 LAB — TRIGLYCERIDES: TRIGLYCERIDES: 103 mg/dL (ref <150)

## 2017-12-16 LAB — MAGNESIUM: Magnesium: 1.8 mg/dL (ref 1.7–2.4)

## 2017-12-16 MED ORDER — QUETIAPINE FUMARATE 25 MG PO TABS
25.0000 mg | ORAL_TABLET | Freq: Two times a day (BID) | ORAL | Status: DC
  Administered 2017-12-16 – 2017-12-17 (×3): 25 mg
  Filled 2017-12-16 (×2): qty 1

## 2017-12-16 MED ORDER — APIXABAN 5 MG PO TABS
5.0000 mg | ORAL_TABLET | Freq: Two times a day (BID) | ORAL | Status: DC
  Filled 2017-12-16: qty 1

## 2017-12-16 MED ORDER — HEPARIN BOLUS VIA INFUSION
1500.0000 [IU] | Freq: Once | INTRAVENOUS | Status: AC
  Administered 2017-12-16: 1500 [IU] via INTRAVENOUS
  Filled 2017-12-16: qty 1500

## 2017-12-16 MED ORDER — APIXABAN 5 MG PO TABS: 5.0000 mg | ORAL_TABLET | Freq: Two times a day (BID) | ORAL | Status: DC

## 2017-12-16 MED ORDER — SODIUM CHLORIDE 0.9 % IV SOLN
100.0000 mg | Freq: Two times a day (BID) | INTRAVENOUS | Status: DC
  Administered 2017-12-16 – 2017-12-18 (×6): 100 mg via INTRAVENOUS
  Filled 2017-12-16 (×8): qty 100

## 2017-12-16 MED ORDER — HEPARIN (PORCINE) IN NACL 100-0.45 UNIT/ML-% IJ SOLN
1400.0000 [IU]/h | INTRAMUSCULAR | Status: DC
  Administered 2017-12-16: 1400 [IU]/h via INTRAVENOUS
  Administered 2017-12-16: 1250 [IU]/h via INTRAVENOUS
  Filled 2017-12-16: qty 250

## 2017-12-16 NOTE — Progress Notes (Signed)
ANTICOAGULATION CONSULT NOTE  Pharmacy Consult for heparin drip management  Indication: DVT  Patient Measurements: Height: 5\' 9"  (175.3 cm) Weight: 137 lb 12.6 oz (62.5 kg) IBW/kg (Calculated) : 70.7   Vital Signs: Temp: 99.2 F (37.3 C) (08/19 2000) Temp Source: Oral (08/19 2000) BP: 127/69 (08/19 2200) Pulse Rate: 184 (08/19 2200)  Labs: Recent Labs    12/14/17 0446  12/14/17 2344 12/15/17 0440 12/16/17 0509 12/16/17 1249 12/16/17 2220  HGB 8.6*  --   --  9.8* 8.2*  --   --   HCT 24.1*  --   --  28.4* 23.9*  --   --   PLT 171  --   --  222 221  --   --   HEPARINUNFRC  --    < >  --  0.38 0.30 0.26* 0.47  CREATININE 0.98  --  0.92 0.91 0.88  --   --    < > = values in this interval not displayed.    Estimated Creatinine Clearance: 64.1 mL/min (by C-G formula based on SCr of 0.88 mg/dL).  Medical History: Past Medical History:  Diagnosis Date  . Anxiety   . BPH (benign prostatic hyperplasia)   . Colon adenomas    history of  . Diverticulosis   . Hyperlipidemia   . Hypertension    Pharmacy consulted for heparin drip management for 77 yo male admitted with AMS and being treated for DVT of right upper extremety. Patient remains sedated and requiring mechanical ventilation.  08/19 @ 0500 HL 0.30 - Therapeutic x 1  Goal of Therapy:  Heparin level 0.3-0.7 units/ml Monitor platelets by anticoagulation protocol: Yes    Assessment/Plan:  08/19 @ 1249 HL subtherapeutic @ 0.26. Will order 1500unit Bolus and increase infusion to 1400 u/hr. Next HL in 8 hours. CBC with AM labs per protocol.   8/19 2230 heparin level 0.47. Continue current regimen. Recheck heparin level and CBC with tomorrow AM labs.  Eloise Harman, PharmD, BCPS Clinical Pharmacist 12/16/2017 11:02 PM

## 2017-12-16 NOTE — Progress Notes (Signed)
[place] for heparin drip management  Indication: DVT  Patient Measurements: Height: 5\' 9"  (175.3 cm) Weight: 137 lb 12.6 oz (62.5 kg) IBW/kg (Calculated) : 70.7   Vital Signs: Temp: 98.5 F (36.9 C) (08/19 0800) Temp Source: Axillary (08/19 0800) BP: 99/59 (08/19 1250) Pulse Rate: 65 (08/19 1000)  Labs: Recent Labs    12/14/17 0446  12/14/17 2344 12/15/17 0440 12/16/17 0509 12/16/17 1249  HGB 8.6*  --   --  9.8* 8.2*  --   HCT 24.1*  --   --  28.4* 23.9*  --   PLT 171  --   --  222 221  --   HEPARINUNFRC  --    < >  --  0.38 0.30 0.26*  CREATININE 0.98  --  0.92 0.91 0.88  --    < > = values in this interval not displayed.    Estimated Creatinine Clearance: 64.1 mL/min (by C-G formula based on SCr of 0.88 mg/dL).  Medical History: Past Medical History:  Diagnosis Date  . Anxiety   . BPH (benign prostatic hyperplasia)   . Colon adenomas    history of  . Diverticulosis   . Hyperlipidemia   . Hypertension    Pharmacy consulted for heparin drip management for 77 yo male admitted with AMS and being treated for DVT of right upper extremety. Patient remains sedated and requiring mechanical ventilation.  08/19 @ 0500 HL 0.30 - Therapeutic x 1  Goal of Therapy:  Heparin level 0.3-0.7 units/ml Monitor platelets by anticoagulation protocol: Yes    Assessment/Plan:  08/19 @ 1249 HL subtherapeutic @ 0.26. Will order 1500unit Bolus and increase infusion to 1400 u/hr. Next HL in 8 hours. CBC with AM labs per protocol.   Pernell Dupre, PharmD, BCPS Clinical Pharmacist 12/16/2017 1:24 PM

## 2017-12-16 NOTE — Consult Note (Deleted)
ANTICOAGULATION CONSULT NOTE - Initial Consult  Pharmacy Consult for Apixaban Indication: DVT  Allergies  Allergen Reactions  . Glimepiride   . Tape Itching and Rash   Patient Measurements: Height: 5\' 9"  (175.3 cm) Weight: 137 lb 12.6 oz (62.5 kg) IBW/kg (Calculated) : 70.7  Vital Signs: Temp: 98.5 F (36.9 C) (08/19 0800) Temp Source: Axillary (08/19 0800) BP: 113/70 (08/19 1000) Pulse Rate: 65 (08/19 1000)  Labs: Recent Labs    12/14/17 0446 12/14/17 0807 12/14/17 2344 12/15/17 0440 12/16/17 0509  HGB 8.6*  --   --  9.8* 8.2*  HCT 24.1*  --   --  28.4* 23.9*  PLT 171  --   --  222 221  HEPARINUNFRC  --  0.31  --  0.38 0.30  CREATININE 0.98  --  0.92 0.91 0.88    Estimated Creatinine Clearance: 64.1 mL/min (by C-G formula based on SCr of 0.88 mg/dL).   Medical History: Past Medical History:  Diagnosis Date  . Anxiety   . BPH (benign prostatic hyperplasia)   . Colon adenomas    history of  . Diverticulosis   . Hyperlipidemia   . Hypertension    Assessment: Pharmacy consulted for apixaban dosing in 77 yo male found to have DVT during hospital stay. Patient had been receiving treatment dose heparin for the past 13 days.      Plan:  Heparin discontinued  Since patient has been treated with treatment dose heparin infusion for past 7 days,the decision was made to start apixaban 5mg  every 12 hours.   Pernell Dupre, PharmD, BCPS Clinical Pharmacist 12/16/2017 12:30 PM

## 2017-12-16 NOTE — Progress Notes (Signed)
[facility] for heparin drip management  Indication: DVT  Patient Measurements: Height: 5\' 9"  (175.3 cm) Weight: 137 lb 12.6 oz (62.5 kg) IBW/kg (Calculated) : 70.7   Vital Signs: Temp: 98.8 F (37.1 C) (08/19 0400) Temp Source: Axillary (08/19 0400) BP: 98/66 (08/19 0508) Pulse Rate: 66 (08/19 0500)  Labs: Recent Labs    12/14/17 0446 12/14/17 0807 12/14/17 2344 12/15/17 0440 12/16/17 0509  HGB 8.6*  --   --  9.8* 8.2*  HCT 24.1*  --   --  28.4* 23.9*  PLT 171  --   --  222 221  HEPARINUNFRC  --  0.31  --  0.38 0.30  CREATININE 0.98  --  0.92 0.91 0.88    Estimated Creatinine Clearance: 64.1 mL/min (by C-G formula based on SCr of 0.88 mg/dL).  Medical History: Past Medical History:  Diagnosis Date  . Anxiety   . BPH (benign prostatic hyperplasia)   . Colon adenomas    history of  . Diverticulosis   . Hyperlipidemia   . Hypertension    Pharmacy consulted for heparin drip management for 77 yo male admitted with AMS and being treated for DVT of right upper extremety. Patient remains sedated and requiring mechanical ventilation.  Goal of Therapy:  Heparin level 0.3-0.7 units/ml Monitor platelets by anticoagulation protocol: Yes    Assessment/Plan:  08/19 @ 0500 HL 0.30 therapeutic, but on lower end and trended down from 0.38. Will increase rate slightly to 1250 units/hr and will recheck HL @ 1300. CBC trending down.  Tobie Lords, PharmD, BCPS Clinical Pharmacist 12/16/2017

## 2017-12-16 NOTE — Progress Notes (Signed)
Pharmacy Electrolyte Monitoring Consult:  Pharmacy consulted to assist in monitoring and replacing electrolytes in this 77 y.o. male admitted on 11/29/2017 with Weakness and Dizziness  Patient received tracheostomy on 8/16. Patient is currently sedated on propofol.  Labs:  Sodium (mmol/L)  Date Value  12/16/2017 142   Potassium (mmol/L)  Date Value  12/16/2017 3.9   Magnesium (mg/dL)  Date Value  12/16/2017 1.8   Phosphorus (mg/dL)  Date Value  12/16/2017 4.0   Calcium (mg/dL)  Date Value  12/16/2017 8.0 (L)   Albumin (g/dL)  Date Value  12/13/2017 2.3 (L)   Corrected Calcium: 8.4  Assessment/Plan: Magnesium sulfate 1g IV x 1. Goal magnesium ~ 2, Goal potassium  ~ 4.   Will recheck electrolytes with am labs.  Pharmacy will continue to monitor and adjust per consult.   Pernell Dupre, PharmD, BCPS Clinical Pharmacist 12/16/2017 7:19 AM

## 2017-12-16 NOTE — Progress Notes (Signed)
Pharmacy Antibiotic Note  Marc [name] is a 77 y.o. male admitted on 11/29/2017 with AMS. Patient was re-intubated subsequent to cardiac arrest 8/13.  Pharmacy has been consulted for Unasyn dosing for aspiration pnemonia. Patient has been on H pylori regimen consisting of clarithromycin, amoxicillin, and pantoprazole, regimen ended 8/16.  Due to MRSA in tracheal aspirate, Unasyn discontinued. Pharmacy now consulted for vancomycin dosing.   Plan: 8/17 Vanc trough = 20 mcg/ml. Continue vancomycin 750mg  IV Q12hr for goal trough of 15-20.   Next Vanc trough ordered for 8/19 @1930     Height: 5\' 9"  (175.3 cm) Weight: 137 lb 12.6 oz (62.5 kg) IBW/kg (Calculated) : 70.7  Temp (24hrs), Avg:98.4 F (36.9 C), Min:97.9 F (36.6 C), Max:98.8 F (37.1 C)  Recent Labs  Lab 12/12/17 0424 12/13/17 0412 12/14/17 0446 12/14/17 0807 12/14/17 2344 12/15/17 0440 12/16/17 0509  WBC 10.6 9.9 5.3  --   --  7.3 4.7  CREATININE 1.02 1.00 0.98  --  0.92 0.91 0.88  VANCOTROUGH  --   --   --  20  --   --   --     Estimated Creatinine Clearance: 64.1 mL/min (by C-G formula based on SCr of 0.88 mg/dL).    Allergies  Allergen Reactions  . Glimepiride   . Tape Itching and Rash    Antimicrobials this admission: Zosyn 8/2 >> 8/3 vancomycin 8/2 >> 8/6 Acyclovir 8/3 >> 8/8 Ampicillin/amoxicillin 8/3 >> 8/11 clarithormycin 8/6 >> 8/16 Ceftriaxone 8/3 >> 8/5 Unasyn 8/14 >> 8/16 Vancomycin 8/15 >>  Dose adjustments this admission:   Microbiology results: 8/5 BCx: NG 8/2 BCx: NG 8/2 UCx: insignificant growth  8/5 CSF: NG 8/3 MRSA PCR: negative 8/13 UCx: NG 8/13 BCx: NGTD 8/13 TA: MRSA 8/14 TA: MRSA    Thank you for allowing pharmacy to be a part of this patient's care.  Pernell Dupre, PharmD, BCPS Clinical Pharmacist 12/16/2017 8:00 AM

## 2017-12-16 NOTE — Progress Notes (Signed)
[facility] [place] NAME: Marc [name]    MR#:  [id number]  DATE OF BIRTH:  1941-01-16  SUBJECTIVE:  CHIEF COMPLAINT:   Chief Complaint  Patient presents with  . Weakness  . Dizziness  Discussion with intensivist-for LTAC REVIEW OF SYSTEMS:  CONSTITUTIONAL: No fever, fatigue or weakness.  EYES: No blurred or double vision.  EARS, NOSE, AND THROAT: No tinnitus or ear pain.  RESPIRATORY: No cough, shortness of breath, wheezing or hemoptysis.  CARDIOVASCULAR: No chest pain, orthopnea, edema.  GASTROINTESTINAL: No nausea, vomiting, diarrhea or abdominal pain.  GENITOURINARY: No dysuria, hematuria.  ENDOCRINE: No polyuria, nocturia,  HEMATOLOGY: No anemia, easy bruising or bleeding SKIN: No rash or lesion. MUSCULOSKELETAL: No joint pain or arthritis.   NEUROLOGIC: No tingling, numbness, weakness.  PSYCHIATRY: No anxiety or depression.   ROS  DRUG ALLERGIES:   Allergies  Allergen Reactions  . Glimepiride   . Tape Itching and Rash    VITALS:  Blood pressure (!) 99/59, pulse 65, temperature 98.5 F (36.9 C), temperature source Axillary, resp. rate 15, height 5\' 9"  (1.753 m), weight 62.5 kg, SpO2 94 %.  PHYSICAL EXAMINATION:  GENERAL:  77 y.o.-year-old patient lying in the bed with no acute distress.  EYES: Pupils equal, round, reactive to light and accommodation. No scleral icterus. Extraocular muscles intact.  HEENT: Head atraumatic, normocephalic. Oropharynx and nasopharynx clear.  NECK:  Supple, no jugular venous distention. No thyroid enlargement, no tenderness.  LUNGS: Normal breath sounds bilaterally, no wheezing, rales,rhonchi or crepitation. No use of accessory muscles of respiration.  CARDIOVASCULAR: S1, S2 normal. No murmurs, rubs, or gallops.  ABDOMEN: Soft, nontender, nondistended. Bowel sounds present. No organomegaly or mass.  EXTREMITIES: No pedal edema, cyanosis, or clubbing.  NEUROLOGIC: Cranial nerves II through XII  are intact. Muscle strength 5/5 in all extremities. Sensation intact. Gait not checked.  PSYCHIATRIC: The patient is alert and oriented x 3.  SKIN: No obvious rash, lesion, or ulcer.   Physical Exam LABORATORY PANEL:   CBC Recent Labs  Lab 12/16/17 0509  WBC 4.7  HGB 8.2*  HCT 23.9*  PLT 221   ------------------------------------------------------------------------------------------------------------------  Chemistries  Recent Labs  Lab 12/13/17 0412  12/16/17 0509  NA 144   < > 142  K 3.7   < > 3.9  CL 103   < > 104  CO2 33*   < > 31  GLUCOSE 130*   < > 109*  BUN 30*   < > 27*  CREATININE 1.00   < > 0.88  CALCIUM 7.4*   < > 8.0*  MG 1.7   < > 1.8  AST 31  --   --   ALT 64*  --   --   ALKPHOS 115  --   --   BILITOT 1.1  --   --    < > = values in this interval not displayed.   ------------------------------------------------------------------------------------------------------------------  Cardiac Enzymes Recent Labs  Lab 12/11/17 0816 12/12/17 0424  TROPONINI 0.79* 0.51*   ------------------------------------------------------------------------------------------------------------------  RADIOLOGY:  Dg Chest Port 1 View  Result Date: 12/16/2017 CLINICAL DATA:  Respiratory failure. EXAM: PORTABLE CHEST 1 VIEW COMPARISON:  12/15/2017 FINDINGS: Stable appearance of tracheostomy. Gastric decompression tube extends below the diaphragm. A right-sided PICC line has retracted into the left brachiocephalic vein and is buckled with the catheter tip near the brachiocephalic venous confluence. The heart size and mediastinal contours are within normal limits. Relatively stable dense consolidation of the  left lower lobe with probable component of a small left pleural effusion. Stable bilateral perihilar airspace opacities likely representing a combination airspace disease and atelectasis. No pneumothorax. The visualized skeletal structures are unremarkable. IMPRESSION: 1.  Change in positioning of right upper extremity PICC line which has retracted into the left brachiocephalic vein in a buckled configuration with the catheter tip near the brachiocephalic venous confluence. 2. Relatively stable left lower lobe consolidation, probable component of left pleural fluid and bilateral perihilar airspace opacities. Electronically Signed   By: Aletta Edouard M.D.   On: 12/16/2017 08:38   Dg Chest Port 1 View  Result Date: 12/15/2017 CLINICAL DATA:  Mechanical ventilation. EXAM: PORTABLE CHEST 1 VIEW COMPARISON:  12/14/2017 FINDINGS: Tracheostomy tube unchanged. Nasogastric tube courses into the region of the stomach and off the film as tip is not visualized. Right-sided PICC line unchanged with tip over the SVC. Patient is rotated to the left. Lungs are hypoinflated with persistent opacification over the left mid to lower lung likely effusion with atelectasis. Slight worsening bilateral perihilar opacification which may be due to edema or infection. Stable cardiomegaly. Remainder of the exam is unchanged. IMPRESSION: Persistent left base opacification likely moderate effusion with atelectasis. Slight worsening bilateral perihilar opacification which may be due to infection or edema. Tubes and lines as described. Electronically Signed   By: Marin Olp M.D.   On: 12/15/2017 11:09    ASSESSMENT AND PLAN:   77 year old male with past medical history significant for hypertension, hyperlipidemia and recent diagnosis of H. pylori gastritis on 11/27/2017 presents to hospital from home secondary to weakness, confusion.  1.  Acute respiratory failure- Initially intubated for possible aspiration, extubated however reintubated on 12/09/2017. -Chest x-[place] today with left-sided opacification and increased mucus plugging.  Status post bronchoscopy and improvement in ventilation. -Vent management per intensivist. S/p Trach, will need PEG -Antibiotics changed to vancomycin due to MRSA in  sputum  2.  Acute encephalopathy-appreciate neurology consult.  Initially considered to be secondary to PRES syndrome -MRI of the brain showing mild volume loss and chronic ischemic changes, no acute findings. -Lumbar puncture was negative -Patient still encephalopathic.  MRI of the lumbar spine and thoracic spine ordered for lower extremity weakness and repeat CT of the head -Also myasthenia gravis labs, labs for para-neoplastic syndrome ordered -  if no improvement recommend repeat MRI brain  3.  H. pylori gastritis-diagnosed with biopsy from recent endoscopy about 2 weeks ago. -Continue treatment with Protonix, amoxicillin and clarithromycin  4.  Hypertension-labile blood pressures. -Continue metoprolol and Norvasc for now and monitor  5.  Right upper extremity DVT-has a PICC line currently in that arm. -Nonocclusive DVT in the right subclavian and an occlusive DVT in the right cephalic vein seen. -on IV heparin drip for now   Critically ill, guarded prognosis LTAC when clinically stable  All the records are reviewed and case discussed with Care Management/Social Workerr. Management plans discussed with the patient, family and they are in agreement.  CODE STATUS: full  TOTAL TIME TAKING CARE OF THIS PATIENT: 35 minutes.     POSSIBLE D/C IN 2-5 DAYS, DEPENDING ON CLINICAL CONDITION.   Avel Peace Salary M.D on 12/16/2017   Between 7am to 6pm - Pager - [phone]  After 6pm go to [email or web address] - password [email or web address] [facility]  Office  [phone]  CC: Primary care physician; Tracie Harrier, MD  Note: This dictation was prepared with Dragon dictation along with smaller phrase technology. Any transcriptional errors that  result from this process are unintentional.

## 2017-12-16 NOTE — Progress Notes (Signed)
Marc [name] , MD [address], [place], [place], [place], [address] [address], [place], [place], [place], [address] Phone: [phone]  Fax: [phone]   Marc [name] is being followed for G tube    Subjective: Had a tracheostomy on Friday, in ICU delerious. Spoke with wife.    Objective: Vital signs in last 24 hours: Vitals:   12/16/17 0810 12/16/17 0900 12/16/17 1000 12/16/17 1025  BP:  108/73 113/70   Pulse:  65 65   Resp:  15 15   Temp:      TempSrc:      SpO2: 94% 93% 94% 94%  Weight:      Height:       Weight change:   Intake/Output Summary (Last 24 hours) at 12/16/2017 1223 Last data filed at 12/16/2017 1000 Gross per 24 hour  Intake 4242.51 ml  Output 1060 ml  Net 3182.51 ml     Exam: Heart:: Regular rate and rhythm, S1S2 present or without murmur or extra heart sounds Lungs: decreased air entry b/l  Abdomen: soft, nontender, normal bowel sounds   Lab Results: _0 @ Micro Results: Recent Results (from the past 240 hour(s))  Urine Culture     Status: None   Collection Time: 12/10/17 12:42 AM  Result Value Ref Range Status   Specimen Description   Final    URINE, RANDOM Performed at [facility], [address], [place], [place] [address]    Special Requests   Final    NONE Performed at [facility], [address] [place], [place] [address]    Culture   Final    NO GROWTH Performed at [facility], [place] [address] [place], [place] [address]    Report Status 12/11/2017 FINAL  Final  CULTURE, BLOOD (ROUTINE X 2) w Reflex to ID Panel     Status: None   Collection Time: 12/10/17  2:39 AM  Result Value Ref Range Status   Specimen Description BLOOD LEFT [facility]  Final   Special Requests   Final    BOTTLES DRAWN AEROBIC AND ANAEROBIC Blood Culture adequate volume   Culture   Final    NO GROWTH 5 DAYS Performed at [facility], [address] [place], [place] [address]    Report Status 12/15/2017  FINAL  Final  CULTURE, BLOOD (ROUTINE X 2) w Reflex to ID Panel     Status: None   Collection Time: 12/10/17  2:39 AM  Result Value Ref Range Status   Specimen Description BLOOD LEFT HAND  Final   Special Requests   Final    BOTTLES DRAWN AEROBIC AND ANAEROBIC Blood Culture adequate volume   Culture   Final    NO GROWTH 5 DAYS Performed at [facility], [address], [place], [place] [address]    Report Status 12/15/2017 FINAL  Final  Culture, respiratory (non-expectorated)     Status: None   Collection Time: 12/10/17  2:55 AM  Result Value Ref Range Status   Specimen Description   Final    TRACHEAL ASPIRATE Performed at [facility], [address] [place], [place] [address]    Special Requests   Final    NONE Performed at [facility], [address] [place], [place] [address]    Gram Stain   Final    RARE WBC PRESENT,BOTH PMN AND MONONUCLEAR FEW GRAM POSITIVE COCCI IN PAIRS Performed at [facility], [place],  [place] [address]    Culture   Final    MODERATE METHICILLIN RESISTANT STAPHYLOCOCCUS AUREUS   Report Status 12/12/2017 FINAL  Final   Organism ID, Bacteria METHICILLIN RESISTANT STAPHYLOCOCCUS AUREUS  Final      Susceptibility   Methicillin resistant staphylococcus aureus - MIC*    CIPROFLOXACIN >=8 RESISTANT Resistant     ERYTHROMYCIN >=8 RESISTANT Resistant     GENTAMICIN <=0.5 SENSITIVE Sensitive     OXACILLIN >=4 RESISTANT Resistant     TETRACYCLINE <=1 SENSITIVE Sensitive     VANCOMYCIN 1 SENSITIVE Sensitive     TRIMETH/SULFA <=10 SENSITIVE Sensitive     CLINDAMYCIN <=0.25 SENSITIVE Sensitive     RIFAMPIN <=0.5 SENSITIVE Sensitive     Inducible Clindamycin NEGATIVE Sensitive     * MODERATE METHICILLIN RESISTANT STAPHYLOCOCCUS AUREUS  Culture, respiratory (non-expectorated)     Status: None   Collection Time: 12/11/17  9:11 AM  Result Value Ref Range Status   Specimen Description   Final     SPUTUM Performed at [facility], [address] [place], [place] [address]    Special Requests   Final    NONE Performed at [facility], [address] [place], [place] [address]    Gram Stain   Final    MODERATE WBC PRESENT, PREDOMINANTLY PMN FEW GRAM POSITIVE COCCI RARE GRAM NEGATIVE RODS Performed at [facility], [place] [address], [place], [place] [address]    Culture   Final    MODERATE METHICILLIN RESISTANT STAPHYLOCOCCUS AUREUS   Report Status 12/13/2017 FINAL  Final   Organism ID, Bacteria METHICILLIN RESISTANT STAPHYLOCOCCUS AUREUS  Final      Susceptibility   Methicillin resistant staphylococcus aureus - MIC*    CIPROFLOXACIN 4 RESISTANT Resistant     ERYTHROMYCIN >=8 RESISTANT Resistant     GENTAMICIN <=0.5 SENSITIVE Sensitive     OXACILLIN >=4 RESISTANT Resistant     TETRACYCLINE <=1 SENSITIVE Sensitive     VANCOMYCIN <=0.5 SENSITIVE Sensitive     TRIMETH/SULFA <=10 SENSITIVE Sensitive     CLINDAMYCIN <=0.25 SENSITIVE Sensitive     RIFAMPIN <=0.5 SENSITIVE Sensitive     Inducible Clindamycin NEGATIVE Sensitive     * MODERATE METHICILLIN RESISTANT STAPHYLOCOCCUS AUREUS  C difficile quick scan w PCR reflex     Status: Abnormal   Collection Time: 12/11/17  5:42 PM  Result Value Ref Range Status   C Diff antigen POSITIVE (A) NEGATIVE Final   C Diff toxin NEGATIVE NEGATIVE Final   C Diff interpretation Results are indeterminate. See PCR results.  Final    Comment: Performed at [facility], [address] [place], [place] [address]  C. Diff by PCR, Reflexed     Status: None   Collection Time: 12/11/17  5:42 PM  Result Value Ref Range Status   Toxigenic C. Difficile by PCR NEGATIVE NEGATIVE Final    Comment: Patient is colonized with non toxigenic C. difficile. May not need treatment unless significant symptoms are present. Performed at [facility], [address] [place], [place] [address]   Culture,  bal-quantitative     Status: Abnormal (Preliminary result)   Collection Time: 12/14/17 10:00 AM  Result Value Ref Range Status   Specimen Description   Final    BRONCHIAL WASHINGS Performed at [facility], [address] [place], [place] [address]    Special Requests   Final    NONE Performed at [facility], [place],  [place] [address]    Gram Stain   Final    ABUNDANT WBC PRESENT, PREDOMINANTLY PMN NO ORGANISMS SEEN Performed at [facility], [place] [address], [place], [place] [address]    Culture (A)  Final    2,000 COLONIES/mL METHICILLIN RESISTANT STAPHYLOCOCCUS AUREUS   Report Status PENDING  Incomplete   Organism ID, Bacteria METHICILLIN RESISTANT STAPHYLOCOCCUS AUREUS (A)  Final      Susceptibility   Methicillin resistant staphylococcus aureus - MIC*    CIPROFLOXACIN >=8 RESISTANT Resistant     ERYTHROMYCIN >=8 RESISTANT Resistant     GENTAMICIN <=0.5 SENSITIVE Sensitive     OXACILLIN >=4 RESISTANT Resistant     TETRACYCLINE <=1 SENSITIVE Sensitive     VANCOMYCIN <=0.5 SENSITIVE Sensitive     TRIMETH/SULFA <=10 SENSITIVE Sensitive     CLINDAMYCIN <=0.25 SENSITIVE Sensitive     RIFAMPIN <=0.5 SENSITIVE Sensitive     Inducible Clindamycin NEGATIVE Sensitive     * 2,000 COLONIES/mL METHICILLIN RESISTANT STAPHYLOCOCCUS AUREUS   Studies/Results: Dg Chest Port 1 View  Result Date: 12/16/2017 CLINICAL DATA:  Respiratory failure. EXAM: PORTABLE CHEST 1 VIEW COMPARISON:  12/15/2017 FINDINGS: Stable appearance of tracheostomy. Gastric decompression tube extends below the diaphragm. A right-sided PICC line has retracted into the left brachiocephalic vein and is buckled with the catheter tip near the brachiocephalic venous confluence. The heart size and mediastinal contours are within normal limits. Relatively stable dense consolidation of the left lower lobe with probable component of a small left pleural effusion. Stable bilateral perihilar  airspace opacities likely representing a combination airspace disease and atelectasis. No pneumothorax. The visualized skeletal structures are unremarkable. IMPRESSION: 1. Change in positioning of right upper extremity PICC line which has retracted into the left brachiocephalic vein in a buckled configuration with the catheter tip near the brachiocephalic venous confluence. 2. Relatively stable left lower lobe consolidation, probable component of left pleural fluid and bilateral perihilar airspace opacities. Electronically Signed   By: Aletta Edouard M.D.   On: 12/16/2017 08:38   Dg Chest Port 1 View  Result Date: 12/15/2017 CLINICAL DATA:  Mechanical ventilation. EXAM: PORTABLE CHEST 1 VIEW COMPARISON:  12/14/2017 FINDINGS: Tracheostomy tube unchanged. Nasogastric tube courses into the region of the stomach and off the film as tip is not visualized. Right-sided PICC line unchanged with tip over the SVC. Patient is rotated to the left. Lungs are hypoinflated with persistent opacification over the left mid to lower lung likely effusion with atelectasis. Slight worsening bilateral perihilar opacification which may be due to edema or infection. Stable cardiomegaly. Remainder of the exam is unchanged. IMPRESSION: Persistent left base opacification likely moderate effusion with atelectasis. Slight worsening bilateral perihilar opacification which may be due to infection or edema. Tubes and lines as described. Electronically Signed   By: Marin Olp M.D.   On: 12/15/2017 11:09   Medications: I have reviewed the patient's current medications. Scheduled Meds: . ALPRAZolam  0.5 mg Oral Q8H  . amLODipine  10 mg Per Tube Daily  . apixaban  5 mg Per Tube BID  . chlorhexidine gluconate (MEDLINE KIT)  15 mL Mouth Rinse BID  . docusate  100 mg Per Tube BID  . feeding supplement (PRO-STAT SUGAR FREE 64)  30 mL Per Tube Daily  . free water  200 mL Per Tube Q4H  . hydrALAZINE  50 mg Oral Q8H  . insulin aspart  0-15  Units Subcutaneous Q4H  . insulin glargine  10 Units Subcutaneous QHS  . mouth  rinse  15 mL Mouth Rinse 10 times per day  . metoprolol tartrate  50 mg Per Tube BID  . midazolam  2 mg Intravenous Once  . pantoprazole sodium  40 mg Per Tube Daily  . QUEtiapine  25 mg Per Tube BID  . sodium chloride flush  10-40 mL Intracatheter Q12H   Continuous Infusions: . sodium chloride Stopped (12/09/17 2200)  . dexmedetomidine (PRECEDEX) IV infusion 1 mcg/kg/hr (12/16/17 0701)  . doxycycline (VIBRAMYCIN) IV    . feeding supplement (VITAL AF 1.2 CAL) 60 mL/hr at 12/16/17 1000  . fentaNYL infusion INTRAVENOUS 175 mcg/hr (12/16/17 1009)  . phenylephrine (NEO-SYNEPHRINE) Adult infusion Stopped (12/13/17 0916)   PRN Meds:.sodium chloride, acetaminophen **OR** acetaminophen, bisacodyl, bisacodyl, fentaNYL, ipratropium-albuterol, labetalol, lip balm, midazolam, phenol, sennosides, sodium chloride flush   Assessment: Active Problems:   Altered mental status   Protein-calorie malnutrition, severe   Pressure injury of skin   Deckard C Borges 77 y.o. male S/p tracheostomy , respiratory failure for recurrent aspiration. Discussed with wife regarding G tube placement by Dr Alice Reichert which will be tentatively planned for tomorrow. Discussed risks vs benefits including but not limited to perforation, hemorrhage , bleeding and death.  \ Plan: 1. Hold TF at midnight  2. G tube placement by Dr Alice Reichert tentatively for tomorrow    LOS: 40 days   Marc Bellows, MD 12/16/2017, 12:23 PM

## 2017-12-16 NOTE — Clinical Social Work Note (Signed)
Per RN CM documented placed on 8/16, authorization was received for patient to go to [facility] facility: [facility]. Patient to transfer there at discharge. CSW signing off. Shela Leff MSW,LCSW [phone]

## 2017-12-16 NOTE — Care Management (Signed)
LTAC certification expires Thursday 12/19/17. MD requests follow up for LTAC with potential transfer Wednesday due to G-tube pending and patient's agitation. [facility].

## 2017-12-16 NOTE — Progress Notes (Signed)
CRITICAL CARE NOTE  SUBJECTIVE Patient remains critically ill Prognosis is guarded  77 year old male with acute encephalopathy, sepsis, pneumonia, and dysphagia/dysphonia who was admitted on 8/2. Patient remains intubated and sedated.status post tracheostomy, pending PEG tube placement  SIGNIFICANT EVENTS  Yesterday patient had a mucous plug occluding his left mainstem bronchus, status post bronchoscopy which revealed partial reexpansion of left lung pending morning chest x-Marc [name]. Otherwise had said some difficulty with sedation. Presently on fentanyl, Precedex with as needed benzodiazepines.  BP 98/66   Pulse 66   Temp 98.8 F (37.1 C) (Axillary)   Resp 15   Ht 5\' 9"  (1.753 m)   Wt 62.5 kg   SpO2 94%   BMI 20.35 kg/m    PHYSICAL EXAMINATION:  GENERAL:critically ill appearing, Patient appears agitated HEAD: Normocephalic, atraumatic.  EYES: Pupils equal, round, reactive to light.  No scleral icterus.  MOUTH: Moist mucosal membrane. NECK: tracheostomy in place no bleeding noted PULMONARY: rhonchi appreciated on the left CARDIOVASCULAR: S1 and S2. Regular rate and rhythm. No murmurs, rubs, or gallops. 2+ dorsalis pedis pulses bilaterally GASTROINTESTINAL: Soft, nontender, -distended. No masses. Positive bowel sounds. No hepatosplenomegaly.  MUSCULOSKELETAL: No swelling, clubbing, or edema.  NEUROLOGIC: obtunded, GCS<8 SKIN:intact,warm,dry  ASSESSMENT AND PLAN SYNOPSIS   77 year old male admitted on 8/2 for generalized weakness and acute encephalopathy. Patient status post tracheostomy. Still agitated requiring Precedex, fentanyl with as needed benzodiazepine  Severe Hypoxic Respiratory Failure secondary to aspiration pneumonia. Occluded left mainstem bronchus yesterday's, status post bronchoscopy, pending morning chest x-Marc [name]  Prerenal azotemia. Improving  Altered mental status/failure to thrive. Appreciate neurology's assistance. Will follow all recommendations. Patient has  had extensive workup to include MRI of the lumbar and thoracic spine, CT scan of the head, serologic studies for myopathy/neuropathy  H. Pylori. Compoeting treatment with clarithromycin, amoxicillin and Protonix  Hypotension. Patient weaned off of pressors  DVT and SVT of right upper arm. Continue anticoagulation on hold per tracheostomy   Critical Care Time devoted to patient care services described in this note is 30 minutes.   Pending transfer to [place], DO  Patient ID: Marc [name], male   DOB: [birth year], 77 y.o.   MRN: [id number] Patient ID: Marc [name], male   DOB: 12-30-1940, 77 y.o.   MRN: [id number]  Patient ID: Marc [name], male   DOB: 1940/07/27, 77 y.o.   MRN: [id number] Patient ID: Marc [name], male   DOB: 04-08-1941, 77 y.o.   MRN: [id number]

## 2017-12-16 NOTE — H&P (View-Only) (Signed)
[name] , MD [address], [place], [place], [place], [address] [address], [place], [place], [place], [address] Phone: [phone]  Fax: [phone]   BLESS BELSHE is being followed for G tube    Subjective: Had a tracheostomy on Friday, in ICU delerious. Spoke with wife.    Objective: Vital signs in last 24 hours: Vitals:   12/16/17 0810 12/16/17 0900 12/16/17 1000 12/16/17 1025  BP:  108/73 113/70   Pulse:  65 65   Resp:  15 15   Temp:      TempSrc:      SpO2: 94% 93% 94% 94%  Weight:      Height:       Weight change:   Intake/Output Summary (Last 24 hours) at 12/16/2017 1223 Last data filed at 12/16/2017 1000 Gross per 24 hour  Intake 4242.51 ml  Output 1060 ml  Net 3182.51 ml     Exam: Heart:: Regular rate and rhythm, S1S2 present or without murmur or extra heart sounds Lungs: decreased air entry b/l  Abdomen: soft, nontender, normal bowel sounds   Lab Results: _0 @ Micro Results: Recent Results (from the past 240 hour(s))  Urine Culture     Status: None   Collection Time: 12/10/17 12:42 AM  Result Value Ref Range Status   Specimen Description   Final    URINE, RANDOM Performed at [facility], [address] [place], [place] [address]    Special Requests   Final    NONE Performed at [facility], [address] [place], [place] [address]    Culture   Final    NO GROWTH Performed at [facility], [place] [address] [place], [place] [address]    Report Status 12/11/2017 FINAL  Final  CULTURE, BLOOD (ROUTINE X 2) w Reflex to ID Panel     Status: None   Collection Time: 12/10/17  2:39 AM  Result Value Ref Range Status   Specimen Description BLOOD LEFT [facility]  Final   Special Requests   Final    BOTTLES DRAWN AEROBIC AND ANAEROBIC Blood Culture adequate volume   Culture   Final    NO GROWTH 5 DAYS Performed at [facility], [address] [place], [place] [address]    Report Status 12/15/2017  FINAL  Final  CULTURE, BLOOD (ROUTINE X 2) w Reflex to ID Panel     Status: None   Collection Time: 12/10/17  2:39 AM  Result Value Ref Range Status   Specimen Description BLOOD LEFT HAND  Final   Special Requests   Final    BOTTLES DRAWN AEROBIC AND ANAEROBIC Blood Culture adequate volume   Culture   Final    NO GROWTH 5 DAYS Performed at [facility], [address] [place], [place] [address]    Report Status 12/15/2017 FINAL  Final  Culture, respiratory (non-expectorated)     Status: None   Collection Time: 12/10/17  2:55 AM  Result Value Ref Range Status   Specimen Description   Final    TRACHEAL ASPIRATE Performed at [facility], [address] [place], [place] [address]    Special Requests   Final    NONE Performed at [facility], [address] [place], [place] [address]    Gram Stain   Final    RARE WBC PRESENT,BOTH PMN AND MONONUCLEAR FEW GRAM POSITIVE COCCI IN PAIRS Performed at [facility], [place],  [place] [address]    Culture   Final    MODERATE METHICILLIN RESISTANT STAPHYLOCOCCUS AUREUS   Report Status 12/12/2017 FINAL  Final   Organism ID, Bacteria METHICILLIN RESISTANT STAPHYLOCOCCUS AUREUS  Final      Susceptibility   Methicillin resistant staphylococcus aureus - MIC*    CIPROFLOXACIN >=8 RESISTANT Resistant     ERYTHROMYCIN >=8 RESISTANT Resistant     GENTAMICIN <=0.5 SENSITIVE Sensitive     OXACILLIN >=4 RESISTANT Resistant     TETRACYCLINE <=1 SENSITIVE Sensitive     VANCOMYCIN 1 SENSITIVE Sensitive     TRIMETH/SULFA <=10 SENSITIVE Sensitive     CLINDAMYCIN <=0.25 SENSITIVE Sensitive     RIFAMPIN <=0.5 SENSITIVE Sensitive     Inducible Clindamycin NEGATIVE Sensitive     * MODERATE METHICILLIN RESISTANT STAPHYLOCOCCUS AUREUS  Culture, respiratory (non-expectorated)     Status: None   Collection Time: 12/11/17  9:11 AM  Result Value Ref Range Status   Specimen Description   Final     SPUTUM Performed at [facility], [address] [place], [place] [address]    Special Requests   Final    NONE Performed at [facility], [address] [place], [place] [address]    Gram Stain   Final    MODERATE WBC PRESENT, PREDOMINANTLY PMN FEW GRAM POSITIVE COCCI RARE GRAM NEGATIVE RODS Performed at [facility], [place] [address] [place], [place] [address]    Culture   Final    MODERATE METHICILLIN RESISTANT STAPHYLOCOCCUS AUREUS   Report Status 12/13/2017 FINAL  Final   Organism ID, Bacteria METHICILLIN RESISTANT STAPHYLOCOCCUS AUREUS  Final      Susceptibility   Methicillin resistant staphylococcus aureus - MIC*    CIPROFLOXACIN 4 RESISTANT Resistant     ERYTHROMYCIN >=8 RESISTANT Resistant     GENTAMICIN <=0.5 SENSITIVE Sensitive     OXACILLIN >=4 RESISTANT Resistant     TETRACYCLINE <=1 SENSITIVE Sensitive     VANCOMYCIN <=0.5 SENSITIVE Sensitive     TRIMETH/SULFA <=10 SENSITIVE Sensitive     CLINDAMYCIN <=0.25 SENSITIVE Sensitive     RIFAMPIN <=0.5 SENSITIVE Sensitive     Inducible Clindamycin NEGATIVE Sensitive     * MODERATE METHICILLIN RESISTANT STAPHYLOCOCCUS AUREUS  C difficile quick scan w PCR reflex     Status: Abnormal   Collection Time: 12/11/17  5:42 PM  Result Value Ref Range Status   C Diff antigen POSITIVE (A) NEGATIVE Final   C Diff toxin NEGATIVE NEGATIVE Final   C Diff interpretation Results are indeterminate. See PCR results.  Final    Comment: Performed at [facility], [address] [place], [place] [address]  C. Diff by PCR, Reflexed     Status: None   Collection Time: 12/11/17  5:42 PM  Result Value Ref Range Status   Toxigenic C. Difficile by PCR NEGATIVE NEGATIVE Final    Comment: Patient is colonized with non toxigenic C. difficile. May not need treatment unless significant symptoms are present. Performed at [facility], [address] [place], [place] [address]   Culture,  bal-quantitative     Status: Abnormal (Preliminary result)   Collection Time: 12/14/17 10:00 AM  Result Value Ref Range Status   Specimen Description   Final    BRONCHIAL WASHINGS Performed at [facility], [address], [place], [place] [address]    Special Requests   Final    NONE Performed at [facility], [place],  [place] [address]    Gram Stain   Final    ABUNDANT WBC PRESENT, PREDOMINANTLY PMN NO ORGANISMS SEEN Performed at [facility], [place] [address], [place], [place] [address]    Culture (A)  Final    2,000 COLONIES/mL METHICILLIN RESISTANT STAPHYLOCOCCUS AUREUS   Report Status PENDING  Incomplete   Organism ID, Bacteria METHICILLIN RESISTANT STAPHYLOCOCCUS AUREUS (A)  Final      Susceptibility   Methicillin resistant staphylococcus aureus - MIC*    CIPROFLOXACIN >=8 RESISTANT Resistant     ERYTHROMYCIN >=8 RESISTANT Resistant     GENTAMICIN <=0.5 SENSITIVE Sensitive     OXACILLIN >=4 RESISTANT Resistant     TETRACYCLINE <=1 SENSITIVE Sensitive     VANCOMYCIN <=0.5 SENSITIVE Sensitive     TRIMETH/SULFA <=10 SENSITIVE Sensitive     CLINDAMYCIN <=0.25 SENSITIVE Sensitive     RIFAMPIN <=0.5 SENSITIVE Sensitive     Inducible Clindamycin NEGATIVE Sensitive     * 2,000 COLONIES/mL METHICILLIN RESISTANT STAPHYLOCOCCUS AUREUS   Studies/Results: Dg Chest Port 1 View  Result Date: 12/16/2017 CLINICAL DATA:  Respiratory failure. EXAM: PORTABLE CHEST 1 VIEW COMPARISON:  12/15/2017 FINDINGS: Stable appearance of tracheostomy. Gastric decompression tube extends below the diaphragm. A right-sided PICC line has retracted into the left brachiocephalic vein and is buckled with the catheter tip near the brachiocephalic venous confluence. The heart size and mediastinal contours are within normal limits. Relatively stable dense consolidation of the left lower lobe with probable component of a small left pleural effusion. Stable bilateral perihilar  airspace opacities likely representing a combination airspace disease and atelectasis. No pneumothorax. The visualized skeletal structures are unremarkable. IMPRESSION: 1. Change in positioning of right upper extremity PICC line which has retracted into the left brachiocephalic vein in a buckled configuration with the catheter tip near the brachiocephalic venous confluence. 2. Relatively stable left lower lobe consolidation, probable component of left pleural fluid and bilateral perihilar airspace opacities. Electronically Signed   By: Aletta Edouard M.D.   On: 12/16/2017 08:38   Dg Chest Port 1 View  Result Date: 12/15/2017 CLINICAL DATA:  Mechanical ventilation. EXAM: PORTABLE CHEST 1 VIEW COMPARISON:  12/14/2017 FINDINGS: Tracheostomy tube unchanged. Nasogastric tube courses into the region of the stomach and off the film as tip is not visualized. Right-sided PICC line unchanged with tip over the SVC. Patient is rotated to the left. Lungs are hypoinflated with persistent opacification over the left mid to lower lung likely effusion with atelectasis. Slight worsening bilateral perihilar opacification which may be due to edema or infection. Stable cardiomegaly. Remainder of the exam is unchanged. IMPRESSION: Persistent left base opacification likely moderate effusion with atelectasis. Slight worsening bilateral perihilar opacification which may be due to infection or edema. Tubes and lines as described. Electronically Signed   By: Marin Olp M.D.   On: 12/15/2017 11:09   Medications: I have reviewed the patient's current medications. Scheduled Meds: . ALPRAZolam  0.5 mg Oral Q8H  . amLODipine  10 mg Per Tube Daily  . apixaban  5 mg Per Tube BID  . chlorhexidine gluconate (MEDLINE KIT)  15 mL Mouth Rinse BID  . docusate  100 mg Per Tube BID  . feeding supplement (PRO-STAT SUGAR FREE 64)  30 mL Per Tube Daily  . free water  200 mL Per Tube Q4H  . hydrALAZINE  50 mg Oral Q8H  . insulin aspart  0-15  Units Subcutaneous Q4H  . insulin glargine  10 Units Subcutaneous QHS  . mouth  rinse  15 mL Mouth Rinse 10 times per day  . metoprolol tartrate  50 mg Per Tube BID  . midazolam  2 mg Intravenous Once  . pantoprazole sodium  40 mg Per Tube Daily  . QUEtiapine  25 mg Per Tube BID  . sodium chloride flush  10-40 mL Intracatheter Q12H   Continuous Infusions: . sodium chloride Stopped (12/09/17 2200)  . dexmedetomidine (PRECEDEX) IV infusion 1 mcg/kg/hr (12/16/17 0701)  . doxycycline (VIBRAMYCIN) IV    . feeding supplement (VITAL AF 1.2 CAL) 60 mL/hr at 12/16/17 1000  . fentaNYL infusion INTRAVENOUS 175 mcg/hr (12/16/17 1009)  . phenylephrine (NEO-SYNEPHRINE) Adult infusion Stopped (12/13/17 0916)   PRN Meds:.sodium chloride, acetaminophen **OR** acetaminophen, bisacodyl, bisacodyl, fentaNYL, ipratropium-albuterol, labetalol, lip balm, midazolam, phenol, sennosides, sodium chloride flush   Assessment: Active Problems:   Altered mental status   Protein-calorie malnutrition, severe   Pressure injury of skin   Medhansh C [place] 77 y.o. male S/p tracheostomy , respiratory failure for recurrent aspiration. Discussed with wife regarding G tube placement by Dr Alice Reichert which will be tentatively planned for tomorrow. Discussed risks vs benefits including but not limited to perforation, hemorrhage , bleeding and death.  \ Plan: 1. Hold TF at midnight  2. G tube placement by Dr Alice Reichert tentatively for tomorrow    LOS: 35 days   [name], MD 12/16/2017, 12:23 PM

## 2017-12-17 ENCOUNTER — Encounter: Payer: Self-pay | Admitting: Anesthesiology

## 2017-12-17 ENCOUNTER — Inpatient Hospital Stay: Payer: PPO

## 2017-12-17 ENCOUNTER — Encounter
Admission: EM | Disposition: A | Discharge status: Skilled Nursing Facility | Payer: Self-pay | Source: Home / Self Care | Attending: Internal Medicine

## 2017-12-17 HISTORY — PX: PEG PLACEMENT: SHX5437

## 2017-12-17 LAB — CBC
HCT: 28.5 % — ABNORMAL LOW (ref 40.0–52.0)
Hemoglobin: 9.6 g/dL — ABNORMAL LOW (ref 13.0–18.0)
MCH: 33.5 pg (ref 26.0–34.0)
MCHC: 33.9 g/dL (ref 32.0–36.0)
MCV: 98.9 fL (ref 80.0–100.0)
PLATELETS: 263 10*3/uL (ref 150–440)
RBC: 2.88 MIL/uL — ABNORMAL LOW (ref 4.40–5.90)
RDW: 14 % (ref 11.5–14.5)
WBC: 6.8 10*3/uL (ref 3.8–10.6)

## 2017-12-17 LAB — BASIC METABOLIC PANEL
Anion gap: 5 (ref 5–15)
BUN: 26 mg/dL — ABNORMAL HIGH (ref 8–23)
CALCIUM: 8.3 mg/dL — AB (ref 8.9–10.3)
CO2: 33 mmol/L — AB (ref 22–32)
CREATININE: 0.86 mg/dL (ref 0.61–1.24)
Chloride: 101 mmol/L (ref 98–111)
GFR calc Af Amer: >60 mL/min (ref >60)
GLUCOSE: 114 mg/dL — AB (ref 70–99)
Potassium: 3.8 mmol/L (ref 3.5–5.1)
Sodium: 139 mmol/L (ref 135–145)

## 2017-12-17 LAB — CULTURE, BAL-QUANTITATIVE W GRAM STAIN: Culture: 2000 — AB

## 2017-12-17 LAB — GLUCOSE, CAPILLARY
GLUCOSE-CAPILLARY: 78 mg/dL (ref 70–99)
Glucose-Capillary: 102 mg/dL — ABNORMAL HIGH (ref 70–99)
Glucose-Capillary: 68 mg/dL — ABNORMAL LOW (ref 70–99)
Glucose-Capillary: 70 mg/dL (ref 70–99)
Glucose-Capillary: 82 mg/dL (ref 70–99)
Glucose-Capillary: 83 mg/dL (ref 70–99)
Glucose-Capillary: 96 mg/dL (ref 70–99)

## 2017-12-17 LAB — CULTURE, BAL-QUANTITATIVE

## 2017-12-17 LAB — MAGNESIUM: MAGNESIUM: 1.7 mg/dL (ref 1.7–2.4)

## 2017-12-17 LAB — HEPARIN LEVEL (UNFRACTIONATED): Heparin Unfractionated: 0.56 IU/mL (ref 0.30–0.70)

## 2017-12-17 SURGERY — INSERTION, PEG TUBE
Anesthesia: General

## 2017-12-17 MED ORDER — QUETIAPINE FUMARATE 25 MG PO TABS
50.0000 mg | ORAL_TABLET | Freq: Two times a day (BID) | ORAL | Status: DC
  Administered 2017-12-17 – 2017-12-19 (×3): 50 mg
  Filled 2017-12-17 (×3): qty 2

## 2017-12-17 MED ORDER — CLONAZEPAM 1 MG PO TABS
1.0000 mg | ORAL_TABLET | Freq: Two times a day (BID) | ORAL | Status: DC
  Administered 2017-12-17 – 2017-12-21 (×7): 1 mg via ORAL
  Filled 2017-12-17 (×7): qty 1

## 2017-12-17 MED ORDER — DOPAMINE-DEXTROSE 3.2-5 MG/ML-% IV SOLN
0.0000 ug/kg/min | INTRAVENOUS | Status: DC
  Administered 2017-12-17: 30 ug/kg/min via INTRAVENOUS

## 2017-12-17 MED ORDER — DOPAMINE-DEXTROSE 3.2-5 MG/ML-% IV SOLN
INTRAVENOUS | Status: AC
  Administered 2017-12-17: 30 ug/kg/min via INTRAVENOUS
  Filled 2017-12-17: qty 250

## 2017-12-17 MED ORDER — ATROPINE SULFATE 1 MG/10ML IJ SOSY
2.0000 mg | PREFILLED_SYRINGE | Freq: Once | INTRAMUSCULAR | Status: AC
  Administered 2017-12-17: 1 mg via INTRAVENOUS

## 2017-12-17 MED ORDER — PROPOFOL 1000 MG/100ML IV EMUL
5.0000 ug/kg/min | INTRAVENOUS | Status: DC
  Administered 2017-12-17: 10 ug/kg/min via INTRAVENOUS
  Filled 2017-12-17: qty 100

## 2017-12-17 MED ORDER — DEXTROSE 5 % IV SOLN
INTRAVENOUS | Status: DC
  Administered 2017-12-17 – 2017-12-18 (×2): via INTRAVENOUS

## 2017-12-17 MED ORDER — NOREPINEPHRINE BITARTRATE 1 MG/ML IV SOLN
0.0000 ug/min | INTRAVENOUS | Status: DC
  Administered 2017-12-17: 20 ug/min via INTRAVENOUS
  Administered 2017-12-18: 2 ug/min via INTRAVENOUS
  Filled 2017-12-17: qty 16

## 2017-12-17 MED ORDER — NOREPINEPHRINE 16 MG/250ML-% IV SOLN: 0.0000 ug/min | INTRAVENOUS | Status: DC

## 2017-12-17 MED ORDER — ATROPINE SULFATE 1 MG/10ML IJ SOSY
PREFILLED_SYRINGE | INTRAMUSCULAR | Status: AC
  Administered 2017-12-17: 1 mg via INTRAVENOUS
  Filled 2017-12-17: qty 10

## 2017-12-17 MED ORDER — DEXTROSE 50 % IV SOLN
INTRAVENOUS | Status: AC
  Administered 2017-12-17: 50 mL
  Filled 2017-12-17: qty 50

## 2017-12-17 MED ORDER — ATROPINE SULFATE 1 MG/10ML IJ SOSY
PREFILLED_SYRINGE | INTRAMUSCULAR | Status: AC
  Administered 2017-12-17: 1 mg
  Filled 2017-12-17: qty 10

## 2017-12-17 MED ORDER — DEXTROSE 50 % IV SOLN
12.5000 g | Freq: Once | INTRAVENOUS | Status: AC
  Administered 2017-12-17: 12.5 g via INTRAVENOUS
  Filled 2017-12-17: qty 50

## 2017-12-17 MED ORDER — MAGNESIUM SULFATE IN D5W 1-5 GM/100ML-% IV SOLN
1.0000 g | Freq: Once | INTRAVENOUS | Status: AC
  Administered 2017-12-17: 1 g via INTRAVENOUS
  Filled 2017-12-17: qty 100

## 2017-12-17 MED ORDER — SODIUM CHLORIDE 0.9 % IV BOLUS
1000.0000 mL | Freq: Once | INTRAVENOUS | Status: AC
  Administered 2017-12-17: 1000 mL via INTRAVENOUS

## 2017-12-17 NOTE — Care Management (Addendum)
RNCM spoke with [facility] team Manuela Schwartz [phone] to update on patient's status and anticipated transition to [employer] tomorrow. Manuela Schwartz states that patient has LTAC authorization through 12/19/17- however if patient is unable to transition to [facility] tomorrow (8/21) authorization will need to be re-started.RNCM attempted to reach out to patient's wife however she was not available in ICU or by phone. RNCM will follow.

## 2017-12-17 NOTE — Progress Notes (Signed)
[facility] for heparin drip management  Indication: DVT  Patient Measurements: Height: 5\' 9"  (175.3 cm) Weight: 137 lb 12.6 oz (62.5 kg) IBW/kg (Calculated) : 70.7   Vital Signs: Temp: 98.6 F (37 C) (08/20 0400) Temp Source: Oral (08/19 2000) BP: 138/82 (08/20 0500) Pulse Rate: 119 (08/20 0500)  Labs: Recent Labs    12/15/17 0440 12/16/17 0509 12/16/17 1249 12/16/17 2220 12/17/17 0443  HGB 9.8* 8.2*  --   --  9.6*  HCT 28.4* 23.9*  --   --  28.5*  PLT 222 221  --   --  263  HEPARINUNFRC 0.38 0.30 0.26* 0.47 0.56  CREATININE 0.91 0.88  --   --  0.86    Estimated Creatinine Clearance: 65.6 mL/min (by C-G formula based on SCr of 0.86 mg/dL).  Medical History: Past Medical History:  Diagnosis Date  . Anxiety   . BPH (benign prostatic hyperplasia)   . Colon adenomas    history of  . Diverticulosis   . Hyperlipidemia   . Hypertension    Pharmacy consulted for heparin drip management for 77 yo male admitted with AMS and being treated for DVT of right upper extremety. Patient remains sedated and requiring mechanical ventilation.  08/19 @ 0500 HL 0.30 - Therapeutic x 1  Goal of Therapy:  Heparin level 0.3-0.7 units/ml Monitor platelets by anticoagulation protocol: Yes    Assessment/Plan:  08/19 @ 1249 HL subtherapeutic @ 0.26. Will order 1500unit Bolus and increase infusion to 1400 u/hr. Next HL in 8 hours. CBC with AM labs per protocol.   8/19 2230 heparin level 0.47. Continue current regimen. Recheck heparin level and CBC with tomorrow AM labs.  08/20 AM heparin level 0.56. Continue current regimen. Recheck heparin level and CBC with tomorrow AM labs.   Eloise Harman, PharmD, BCPS Clinical Pharmacist 12/17/2017 5:34 AM

## 2017-12-17 NOTE — Progress Notes (Signed)
CRITICAL CARE NOTE  SUBJECTIVE Patient remains critically ill Prognosis is guarded  77 year old male with acute encephalopathy, sepsis, pneumonia, and dysphagia/dysphonia who was admitted on 8/2. Patient remains intubated and sedated.status post tracheostomy, pending PEG tube placement  SIGNIFICANT EVENTS  No significant events last p.m. Patient on Xanax,Seroquel, Precedex and fentanyl heparin infusion pending PEG tube placement today.  BP (!) 162/96   Pulse (!) 127   Temp 98.6 F (37 C)   Resp 20   Ht 5\' 9"  (1.753 m)   Wt 62.5 kg   SpO2 95%   BMI 20.35 kg/m    PHYSICAL EXAMINATION:  GENERAL:critically ill appearing, Patient appears agitated HEAD: Normocephalic, atraumatic.  EYES: Pupils equal, round, reactive to light.  No scleral icterus.  MOUTH: Moist mucosal membrane. NECK: tracheostomy in place no bleeding noted PULMONARY: rhonchi appreciated on the left CARDIOVASCULAR: S1 and S2. Regular rate and rhythm. No murmurs, rubs, or gallops. 2+ dorsalis pedis pulses bilaterally GASTROINTESTINAL: Soft, nontender, -distended. No masses. Positive bowel sounds. No hepatosplenomegaly.  MUSCULOSKELETAL: No swelling, clubbing, or edema.  NEUROLOGIC: obtunded, GCS<8 SKIN:intact,warm,dry  ASSESSMENT AND PLAN SYNOPSIS   77 year old male admitted on 8/2 for generalized weakness and acute encephalopathy. Patient status post tracheostomy. Still agitated requiring Precedex, fentanyl. Started on Seroquel and Xanax. We'll need to increase dosing to try to wean off sedative drips.  Severe Hypoxic Respiratory Failure secondary to aspiration pneumonia. Patient grew staph aureus, sensitive to tetracycline  Prerenal azotemia. Improving  Altered mental status/failure to thrive. Appreciate neurology's assistance. Will follow all recommendations. Patient has had extensive workup to include MRI of the lumbar and thoracic spine, CT scan of the head, serologic studies for myopathy/neuropathy  H.  Pylori. Compoeting treatment with clarithromycin, amoxicillin and Protonix  DVT and SVT of right upper arm. Continue anticoagulation on hold per tracheostomy  After PEG tube placement [facility]   Critical Care Time devoted to patient care services described in this note is 30 minutes.   Pending transfer to [place], DO  Patient ID: Marc [name], male   DOB: [birth year], 77 y.o.   MRN: [id number] Patient ID: Marc [name], male   DOB: August 10, 1940, 77 y.o.   MRN: [id number]  Patient ID: Marc [name], male   DOB: 04-Apr-1941, 77 y.o.   MRN: [id number] Patient ID: Marc [name], male   DOB: Apr 24, 1941, 77 y.o.   MRN: [id number] Patient ID: Marc [name], male   DOB: Apr 22, 1941, 77 y.o.   MRN: [id number]

## 2017-12-17 NOTE — Progress Notes (Signed)
Inpatient Diabetes Program Recommendations  AACE/ADA: New Consensus Statement on Inpatient Glycemic Control (2019)  Target Ranges:  Prepandial:   less than 140 mg/dL      Peak postprandial:   less than 180 mg/dL (1-2 hours)      Critically ill patients:  140 - 180 mg/dL   Results for PARDEEP, PAUTZ (MRN [id number]) as of 12/17/2017 10:14  Ref. Range 12/16/2017 07:52 12/16/2017 12:00 12/16/2017 16:13 12/16/2017 20:19 12/17/2017 00:13 12/17/2017 04:09 12/17/2017 04:49 12/17/2017 07:34  Glucose-Capillary Latest Ref Range: 70 - 99 mg/dL 95 108 (H) 121 (H) 84 70 68 (L) 96 83   Review of Glycemic Control   Current orders for Inpatient glycemic control: Lantus 10 units daily, Novolog 0-15 units Q4H  Inpatient Diabetes Program Recommendations:  Insulin-Basal: Please consider decreasing Lantus to 8 units daily.  Thanks, Barnie Alderman, RN, MSN, CDE Diabetes Coordinator Inpatient Diabetes Program [phone] (Team Pager from 8am to 5pm)

## 2017-12-17 NOTE — Op Note (Signed)
[facility] Gastroenterology Patient Name: Marc [name] Procedure Date: 12/17/2017 12:37 PM MRN: [id number] Account #: [id number] Date of Birth: 1940/10/20 Admit Type: Inpatient Age: 77 Room: [facility] ENDO ROOM 2 Gender: Male Note Status: Finalized Procedure:            Upper GI endoscopy Indications:          Place PEG because patient is unable to eat, Place PEG                        due to aspiration risk, Place PEG due to neurological                        disorder causing impaired swallowing Providers:            Benay Pike. Alice Reichert MD, MD Referring MD:         Tracie Harrier, MD (Referring MD) Medicines:            Propofol per Anesthesia Complications:        No immediate complications. Procedure:            Pre-Anesthesia Assessment:                       - The risks and benefits of the procedure and the                        sedation options and risks were discussed with the                        patient. All questions were answered and informed                        consent was obtained.                       - The alternatives, risks and benefits of the procedure                        were discussed at length with the patient's spouse. The                        patient's proxy verbalized understanding of the risks                        as well as the alternatives and wished to proceed with                        the procedure.                       - Patient identification and proposed procedure were                        verified prior to the procedure by the nurse. The                        procedure was verified in the procedure room.                       - ASA Grade Assessment: IV - A patient with severe  systemic disease that is a constant threat to life.                       - After reviewing the risks and benefits, the patient                        was deemed in satisfactory condition to undergo the       procedure.                       After obtaining informed consent, the endoscope was                        passed under direct vision. Throughout the procedure,                        the patient's blood pressure, pulse, and oxygen                        saturations were monitored continuously. The Endoscope                        was introduced through the mouth, and advanced to the                        third part of duodenum. The upper GI endoscopy was                        accomplished without difficulty. The patient tolerated                        the procedure well. Findings:      The examined esophagus was normal.      Diffuse mild inflammation characterized by congestion (edema) and       erythema was found in the entire examined stomach.      The examined duodenum was normal.      The patient was placed in the supine position for PEG placement. The       stomach was insufflated to appose gastric and abdominal walls. A site       was located in the body of the stomach with excellent transillumination       for placement. The abdominal wall was marked and prepped in a sterile       manner. The area was anesthetized with 4 mL of 1% lidocaine. The trocar       needle was introduced through the abdominal wall and into the stomach       under direct endoscopic view. A snare was introduced through the       endoscope and opened in the gastric lumen. The guide wire was passed       through the trocar and into the open snare. The snare was closed around       the guide wire. The endoscope and snare were removed, pulling the wire       out through the mouth. A skin incision was made at the site of needle       insertion. The externally removable 20 Fr EndoVive Safety gastrostomy       tube was lubricated. The G-tube was tied to the guide wire and pulled       through  the mouth and into the stomach. The trocar needle was removed,       and the gastrostomy tube was pulled out from  the stomach through the       skin. The external bumper was attached to the gastrostomy tube, and the       tube was cut to remove the guide wire. The final position of the       gastrostomy tube was confirmed by relook endoscopy, and skin marking       noted to be 3 cm at the external bumper. The final tension and       compression of the abdominal wall by the PEG tube and external bumper       were checked and revealed that the bumper was loose and lightly touching       the skin. The feeding tube was capped, and the tube site cleaned and       dressed. Estimated blood loss was minimal.      The exam was otherwise without abnormality. Impression:           - Normal esophagus.                       - Gastritis.                       - Normal examined duodenum.                       - The examination was otherwise normal.                       - An externally removable PEG placement was                        successfully completed.                       - No specimens collected. Recommendation:       - Please follow the post-PEG recommendations including:                        Nutrition consult for formula and volume, external                        bolster snug to abdominal wall, change dressing once                        per day and may use PEG today for meds and water.                       - See inpatient chart for further recommendations Procedure Code(s):    --- Professional ---                       [phone], Esophagogastroduodenoscopy, flexible, transoral;                        with directed placement of percutaneous gastrostomy tube Diagnosis Code(s):    --- Professional ---                       K29.70, Gastritis, unspecified, without bleeding  R63.3, Feeding difficulties                       Z43.1, Encounter for attention to gastrostomy                       R29.818, Other symptoms and signs involving the nervous                        system                        R13.10, Dysphagia, unspecified CPT copyright 2017 American Medical Association. All rights reserved. The codes documented in this report are preliminary and upon coder review may  be revised to meet current compliance requirements. Efrain Sella MD, MD 12/17/2017 3:48:01 PM This report has been signed electronically. Number of Addenda: 0 Note Initiated On: 12/17/2017 12:37 PM      [facility]

## 2017-12-17 NOTE — Interval H&P Note (Signed)
History and Physical Interval Note:  12/17/2017 10:53 AM  Marc [name]  has presented today for surgery, with the diagnosis of UNABLE TO SWALLOW  The various methods of treatment have been discussed with the patient and family. After consideration of risks, benefits and other options for treatment, the patient has consented to  Procedure(s): PERCUTANEOUS ENDOSCOPIC GASTROSTOMY (PEG) PLACEMENT (N/A) as a surgical intervention .  The patient's history has been reviewed, patient examined, no change in status, stable for surgery.  I have reviewed the patient's chart and labs.  Questions were answered to the patient's satisfaction.     [place], [place]

## 2017-12-17 NOTE — Progress Notes (Addendum)
Nutrition Follow-up  DOCUMENTATION CODES:   Severe malnutrition in context of chronic illness, Underweight  INTERVENTION:  When okay to resume tube feeding per GI, recommend initiating a new goal TF regimen of Osmolite 1.5 Cal at 40 mL/hr (960 mL goal daily volume) + Pro-Stat 30 mL BID per tube. Provides 1640 kcal, 90 grams of protein, 730 mL H2O daily.  Provide liquid MVI daily per tube as goal TF regimen does not meet 100% RDIs for vitamins/minerals.  Provide Juven BID per tube, each supplement provides 80 kcal, 14 grams amino acids, and vitamins/minerals essential for wound healing.  Continue free water flush of 200 mL Q4hrs. Provides a total of 1930 mL H2O daily including water in tube feeding.  NUTRITION DIAGNOSIS:   Severe Malnutrition related to chronic illness(etiology unknown at this time) as evidenced by moderate fat depletion, severe fat depletion, moderate muscle depletion, severe muscle depletion.  Ongoing - addressing with TF regimen.  GOAL:   Provide needs based on ASPEN/SCCM guidelines  Met with TF regimen.  MONITOR:   I & O's, Labs, TF tolerance, Weight trends  REASON FOR ASSESSMENT:   Ventilator    ASSESSMENT:   77 year old male with PMHx of HTN, HLD, anxiety, BPH, hx of colon adenomas, diverticulosis who was admitted with AMS of unclear etiology, required intubation on 8/3 for airway protection, possible PNA.   -Patient was extubated on 8/8. -Failed bedside swallow evaluation on 8/9 by SLP so NPO status was continued. -NGT was placed on 8/9 and tube feeds were restarted. -Patient was re-intubated on evening of 8/12 following cardiac arrest and 5-6 minutes of CPR. -Patient s/p tracheostomy tube placement on 8/16. -Rectal tube was removed on 8/17. -Pending PEG tube placement today. -Plan is for assessment for LTAC.  Patient remains on mechanical ventilation through tracheostomy tube. On sedation but remains agitated. Currently on PRVC mode with FiO2  35% and PEEP 5 cmH2O. Abdomen remains soft.  Access: currently no access as patient pulled out his NGT this AM; will be going for PEG tube placement today  MAP: 87-104 mmHg  TF: TFs have been on hold for upcoming PEG tube placement since midnight; pt previously tolerating tube feeds well  Patient is currently intubated on ventilator support Ve: 11.4 L/min Temp (24hrs), Avg:98.9 F (37.2 C), Min:98.6 F (37 C), Max:99.2 F (37.3 C)  Propofol: N/A  Medications reviewed and include: clonazepam, Colace, free water flush 200 mL Q4hrs, Novolog 0-15 units Q4hrs, Lantus 10 units QHS, pantoprazole, Seroquel 50 mg BID, Precedex gtt, D5W at 75 mL/hr while TFs are on hold, doxycycling, fentanyl gtt, heparin gtt.  Labs reviewed: CBG 68-121, CO2 33, BUN 26.  I/O: 2050 mL UOP yesterday (1.4 mL/kg/hr)  No weight to trend since 8/14.  Discussed with RN and on rounds.  Diet Order:   Diet Order            Diet NPO time specified  Diet effective midnight              EDUCATION NEEDS:   No education needs have been identified at this time  Skin:  Skin Assessment: Skin Integrity Issues: Skin Integrity Issues:: Stage II, Incisions Stage II: first assessed on 8/14 Incisions: closed incision to neck  Last BM:  12/14/2017 - 400 mL in rectal tube  Height:   Ht Readings from Last 1 Encounters:  12/11/17 5' 9"  (1.753 m)    Weight:   Wt Readings from Last 1 Encounters:  12/11/17 62.5 kg  Ideal Body Weight:  72.7 kg  BMI:  Body mass index is 20.35 kg/m.  Estimated Nutritional Needs:   Kcal:  3888 (PSU 2003b w/ MSJ 1298, Ve 11.4, Tmax 37.3)  Protein:  85-100 grams (1.5-1.8 grams/kg)  Fluid:  2 L/day  Willey Blade, MS, RD, LDN Office: [phone] Pager: [phone] After Hours/Weekend Pager: [phone]

## 2017-12-17 NOTE — Progress Notes (Signed)
Patient seen/examined s/p PEG tube placement.  Abdomen soft, binder in place. Undone. Active bs's. Site clean.  Advise beginning TF. Orders written. Following.

## 2017-12-17 NOTE — Progress Notes (Signed)
[place] [facility] [place] NAME: Marc [name]    MR#:  [id number]  DATE OF BIRTH:  [birth year]  SUBJECTIVE:  CHIEF COMPLAINT:   Chief Complaint  Patient presents with  . Weakness  . Dizziness  Status post trach, plans for PEG tube with subsequent LTAC placement REVIEW OF SYSTEMS:  CONSTITUTIONAL: No fever, fatigue or weakness.  EYES: No blurred or double vision.  EARS, NOSE, AND THROAT: No tinnitus or ear pain.  RESPIRATORY: No cough, shortness of breath, wheezing or hemoptysis.  CARDIOVASCULAR: No chest pain, orthopnea, edema.  GASTROINTESTINAL: No nausea, vomiting, diarrhea or abdominal pain.  GENITOURINARY: No dysuria, hematuria.  ENDOCRINE: No polyuria, nocturia,  HEMATOLOGY: No anemia, easy bruising or bleeding SKIN: No rash or lesion. MUSCULOSKELETAL: No joint pain or arthritis.   NEUROLOGIC: No tingling, numbness, weakness.  PSYCHIATRY: No anxiety or depression.   ROS  DRUG ALLERGIES:   Allergies  Allergen Reactions  . Glimepiride   . Tape Itching and Rash    VITALS:  Blood pressure 98/63, pulse 66, temperature 98.6 F (37 C), resp. rate 15, height 5\' 9"  (1.753 m), weight 62.5 kg, SpO2 100 %.  PHYSICAL EXAMINATION:  GENERAL:  77 y.o.-year-old patient lying in the bed with no acute distress.  EYES: Pupils equal, round, reactive to light and accommodation. No scleral icterus. Extraocular muscles intact.  HEENT: Head atraumatic, normocephalic. Oropharynx and nasopharynx clear.  NECK:  Supple, no jugular venous distention. No thyroid enlargement, no tenderness.  LUNGS: Normal breath sounds bilaterally, no wheezing, rales,rhonchi or crepitation. No use of accessory muscles of respiration.  CARDIOVASCULAR: S1, S2 normal. No murmurs, rubs, or gallops.  ABDOMEN: Soft, nontender, nondistended. Bowel sounds present. No organomegaly or mass.  EXTREMITIES: No pedal edema, cyanosis, or clubbing.  NEUROLOGIC: Cranial nerves II through XII  are intact. Muscle strength 5/5 in all extremities. Sensation intact. Gait not checked.  PSYCHIATRIC: The patient is alert and oriented x 3.  SKIN: No obvious rash, lesion, or ulcer.   Physical Exam LABORATORY PANEL:   CBC Recent Labs  Lab 12/17/17 0443  WBC 6.8  HGB 9.6*  HCT 28.5*  PLT 263   ------------------------------------------------------------------------------------------------------------------  Chemistries  Recent Labs  Lab 12/13/17 0412  12/17/17 0443  NA 144   < > 139  K 3.7   < > 3.8  CL 103   < > 101  CO2 33*   < > 33*  GLUCOSE 130*   < > 114*  BUN 30*   < > 26*  CREATININE 1.00   < > 0.86  CALCIUM 7.4*   < > 8.3*  MG 1.7   < > 1.7  AST 31  --   --   ALT 64*  --   --   ALKPHOS 115  --   --   BILITOT 1.1  --   --    < > = values in this interval not displayed.   ------------------------------------------------------------------------------------------------------------------  Cardiac Enzymes Recent Labs  Lab 12/11/17 0816 12/12/17 0424  TROPONINI 0.79* 0.51*   ------------------------------------------------------------------------------------------------------------------  RADIOLOGY:  Dg Chest Port 1 View  Result Date: 12/16/2017 CLINICAL DATA:  Respiratory failure. EXAM: PORTABLE CHEST 1 VIEW COMPARISON:  12/15/2017 FINDINGS: Stable appearance of tracheostomy. Gastric decompression tube extends below the diaphragm. A right-sided PICC line has retracted into the left brachiocephalic vein and is buckled with the catheter tip near the brachiocephalic venous confluence. The heart size and mediastinal contours are within normal limits. Relatively stable dense  consolidation of the left lower lobe with probable component of a small left pleural effusion. Stable bilateral perihilar airspace opacities likely representing a combination airspace disease and atelectasis. No pneumothorax. The visualized skeletal structures are unremarkable. IMPRESSION: 1.  Change in positioning of right upper extremity PICC line which has retracted into the left brachiocephalic vein in a buckled configuration with the catheter tip near the brachiocephalic venous confluence. 2. Relatively stable left lower lobe consolidation, probable component of left pleural fluid and bilateral perihilar airspace opacities. Electronically Signed   By: Aletta Edouard M.D.   On: 12/16/2017 08:38    ASSESSMENT AND PLAN:  77 year old male with past medical history significant for hypertension, hyperlipidemia and recent diagnosis of H. pylori gastritis on 11/27/2017 presents to hospital from home secondary to weakness, confusion.  1. Acute respiratory failure- Initially intubated for possible aspiration, extubated however reintubated on 12/09/2017. -Chest x-Kendric today with left-sided opacification and increased mucus plugging. Status post bronchoscopy and improvement in ventilation. -Vent management per intensivist. S/p Trach, will need PEG -Antibiotics changed to vancomycin due to MRSA in sputum  2. Acute encephalopathy-appreciate neurology consult. Initially considered to be secondary to PRES syndrome -MRI of the brain showing mild volume loss and chronic ischemic changes, no acute findings. -Lumbar puncture was negative -Patient still encephalopathic. MRI of the lumbar spine and thoracic spine ordered for lower extremity weakness and repeat CT of the head -Also myasthenia gravis labs, labs for para-neoplastic syndrome ordered - if no improvement recommend repeat MRI brain  3. H. pylori gastritis-diagnosed with biopsy from recent endoscopy about 2 weeks ago. -Continue treatment with Protonix, amoxicillin and clarithromycin  4. Hypertension-labile blood pressures. -Continue metoprolol and Norvasc for now and monitor  5. Right upper extremity DVT-has a PICC line currently in that arm. -Nonocclusive DVT in the right subclavian and an occlusive DVT in the right cephalic  vein seen. -on IV heparin drip for now  Critically ill, guarded prognosis LTAC after PEG tube placement  All the records are reviewed and case discussed with Care Management/Social Workerr. Management plans discussed with the patient, family and they are in agreement.  CODE STATUS: full  TOTAL TIME TAKING CARE OF THIS PATIENT: 35 minutes.     POSSIBLE D/C IN 2-3 DAYS, DEPENDING ON CLINICAL CONDITION.   Avel Peace Kroy Sprung M.D on 12/17/2017   Between 7am to 6pm - Pager - [phone]  After 6pm go to [email or web address] - password [email or web address] [facility]  Office  [phone]  CC: Primary care physician; Tracie Harrier, MD  Note: This dictation was prepared with Dragon dictation along with smaller phrase technology. Any transcriptional errors that result from this process are unintentional.

## 2017-12-17 NOTE — Progress Notes (Signed)
Pharmacy Electrolyte Monitoring Consult:  Pharmacy consulted to assist in monitoring and replacing electrolytes in this 77 y.o. male admitted on 11/29/2017 with Weakness and Dizziness  Patient received tracheostomy on 8/16. Patient is currently sedated on propofol.  Labs:  Sodium (mmol/L)  Date Value  12/17/2017 139   Potassium (mmol/L)  Date Value  12/17/2017 3.8   Magnesium (mg/dL)  Date Value  12/17/2017 1.7   Phosphorus (mg/dL)  Date Value  12/16/2017 4.0   Calcium (mg/dL)  Date Value  12/17/2017 8.3 (L)   Albumin (g/dL)  Date Value  12/13/2017 2.3 (L)   Corrected Calcium: 9.6  Assessment/Plan: Magnesium sulfate 1g IV x 1. Goal magnesium ~ 2, Goal potassium  ~ 4.   Will recheck electrolytes with am labs.  Pharmacy will continue to monitor and adjust per consult.   Pernell Dupre, PharmD, BCPS Clinical Pharmacist 12/17/2017 8:08 AM

## 2017-12-17 NOTE — Care Management (Signed)
Patient's wife is reluctant to transfer patient out of ICU to [facility] per MD.  [facility] received notification from [facility] that RNCM will need to reach back to insurance HTA regarding LTAC placement to see if he still meets criteria. MD is hopeful to have patient transitioned to [facility] tomorrow.

## 2017-12-18 ENCOUNTER — Encounter
Admission: EM | Disposition: A | Discharge status: Skilled Nursing Facility | Payer: Self-pay | Source: Home / Self Care | Attending: Internal Medicine

## 2017-12-18 ENCOUNTER — Inpatient Hospital Stay: Payer: PPO

## 2017-12-18 ENCOUNTER — Inpatient Hospital Stay
Admit: 2017-12-18 | Discharge: 2017-12-18 | Disposition: A | Discharge status: Home / Self Care | Payer: PPO | Attending: Pulmonary Disease | Admitting: Pulmonary Disease

## 2017-12-18 ENCOUNTER — Encounter: Payer: Self-pay | Admitting: Internal Medicine

## 2017-12-18 HISTORY — PX: CORONARY STENT INTERVENTION: CATH118234

## 2017-12-18 HISTORY — PX: CORONARY/GRAFT ACUTE MI REVASCULARIZATION: CATH118305

## 2017-12-18 HISTORY — PX: LEFT HEART CATH AND CORONARY ANGIOGRAPHY: CATH118249

## 2017-12-18 LAB — CBC WITH DIFFERENTIAL/PLATELET
Basophils Absolute: 0 10*3/uL (ref 0–0.1)
Basophils Relative: 1 %
Eosinophils Absolute: 0.1 10*3/uL (ref 0–0.7)
Eosinophils Relative: 1 %
HEMATOCRIT: 24.7 % — AB (ref 40.0–52.0)
HEMOGLOBIN: 8.5 g/dL — AB (ref 13.0–18.0)
LYMPHS PCT: 10 %
Lymphs Abs: 0.5 10*3/uL — ABNORMAL LOW (ref 1.0–3.6)
MCH: 34 pg (ref 26.0–34.0)
MCHC: 34.3 g/dL (ref 32.0–36.0)
MCV: 99.3 fL (ref 80.0–100.0)
MONO ABS: 0.7 10*3/uL (ref 0.2–1.0)
MONOS PCT: 14 %
NEUTROS ABS: 4 10*3/uL (ref 1.4–6.5)
NEUTROS PCT: 74 %
Platelets: 234 10*3/uL (ref 150–440)
RBC: 2.49 MIL/uL — ABNORMAL LOW (ref 4.40–5.90)
RDW: 13.9 % (ref 11.5–14.5)
WBC: 5.4 10*3/uL (ref 3.8–10.6)

## 2017-12-18 LAB — PROTIME-INR
INR: 1.14
Prothrombin Time: 14.5 seconds (ref 11.4–15.2)

## 2017-12-18 LAB — CBC
HEMATOCRIT: 27.1 % — AB (ref 40.0–52.0)
Hemoglobin: 9.2 g/dL — ABNORMAL LOW (ref 13.0–18.0)
MCH: 33.4 pg (ref 26.0–34.0)
MCHC: 33.8 g/dL (ref 32.0–36.0)
MCV: 98.8 fL (ref 80.0–100.0)
Platelets: 282 10*3/uL (ref 150–440)
RBC: 2.75 MIL/uL — ABNORMAL LOW (ref 4.40–5.90)
RDW: 14 % (ref 11.5–14.5)
WBC: 8.1 10*3/uL (ref 3.8–10.6)

## 2017-12-18 LAB — LACTIC ACID, PLASMA
Lactic Acid, Venous: 1.8 mmol/L (ref 0.5–1.9)
Lactic Acid, Venous: 0.4 mmol/L — ABNORMAL LOW (ref 0.5–1.9)

## 2017-12-18 LAB — BLOOD GAS, ARTERIAL
Acid-base deficit: 1.1 mmol/L (ref 0.0–2.0)
Bicarbonate: 23.5 mmol/L (ref 20.0–28.0)
FIO2: 0.35
LHR: 15 {breaths}/min
O2 Saturation: 98.5 %
PEEP/CPAP: 5 cmH2O
PH ART: 7.4 (ref 7.350–7.450)
Patient temperature: 37
VT: 500 mL
pCO2 arterial: 38 mmHg (ref 32.0–48.0)
pO2, Arterial: 116 mmHg — ABNORMAL HIGH (ref 83.0–108.0)

## 2017-12-18 LAB — COMPREHENSIVE METABOLIC PANEL
ALBUMIN: 2.2 g/dL — AB (ref 3.5–5.0)
ALK PHOS: 135 U/L — AB (ref 38–126)
ALT: 35 U/L (ref 0–44)
ANION GAP: 7 (ref 5–15)
AST: 31 U/L (ref 15–41)
BUN: 19 mg/dL (ref 8–23)
CALCIUM: 8 mg/dL — AB (ref 8.9–10.3)
CO2: 31 mmol/L (ref 22–32)
Chloride: 101 mmol/L (ref 98–111)
Creatinine, Ser: 0.94 mg/dL (ref 0.61–1.24)
GFR calc Af Amer: >60 mL/min (ref >60)
GFR calc non Af Amer: >60 mL/min (ref >60)
GLUCOSE: 187 mg/dL — AB (ref 70–99)
Potassium: 3.8 mmol/L (ref 3.5–5.1)
SODIUM: 139 mmol/L (ref 135–145)
Total Bilirubin: 1.4 mg/dL — ABNORMAL HIGH (ref 0.3–1.2)
Total Protein: 5.1 g/dL — ABNORMAL LOW (ref 6.5–8.1)

## 2017-12-18 LAB — TROPONIN I
TROPONIN I: 0.67 ng/mL — AB (ref <0.03)
TROPONIN I: 18.4 ng/mL — AB (ref <0.03)
TROPONIN I: 8.26 ng/mL — AB (ref <0.03)

## 2017-12-18 LAB — POCT ACTIVATED CLOTTING TIME: ACTIVATED CLOTTING TIME: 400 s

## 2017-12-18 LAB — GLUCOSE, CAPILLARY
GLUCOSE-CAPILLARY: 150 mg/dL — AB (ref 70–99)
GLUCOSE-CAPILLARY: 122 mg/dL — AB (ref 70–99)
Glucose-Capillary: 102 mg/dL — ABNORMAL HIGH (ref 70–99)
Glucose-Capillary: 106 mg/dL — ABNORMAL HIGH (ref 70–99)
Glucose-Capillary: 126 mg/dL — ABNORMAL HIGH (ref 70–99)
Glucose-Capillary: 97 mg/dL (ref 70–99)

## 2017-12-18 LAB — MAGNESIUM: Magnesium: 1.6 mg/dL — ABNORMAL LOW (ref 1.7–2.4)

## 2017-12-18 LAB — PHOSPHORUS: PHOSPHORUS: 4.8 mg/dL — AB (ref 2.5–4.6)

## 2017-12-18 LAB — HEPARIN LEVEL (UNFRACTIONATED)

## 2017-12-18 SURGERY — CORONARY/GRAFT ACUTE MI REVASCULARIZATION
Anesthesia: Moderate Sedation

## 2017-12-18 MED ORDER — BIVALIRUDIN TRIFLUOROACETATE 250 MG IV SOLR
INTRAVENOUS | Status: AC
  Filled 2017-12-18: qty 250

## 2017-12-18 MED ORDER — MIDAZOLAM HCL 2 MG/2ML IJ SOLN
INTRAMUSCULAR | Status: DC | PRN
  Administered 2017-12-18 (×3): 1 mg via INTRAVENOUS

## 2017-12-18 MED ORDER — SODIUM BICARBONATE 8.4 % IV SOLN
100.0000 meq | Freq: Once | INTRAVENOUS | Status: AC
  Administered 2017-12-18: 100 meq via INTRAVENOUS

## 2017-12-18 MED ORDER — BIVALIRUDIN BOLUS VIA INFUSION - CUPID
INTRAVENOUS | Status: DC | PRN
  Administered 2017-12-18: 46.875 mg via INTRAVENOUS

## 2017-12-18 MED ORDER — SODIUM CHLORIDE 0.9% FLUSH
3.0000 mL | Freq: Two times a day (BID) | INTRAVENOUS | Status: DC
  Administered 2017-12-18 – 2017-12-19 (×2): 3 mL via INTRAVENOUS

## 2017-12-18 MED ORDER — MAGNESIUM SULFATE 2 GM/50ML IV SOLN
2.0000 g | Freq: Once | INTRAVENOUS | Status: AC
  Administered 2017-12-18: 2 g via INTRAVENOUS
  Filled 2017-12-18: qty 50

## 2017-12-18 MED ORDER — NITROGLYCERIN 1 MG/10 ML FOR IR/CATH LAB
INTRA_ARTERIAL | Status: DC | PRN
  Administered 2017-12-18: 100 ug via INTRACORONARY

## 2017-12-18 MED ORDER — FENTANYL CITRATE (PF) 100 MCG/2ML IJ SOLN
INTRAMUSCULAR | Status: DC | PRN
  Administered 2017-12-18 (×2): 25 ug via INTRAVENOUS

## 2017-12-18 MED ORDER — ACETAMINOPHEN 325 MG PO TABS: 650.0000 mg | ORAL_TABLET | ORAL | Status: DC | PRN

## 2017-12-18 MED ORDER — FENTANYL CITRATE (PF) 100 MCG/2ML IJ SOLN
INTRAMUSCULAR | Status: AC
  Filled 2017-12-18: qty 2

## 2017-12-18 MED ORDER — STERILE WATER FOR INJECTION IV SOLN
INTRAVENOUS | Status: DC
  Filled 2017-12-18: qty 850

## 2017-12-18 MED ORDER — ASPIRIN 325 MG PO TABS
ORAL_TABLET | ORAL | Status: AC
  Administered 2017-12-18: 325 mg
  Filled 2017-12-18: qty 1

## 2017-12-18 MED ORDER — TICAGRELOR 90 MG PO TABS
ORAL_TABLET | ORAL | Status: AC
  Filled 2017-12-18: qty 2

## 2017-12-18 MED ORDER — ASPIRIN 81 MG PO CHEW
81.0000 mg | CHEWABLE_TABLET | Freq: Every day | ORAL | Status: DC
  Administered 2017-12-18 – 2017-12-21 (×4): 81 mg via ORAL
  Filled 2017-12-18 (×4): qty 1

## 2017-12-18 MED ORDER — CLOPIDOGREL BISULFATE 75 MG PO TABS
600.0000 mg | ORAL_TABLET | ORAL | Status: AC
  Administered 2017-12-18: 600 mg via ORAL

## 2017-12-18 MED ORDER — APIXABAN 5 MG PO TABS
5.0000 mg | ORAL_TABLET | Freq: Two times a day (BID) | ORAL | Status: DC
  Administered 2017-12-18 – 2017-12-24 (×12): 5 mg
  Filled 2017-12-18 (×12): qty 1

## 2017-12-18 MED ORDER — ONDANSETRON HCL 4 MG/2ML IJ SOLN: 4.0000 mg | Freq: Four times a day (QID) | INTRAMUSCULAR | Status: DC | PRN

## 2017-12-18 MED ORDER — ASPIRIN 81 MG PO CHEW
324.0000 mg | CHEWABLE_TABLET | ORAL | Status: AC
  Administered 2017-12-18: 324 mg via ORAL

## 2017-12-18 MED ORDER — APIXABAN 5 MG PO TABS: 5.0000 mg | ORAL_TABLET | Freq: Two times a day (BID) | ORAL | Status: DC

## 2017-12-18 MED ORDER — SODIUM CHLORIDE 0.9 % IV SOLN
INTRAVENOUS | Status: AC | PRN
  Administered 2017-12-18: 1.75 mg/kg/h via INTRAVENOUS

## 2017-12-18 MED ORDER — NITROGLYCERIN 5 MG/ML IV SOLN
INTRAVENOUS | Status: AC
  Filled 2017-12-18: qty 10

## 2017-12-18 MED ORDER — METOPROLOL TARTRATE 50 MG PO TABS
25.0000 mg | ORAL_TABLET | Freq: Two times a day (BID) | ORAL | Status: DC
  Administered 2017-12-18 – 2017-12-21 (×3): 25 mg
  Filled 2017-12-18 (×3): qty 1

## 2017-12-18 MED ORDER — SODIUM CHLORIDE 0.9 % IV SOLN: 250.0000 mL | INTRAVENOUS | Status: DC | PRN

## 2017-12-18 MED ORDER — ATORVASTATIN CALCIUM 20 MG PO TABS
80.0000 mg | ORAL_TABLET | Freq: Every day | ORAL | Status: DC
  Administered 2017-12-18 – 2017-12-20 (×3): 80 mg via ORAL
  Filled 2017-12-18 (×3): qty 4

## 2017-12-18 MED ORDER — HEPARIN BOLUS VIA INFUSION
3500.0000 [IU] | Freq: Once | INTRAVENOUS | Status: DC
  Filled 2017-12-18: qty 3500

## 2017-12-18 MED ORDER — HYDRALAZINE HCL 20 MG/ML IJ SOLN: 5.0000 mg | INTRAMUSCULAR | Status: AC | PRN

## 2017-12-18 MED ORDER — SODIUM CHLORIDE 0.9 % WEIGHT BASED INFUSION
1.0000 mL/kg/h | INTRAVENOUS | Status: AC
  Administered 2017-12-18: 1 mL/kg/h via INTRAVENOUS

## 2017-12-18 MED ORDER — LORAZEPAM 2 MG/ML IJ SOLN
2.0000 mg | INTRAMUSCULAR | Status: DC | PRN
  Administered 2017-12-18 (×3): 4 mg via INTRAVENOUS
  Administered 2017-12-18 (×2): 2 mg via INTRAVENOUS
  Administered 2017-12-19 – 2017-12-21 (×2): 4 mg via INTRAVENOUS
  Filled 2017-12-18: qty 2
  Filled 2017-12-18: qty 1
  Filled 2017-12-18 (×2): qty 2
  Filled 2017-12-18 (×2): qty 1
  Filled 2017-12-18 (×2): qty 2

## 2017-12-18 MED ORDER — CLOPIDOGREL BISULFATE 75 MG PO TABS
75.0000 mg | ORAL_TABLET | Freq: Every day | ORAL | Status: DC
  Administered 2017-12-19 – 2017-12-21 (×3): 75 mg via ORAL
  Filled 2017-12-18 (×2): qty 1

## 2017-12-18 MED ORDER — IOPAMIDOL (ISOVUE-300) INJECTION 61%
INTRAVENOUS | Status: DC | PRN
  Administered 2017-12-18: 215 mL via INTRA_ARTERIAL

## 2017-12-18 MED ORDER — MIDAZOLAM HCL 2 MG/2ML IJ SOLN
INTRAMUSCULAR | Status: AC
  Filled 2017-12-18: qty 2

## 2017-12-18 MED ORDER — SODIUM CHLORIDE 0.9% FLUSH
3.0000 mL | INTRAVENOUS | Status: DC | PRN
  Administered 2017-12-21: 3 mL via INTRAVENOUS
  Filled 2017-12-18: qty 3

## 2017-12-18 MED ORDER — HEPARIN (PORCINE) IN NACL 1000-0.9 UT/500ML-% IV SOLN
INTRAVENOUS | Status: AC
  Filled 2017-12-18: qty 1000

## 2017-12-18 MED ORDER — SODIUM CHLORIDE 0.9 % IV SOLN
0.2500 mg/kg/h | INTRAVENOUS | Status: AC
  Filled 2017-12-18: qty 250

## 2017-12-18 MED ORDER — LABETALOL HCL 5 MG/ML IV SOLN: 10.0000 mg | INTRAVENOUS | Status: AC | PRN

## 2017-12-18 MED ORDER — HEPARIN (PORCINE) IN NACL 100-0.45 UNIT/ML-% IJ SOLN
1200.0000 [IU]/h | INTRAMUSCULAR | Status: DC
  Filled 2017-12-18: qty 250

## 2017-12-18 SURGICAL SUPPLY — 19 items
BALLN TREK RX 2.5X15 (BALLOONS) ×3
BALLOON TREK RX 2.5X15 (BALLOONS) ×1 IMPLANT
CATH INFINITI 5FR ANG PIGTAIL (CATHETERS) ×3 IMPLANT
CATH INFINITI 5FR JL4 (CATHETERS) ×3 IMPLANT
CATH INFINITI JR4 5F (CATHETERS) ×3 IMPLANT
CATH VISTA GUIDE 6FR JR4 SH (CATHETERS) ×3 IMPLANT
DEVICE CLOSURE MYNXGRIP 6/7F (Vascular Products) ×3 IMPLANT
DEVICE INFLAT 30 PLUS (MISCELLANEOUS) ×3 IMPLANT
DEVICE SAFEGUARD 24CM (GAUZE/BANDAGES/DRESSINGS) ×3 IMPLANT
KIT MANI 3VAL PERCEP (MISCELLANEOUS) ×3 IMPLANT
NEEDLE PERC 18GX7CM (NEEDLE) ×3 IMPLANT
PACK CARDIAC CATH (CUSTOM PROCEDURE TRAY) ×3 IMPLANT
SHEATH AVANTI 6FR X 11CM (SHEATH) ×3 IMPLANT
STENT RESOLUTE ONYX 2.5X12 (Permanent Stent) ×3 IMPLANT
STENT SIERRA 2.50 X 12 MM (Permanent Stent) ×3 IMPLANT
STENT SIERRA 2.50 X 15 MM (Permanent Stent) ×3 IMPLANT
WIRE G HI TQ BMW 190 (WIRE) ×3 IMPLANT
WIRE GUIDERIGHT .035X150 (WIRE) ×3 IMPLANT
WIRE HITORQ VERSACORE ST 145CM (WIRE) ×3 IMPLANT

## 2017-12-18 NOTE — Progress Notes (Signed)
0400 hr CPT was not completed due to Pt adverse reaction following prior cpt tx. Pt became bradycardic and hypotensive

## 2017-12-18 NOTE — Progress Notes (Signed)
Events of last evening noted.  Patient status post STEMI with need for PCI with left heart cath. Tube feedings on hold.  Previously had done well without any difficulties with feedings or fluid flushes through gastrostomy tube. Currently we will sign off.  Call us in the interim if any problems with the tube or with feeding.  Thank you.  Robet Leu, M.D. [facility] Gastroenterology [phone]

## 2017-12-18 NOTE — CV Procedure (Signed)
Brief procedure note STEMI presentation in house Appears to be inferior wall Patient is nonverbal trached sedated EKG suggested ST elevation inferiorly He had an episode of bradycardia pretension Placed on pressors Critical care team activated code STEMI Patient was brought to the cardiac Cath Lab diagnostic shots were taken of the left system which showed multivessel coronary disease serial 90% lesions in the LAD proximal and mid proximal circumflex disease of 90% as well left main was relatively free of disease RCA was the infarct-related artery with a mid hazy 90% lesion TIMI-3 flow We switched out for 6 French guide BMW wire Angiomax patient already received 600 of Plavix 81 mg of aspirin Access is from the right groin We deployed a 2.515 mm Xience Sierra to 10 atm in the mid segment We added another 2.5 resolute 15 mm to the proximal portion which had a edge dissection Final result was resolution of 99% lesion down to 0 With 2 overlapping 2.5 stents Left ventriculogram was performed which showed inferior hypokinesis ejection fraction at this point around 35 % Anterior apical as well as inferior hypo- Closure device minx was placed Patient received Angiomax at reduced rate for an additional 2 hours Cardiac enzymes are pending Hs was transferred back to ICU Return to critical care teams care Case discussed with the family and critical care team 

## 2017-12-18 NOTE — Progress Notes (Signed)
Patient ID: Marc [name], male   DOB: 24-Mar-1941, 77 y.o.   MRN: [id number] Pulmonary/critical care attending  Patient had rhythm and ST segment change on monitor. 12-lead EKG performed revealed an acute inferior wall MI and bradycardia. Patient had hypotension so atropine given with improvement in pulse. Axis flipped back and QRS improved. Repeat tracing showed resolution of ST segments but still suspicious inferior wall changes. Activated code STEMI, discussed with Dr. Clayborn Bigness being taken to the Cath Lab for angiogram  Hermelinda Dellen, DO

## 2017-12-18 NOTE — Progress Notes (Signed)
[place] for heparin drip management  Indication: DVT  Patient Measurements: Height: 5\' 9"  (175.3 cm) Weight: 137 lb 12.6 oz (62.5 kg) IBW/kg (Calculated) : 70.7   Vital Signs: Temp: 98.5 F (36.9 C) (08/21 0400) Temp Source: Axillary (08/21 0400) BP: 119/71 (08/21 0600) Pulse Rate: 120 (08/21 0600)  Labs: Recent Labs    12/16/17 0509  12/16/17 2220 12/17/17 0443 12/18/17 0050 12/18/17 0521  HGB 8.2*  --   --  9.6* 8.5* 9.2*  HCT 23.9*  --   --  28.5* 24.7* 27.1*  PLT 221  --   --  263 234 282  LABPROT  --   --   --   --  14.5  --   INR  --   --   --   --  1.14  --   HEPARINUNFRC 0.30   < > 0.47 0.56  --  <0.10*  CREATININE 0.88  --   --  0.86 0.94  --   TROPONINI  --   --   --   --  0.67*  --    < > = values in this interval not displayed.    Estimated Creatinine Clearance: 60 mL/min (by C-G formula based on SCr of 0.94 mg/dL).  Medical History: Past Medical History:  Diagnosis Date  . Anxiety   . BPH (benign prostatic hyperplasia)   . Colon adenomas    history of  . Diverticulosis   . Hyperlipidemia   . Hypertension    Pharmacy consulted for heparin drip management for 77 yo male admitted with AMS and being treated for DVT of right upper extremety. Patient remains sedated and requiring mechanical ventilation.  08/19 @ 0500 HL 0.30 - Therapeutic x 1  Goal of Therapy:  Heparin level 0.3-0.7 units/ml Monitor platelets by anticoagulation protocol: Yes    Assessment/Plan:  08/19 @ 1249 HL subtherapeutic @ 0.26. Will order 1500unit Bolus and increase infusion to 1400 u/hr. Next HL in 8 hours. CBC with AM labs per protocol.   8/19 2230 heparin level 0.47. Continue current regimen. Recheck heparin level and CBC with tomorrow AM labs.  08/20 AM heparin level 0.56. Continue current regimen. Recheck heparin level and CBC with tomorrow AM labs.  8/21 AM heparin level <0.1. Heparin off since procedure yesterday.     Eloise Harman, PharmD, BCPS Clinical Pharmacist 12/18/2017 6:32 AM

## 2017-12-18 NOTE — Progress Notes (Signed)
Pharmacy Electrolyte Monitoring Consult:  Pharmacy consulted to assist in monitoring and replacing electrolytes in this 77 y.o. male admitted on 11/29/2017 with Weakness and Dizziness  Patient received tracheostomy on 8/16. Patient is currently sedated on propofol.  Labs:  Sodium (mmol/L)  Date Value  12/18/2017 139   Potassium (mmol/L)  Date Value  12/18/2017 3.8   Magnesium (mg/dL)  Date Value  12/18/2017 1.6 (L)   Phosphorus (mg/dL)  Date Value  12/18/2017 4.8 (H)   Calcium (mg/dL)  Date Value  12/18/2017 8.0 (L)   Albumin (g/dL)  Date Value  12/18/2017 2.2 (L)   Corrected Calcium: 9.4  Assessment/Plan: Magnesium sulfate 2g IV x 1. Goal magnesium ~ 2, Goal potassium  ~ 4.   Will recheck electrolytes with am labs.  Pharmacy will continue to monitor and adjust per consult.   Pernell Dupre, PharmD, BCPS Clinical Pharmacist 12/18/2017 7:38 AM

## 2017-12-18 NOTE — Progress Notes (Addendum)
CRITICAL CARE NOTE  SUBJECTIVE Patient remains critically ill Prognosis is guarded  77 year old male with acute encephalopathy, sepsis, pneumonia, and dysphagia/dysphonia who was admitted on 8/2. Patient remains intubated and sedated.status post tracheostomy, pending PEG tube placement  SIGNIFICANT EVENTS  Patient's IV sedative medicines have been weaned, presently on Seroquel and Klonopin. Status post PEG tube placement. Patient had an episode of hypotension requiring levophed and dopamine. Last PM  BP 119/71   Pulse (!) 120   Temp 98.5 F (36.9 C) (Axillary)   Resp 20   Ht 5\' 9"  (1.753 m)   Wt 62.5 kg   SpO2 93%   BMI 20.35 kg/m    PHYSICAL EXAMINATION:  GENERAL:critically ill appearing, Less agitated this morning HEAD: Normocephalic, atraumatic.  EYES: Pupils equal, round, reactive to light.  No scleral icterus.  MOUTH: Moist mucosal membrane. NECK: tracheostomy in place no bleeding noted PULMONARY: rhonchi appreciated on the left CARDIOVASCULAR: S1 and S2. Regular rate and rhythm. No murmurs, rubs, or gallops. 2+ dorsalis pedis pulses bilaterally GASTROINTESTINAL: Soft, nontender, -distended. No masses. Positive bowel sounds. No hepatosplenomegaly.  MUSCULOSKELETAL: No swelling, clubbing, or edema.  NEUROLOGIC: obtunded, GCS<8 SKIN:intact,warm,dry  ASSESSMENT AND PLAN SYNOPSIS  Patient with an episode of low BP last night. Will check EKG and a set of cardiac enzymes will hold transfer to LTACH   77 year old male admitted on 8/2 for generalized weakness and acute encephalopathy. Patient status post tracheostomy.has been weaned off of IV sedation drips. Is presently on Seroquel and Klonopin. Status post PEG tube placement yesterday  Severe Hypoxic Respiratory Failure secondary to aspiration pneumonia. Patient grew staph aureus, sensitive to tetracycline. Will complete a full course  Prerenal azotemia. Improving  Altered mental status/failure to thrive. Appreciate  neurology's assistance. Will follow all recommendations. Patient has had extensive workup to include MRI of the lumbar and thoracic spine, CT scan of the head, serologic studies for myopathy/neuropathy. Has had some moments where he was more lucid, however lately has not. Some of this may be related to the high amounts of sedatives we need needed to give him. Hopefully this clears  H. Pylori. Compoeting treatment with clarithromycin, amoxicillin and Protonix  DVT and SVT of right upper arm. Will convert to apixaban  Nutrition. Begin tube feeds and recommendations per nutrition after PEG tube  Hermelinda Dellen, DO  Patient ID: Marc [name], male   DOB: [birth year], 77 y.o.   MRN: [id number] Patient ID: Marc [name], male   DOB: 13-Feb-1941, 77 y.o.   MRN: [id number]  Patient ID: Marc [name], male   DOB: 08-May-1940, 77 y.o.   MRN: [id number] Patient ID: Marc [name], male   DOB: 1940/09/27, 77 y.o.   MRN: [id number] Patient ID: Marc [name], male   DOB: 1941/04/20, 77 y.o.   MRN: [id number] Patient ID: Marc [name], male   DOB: 1940-12-03, 77 y.o.   MRN: [id number]

## 2017-12-18 NOTE — Progress Notes (Signed)
Chaplain responded to a code Stemi. Family was in the hallway(wife, daughter and son). Chaplain maintained a pastoral presence and silent pray. Pt was transported to cath lab. Chaplain escorted family to waiting area and offered and executed pray to guild the surgeon's hand. Family is prepared for the worst.    12/18/17 0900  Clinical Encounter Type  Visited With Patient and family together  Visit Type Spiritual support  Spiritual Encounters  Spiritual Needs Prayer;Emotional

## 2017-12-18 NOTE — Care Management (Addendum)
RNCM received notification that patient is declining patient transition at that time to [facility]. Josem Kaufmann will need to be sought again for LTAC as it terms Thursday). Neuro consult pending; currently sedation has been weaned. Update: RNCM notified by MD that patient was now being worked up for MI. [facility] team updated.

## 2017-12-18 NOTE — Consult Note (Addendum)
ANTICOAGULATION CONSULT NOTE - Initial Consult  Pharmacy Consult for Apixaban Indication: DVT  Allergies  Allergen Reactions  . Glimepiride   . Tape Itching and Rash   Patient Measurements: Height: 5\' 9"  (175.3 cm) Weight: 137 lb 12.6 oz (62.5 kg) IBW/kg (Calculated) : 70.7  Vital Signs: Temp: 98.5 F (36.9 C) (08/21 0400) Temp Source: Axillary (08/21 0400) BP: 119/71 (08/21 0600) Pulse Rate: 120 (08/21 0600)  Labs: Recent Labs    12/16/17 0509  12/16/17 2220 12/17/17 0443 12/18/17 0050 12/18/17 0521  HGB 8.2*  --   --  9.6* 8.5* 9.2*  HCT 23.9*  --   --  28.5* 24.7* 27.1*  PLT 221  --   --  263 234 282  LABPROT  --   --   --   --  14.5  --   INR  --   --   --   --  1.14  --   HEPARINUNFRC 0.30   < > 0.47 0.56  --  <0.10*  CREATININE 0.88  --   --  0.86 0.94  --   TROPONINI  --   --   --   --  0.67*  --    < > = values in this interval not displayed.    Estimated Creatinine Clearance: 60 mL/min (by C-G formula based on SCr of 0.94 mg/dL).   Medical History: Past Medical History:  Diagnosis Date  . Anxiety   . BPH (benign prostatic hyperplasia)   . Colon adenomas    history of  . Diverticulosis   . Hyperlipidemia   . Hypertension    Assessment: Pharmacy consulted for apixaban dosing in 77 yo male found to have DVT during hospital stay. Patient had been receiving treatment dose heparin for the past 14 days.      Plan:  Heparin discontinued  Since patient has been treated with treatment dose heparin infusion for past 14 days,the decision was made to start apixaban 5mg  every 12 hours.   Pernell Dupre, PharmD, BCPS Clinical Pharmacist 12/18/2017 7:55 AM

## 2017-12-18 NOTE — Progress Notes (Addendum)
[facility] [place] NAME: Marc [name]    MR#:  [id number]  DATE OF BIRTH:  Oct 21, 1940  SUBJECTIVE:  CHIEF COMPLAINT:   Chief Complaint  Patient presents with  . Weakness  . Dizziness  The patient was found hypotension, tachycardia and STEMI this morning, status post cardiac cath and PCI this morning. REVIEW OF SYSTEMS:   Review of Systems  Unable to perform ROS: Medical condition    DRUG ALLERGIES:   Allergies  Allergen Reactions  . Glimepiride   . Tape Itching and Rash    VITALS:  Blood pressure 119/71, pulse (!) 120, temperature 98.5 F (36.9 C), temperature source Axillary, resp. rate 20, height 5\' 9"  (1.753 m), weight 62.5 kg, SpO2 100 %.  PHYSICAL EXAMINATION:  GENERAL:  77 y.o.-year-old patient lying in the bed, on ventilation via trach EYES: Pupils equal, round, reactive to light and accommodation. No scleral icterus. Extraocular muscles intact.  HEENT: Head atraumatic, normocephalic.   NECK:  Supple, no jugular venous distention. No thyroid enlargement, trachea in situ. LUNGS: Normal breath sounds bilaterally, no wheezing, rales,rhonchi or crepitation. No use of accessory muscles of respiration.  CARDIOVASCULAR: S1, S2 normal. No murmurs, rubs, or gallops.  ABDOMEN: Soft, nontender, nondistended. Bowel sounds present.  PEG in situ.   EXTREMITIES: No pedal edema, cyanosis, or clubbing.  NEUROLOGIC: Unable to exam..  PSYCHIATRIC: The patient is sedated. SKIN: No obvious rash, lesion, or ulcer.   Physical Exam LABORATORY PANEL:   CBC Recent Labs  Lab 12/18/17 0521  WBC 8.1  HGB 9.2*  HCT 27.1*  PLT 282   ------------------------------------------------------------------------------------------------------------------  Chemistries  Recent Labs  Lab 12/18/17 0050  NA 139  K 3.8  CL 101  CO2 31  GLUCOSE 187*  BUN 19  CREATININE 0.94  CALCIUM 8.0*  MG 1.6*  AST 31  ALT 35  ALKPHOS 135*  BILITOT 1.4*    ------------------------------------------------------------------------------------------------------------------  Cardiac Enzymes Recent Labs  Lab 12/12/17 0424 12/18/17 0050  TROPONINI 0.51* 0.67*   ------------------------------------------------------------------------------------------------------------------  RADIOLOGY:  Dg Chest Port 1 View  Result Date: 12/18/2017 CLINICAL DATA:  Increased tracheal secretions EXAM: PORTABLE CHEST 1 VIEW COMPARISON:  12/17/2017 FINDINGS: Tracheostomy tube remains in place, unchanged. Layering bilateral effusions and bilateral perihilar and lower lobe airspace opacities, slightly worsened since prior study. Heart is borderline in size. No acute bony abnormality. IMPRESSION: Increasing layering effusions and bilateral lower lobe airspace opacities which could reflect edema or infection. Electronically Signed   By: Rolm Baptise M.D.   On: 12/18/2017 08:25   Dg Chest Port 1 View  Result Date: 12/18/2017 CLINICAL DATA:  Hypoxia. EXAM: PORTABLE CHEST 1 VIEW COMPARISON:  Multiple prior exams most recent radiograph yesterday FINDINGS: Tracheostomy tube remains at the thoracic inlet. Right upper extremity PICC is unchanged in position, tip near the brachiocephalic confluence and buckled in the brachiocephalic vein. Unchanged dense retrocardiac left lung base consolidation and probable pleural effusion. Bilateral perihilar opacities are also unchanged. Probable slight increase in right pleural effusion. No pneumothorax. IMPRESSION: 1. Probable increase in right pleural effusion. 2. Otherwise unchanged appearance of the chest with dense retrocardiac opacity. Bilateral perihilar opacities are unchanged and may represent atelectasis or airspace disease. Electronically Signed   By: Jeb Levering M.D.   On: 12/18/2017 00:05    ASSESSMENT AND PLAN:  77 year old male with past medical history significant for hypertension, hyperlipidemia and recent diagnosis of H.  pylori gastritis on 11/27/2017 presents to hospital from home secondary to  weakness, confusion.  1. Acute respiratory failure- Initially intubated for possible aspiration, extubated however reintubated on 12/09/2017. -Chest x-Ashtian today with left-sided opacification and increased mucus plugging. Status post bronchoscopy and improvement in ventilation. -Vent management per intensivist. S/p Trach, S/P PEG -Antibiotics changed to vancomycin due to MRSA in sputum  2. Acute encephalopathy-appreciate neurology consult. Initially considered to be secondary to PRES syndrome -MRI of the brain showing mild volume loss and chronic ischemic changes, no acute findings. -Lumbar puncture was negative -Patient still encephalopathic. MRI of the lumbar spine and thoracic spine ordered for lower extremity weakness and repeat CT of the head -Also myasthenia gravis labs, labs for para-neoplastic syndrome ordered - if no improvement recommend repeat MRI brain  3. H. pylori gastritis-diagnosed with biopsy from recent endoscopy about 2 weeks ago. -Continue treatment with Protonix, amoxicillin and clarithromycin  4. Hypertension-labile blood pressures. -Continue metoprolol and Norvasc for now and monitor  5. Right upper extremity DVT-has a PICC line currently in that arm. -Nonocclusive DVT in the right subclavian and an occlusive DVT in the right cephalic vein seen. He was on heparin drip.  Changed to Eliquis.  STEMI. Status posterior cardiac cath and PCI.  He was on Angiomax, started aspirin and Plavix and Lipitor per Dr. Clayborn Bigness.  Critically ill, guarded prognosis LTAC placement  I discussed with Dr. Jefferson Fuel. All the records are reviewed and case discussed with Care Management/Social Workerr. Management plans discussed with the patient, family and they are in agreement.  CODE STATUS: full  TOTAL TIME TAKING CARE OF THIS PATIENT: 36 minutes.   POSSIBLE D/C IN 2-3 DAYS, DEPENDING ON  CLINICAL CONDITION.   [name] M.D on 12/18/2017   Between 7am to 6pm - Pager - [phone]  After 6pm go to [email or web address] - password [email or web address] [facility]  Office  [phone]  CC: Primary care physician; Tracie Harrier, MD  Note: This dictation was prepared with Dragon dictation along with smaller phrase technology. Any transcriptional errors that result from this process are unintentional.

## 2017-12-18 NOTE — Progress Notes (Signed)
Requested by Dr. Clayborn Bigness to activate Code STEMI.Spoke with Marden Noble at [facility], requested activation.

## 2017-12-19 ENCOUNTER — Inpatient Hospital Stay: Payer: PPO

## 2017-12-19 ENCOUNTER — Encounter: Payer: Self-pay | Admitting: Internal Medicine

## 2017-12-19 LAB — BASIC METABOLIC PANEL
Anion gap: 8 (ref 5–15)
BUN: 20 mg/dL (ref 8–23)
CALCIUM: 7.9 mg/dL — AB (ref 8.9–10.3)
CO2: 26 mmol/L (ref 22–32)
CREATININE: 1.07 mg/dL (ref 0.61–1.24)
Chloride: 105 mmol/L (ref 98–111)
GFR calc Af Amer: >60 mL/min (ref >60)
GFR calc non Af Amer: >60 mL/min (ref >60)
Glucose, Bld: 98 mg/dL (ref 70–99)
Potassium: 3.4 mmol/L — ABNORMAL LOW (ref 3.5–5.1)
SODIUM: 139 mmol/L (ref 135–145)

## 2017-12-19 LAB — GLUCOSE, CAPILLARY
GLUCOSE-CAPILLARY: 96 mg/dL (ref 70–99)
GLUCOSE-CAPILLARY: 65 mg/dL — AB (ref 70–99)
Glucose-Capillary: 105 mg/dL — ABNORMAL HIGH (ref 70–99)
Glucose-Capillary: 134 mg/dL — ABNORMAL HIGH (ref 70–99)
Glucose-Capillary: 40 mg/dL — CL (ref 70–99)
Glucose-Capillary: 41 mg/dL — CL (ref 70–99)
Glucose-Capillary: 67 mg/dL — ABNORMAL LOW (ref 70–99)
Glucose-Capillary: 76 mg/dL (ref 70–99)
Glucose-Capillary: 86 mg/dL (ref 70–99)
Glucose-Capillary: 91 mg/dL (ref 70–99)

## 2017-12-19 LAB — BLOOD GAS, ARTERIAL
Acid-Base Excess: 4.8 mmol/L — ABNORMAL HIGH (ref 0.0–2.0)
Bicarbonate: 29.2 mmol/L — ABNORMAL HIGH (ref 20.0–28.0)
FIO2: 0.4
MECHVT: 500 mL
O2 SAT: 98.3 %
PATIENT TEMPERATURE: 37
PCO2 ART: 42 mmHg (ref 32.0–48.0)
PEEP: 5 cmH2O
PO2 ART: 106 mmHg (ref 83.0–108.0)
RATE: 15 resp/min
pH, Arterial: 7.45 (ref 7.350–7.450)

## 2017-12-19 LAB — TROPONIN I: Troponin I: 37.74 ng/mL (ref <0.03)

## 2017-12-19 LAB — MAGNESIUM: MAGNESIUM: 1.9 mg/dL (ref 1.7–2.4)

## 2017-12-19 LAB — ECHOCARDIOGRAM COMPLETE
HEIGHTINCHES: 69 in
Weight: 2204.6 oz

## 2017-12-19 LAB — CBC
HEMATOCRIT: 23.2 % — AB (ref 40.0–52.0)
Hemoglobin: 7.8 g/dL — ABNORMAL LOW (ref 13.0–18.0)
MCH: 33.6 pg (ref 26.0–34.0)
MCHC: 33.8 g/dL (ref 32.0–36.0)
MCV: 99.3 fL (ref 80.0–100.0)
Platelets: 336 10*3/uL (ref 150–440)
RBC: 2.34 MIL/uL — ABNORMAL LOW (ref 4.40–5.90)
RDW: 14.5 % (ref 11.5–14.5)
WBC: 7.8 10*3/uL (ref 3.8–10.6)

## 2017-12-19 LAB — PHOSPHORUS: Phosphorus: 3.9 mg/dL (ref 2.5–4.6)

## 2017-12-19 LAB — ABO/RH: ABO/RH(D): O POS

## 2017-12-19 LAB — CARDIAC CATHETERIZATION: CATHEFQUANT: 30 %

## 2017-12-19 LAB — PREPARE RBC (CROSSMATCH)

## 2017-12-19 MED ORDER — DEXTROSE 50 % IV SOLN
INTRAVENOUS | Status: AC
  Filled 2017-12-19: qty 50

## 2017-12-19 MED ORDER — QUETIAPINE FUMARATE 25 MG PO TABS: 25.0000 mg | ORAL_TABLET | Freq: Every day | ORAL | Status: DC

## 2017-12-19 MED ORDER — SODIUM CHLORIDE 0.9 % IV BOLUS
500.0000 mL | Freq: Once | INTRAVENOUS | Status: AC
  Administered 2017-12-19: 500 mL via INTRAVENOUS

## 2017-12-19 MED ORDER — SODIUM CHLORIDE 0.9% IV SOLUTION: Freq: Once | INTRAVENOUS | Status: AC

## 2017-12-19 MED ORDER — DEXTROSE 50 % IV SOLN
1.0000 | Freq: Once | INTRAVENOUS | Status: AC
  Administered 2017-12-19: 50 mL via INTRAVENOUS

## 2017-12-19 MED ORDER — POTASSIUM CHLORIDE 10 MEQ/50ML IV SOLN
10.0000 meq | INTRAVENOUS | Status: AC
  Administered 2017-12-19 (×4): 10 meq via INTRAVENOUS
  Filled 2017-12-19 (×4): qty 50

## 2017-12-19 MED ORDER — DEXTROSE 5 % IV SOLN
INTRAVENOUS | Status: DC
  Administered 2017-12-19 – 2017-12-20 (×2): via INTRAVENOUS

## 2017-12-19 MED ORDER — JUVEN PO PACK
1.0000 | PACK | Freq: Two times a day (BID) | ORAL | Status: DC
  Administered 2017-12-19 – 2017-12-24 (×11): 1

## 2017-12-19 MED ORDER — OSMOLITE 1.5 CAL PO LIQD
1000.0000 mL | ORAL | Status: DC
  Administered 2017-12-19 – 2017-12-24 (×6): 1000 mL

## 2017-12-19 MED ORDER — ADULT MULTIVITAMIN LIQUID CH
15.0000 mL | Freq: Every day | ORAL | Status: DC
  Administered 2017-12-20 – 2017-12-28 (×9): 15 mL
  Filled 2017-12-19 (×10): qty 15

## 2017-12-19 MED ORDER — QUETIAPINE FUMARATE 25 MG PO TABS
75.0000 mg | ORAL_TABLET | Freq: Every day | ORAL | Status: DC
  Administered 2017-12-19 – 2017-12-20 (×2): 75 mg via ORAL
  Filled 2017-12-19 (×2): qty 3

## 2017-12-19 MED ORDER — QUETIAPINE FUMARATE 25 MG PO TABS
50.0000 mg | ORAL_TABLET | Freq: Every morning | ORAL | Status: DC
  Administered 2017-12-20 – 2017-12-28 (×9): 50 mg
  Filled 2017-12-19 (×9): qty 2

## 2017-12-19 MED ORDER — PRO-STAT SUGAR FREE PO LIQD
30.0000 mL | Freq: Two times a day (BID) | ORAL | Status: DC
  Administered 2017-12-19 – 2017-12-24 (×12): 30 mL

## 2017-12-19 MED ORDER — SODIUM CHLORIDE 0.9 % IV SOLN
0.0000 ug/min | INTRAVENOUS | Status: DC
  Administered 2017-12-19: 75 ug/min via INTRAVENOUS
  Administered 2017-12-19: 20 ug/min via INTRAVENOUS
  Administered 2017-12-20: 70 ug/min via INTRAVENOUS
  Administered 2017-12-20: 75 ug/min via INTRAVENOUS
  Administered 2017-12-21 (×2): 70 ug/min via INTRAVENOUS
  Filled 2017-12-19 (×2): qty 4
  Filled 2017-12-19 (×3): qty 40

## 2017-12-19 NOTE — Progress Notes (Signed)
Pharmacy Electrolyte Monitoring Consult:  Pharmacy consulted to assist in monitoring and replacing electrolytes in this 77 y.o. male admitted on 11/29/2017 with Weakness and Dizziness  Patient received tracheostomy on 8/16. Patient is currently sedated on propofol.  Labs:  Sodium (mmol/L)  Date Value  12/19/2017 139   Potassium (mmol/L)  Date Value  12/19/2017 3.4 (L)   Magnesium (mg/dL)  Date Value  12/19/2017 1.9   Phosphorus (mg/dL)  Date Value  12/19/2017 3.9   Calcium (mg/dL)  Date Value  12/19/2017 7.9 (L)   Albumin (g/dL)  Date Value  12/18/2017 2.2 (L)   Corrected Calcium: 9.4  Assessment/Plan: KCL 63mEq x 4 IV ordered. Goal magnesium ~ 2, Goal potassium  ~ 4.   Will recheck electrolytes with am labs.  Pharmacy will continue to monitor and adjust per consult.   Pernell Dupre, PharmD, BCPS Clinical Pharmacist 12/19/2017 7:22 AM

## 2017-12-19 NOTE — Care Management (Signed)
Discussed with provider plan of transition to LTAC and provider declines at this time due to recent medical concern.

## 2017-12-19 NOTE — Progress Notes (Signed)
Inpatient Diabetes Program Recommendations  AACE/ADA: New Consensus Statement on Inpatient Glycemic Control (2015)  Target Ranges:  Prepandial:   less than 140 mg/dL      Peak postprandial:   less than 180 mg/dL (1-2 hours)      Critically ill patients:  140 - 180 mg/dL   Results for EUSEBIO, BLAZEJEWSKI (MRN [id number]) as of 12/19/2017 12:21  Ref. Range 12/19/2017 00:19 12/19/2017 03:53 12/19/2017 07:38 12/19/2017 11:51 12/19/2017 12:11  Glucose-Capillary Latest Ref Range: 70 - 99 mg/dL 91 96 76 40 (LL) 41 (LL)      Current Orders: Lantus 10 units QHS      Novolog Moderate Correction Scale/ SSI (0-15 units) Q4 hours       Hypoglycemic at 12pm today.   MD- Please consider discontinuation of Lantus for now    --Will follow patient during hospitalization--  Wyn Quaker RN, MSN, CDE Diabetes Coordinator Inpatient Glycemic Control Team Team Pager: [phone] (8a-5p)

## 2017-12-19 NOTE — Progress Notes (Signed)
Hypoglycemic Event  CBG:41  Treatment: Amp of D50  Symptoms: asymptomatic   Follow-up CBG: Time:1320 CBG Result:105  Possible Reasons for Event: feeding tube on hold   Comments/MD notified:NP [name]

## 2017-12-19 NOTE — Progress Notes (Signed)
77 year old male with acute encephalopathy, sepsis, pneumonia, and dysphagia/dysphonia who was admitted on 8/2 Pt has had 3 intubations and and is now s/p PEG placement. Neurology consulted for increased agitation.    On examination, he does not open eyes, doesn't follow or track. Withdraws from [name] bilaterally. He is s/p Versed due to agitation for CTH  - CTH with atrophy and white matter changes but no other acute abnormalities  - Pt has been here for 20 days and has delirium - QTC reviewed at 400.   - Will start nightly Seroquel at 25 and if doesn't help increase to 50 nightly.   Marc [name]

## 2017-12-19 NOTE — Progress Notes (Signed)
Hypoglycemic Event  CBG:67  Treatment: D5 at 25/hr to 50/hr Symptoms: asymptomatic   Follow-up CBG: Time:1848 CBG Result:86  Possible Reasons for Event: feeding tube only recently started   Comments/MD notified: Conforti    Elliot Gault

## 2017-12-19 NOTE — Care Management (Signed)
RNCM returned call from Iraan with [facility] [phone] to update on patient status.  [facility] will need to close out current LTAC authorization and Case Management can call  back when ready to proceed.

## 2017-12-19 NOTE — Progress Notes (Signed)
[facility] [place] NAME: Elizandro Laura    MR#:  [id number]  DATE OF BIRTH:  August 12, 1940  SUBJECTIVE:  CHIEF COMPLAINT:   Chief Complaint  Patient presents with  . Weakness  . Dizziness  The patient still has hypotension, on Levophed drip.  He was agitated this morning. REVIEW OF SYSTEMS:   Review of Systems  Unable to perform ROS: Medical condition    DRUG ALLERGIES:   Allergies  Allergen Reactions  . Glimepiride   . Tape Itching and Rash    VITALS:  Blood pressure (!) 62/46, pulse (!) 104, temperature 98.8 F (37.1 C), temperature source Oral, resp. rate 20, height 5\' 9"  (1.753 m), weight 62.5 kg, SpO2 95 %.  PHYSICAL EXAMINATION:  GENERAL:  77 y.o.-year-old patient lying in the bed, on ventilation via trach EYES: Pupils equal, round, reactive to light and accommodation. No scleral icterus. Extraocular muscles intact.  HEENT: Head atraumatic, normocephalic.   NECK:  Supple, no jugular venous distention. No thyroid enlargement, trachea in situ. LUNGS: Normal breath sounds bilaterally, no wheezing, rales,rhonchi or crepitation. No use of accessory muscles of respiration.  CARDIOVASCULAR: S1, S2 normal. No murmurs, rubs, or gallops.  ABDOMEN: Soft, nontender, nondistended. Bowel sounds present.  PEG in situ.   EXTREMITIES: No pedal edema, cyanosis, or clubbing.  NEUROLOGIC: Unable to exam..  PSYCHIATRIC: The patient is sedated. SKIN: No obvious rash, lesion, or ulcer.   Physical Exam LABORATORY PANEL:   CBC Recent Labs  Lab 12/19/17 0140  WBC 7.8  HGB 7.8*  HCT 23.2*  PLT 336   ------------------------------------------------------------------------------------------------------------------  Chemistries  Recent Labs  Lab 12/18/17 0050 12/19/17 0140  NA 139 139  K 3.8 3.4*  CL 101 105  CO2 31 26  GLUCOSE 187* 98  BUN 19 20  CREATININE 0.94 1.07  CALCIUM 8.0* 7.9*  MG 1.6* 1.9  AST 31  --   ALT 35  --    ALKPHOS 135*  --   BILITOT 1.4*  --    ------------------------------------------------------------------------------------------------------------------  Cardiac Enzymes Recent Labs  Lab 12/18/17 1828 12/19/17 0021  TROPONINI 8.26* 37.74*   ------------------------------------------------------------------------------------------------------------------  RADIOLOGY:  Ct Abdomen Pelvis Wo Contrast  Result Date: 12/19/2017 CLINICAL DATA:  Abdominal distention. Severe sepsis. Recent PEG placement. EXAM: CT ABDOMEN AND PELVIS WITHOUT CONTRAST TECHNIQUE: Multidetector CT imaging of the abdomen and pelvis was performed following the standard protocol without IV contrast. COMPARISON:  Abdominal x-rays from same day. CT abdomen pelvis dated August 06, 2017. FINDINGS: Lower chest: Moderate bilateral pleural effusions with left greater than right lower lobe collapse. Hepatobiliary: No focal liver abnormality is seen. Status post cholecystectomy. No biliary dilatation. Pancreas: Unremarkable. No pancreatic ductal dilatation or surrounding inflammatory changes. Spleen: Normal in size without focal abnormality. Adrenals/Urinary Tract: The adrenal glands are unremarkable. Persistent enhancement of the bilateral kidneys with a small amount of excreted contrast in the collecting systems, ureters, and bladder. The bladder is decompressed by Foley catheter. Stomach/Bowel: Interval placement of a PEG tube in appropriate position. No bowel wall thickening, distention, or surrounding inflammatory changes. Left-sided colonic diverticulosis. Vascular/Lymphatic: Aortic atherosclerosis. No enlarged abdominal or pelvic lymph nodes. Reproductive: Unchanged prostatomegaly. Other: Moderate pneumoperitoneum. Small pelvic ascites. Prominent presacral stranding and fluid. Mild anasarca. No hematoma. Musculoskeletal: No acute or significant osseous findings. IMPRESSION: 1. Moderate pneumoperitoneum. This is felt to be related to  recent PEG placement, as there are no bowel inflammatory changes to suggest perforation. 2. Persistent enhancement of the bilateral kidneys,  consistent with acute kidney injury/ATN. 3. Moderate bilateral pleural effusions with left greater than right lower lobe collapse. 4.  Aortic atherosclerosis (ICD10-I70.0). Electronically Signed   By: Titus Dubin M.D.   On: 12/19/2017 11:12   Dg Abd 1 View  Result Date: 12/19/2017 CLINICAL DATA:  Abdominal swelling. EXAM: ABDOMEN - 1 VIEW COMPARISON:  12/06/2017 FINDINGS: Signs of pneumoperitoneum (visible inferior diaphragmatic surface and right inferior liver surface). Patient had PEG tube placement 2 days ago. The tube is in expected position over the left upper quadrant. Bowel gas pattern is normal. A call has been placed to the ordering provider. IMPRESSION: 1. Pneumoperitoneum in this patient with PEG placement 2 days ago. 2. Chart history of dropping hemoglobin, detection of postoperative bleeding would require abdominal CT (contrast not essential). Electronically Signed   By: Monte Fantasia M.D.   On: 12/19/2017 09:21   Ct Head Wo Contrast  Result Date: 12/19/2017 CLINICAL DATA:  Dysphagia/dysphonia within cephalopathy EXAM: CT HEAD WITHOUT CONTRAST TECHNIQUE: Contiguous axial images were obtained from the base of the skull through the vertex without intravenous contrast. COMPARISON:  December 10, 2017 FINDINGS: Brain: There is age related volume loss. There is no intracranial mass, hemorrhage, extra-axial fluid collection, or midline shift. There is mild small vessel disease in the centra semiovale bilaterally. There is also apparent small vessel disease in the mid pons in the basilar perforator distribution. No acute appearing infarct is evident. Vascular: No hyperdense vessel. There are foci of calcification in each distal vertebral artery and carotid siphon region. Skull: The bony calvarium appears intact. Sinuses/Orbits: There is mucosal thickening in  several ethmoid air cells. There is a small retention cyst in the posterior right maxillary antrum. Orbits appear symmetric bilaterally. Other: There is opacification in several mastoid air cells bilaterally. IMPRESSION: Age related volume loss with patchy periventricular and pontine small vessel disease. No acute infarct evident. No mass or hemorrhage. There are foci of arterial vascular calcification. There are areas of paranasal sinus and mastoid disease. Electronically Signed   By: Lowella Grip III M.D.   On: 12/19/2017 10:56   Dg Chest Port 1 View  Result Date: 12/19/2017 CLINICAL DATA:  Tracheostomy EXAM: PORTABLE CHEST 1 VIEW COMPARISON:  Chest radiograph from one day prior. FINDINGS: Tracheostomy tube tip overlies the tracheal air column at the thoracic inlet. Right PICC position is stable with coiled tip in the upper mediastinum probably within the left brachiocephalic vein. Stable cardiomediastinal silhouette with normal heart size. No pneumothorax. Stable small bilateral pleural effusions. Lung volumes are slightly decreased. Patchy opacities throughout both lungs appear slightly worsened at the lung bases. IMPRESSION: 1. Stable malpositioned right PICC with coiling of the tip in the upper mediastinum probably within the left brachiocephalic vein. 2. Well-positioned tracheostomy tube. 3. Patchy opacities throughout both lungs appear slightly worsened at the lung bases. 4. Stable small bilateral pleural effusions. Electronically Signed   By: Ilona Sorrel M.D.   On: 12/19/2017 09:21   Dg Chest Port 1 View  Result Date: 12/18/2017 CLINICAL DATA:  Increased tracheal secretions EXAM: PORTABLE CHEST 1 VIEW COMPARISON:  12/17/2017 FINDINGS: Tracheostomy tube remains in place, unchanged. Layering bilateral effusions and bilateral perihilar and lower lobe airspace opacities, slightly worsened since prior study. Heart is borderline in size. No acute bony abnormality. IMPRESSION: Increasing layering  effusions and bilateral lower lobe airspace opacities which could reflect edema or infection. Electronically Signed   By: Rolm Baptise M.D.   On: 12/18/2017 08:25   Dg Chest [facility]  1 View  Result Date: 12/18/2017 CLINICAL DATA:  Hypoxia. EXAM: PORTABLE CHEST 1 VIEW COMPARISON:  Multiple prior exams most recent radiograph yesterday FINDINGS: Tracheostomy tube remains at the thoracic inlet. Right upper extremity PICC is unchanged in position, tip near the brachiocephalic confluence and buckled in the brachiocephalic vein. Unchanged dense retrocardiac left lung base consolidation and probable pleural effusion. Bilateral perihilar opacities are also unchanged. Probable slight increase in right pleural effusion. No pneumothorax. IMPRESSION: 1. Probable increase in right pleural effusion. 2. Otherwise unchanged appearance of the chest with dense retrocardiac opacity. Bilateral perihilar opacities are unchanged and may represent atelectasis or airspace disease. Electronically Signed   By: Jeb Levering M.D.   On: 12/18/2017 00:05    ASSESSMENT AND PLAN:  77 year old male with past medical history significant for hypertension, hyperlipidemia and recent diagnosis of H. pylori gastritis on 11/27/2017 presents to hospital from home secondary to weakness, confusion.  1. Acute respiratory failure- Initially intubated for possible aspiration, extubated however reintubated on 12/09/2017. -Chest x-H[place]inson today with left-sided opacification and increased mucus plugging. Status post bronchoscopy and improvement in ventilation. -Vent management per intensivist. S/p Trach, S/P PEG -Antibiotics changed to vancomycin due to MRSA in sputum, completed tetracycline.  2. Acute encephalopathy-appreciate neurology consult. Initially considered to be secondary to PRES syndrome -MRI of the brain showing mild volume loss and chronic ischemic changes, no acute findings. -Lumbar puncture was negative -Patient still  encephalopathic. MRI of the lumbar spine and thoracic spine ordered for lower extremity weakness and repeat CT of the head -Also myasthenia gravis labs, labs for para-neoplastic syndrome ordered On Precedex.   Per Dr. Irish Elders, start nightly Seroquel at 25 and if doesn't help increase to 50 nightly.   3. H. pylori gastritis-diagnosed with biopsy from recent endoscopy about 2 weeks ago. -Continue treatment with Protonix, amoxicillin and clarithromycin  4. Hypertension-labile blood pressures. Hold metoprolol and Norvasc due to hypotension.  5. Right upper extremity DVT-has a PICC line currently in that arm. -Nonocclusive DVT in the right subclavian and an occlusive DVT in the right cephalic vein seen. He was on heparin drip.  Changed to Eliquis.  STEMI. Status posterior cardiac cath and PCI.  He was on Angiomax, started aspirin and Plavix and Lipitor per Dr. Clayborn Bigness.  Hypotension.  Continue dopamine drip.  Critically ill, guarded prognosis LTAC placement  I discussed with Dr. Jefferson Fuel. All the records are reviewed and case discussed with Care Management/Social Workerr. Management plans discussed with the patient's wife and they are in agreement.  CODE STATUS: full  TOTAL TIME TAKING CARE OF THIS PATIENT: 36 minutes.   POSSIBLE D/C IN ? DAYS, DEPENDING ON CLINICAL CONDITION.   [name] M.D on 12/19/2017   Between 7am to 6pm - Pager - [phone]  After 6pm go to [email or web address] - password [email or web address] [facility]  Office  [phone]  CC: Primary care physician; Tracie Harrier, MD  Note: This dictation was prepared with Dragon dictation along with smaller phrase technology. Any transcriptional errors that result from this process are unintentional.

## 2017-12-19 NOTE — Progress Notes (Signed)
CRITICAL CARE NOTE  SUBJECTIVE Patient remains critically ill Prognosis is guarded  77 year old male with acute encephalopathy, sepsis, pneumonia, and dysphagia/dysphonia who was admitted on 8/2. Patient remains intubated and sedated.status post tracheostomy, pending PEG tube placement  SIGNIFICANT EVENTS  Patient developed an episode of bradycardia, hypotension, had acute inferior wall changes, status post emergent cardiac catheterization, PCI/DES 2 in the RCA. Overnight patient had additional episode of hypotension requiring norepinephrine  BP 96/66   Pulse (!) 102   Temp 98.4 F (36.9 C) (Axillary)   Resp 15   Ht 5\' 9"  (1.753 m)   Wt 62.5 kg   SpO2 98%   BMI 20.35 kg/m    PHYSICAL EXAMINATION:  GENERAL:critically ill appearing, Still with persistent agitation HEAD: Normocephalic, atraumatic.  EYES: Pupils equal, round, reactive to light.  No scleral icterus.  MOUTH: Moist mucosal membrane. NECK: tracheostomy in place no bleeding noted PULMONARY: rhonchi appreciated on the left CARDIOVASCULAR: S1 and S2. Regular rate and rhythm. No murmurs, rubs, or gallops. 2+ dorsalis pedis pulses bilaterally GASTROINTESTINAL: Soft, nontender, -distended. No masses. Positive bowel sounds. No hepatosplenomegaly.  MUSCULOSKELETAL: No swelling, clubbing, or edema.  NEUROLOGIC: obtunded, GCS<8 SKIN:intact,warm,dry  ASSESSMENT AND PLAN SYNOPSIS  Status post inferior wall myocardial infarction, status post PCI/drug-eluting stent 2. Peak troponin thus far is 37.74. Patient is presently on apixaban aspirin, plavix  Patient with an episode of low BP last night. tarted on norepinephrine, will check chest x-rayand blood work this morning. Will change to Neo-Synephrine to try to decrease tachycardia   Severe agitation. Patient persists with severe agitation. Will obtain CT scan of his head and reconsult neurology for further evaluation and management suggestions  Severe Hypoxic Respiratory  Failure secondary to aspiration pneumonia. Patient grew staph aureus, sensitive to tetracycline. Will complete a full course  DVT and SVT of right upper arm. Will convert to apixaban  Hypokalemia we'll replace  Anemia. Hemoglobin is 7.8 has decreased from 9.2, will transfuse 1 unit of packed red blood cells  Hermelinda Dellen, DO  Patient ID: Marc [name], male   DOB: Feb 05, 1941, 77 y.o.   MRN: [id number] Patient ID: Marc [name], male   DOB: Sep 02, 1940, 77 y.o.   MRN: [id number]  Patient ID: Marc [name], male   DOB: May 09, 1940, 77 y.o.   MRN: [id number] Patient ID: Marc [name], male   DOB: 1941-04-09, 77 y.o.   MRN: [id number] Patient ID: Marc [name], male   DOB: [birth year], 77 y.o.   MRN: [id number] Patient ID: Marc [name], male   DOB: May 20, 1940, 77 y.o.   MRN: [id number] Patient ID: Marc [name], male   DOB: February 17, 1941, 77 y.o.   MRN: [id number]

## 2017-12-20 LAB — BASIC METABOLIC PANEL
ANION GAP: 5 (ref 5–15)
BUN: 26 mg/dL — ABNORMAL HIGH (ref 8–23)
CHLORIDE: 107 mmol/L (ref 98–111)
CO2: 29 mmol/L (ref 22–32)
Calcium: 7.8 mg/dL — ABNORMAL LOW (ref 8.9–10.3)
Creatinine, Ser: 0.99 mg/dL (ref 0.61–1.24)
GFR calc Af Amer: >60 mL/min (ref >60)
GLUCOSE: 153 mg/dL — AB (ref 70–99)
POTASSIUM: 3.5 mmol/L (ref 3.5–5.1)
Sodium: 141 mmol/L (ref 135–145)

## 2017-12-20 LAB — TYPE AND SCREEN
ABO/RH(D): O POS
Antibody Screen: NEGATIVE
Unit division: 0

## 2017-12-20 LAB — CBC
HEMATOCRIT: 26 % — AB (ref 40.0–52.0)
HEMOGLOBIN: 8.9 g/dL — AB (ref 13.0–18.0)
MCH: 33.4 pg (ref 26.0–34.0)
MCHC: 34.3 g/dL (ref 32.0–36.0)
MCV: 97.4 fL (ref 80.0–100.0)
Platelets: 261 10*3/uL (ref 150–440)
RBC: 2.67 MIL/uL — ABNORMAL LOW (ref 4.40–5.90)
RDW: 16.3 % — ABNORMAL HIGH (ref 11.5–14.5)
WBC: 6 10*3/uL (ref 3.8–10.6)

## 2017-12-20 LAB — HEMOGLOBIN AND HEMATOCRIT, BLOOD
HCT: 25.4 % — ABNORMAL LOW (ref 40.0–52.0)
Hemoglobin: 8.5 g/dL — ABNORMAL LOW (ref 13.0–18.0)

## 2017-12-20 LAB — GLUCOSE, CAPILLARY
GLUCOSE-CAPILLARY: 168 mg/dL — AB (ref 70–99)
GLUCOSE-CAPILLARY: 140 mg/dL — AB (ref 70–99)
GLUCOSE-CAPILLARY: 131 mg/dL — AB (ref 70–99)
Glucose-Capillary: 148 mg/dL — ABNORMAL HIGH (ref 70–99)
Glucose-Capillary: 170 mg/dL — ABNORMAL HIGH (ref 70–99)
Glucose-Capillary: 194 mg/dL — ABNORMAL HIGH (ref 70–99)

## 2017-12-20 LAB — MAGNESIUM: Magnesium: 1.7 mg/dL (ref 1.7–2.4)

## 2017-12-20 LAB — BPAM RBC
BLOOD PRODUCT EXPIRATION DATE: 201909082359
ISSUE DATE / TIME: 201908221332
Unit Type and Rh: 5100

## 2017-12-20 LAB — MISC LABCORP TEST (SEND OUT): LABCORP TEST CODE: 813940

## 2017-12-20 MED ORDER — ORAL CARE MOUTH RINSE
15.0000 mL | OROMUCOSAL | Status: DC
  Administered 2017-12-20 – 2017-12-28 (×76): 15 mL via OROMUCOSAL

## 2017-12-20 MED ORDER — METOPROLOL TARTRATE 5 MG/5ML IV SOLN
2.5000 mg | INTRAVENOUS | Status: DC | PRN
  Administered 2017-12-20 – 2017-12-22 (×3): 2.5 mg via INTRAVENOUS
  Filled 2017-12-20 (×2): qty 5

## 2017-12-20 MED ORDER — MAGNESIUM SULFATE 2 GM/50ML IV SOLN
2.0000 g | Freq: Once | INTRAVENOUS | Status: AC
  Administered 2017-12-20: 2 g via INTRAVENOUS
  Filled 2017-12-20: qty 50

## 2017-12-20 MED ORDER — CHLORHEXIDINE GLUCONATE 0.12% ORAL RINSE (MEDLINE KIT)
15.0000 mL | Freq: Two times a day (BID) | OROMUCOSAL | Status: DC
  Administered 2017-12-20 – 2017-12-28 (×14): 15 mL via OROMUCOSAL

## 2017-12-20 MED ORDER — POTASSIUM CHLORIDE 20 MEQ PO PACK
40.0000 meq | PACK | Freq: Once | ORAL | Status: AC
  Administered 2017-12-20: 40 meq

## 2017-12-20 NOTE — Progress Notes (Signed)
Per Dr. Jefferson Fuel, hold SS insulin at this time due to hypoglycemic episodes yesterday and patient remaining on D5.

## 2017-12-20 NOTE — Progress Notes (Signed)
CRITICAL CARE NOTE  SUBJECTIVE Patient remains critically ill Prognosis is guarded  77 year old male with acute encephalopathy, sepsis, pneumonia, and dysphagia/dysphonia who was admitted on 8/2. Patient remains intubated and sedated.status post tracheostomy, pending PEG tube placement  SIGNIFICANT EVENTS  Patient had a CT scan of head which was negative for acute bleed, CT scan of abdomen which was negative for retroperitoneal hematoma, was transfused and is on weaning doses of Neo-Synephrine. Fentanyl infusion was started last night for some agitation. Has been seen and followed by neurology and is on Seroquel and Klonopin.  BP 106/70 (BP Location: Left Arm)   Pulse (!) 101   Temp 98.4 F (36.9 C) (Axillary)   Resp 15   Ht 5\' 9"  (1.753 m)   Wt 62.5 kg   SpO2 96%   BMI 20.35 kg/m    PHYSICAL EXAMINATION:  GENERAL:critically ill appearing, Still with persistent agitation HEAD: Normocephalic, atraumatic.  EYES: Pupils equal, round, reactive to light.  No scleral icterus.  MOUTH: Moist mucosal membrane. NECK: tracheostomy in place no bleeding noted PULMONARY: rhonchi appreciated on the left CARDIOVASCULAR: S1 and S2. Regular rate and rhythm. No murmurs, rubs, or gallops. 2+ dorsalis pedis pulses bilaterally GASTROINTESTINAL: Soft, nontender, -distended. No masses. Positive bowel sounds. No hepatosplenomegaly.  MUSCULOSKELETAL: No swelling, clubbing, or edema.  NEUROLOGIC: obtunded, GCS<8 SKIN:intact,warm,dry  ASSESSMENT AND PLAN SYNOPSIS  Status post inferior wall myocardial infarction, status post PCI/drug-eluting stent 2. Peak troponin thus far is 37.74. Patient is presently on apixaban aspirin, plavix  Hypotension. Patient remains on Neo-Synephrine at tapering doses   Severe agitation.negative CT scan of the head, appreciate neurology input, on Seroquel and Klonopin, is on a fentanyl drip at this time  Severe Hypoxic Respiratory Failure secondary to aspiration  pneumonia. Patient grew staph aureus, sensitive to tetracycline. Will complete a full course  DVT and SVT of right upper arm. Will convert to apixaban  Hypokalemia we'll replace  Anemia. Last hemoglobin check was 8.9  Renal insufficiency. Prerenal indices  Hermelinda Dellen, DO  Patient ID: JAYDENN BOCCIO, male   DOB: 10-31-1940, 77 y.o.   MRN: [id number] Patient ID: ROARKE MARCIANO, male   DOB: [birth year], 77 y.o.   MRN: [id number]  Patient ID: BEUFORD GARCILAZO, male   DOB: 1941/02/03, 77 y.o.   MRN: [id number] Patient ID: MARGARITA BOBROWSKI, male   DOB: Jun 02, 1940, 77 y.o.   MRN: [id number] Patient ID: HISHAM PROVENCE, male   DOB: 12/04/1940, 77 y.o.   MRN: [id number] Patient ID: ERMAL HABERER, male   DOB: 1941/01/16, 77 y.o.   MRN: [id number] Patient ID: DOMINGOS RIGGI, male   DOB: 1940/10/11, 77 y.o.   MRN: [id number] Patient ID: MERYL PONDER, male   DOB: October 04, 1940, 77 y.o.   MRN: [id number]

## 2017-12-20 NOTE — Progress Notes (Signed)
Inpatient Diabetes Program Recommendations  AACE/ADA: New Consensus Statement on Inpatient Glycemic Control (2015)  Target Ranges:  Prepandial:   less than 140 mg/dL      Peak postprandial:   less than 180 mg/dL (1-2 hours)      Critically ill patients:  140 - 180 mg/dL   Results for LEALAND, ELTING (MRN [id number]) as of 12/20/2017 07:33  Ref. Range 12/19/2017 00:19 12/19/2017 03:53 12/19/2017 07:38 12/19/2017 11:51 12/19/2017 12:11 12/19/2017 13:20 12/19/2017 16:07 12/19/2017 16:40 12/19/2017 18:48  Glucose-Capillary Latest Ref Range: 70 - 99 mg/dL 91 96 76 40 (LL) 41 (LL) 105 (H) 65 (L) 67 (L) 86   Results for TARRIN, LEBOW (MRN [id number]) as of 12/20/2017 07:33  Ref. Range 12/19/2017 23:24 12/20/2017 03:48 12/20/2017 07:25  Glucose-Capillary Latest Ref Range: 70 - 99 mg/dL 134 (H) 168 (H) 140 (H)    Current Orders: Lantus 10 units QHS                            Novolog Moderate Correction Scale/ SSI (0-15 units) Q4 hours       Hypoglycemic at 12pm yesterday and again at 4pm yesterday.  No Diabetes medications at home.  Lantus HELD last PM per RN documentation in the [facility].     MD- Please consider discontinuation of Lantus for now      --Will follow patient during hospitalization--  Wyn Quaker RN, MSN, CDE Diabetes Coordinator Inpatient Glycemic Control Team Team Pager: [phone] (8a-5p)

## 2017-12-20 NOTE — Progress Notes (Signed)
[facility] [place] NAME: Marc [name]    MR#:  [id number]  DATE OF BIRTH:  [birth year]  SUBJECTIVE:  CHIEF COMPLAINT:   Chief Complaint  Patient presents with  . Weakness  . Dizziness  The patient still has hypotension, on dopamine drip. REVIEW OF SYSTEMS:   Review of Systems  Unable to perform ROS: Medical condition    DRUG ALLERGIES:   Allergies  Allergen Reactions  . Glimepiride   . Tape Itching and Rash    VITALS:  Blood pressure 97/66, pulse (!) 114, temperature 98.4 F (36.9 C), temperature source Axillary, resp. rate 15, height 5\' 9"  (1.753 m), weight 62.5 kg, SpO2 91 %.  PHYSICAL EXAMINATION:  GENERAL:  77 y.o.-year-old patient lying in the bed, on ventilation via trach EYES: Pupils equal, round, reactive to light and accommodation. No scleral icterus. Extraocular muscles intact.  HEENT: Head atraumatic, normocephalic.   NECK:  Supple, no jugular venous distention. No thyroid enlargement, trachea in situ. LUNGS: Normal breath sounds bilaterally, no wheezing, rales,rhonchi or crepitation. No use of accessory muscles of respiration.  CARDIOVASCULAR: S1, S2 normal. No murmurs, rubs, or gallops.  ABDOMEN: Soft, nontender, nondistended. Bowel sounds present.  PEG in situ.   EXTREMITIES: No pedal edema, cyanosis, or clubbing.  NEUROLOGIC: Unable to exam..  PSYCHIATRIC: The patient is sedated.  He is agitated upon stimuli. SKIN: No obvious rash, lesion, or ulcer.   Physical Exam LABORATORY PANEL:   CBC Recent Labs  Lab 12/20/17 0412  WBC 6.0  HGB 8.9*  HCT 26.0*  PLT 261   ------------------------------------------------------------------------------------------------------------------  Chemistries  Recent Labs  Lab 12/18/17 0050  12/20/17 0412  NA 139   < > 141  K 3.8   < > 3.5  CL 101   < > 107  CO2 31   < > 29  GLUCOSE 187*   < > 153*  BUN 19   < > 26*  CREATININE 0.94   < > 0.99  CALCIUM 8.0*   < >  7.8*  MG 1.6*   < > 1.7  AST 31  --   --   ALT 35  --   --   ALKPHOS 135*  --   --   BILITOT 1.4*  --   --    < > = values in this interval not displayed.   ------------------------------------------------------------------------------------------------------------------  Cardiac Enzymes Recent Labs  Lab 12/18/17 1828 12/19/17 0021  TROPONINI 8.26* 37.74*   ------------------------------------------------------------------------------------------------------------------  RADIOLOGY:  Ct Abdomen Pelvis Wo Contrast  Result Date: 12/19/2017 CLINICAL DATA:  Abdominal distention. Severe sepsis. Recent PEG placement. EXAM: CT ABDOMEN AND PELVIS WITHOUT CONTRAST TECHNIQUE: Multidetector CT imaging of the abdomen and pelvis was performed following the standard protocol without IV contrast. COMPARISON:  Abdominal x-rays from same day. CT abdomen pelvis dated August 06, 2017. FINDINGS: Lower chest: Moderate bilateral pleural effusions with left greater than right lower lobe collapse. Hepatobiliary: No focal liver abnormality is seen. Status post cholecystectomy. No biliary dilatation. Pancreas: Unremarkable. No pancreatic ductal dilatation or surrounding inflammatory changes. Spleen: Normal in size without focal abnormality. Adrenals/Urinary Tract: The adrenal glands are unremarkable. Persistent enhancement of the bilateral kidneys with a small amount of excreted contrast in the collecting systems, ureters, and bladder. The bladder is decompressed by Foley catheter. Stomach/Bowel: Interval placement of a PEG tube in appropriate position. No bowel wall thickening, distention, or surrounding inflammatory changes. Left-sided colonic diverticulosis. Vascular/Lymphatic: Aortic atherosclerosis. No enlarged abdominal or pelvic  lymph nodes. Reproductive: Unchanged prostatomegaly. Other: Moderate pneumoperitoneum. Small pelvic ascites. Prominent presacral stranding and fluid. Mild anasarca. No hematoma.  Musculoskeletal: No acute or significant osseous findings. IMPRESSION: 1. Moderate pneumoperitoneum. This is felt to be related to recent PEG placement, as there are no bowel inflammatory changes to suggest perforation. 2. Persistent enhancement of the bilateral kidneys, consistent with acute kidney injury/ATN. 3. Moderate bilateral pleural effusions with left greater than right lower lobe collapse. 4.  Aortic atherosclerosis (ICD10-I70.0). Electronically Signed   By: Titus Dubin M.D.   On: 12/19/2017 11:12   Dg Abd 1 View  Result Date: 12/19/2017 CLINICAL DATA:  Abdominal swelling. EXAM: ABDOMEN - 1 VIEW COMPARISON:  12/06/2017 FINDINGS: Signs of pneumoperitoneum (visible inferior diaphragmatic surface and right inferior liver surface). Patient had PEG tube placement 2 days ago. The tube is in expected position over the left upper quadrant. Bowel gas pattern is normal. A call has been placed to the ordering provider. IMPRESSION: 1. Pneumoperitoneum in this patient with PEG placement 2 days ago. 2. Chart history of dropping hemoglobin, detection of postoperative bleeding would require abdominal CT (contrast not essential). Electronically Signed   By: Monte Fantasia M.D.   On: 12/19/2017 09:21   Ct Head Wo Contrast  Result Date: 12/19/2017 CLINICAL DATA:  Dysphagia/dysphonia within cephalopathy EXAM: CT HEAD WITHOUT CONTRAST TECHNIQUE: Contiguous axial images were obtained from the base of the skull through the vertex without intravenous contrast. COMPARISON:  December 10, 2017 FINDINGS: Brain: There is age related volume loss. There is no intracranial mass, hemorrhage, extra-axial fluid collection, or midline shift. There is mild small vessel disease in the centra semiovale bilaterally. There is also apparent small vessel disease in the mid pons in the basilar perforator distribution. No acute appearing infarct is evident. Vascular: No hyperdense vessel. There are foci of calcification in each distal  vertebral artery and carotid siphon region. Skull: The bony calvarium appears intact. Sinuses/Orbits: There is mucosal thickening in several ethmoid air cells. There is a small retention cyst in the posterior right maxillary antrum. Orbits appear symmetric bilaterally. Other: There is opacification in several mastoid air cells bilaterally. IMPRESSION: Age related volume loss with patchy periventricular and pontine small vessel disease. No acute infarct evident. No mass or hemorrhage. There are foci of arterial vascular calcification. There are areas of paranasal sinus and mastoid disease. Electronically Signed   By: Lowella Grip III M.D.   On: 12/19/2017 10:56   Dg Chest Port 1 View  Result Date: 12/19/2017 CLINICAL DATA:  Tracheostomy EXAM: PORTABLE CHEST 1 VIEW COMPARISON:  Chest radiograph from one day prior. FINDINGS: Tracheostomy tube tip overlies the tracheal air column at the thoracic inlet. Right PICC position is stable with coiled tip in the upper mediastinum probably within the left brachiocephalic vein. Stable cardiomediastinal silhouette with normal heart size. No pneumothorax. Stable small bilateral pleural effusions. Lung volumes are slightly decreased. Patchy opacities throughout both lungs appear slightly worsened at the lung bases. IMPRESSION: 1. Stable malpositioned right PICC with coiling of the tip in the upper mediastinum probably within the left brachiocephalic vein. 2. Well-positioned tracheostomy tube. 3. Patchy opacities throughout both lungs appear slightly worsened at the lung bases. 4. Stable small bilateral pleural effusions. Electronically Signed   By: Ilona Sorrel M.D.   On: 12/19/2017 09:21    ASSESSMENT AND PLAN:  77 year old male with past medical history significant for hypertension, hyperlipidemia and recent diagnosis of H. pylori gastritis on 11/27/2017 presents to hospital from home secondary to weakness,  confusion.  1. Acute respiratory failure- Initially  intubated for possible aspiration, extubated however reintubated on 12/09/2017. -Chest x-Eisa today with left-sided opacification and increased mucus plugging. Status post bronchoscopy and improvement in ventilation. -Vent management per intensivist. S/p Trach, S/P PEG -Antibiotics changed to vancomycin due to MRSA in sputum, completed tetracycline.  2. Acute encephalopathy-appreciate neurology consult. Initially considered to be secondary to PRES syndrome -MRI of the brain showing mild volume loss and chronic ischemic changes, no acute findings. -Lumbar puncture was negative -Patient still encephalopathic. MRI of the lumbar spine and thoracic spine ordered for lower extremity weakness and repeat CT of the head -Also myasthenia gravis labs, labs for para-neoplastic syndrome ordered On Precedex.   Per Dr. Irish Elders, started nightly Seroquel at 25 and if doesn't help increase to 50 nightly.   3. H. pylori gastritis-diagnosed with biopsy from recent endoscopy about 2 weeks ago. -Continue treatment with Protonix, amoxicillin and clarithromycin  4. Hypertension-labile blood pressures. Hold metoprolol and Norvasc due to hypotension.  5. Right upper extremity DVT-has a PICC line currently in that arm. -Nonocclusive DVT in the right subclavian and an occlusive DVT in the right cephalic vein seen. He was on heparin drip.  Changed to Eliquis.  STEMI. Status posterior cardiac cath and PCI.  He was on Angiomax, started aspirin and Plavix and Lipitor per Dr. Clayborn Bigness.  Hypotension.  Continue dopamine drip.  Critically ill, guarded prognosis LTAC placement  I discussed with Dr. Jefferson Fuel. All the records are reviewed and case discussed with Care Management/Social Workerr. Management plans discussed with the patient's wife and his son and they are in agreement.  CODE STATUS: full  TOTAL TIME TAKING CARE OF THIS PATIENT: 37 minutes.   POSSIBLE D/C IN ? DAYS, DEPENDING ON CLINICAL  CONDITION.   [name] M.D on 12/20/2017   Between 7am to 6pm - Pager - [phone]  After 6pm go to [email or web address] - password [email or web address] [facility]  Office  [phone]  CC: Primary care physician; Tracie Harrier, MD  Note: This dictation was prepared with Dragon dictation along with smaller phrase technology. Any transcriptional errors that result from this process are unintentional.

## 2017-12-20 NOTE — Care Management (Signed)
RNCM spoke to patient's wife in passing this morning. She expressed that she was glad that patient did not go to LTAC (because of heart issue that recently occurred).

## 2017-12-20 NOTE — Progress Notes (Signed)
Spoke with Dr. Tami Ribas who gave verbal order to remove sutures from trach

## 2017-12-20 NOTE — Progress Notes (Addendum)
Pharmacy Electrolyte Monitoring Consult:  Pharmacy consulted to assist in monitoring and replacing electrolytes in this 77 y.o. male admitted on 11/29/2017 with Weakness and Dizziness  Patient received tracheostomy on 8/16. PEG tube placed 8/21. Patient is currently sedated on propofol.  Labs:  Sodium (mmol/L)  Date Value  12/20/2017 141   Potassium (mmol/L)  Date Value  12/20/2017 3.5   Magnesium (mg/dL)  Date Value  12/20/2017 1.7   Phosphorus (mg/dL)  Date Value  12/19/2017 3.9   Calcium (mg/dL)  Date Value  12/20/2017 7.8 (L)   Albumin (g/dL)  Date Value  12/18/2017 2.2 (L)   Corrected Calcium: ~ 9.4  Assessment/Plan: KCL 36mEq x 1 dose per tube. Magnesium 2g IV x 1 dose ordered.  Goal magnesium ~ 2, Goal potassium  ~ 4.   Will recheck electrolytes with am labs.  Pharmacy will continue to monitor and adjust per consult.   Marc [name], PharmD, BCPS Clinical Pharmacist 12/20/2017 7:23 AM

## 2017-12-21 DIAGNOSIS — R410 Disorientation, unspecified: Secondary | ICD-10-CM

## 2017-12-21 DIAGNOSIS — I213 ST elevation (STEMI) myocardial infarction of unspecified site: Secondary | ICD-10-CM

## 2017-12-21 LAB — BASIC METABOLIC PANEL
Anion gap: 6 (ref 5–15)
BUN: 28 mg/dL — AB (ref 8–23)
CHLORIDE: 105 mmol/L (ref 98–111)
CO2: 30 mmol/L (ref 22–32)
CREATININE: 0.9 mg/dL (ref 0.61–1.24)
Calcium: 8 mg/dL — ABNORMAL LOW (ref 8.9–10.3)
GFR calc Af Amer: >60 mL/min (ref >60)
GFR calc non Af Amer: >60 mL/min (ref >60)
Glucose, Bld: 171 mg/dL — ABNORMAL HIGH (ref 70–99)
Potassium: 4.4 mmol/L (ref 3.5–5.1)
SODIUM: 141 mmol/L (ref 135–145)

## 2017-12-21 LAB — TROPONIN I: Troponin I: 8.15 ng/mL (ref <0.03)

## 2017-12-21 LAB — GLUCOSE, CAPILLARY
Glucose-Capillary: 173 mg/dL — ABNORMAL HIGH (ref 70–99)
Glucose-Capillary: 157 mg/dL — ABNORMAL HIGH (ref 70–99)
Glucose-Capillary: 135 mg/dL — ABNORMAL HIGH (ref 70–99)
Glucose-Capillary: 171 mg/dL — ABNORMAL HIGH (ref 70–99)
Glucose-Capillary: 211 mg/dL — ABNORMAL HIGH (ref 70–99)
Glucose-Capillary: 257 mg/dL — ABNORMAL HIGH (ref 70–99)

## 2017-12-21 LAB — MAGNESIUM: Magnesium: 1.9 mg/dL (ref 1.7–2.4)

## 2017-12-21 MED ORDER — FREE WATER
200.0000 mL | Freq: Three times a day (TID) | Status: DC
  Administered 2017-12-21 – 2017-12-22 (×3): 200 mL

## 2017-12-21 MED ORDER — ATORVASTATIN CALCIUM 20 MG PO TABS
80.0000 mg | ORAL_TABLET | Freq: Every day | ORAL | Status: DC
  Administered 2017-12-21 – 2017-12-24 (×4): 80 mg
  Filled 2017-12-21 (×4): qty 4

## 2017-12-21 MED ORDER — CLONAZEPAM 1 MG PO TABS
1.0000 mg | ORAL_TABLET | Freq: Two times a day (BID) | ORAL | Status: DC
  Administered 2017-12-21 – 2017-12-22 (×2): 1 mg
  Filled 2017-12-21 (×2): qty 1

## 2017-12-21 MED ORDER — FENTANYL 75 MCG/HR TD PT72
75.0000 ug | MEDICATED_PATCH | TRANSDERMAL | Status: DC
  Administered 2017-12-21 – 2017-12-27 (×3): 75 ug via TRANSDERMAL
  Filled 2017-12-21 (×3): qty 1

## 2017-12-21 MED ORDER — ASPIRIN 81 MG PO CHEW
81.0000 mg | CHEWABLE_TABLET | Freq: Every day | ORAL | Status: DC
  Administered 2017-12-22 – 2017-12-24 (×3): 81 mg
  Filled 2017-12-21 (×3): qty 1

## 2017-12-21 MED ORDER — QUETIAPINE FUMARATE 25 MG PO TABS
100.0000 mg | ORAL_TABLET | Freq: Every day | ORAL | Status: DC
  Administered 2017-12-21 – 2017-12-27 (×7): 100 mg via ORAL
  Filled 2017-12-21 (×7): qty 4

## 2017-12-21 MED ORDER — AMIODARONE IV BOLUS ONLY 150 MG/100ML
INTRAVENOUS | Status: AC
  Filled 2017-12-21: qty 100

## 2017-12-21 MED ORDER — AMIODARONE HCL IN DEXTROSE 360-4.14 MG/200ML-% IV SOLN
60.0000 mg/h | INTRAVENOUS | Status: AC
  Administered 2017-12-21: 60 mg/h via INTRAVENOUS
  Filled 2017-12-21: qty 200

## 2017-12-21 MED ORDER — AMIODARONE HCL IN DEXTROSE 360-4.14 MG/200ML-% IV SOLN
30.0000 mg/h | INTRAVENOUS | Status: DC
  Administered 2017-12-21: 30 mg/h via INTRAVENOUS
  Filled 2017-12-21: qty 200

## 2017-12-21 MED ORDER — FAMOTIDINE 20 MG PO TABS
20.0000 mg | ORAL_TABLET | Freq: Two times a day (BID) | ORAL | Status: DC
  Administered 2017-12-21 – 2017-12-24 (×8): 20 mg
  Filled 2017-12-21 (×8): qty 1

## 2017-12-21 MED ORDER — CLOPIDOGREL BISULFATE 75 MG PO TABS
75.0000 mg | ORAL_TABLET | Freq: Every day | ORAL | Status: DC
  Administered 2017-12-22 – 2017-12-24 (×4): 75 mg
  Filled 2017-12-21 (×2): qty 1

## 2017-12-21 MED ORDER — CHLORHEXIDINE GLUCONATE 0.12 % MT SOLN
OROMUCOSAL | Status: AC
  Filled 2017-12-21: qty 15

## 2017-12-21 MED ORDER — FENTANYL CITRATE (PF) 100 MCG/2ML IJ SOLN
25.0000 ug | INTRAMUSCULAR | Status: DC | PRN
  Administered 2017-12-21 – 2017-12-27 (×7): 50 ug via INTRAVENOUS
  Filled 2017-12-21 (×8): qty 2

## 2017-12-21 MED ORDER — AMIODARONE LOAD VIA INFUSION
150.0000 mg | Freq: Once | INTRAVENOUS | Status: AC
  Administered 2017-12-21: 150 mg via INTRAVENOUS
  Filled 2017-12-21: qty 83.34

## 2017-12-21 MED ORDER — MAGNESIUM SULFATE 2 GM/50ML IV SOLN
2.0000 g | Freq: Once | INTRAVENOUS | Status: AC
  Administered 2017-12-21: 2 g via INTRAVENOUS
  Filled 2017-12-21: qty 50

## 2017-12-21 NOTE — Progress Notes (Signed)
Pharmacy to MD Communication  Pharmacy was consulted to review medications for possible drug drug interactions with amiodarone. Patient was noted to have several interactions as listed below.   Category X: amiodarone and quetiapine. QTc prolongation. Patient is being monitored via telemetry. No changes needed.   Category D: ondansetron and amiodarone. QTc prolongation. Ondansetron is PRN and patient is being monitored. No changes needed.   Category C: amiodarone with duoneb, atorvastatin, clopidogrel, metoprolol. Non-significant interactions noted.Patient is being monitored. No changes needed.    Pharmacy will continue to follow.   Lendon Ka, PharmD

## 2017-12-21 NOTE — Progress Notes (Signed)
Pharmacy Electrolyte Monitoring Consult:  Pharmacy consulted to assist in monitoring and replacing electrolytes in this 77 y.o. male admitted on 11/29/2017 with Weakness and Dizziness  Patient received tracheostomy on 8/16. PEG tube placed 8/21. Patient is currently sedated on propofol.  Labs:  Sodium (mmol/L)  Date Value  12/21/2017 141   Potassium (mmol/L)  Date Value  12/21/2017 4.4   Magnesium (mg/dL)  Date Value  12/21/2017 1.9   Phosphorus (mg/dL)  Date Value  12/19/2017 3.9   Calcium (mg/dL)  Date Value  12/21/2017 8.0 (L)   Albumin (g/dL)  Date Value  12/18/2017 2.2 (L)   Corrected Calcium: ~ 9.4  Assessment/Plan: Magnesium 2g IV x 1 dose ordered.  Goal magnesium ~ 2, Goal potassium  ~ 4.   Will recheck electrolytes with am labs.  Pharmacy will continue to monitor and adjust per consult.   Ramond Dial, PharmD, BCPS Clinical Pharmacist 12/21/2017 7:30 AM

## 2017-12-21 NOTE — Progress Notes (Signed)
PT PROFILE: 77 year old male with acute encephalopathy, sepsis, pneumonia, and dysphagia/dysphonia who was admitted on 8/2. Patient remains intubated and sedated.status post tracheostomy, pending PEG tube placement  SUBJ: Sedated, on vent (full support).  Intermittent appearance of agitation.  Not following commands.  Vitals:   12/21/17 1030 12/21/17 1100 12/21/17 1130 12/21/17 1200  BP: 103/69 99/62 110/68 110/69  Pulse:  99 (!) 108 (!) 115  Resp: 15 15 17 16   Temp:    99.5 F (37.5 C)  TempSrc:    Oral  SpO2:  98% 99% 96%  Weight:      Height:       Vent Mode: PRVC FiO2 (%):  [40 %] 40 % Set Rate:  [15 bmp] 15 bmp Vt Set:  [500 mL] 500 mL PEEP:  [5 cmH20] 5 cmH20 Pressure Support:  [15 cmH20] 15 cmH20 Plateau Pressure:  [17 cmH20-22 cmH20] 21 cmH20  . sodium chloride 10 mL/hr at 12/21/17 1024  . sodium chloride    . fentaNYL infusion INTRAVENOUS 75 mcg/hr (12/21/17 1024)  . phenylephrine (NEO-SYNEPHRINE) Adult infusion 70 mcg/min (12/21/17 1024)   Sedated, intermittently agitated, not following commands HEENT: NCAT, sclerae white Neck: Trach site clean Chest: Mild rhonchi, no wheezes Cardiac: Tachycardia, regular, no M Abdomen: Soft, NT, NABS, gastrostomy tube site clean Extremities: Trace symmetric pretibial edema Neuro: CNs intact, moves all extremities  BMP Latest Ref Rng & Units 12/21/2017 12/20/2017 12/19/2017  Glucose 70 - 99 mg/dL 171(H) 153(H) 98  BUN 8 - 23 mg/dL 28(H) 26(H) 20  Creatinine 0.61 - 1.24 mg/dL 0.90 0.99 1.07  Sodium 135 - 145 mmol/L 141 141 139  Potassium 3.5 - 5.1 mmol/L 4.4 3.5 3.4(L)  Chloride 98 - 111 mmol/L 105 107 105  CO2 22 - 32 mmol/L 30 29 26   Calcium 8.9 - 10.3 mg/dL 8.0(L) 7.8(L) 7.9(L)    CBC Latest Ref Rng & Units 12/20/2017 12/20/2017 12/19/2017  WBC 3.8 - 10.6 K/uL 6.0 - 7.8  Hemoglobin 13.0 - 18.0 g/dL 8.9(L) 8.5(L) 7.8(L)  Hematocrit 40.0 - 52.0 % 26.0(L) 25.4(L) 23.2(L)  Platelets 150 - 440 K/uL 261 - 336    CXR: No new  film.  Last chest x-Carlyn 8/22 consistent with edema and bilateral pleural effusions  IMPRESSION: Prolonged ventilator dependent respiratory failure Status post reintubation 8/12 STEMI 8/22 Hypotension, multifactorial AKI, resolved Hyperglycemia due to critical illness MRSA pneumonia, treated ICU acquired anemia Acute encephalopathy Concern for progressive neuromuscular disorder ICU/ventilator associated discomfort  PLAN/REC: Cont vent support - settings reviewed and/or adjusted Wean in PSV mode as able Cont vent bundle Daily SBT if/when meets criteria Continue nebulized bronchodilators as needed Wean phenylephrine to off as tolerated for MAP goal >65 mmHg Continue aspirin, atorvastatin, clopidogrel, apixaban for coronary disease Monitor BMET intermittently Monitor I/Os Correct electrolytes as indicated  SUP: Enteral famotidine Continue TF protocol Continue sliding scale insulin DVT px: Apixaban  Monitor CBC intermittently Transfuse per usual guidelines Continue quetiapine, clonazepam Initiate Duragesic patch 8/24 and transition off fentanyl infusion  Wife updated at bedside  Merton Border, MD PCCM service Mobile [phone] Pager [phone] 12/21/2017 12:52 PM

## 2017-12-21 NOTE — Progress Notes (Signed)
[facility] [place] NAME: Marc [name]    MR#:  [id number]  DATE OF BIRTH:  07-29-1940  SUBJECTIVE:   patient intubated  REVIEW OF SYSTEMS:    Intubated Lurline Idol    Tolerating Diet: PEG      DRUG ALLERGIES:   Allergies  Allergen Reactions  . Glimepiride   . Tape Itching and Rash    VITALS:  Blood pressure 110/69, pulse (!) 115, temperature 99.5 F (37.5 C), temperature source Oral, resp. rate 16, height 5\' 9"  (1.753 m), weight 62.5 kg, SpO2 96 %.  PHYSICAL EXAMINATION:  Constitutional: Appears ill-appearing  HENT: Normocephalic. .  Tracheostomy placed   eyes: Conjunctivae are normal. PERRLA, no scleral icterus.  Neck: Normal ROM. Neck supple. No JVD. No tracheal deviation. CVS: RRR, S1/S2 +, no murmurs, no gallops, no carotid bruit.  Pulmonary: Effort and breath sounds normal, no stridor, rhonchi, wheezes, rales.  Abdominal: Soft. BS +,  no distension, tenderness, rebound or guarding.  Musculoskeletal: Mild lower extremity edema  neuro: Sedated Skin: Skin is warm and dry. No rash noted. Psychiatric: Normal mood and affect.      LABORATORY PANEL:   CBC Recent Labs  Lab 12/20/17 0412  WBC 6.0  HGB 8.9*  HCT 26.0*  PLT 261   ------------------------------------------------------------------------------------------------------------------  Chemistries  Recent Labs  Lab 12/18/17 0050  12/21/17 0639  NA 139   < > 141  K 3.8   < > 4.4  CL 101   < > 105  CO2 31   < > 30  GLUCOSE 187*   < > 171*  BUN 19   < > 28*  CREATININE 0.94   < > 0.90  CALCIUM 8.0*   < > 8.0*  MG 1.6*   < > 1.9  AST 31  --   --   ALT 35  --   --   ALKPHOS 135*  --   --   BILITOT 1.4*  --   --    < > = values in this interval not displayed.   ------------------------------------------------------------------------------------------------------------------  Cardiac Enzymes Recent Labs  Lab 12/18/17 1429 12/18/17 1828  12/19/17 0021  TROPONINI 18.40* 8.26* 37.74*   ------------------------------------------------------------------------------------------------------------------  RADIOLOGY:  No results found.   ASSESSMENT AND PLAN:   77 year old male admitted August 2 for acute encephalopathy and sepsis.   1.  Acute hypoxic respiratory failure: Patient initially intubated for possible aspiration pneumonia.  He was extubated however reintubated on August 12.  Patient is status post tracheostomy and PEG placement. Vent management as per intensivist   2.  Aspiration pneumonia: Staphylococcus aureus grew from cultures Patient completed treatment    3.  Non-STEMI: Patient is status post inferior wall myocardial infarction status post drug-eluting stent  Continue aspirin, apixaban, Plavix, statin  4.  DVT and SVT of right upper arm on apixaban  5.  Anemia of chronic disease with stable hemoglobin  6.  Hypotension: Wean pressors as tolerated  7.  Agitation with negative CT scan of the head Neurology consultation appreciated Continue Seroquel and Klonopin Plan to start fentanyl patch  8.  Diabetes: Continue current management with ADA diet and Lantus 9. NUTR: PEG feedings  Management plans discussed with the patient's wife and she is in agreement.  CODE STATUS: FULL  TOTAL TIME TAKING CARE OF THIS PATIENT: 20 minutes.     POSSIBLE D/C ?/, DEPENDING ON CLINICAL CONDITION.   Luiza Carranco M.D on 12/21/2017 at 12:28 PM  Between 7am to 6pm - Pager - [phone] After 6pm go to [email or web address] - password [email or web address] [facility]  Office  [phone]  CC: Primary care physician; Tracie Harrier, MD  Note: This dictation was prepared with Dragon dictation along with smaller phrase technology. Any transcriptional errors that result from this process are unintentional.

## 2017-12-22 ENCOUNTER — Inpatient Hospital Stay: Payer: PPO

## 2017-12-22 DIAGNOSIS — I4891 Unspecified atrial fibrillation: Secondary | ICD-10-CM

## 2017-12-22 LAB — CBC
HCT: 28.7 % — ABNORMAL LOW (ref 40.0–52.0)
Hemoglobin: 9.6 g/dL — ABNORMAL LOW (ref 13.0–18.0)
MCH: 33 pg (ref 26.0–34.0)
MCHC: 33.5 g/dL (ref 32.0–36.0)
MCV: 98.4 fL (ref 80.0–100.0)
PLATELETS: 241 10*3/uL (ref 150–440)
RBC: 2.92 MIL/uL — ABNORMAL LOW (ref 4.40–5.90)
RDW: 15.8 % — AB (ref 11.5–14.5)
WBC: 9.3 10*3/uL (ref 3.8–10.6)

## 2017-12-22 LAB — GLUCOSE, CAPILLARY
GLUCOSE-CAPILLARY: 311 mg/dL — AB (ref 70–99)
GLUCOSE-CAPILLARY: 247 mg/dL — AB (ref 70–99)
GLUCOSE-CAPILLARY: 256 mg/dL — AB (ref 70–99)
GLUCOSE-CAPILLARY: 245 mg/dL — AB (ref 70–99)
GLUCOSE-CAPILLARY: 249 mg/dL — AB (ref 70–99)
GLUCOSE-CAPILLARY: 212 mg/dL — AB (ref 70–99)
GLUCOSE-CAPILLARY: 145 mg/dL — AB (ref 70–99)
Glucose-Capillary: 137 mg/dL — ABNORMAL HIGH (ref 70–99)
Glucose-Capillary: 153 mg/dL — ABNORMAL HIGH (ref 70–99)
Glucose-Capillary: 155 mg/dL — ABNORMAL HIGH (ref 70–99)
Glucose-Capillary: 179 mg/dL — ABNORMAL HIGH (ref 70–99)
Glucose-Capillary: 250 mg/dL — ABNORMAL HIGH (ref 70–99)
Glucose-Capillary: 322 mg/dL — ABNORMAL HIGH (ref 70–99)

## 2017-12-22 LAB — COMPREHENSIVE METABOLIC PANEL
ALT: 40 U/L (ref 0–44)
AST: 35 U/L (ref 15–41)
Albumin: 2.1 g/dL — ABNORMAL LOW (ref 3.5–5.0)
Alkaline Phosphatase: 210 U/L — ABNORMAL HIGH (ref 38–126)
Anion gap: 8 (ref 5–15)
BILIRUBIN TOTAL: 0.9 mg/dL (ref 0.3–1.2)
BUN: 31 mg/dL — ABNORMAL HIGH (ref 8–23)
CHLORIDE: 102 mmol/L (ref 98–111)
CO2: 25 mmol/L (ref 22–32)
CREATININE: 0.93 mg/dL (ref 0.61–1.24)
Calcium: 8 mg/dL — ABNORMAL LOW (ref 8.9–10.3)
Glucose, Bld: 314 mg/dL — ABNORMAL HIGH (ref 70–99)
POTASSIUM: 3.8 mmol/L (ref 3.5–5.1)
Sodium: 135 mmol/L (ref 135–145)
TOTAL PROTEIN: 5.5 g/dL — AB (ref 6.5–8.1)

## 2017-12-22 LAB — TROPONIN I: TROPONIN I: 5.84 ng/mL — AB (ref <0.03)

## 2017-12-22 LAB — MAGNESIUM: Magnesium: 1.9 mg/dL (ref 1.7–2.4)

## 2017-12-22 MED ORDER — CLONAZEPAM 1 MG PO TABS
1.0000 mg | ORAL_TABLET | Freq: Every day | ORAL | Status: DC
  Administered 2017-12-23 – 2017-12-24 (×2): 1 mg
  Filled 2017-12-22 (×2): qty 1

## 2017-12-22 MED ORDER — FREE WATER
100.0000 mL | Freq: Three times a day (TID) | Status: DC
  Administered 2017-12-22 – 2017-12-28 (×15): 100 mL

## 2017-12-22 MED ORDER — INSULIN ASPART 100 UNIT/ML ~~LOC~~ SOLN
3.0000 [IU] | SUBCUTANEOUS | Status: DC
  Administered 2017-12-22 – 2017-12-28 (×26): 3 [IU] via SUBCUTANEOUS
  Filled 2017-12-22 (×24): qty 1

## 2017-12-22 MED ORDER — AMIODARONE HCL 200 MG PO TABS
400.0000 mg | ORAL_TABLET | Freq: Two times a day (BID) | ORAL | Status: DC
  Administered 2017-12-22 – 2017-12-28 (×13): 400 mg
  Filled 2017-12-22 (×13): qty 2

## 2017-12-22 MED ORDER — DEXTROSE 50 % IV SOLN: 25.0000 mL | INTRAVENOUS | Status: DC | PRN

## 2017-12-22 MED ORDER — INSULIN GLARGINE 100 UNIT/ML ~~LOC~~ SOLN
15.0000 [IU] | Freq: Every day | SUBCUTANEOUS | Status: DC
  Administered 2017-12-22 – 2017-12-27 (×5): 15 [IU] via SUBCUTANEOUS
  Filled 2017-12-22 (×7): qty 0.15

## 2017-12-22 MED ORDER — FUROSEMIDE 10 MG/ML IJ SOLN
40.0000 mg | Freq: Two times a day (BID) | INTRAMUSCULAR | Status: AC
  Administered 2017-12-22 (×2): 40 mg via INTRAVENOUS
  Filled 2017-12-22 (×2): qty 4

## 2017-12-22 MED ORDER — SODIUM CHLORIDE 0.9 % IV SOLN: INTRAVENOUS | Status: DC

## 2017-12-22 MED ORDER — INSULIN REGULAR BOLUS VIA INFUSION
0.0000 [IU] | Freq: Three times a day (TID) | INTRAVENOUS | Status: DC
  Filled 2017-12-22: qty 10

## 2017-12-22 MED ORDER — POTASSIUM CHLORIDE 20 MEQ/15ML (10%) PO SOLN
40.0000 meq | Freq: Two times a day (BID) | ORAL | Status: AC
  Administered 2017-12-22 (×2): 40 meq
  Filled 2017-12-22 (×2): qty 30

## 2017-12-22 MED ORDER — DEXTROSE-NACL 5-0.45 % IV SOLN: INTRAVENOUS | Status: DC

## 2017-12-22 MED ORDER — INSULIN ASPART 100 UNIT/ML ~~LOC~~ SOLN
0.0000 [IU] | SUBCUTANEOUS | Status: DC
  Administered 2017-12-22: 3 [IU] via SUBCUTANEOUS
  Administered 2017-12-22 – 2017-12-23 (×4): 2 [IU] via SUBCUTANEOUS
  Administered 2017-12-23 (×3): 3 [IU] via SUBCUTANEOUS
  Administered 2017-12-24: 5 [IU] via SUBCUTANEOUS
  Administered 2017-12-24 (×2): 2 [IU] via SUBCUTANEOUS
  Administered 2017-12-24 – 2017-12-25 (×2): 3 [IU] via SUBCUTANEOUS
  Administered 2017-12-25 – 2017-12-26 (×3): 2 [IU] via SUBCUTANEOUS
  Administered 2017-12-26 (×2): 5 [IU] via SUBCUTANEOUS
  Administered 2017-12-26: 3 [IU] via SUBCUTANEOUS
  Administered 2017-12-26 – 2017-12-27 (×2): 2 [IU] via SUBCUTANEOUS
  Administered 2017-12-27 – 2017-12-28 (×5): 3 [IU] via SUBCUTANEOUS
  Filled 2017-12-22 (×24): qty 1

## 2017-12-22 MED ORDER — SODIUM CHLORIDE 0.9 % IV SOLN
INTRAVENOUS | Status: DC
  Administered 2017-12-22: 1.9 [IU]/h via INTRAVENOUS
  Filled 2017-12-22: qty 1

## 2017-12-22 NOTE — Progress Notes (Signed)
[facility] [place] NAME: Mattew Chriswell    MR#:  [id number]  DATE OF BIRTH:  [birth year]  SUBJECTIVE:   patient intubated [facility] unresponsive   REVIEW OF SYSTEMS:    Intubated Lurline Idol    Tolerating Diet: PEG      DRUG ALLERGIES:   Allergies  Allergen Reactions  . Glimepiride   . Tape Itching and Rash    VITALS:  Blood pressure 125/90, pulse 98, temperature 98.3 F (36.8 C), temperature source Oral, resp. rate 19, height 5\' 9"  (1.753 m), weight 62.5 kg, SpO2 93 %.  PHYSICAL EXAMINATION:  Constitutional: Appears ill-appearing no verbal communication HENT: Normocephalic. .  Tracheostomy placed   eyes: Conjunctivae are normal. PERRLA, no scleral icterus.  Neck: Normal ROM. Neck supple. No JVD. No tracheal deviation. CVS: RRR, S1/S2 +, no murmurs, no gallops, no carotid bruit.  Pulmonary: Effort and breath sounds normal, no stridor, rhonchi, wheezes, rales.  Abdominal: Soft. BS +,  no distension, tenderness, rebound or guarding.  Musculoskeletal: Mild lower extremity edema  neuro: Sedated Skin: Skin is warm and dry. No rash noted. Psychiatric: Normal mood and affect.      LABORATORY PANEL:   CBC Recent Labs  Lab 12/22/17 0410  WBC 9.3  HGB 9.6*  HCT 28.7*  PLT 241   ------------------------------------------------------------------------------------------------------------------  Chemistries  Recent Labs  Lab 12/22/17 0410  NA 135  K 3.8  CL 102  CO2 25  GLUCOSE 314*  BUN 31*  CREATININE 0.93  CALCIUM 8.0*  MG 1.9  AST 35  ALT 40  ALKPHOS 210*  BILITOT 0.9   ------------------------------------------------------------------------------------------------------------------  Cardiac Enzymes Recent Labs  Lab 12/19/17 0021 12/21/17 2005 12/22/17 0410  TROPONINI 37.74* 8.15* 5.84*    ------------------------------------------------------------------------------------------------------------------  RADIOLOGY:  Dg Chest Port 1 View  Result Date: 12/22/2017 CLINICAL DATA:  Respiratory failure. EXAM: PORTABLE CHEST 1 VIEW COMPARISON:  Radiographs of December 19, 2017. FINDINGS: Tracheostomy tube is unchanged in position. Stable position of right-sided PICC line with coiling of the tip within the left brachycephalic vein. No pneumothorax is noted. Stable bilateral perihilar and basilar opacities are noted concerning for edema or atelectasis with associated pleural effusions. Bony thorax is unremarkable. IMPRESSION: Stable position of right-sided PICC line which is looped within the left brachycephalic vein. Stable bilateral perihilar and basilar opacities are noted concerning for edema or atelectasis with associated pleural effusions. Electronically Signed   By: Marijo Conception, M.D.   On: 12/22/2017 07:46     ASSESSMENT AND PLAN:   77 year old male admitted August 2 for acute encephalopathy and sepsis.   1.  Acute hypoxic respiratory failure: Patient initially intubated for possible aspiration pneumonia.  He was extubated however reintubated on August 12.  Patient is status post tracheostomy and PEG placement. Vent management as per intensivist   2.  Aspiration pneumonia: Staphylococcus aureus grew from cultures Patient completed treatment    3.  Non-STEMI: Patient is status post inferior wall myocardial infarction status post drug-eluting stent  Continue aspirin, apixaban, Plavix, statin  4.  DVT and SVT of right upper arm on apixaban  5.  Anemia of chronic disease with stable hemoglobin  6.  Hypotension: Weaned off of pressors  7.  Agitation with negative CT scan of the head Neurology consultation appreciated Continue Seroquel and Klonopin Continue fentanyl patch off of infusion as per intensivist  8.  Diabetes: Started on insulin drip due to uncontrolled  blood sugars  9. NUTR: PEG feedings  Management plans discussed with the patient's wife and she is in agreement.  CODE STATUS: FULL  TOTAL TIME TAKING CARE OF THIS PATIENT: 20 minutes.   Discussed with Dr. Alva Garnet and nursing  POSSIBLE D/C ?/, DEPENDING ON CLINICAL CONDITION.   Roselene Gray M.D on 12/22/2017 at 10:58 AM  Between 7am to 6pm - Pager - [phone] After 6pm go to [email or web address] - password [email or web address] [facility]  Office  [phone]  CC: Primary care physician; Tracie Harrier, MD  Note: This dictation was prepared with Dragon dictation along with smaller phrase technology. Any transcriptional errors that result from this process are unintentional.

## 2017-12-22 NOTE — Progress Notes (Signed)
PT PROFILE: 77 year old male with acute encephalopathy, sepsis, pneumonia, and dysphagia/dysphonia who was admitted on 8/2.  Extubated 8/9 but required reintubation 8/12 after presumed aspiration event with brief PEA cardiac arrest.  Now status post tracheostomy/PEG  SUBJ: Off of fentanyl infusion.  More responsive.  Not following commands.   Events yesterday evening and overnight: Amiodarone infusion initiated for AFRVR.  Insulin infusion initiated for worsening hyperglycemia  Vitals:   12/22/17 0900 12/22/17 0930 12/22/17 1000 12/22/17 1100  BP: 123/62  125/90 130/90  Pulse: 92 82 98 (!) 106  Resp: 16 19 19  (!) 29  Temp:      TempSrc:      SpO2: 97% 98% 93% 93%  Weight:      Height:       Vent Mode: PRVC FiO2 (%):  [40 %-50 %] 50 % Set Rate:  [15 bmp] 15 bmp Vt Set:  [500 mL] 500 mL PEEP:  [5 cmH20] 5 cmH20 Plateau Pressure:  [19 cmH20-26 cmH20] 19 cmH20  . sodium chloride 10 mL/hr at 12/22/17 0600  . sodium chloride     More responsive, intermittently agitated, not following commands HEENT: NCAT, sclerae white Neck: Trach site clean Chest: Clear anteriorly Cardiac: IRIR, no M Abdomen: Soft, NT, + BS Extremities: Symmetric pretibial edema Neuro: CNs intact, moves all extremities  BMP Latest Ref Rng & Units 12/22/2017 12/21/2017 12/20/2017  Glucose 70 - 99 mg/dL 314(H) 171(H) 153(H)  BUN 8 - 23 mg/dL 31(H) 28(H) 26(H)  Creatinine 0.61 - 1.24 mg/dL 0.93 0.90 0.99  Sodium 135 - 145 mmol/L 135 141 141  Potassium 3.5 - 5.1 mmol/L 3.8 4.4 3.5  Chloride 98 - 111 mmol/L 102 105 107  CO2 22 - 32 mmol/L 25 30 29   Calcium 8.9 - 10.3 mg/dL 8.0(L) 8.0(L) 7.8(L)    CBC Latest Ref Rng & Units 12/22/2017 12/20/2017 12/20/2017  WBC 3.8 - 10.6 K/uL 9.3 6.0 -  Hemoglobin 13.0 - 18.0 g/dL 9.6(L) 8.9(L) 8.5(L)  Hematocrit 40.0 - 52.0 % 28.7(L) 26.0(L) 25.4(L)  Platelets 150 - 440 K/uL 241 261 -    CXR 8/25: Vascular congestion, bibasilar atelectasis/effusion  IMPRESSION: Prolonged  ventilator dependent respiratory failure STEMI 8/22, S/P LHC with DES to RCA Paroxysmal atrial fibrillation with RVR Hypotension, multifactorial - resolved AKI, resolved Hypervolemia Hyperglycemia requiring insulin infusion.  No prior diagnosis of DM MRSA pneumonia, treated ICU acquired anemia without evidence of acute blood loss Acute encephalopathy on presentation ICU acquired delirium Concern for progressive neuromuscular disorder  PLAN/REC: Cont vent support - settings reviewed and/or adjusted Wean in PSV mode as able Cont vent bundle Continue nebulized bronchodilators as needed Continue aspirin, atorvastatin, clopidogrel, apixaban for coronary disease Transition amiodarone to enteral.  Continue load (400 mg per tube BID) Monitor BMET intermittently Monitor I/Os Correct electrolytes as indicated  Furosemide (with potassium) times two 8/25 SUP: Enteral famotidine Continue TF protocol Transition of insulin infusion to Lantus plus SSI 8/25 DVT px: Apixaban  Monitor CBC intermittently Transfuse per usual guidelines Continue quetiapine at current dose Clonazepam dose decreased to 8/25 Continue Duragesic patch initiated 8/24   Merton Border, MD PCCM service Mobile [phone] Pager [phone] 12/22/2017 11:35 AM

## 2017-12-23 LAB — CULTURE, FUNGUS WITHOUT SMEAR

## 2017-12-23 LAB — COMPREHENSIVE METABOLIC PANEL
ALBUMIN: 2.1 g/dL — AB (ref 3.5–5.0)
ALT: 38 U/L (ref 0–44)
AST: 32 U/L (ref 15–41)
Alkaline Phosphatase: 181 U/L — ABNORMAL HIGH (ref 38–126)
Anion gap: 6 (ref 5–15)
BUN: 35 mg/dL — ABNORMAL HIGH (ref 8–23)
CHLORIDE: 103 mmol/L (ref 98–111)
CO2: 31 mmol/L (ref 22–32)
Calcium: 8.1 mg/dL — ABNORMAL LOW (ref 8.9–10.3)
Creatinine, Ser: 1.06 mg/dL (ref 0.61–1.24)
GFR calc Af Amer: >60 mL/min (ref >60)
GFR calc non Af Amer: >60 mL/min (ref >60)
GLUCOSE: 180 mg/dL — AB (ref 70–99)
POTASSIUM: 3.6 mmol/L (ref 3.5–5.1)
SODIUM: 140 mmol/L (ref 135–145)
Total Bilirubin: 0.6 mg/dL (ref 0.3–1.2)
Total Protein: 5.4 g/dL — ABNORMAL LOW (ref 6.5–8.1)

## 2017-12-23 LAB — GLUCOSE, CAPILLARY
GLUCOSE-CAPILLARY: 162 mg/dL — AB (ref 70–99)
Glucose-Capillary: 138 mg/dL — ABNORMAL HIGH (ref 70–99)
Glucose-Capillary: 127 mg/dL — ABNORMAL HIGH (ref 70–99)
Glucose-Capillary: 160 mg/dL — ABNORMAL HIGH (ref 70–99)
Glucose-Capillary: 152 mg/dL — ABNORMAL HIGH (ref 70–99)
Glucose-Capillary: 130 mg/dL — ABNORMAL HIGH (ref 70–99)

## 2017-12-23 LAB — TROPONIN I: Troponin I: 4.5 ng/mL (ref <0.03)

## 2017-12-23 MED ORDER — ALBUMIN HUMAN 5 % IV SOLN
25.0000 g | Freq: Four times a day (QID) | INTRAVENOUS | Status: AC
  Administered 2017-12-23 (×2): 25 g via INTRAVENOUS
  Filled 2017-12-23 (×2): qty 500

## 2017-12-23 NOTE — Progress Notes (Signed)
[facility] [place] NAME: Marc [name]    MR#:  [id number]  DATE OF BIRTH:  1940-12-17  SUBJECTIVE:   Remains sedated and confused. No acute events overnight.  Wife is at bedise.   REVIEW OF SYSTEMS:   Unobtainable as pt. Is sedated and on Trach.    Intubated Lurline Idol  Tolerating Diet: PEG  DRUG ALLERGIES:   Allergies  Allergen Reactions  . Glimepiride   . Tape Itching and Rash    VITALS:  Blood pressure 123/85, pulse (!) 110, temperature 98.3 F (36.8 C), temperature source Axillary, resp. rate 19, height 5\' 9"  (1.753 m), weight 62.5 kg, SpO2 100 %.  PHYSICAL EXAMINATION:  Constitutional: Appears ill-appearing no verbal communication HENT: Normocephalic. .  Tracheostomy placed  eyes: Conjunctivae are normal. PERRLA, no scleral icterus.  Neck: Normal ROM. Neck supple. No JVD. No tracheal deviation. CVS: RRR, S1/S2 +, no murmurs, no gallops, no carotid bruit.  Pulmonary: Effort and breath sounds normal, no stridor, rhonchi, wheezes, rales.  Abdominal: Soft. BS +,  no distension, tenderness, rebound or guarding.  Musculoskeletal: Mild lower extremity edema  neuro: Sedated and lethargic. Skin: Skin is warm and dry. No rash noted. Psychiatric: Sedated & lethargic.   LABORATORY PANEL:   CBC Recent Labs  Lab 12/22/17 0410  WBC 9.3  HGB 9.6*  HCT 28.7*  PLT 241   ------------------------------------------------------------------------------------------------------------------  Chemistries  Recent Labs  Lab 12/22/17 0410 12/23/17 0433  NA 135 140  K 3.8 3.6  CL 102 103  CO2 25 31  GLUCOSE 314* 180*  BUN 31* 35*  CREATININE 0.93 1.06  CALCIUM 8.0* 8.1*  MG 1.9  --   AST 35 32  ALT 40 38  ALKPHOS 210* 181*  BILITOT 0.9 0.6   ------------------------------------------------------------------------------------------------------------------  Cardiac Enzymes Recent Labs  Lab 12/21/17 2005 12/22/17 0410  12/23/17 0433  TROPONINI 8.15* 5.84* 4.50*   ------------------------------------------------------------------------------------------------------------------  RADIOLOGY:  Dg Chest Port 1 View  Result Date: 12/22/2017 CLINICAL DATA:  Respiratory failure. EXAM: PORTABLE CHEST 1 VIEW COMPARISON:  Radiographs of December 19, 2017. FINDINGS: Tracheostomy tube is unchanged in position. Stable position of right-sided PICC line with coiling of the tip within the left brachycephalic vein. No pneumothorax is noted. Stable bilateral perihilar and basilar opacities are noted concerning for edema or atelectasis with associated pleural effusions. Bony thorax is unremarkable. IMPRESSION: Stable position of right-sided PICC line which is looped within the left brachycephalic vein. Stable bilateral perihilar and basilar opacities are noted concerning for edema or atelectasis with associated pleural effusions. Electronically Signed   By: Marijo Conception, M.D.   On: 12/22/2017 07:46     ASSESSMENT AND PLAN:   77 year old male admitted August 2 for acute encephalopathy and sepsis.  1.  Acute hypoxic respiratory failure: Patient initially intubated for possible aspiration pneumonia.  He was extubated however reintubated on August 12.  Patient is status post tracheostomy and PEG placement. - Vent management as per intensivist  2.  Aspiration pneumonia: Staphylococcus aureus grew from cultures - pt. Has completed treatment with abx.   3.  Non-STEMI: Patient is status post inferior wall myocardial infarction status post drug-eluting stent - Continue aspirin, Plavix, Atorvastatin  4.  DVT  of right upper arm - cont. Eliquis  5.  Anemia of chronic disease - Hg. Stable and no need for transfusion and follow Hg.   6.  AMS/Agitation - negative CT scan of the head - Neurology consultation appreciated and etiology  unclear and ?? ICU delirium - continue Seroquel and Klonopin - Continue fentanyl patch off of  infusion as per intensivist  7.  Diabetes: cont. Lantus, SSI and follow BS.    8. NUTR: cont. PEG tube feeds and tolerating it.   ?? LTAC  Management plans discussed with the patient's wife and she is in agreement.  CODE STATUS: FULL  TOTAL TIME TAKING CARE OF THIS PATIENT: 25 minutes.   POSSIBLE D/C ?/, DEPENDING ON CLINICAL CONDITION.   Henreitta Leber M.D on 12/23/2017 at 3:25 PM  Between 7am to 6pm - Pager - [phone]  After 6pm go to [email or web address] - password [email or web address] [facility]  Office  [phone]  CC: Primary care physician; Tracie Harrier, MD  Note: This dictation was prepared with Dragon dictation along with smaller phrase technology. Any transcriptional errors that result from this process are unintentional.

## 2017-12-23 NOTE — Care Management (Signed)
Marc [name] with [employer] has reached out to patient's wife via voicemail to see if she has any questions.  Patient restless off sedation per note. LTAC has the ability to manage with IV medications when MD is ready to transition to LTAC.

## 2017-12-23 NOTE — Progress Notes (Signed)
Name: Marc [name] MRN: [id number] DOB: 07-Jun-1940     CONSULTATION DATE: 11/29/2017 Objective & subjective: Patient remains on ventilator restless when off fentanyl  PAST MEDICAL HISTORY :   has a past medical history of Anxiety, BPH (benign prostatic hyperplasia), Colon adenomas, Diverticulosis, Hyperlipidemia, and Hypertension.  has a past surgical history that includes Cholecystectomy; Colonoscopy with propofol (N/A, 01/23/2016); Esophagogastroduodenoscopy (egd) with propofol (N/A, 11/27/2017); Tracheostomy tube placement (N/A, 12/13/2017); PEG placement (N/A, 12/17/2017); Coronary/Graft Acute MI Revascularization (N/A, 12/18/2017); LEFT HEART CATH AND CORONARY ANGIOGRAPHY (N/A, 12/18/2017); and CORONARY STENT INTERVENTION (N/A, 12/18/2017). Prior to Admission medications   Medication Sig Start Date End Date Taking? Authorizing Provider  carvedilol (COREG) 25 MG tablet Take 25 mg by mouth 2 (two) times daily with a meal.   Yes [provider]  megestrol (MEGACE) 40 MG/ML suspension Take 400 mg by mouth 2 (two) times daily.    Yes [provider]  amLODipine (NORVASC) 5 MG tablet Take 1 tablet (5 mg total) by mouth daily. Patient not taking: Reported on 11/29/2017 02/18/15   Loletha Grayer, MD  carbamide peroxide [facility]) 6.5 % otic solution Place 5 drops into the right ear 2 (two) times daily. Patient not taking: Reported on 11/29/2017 02/18/15   Loletha Grayer, MD  imipramine (TOFRANIL) 50 MG tablet Take 50 mg by mouth at bedtime.    [provider]  meclizine (ANTIVERT) 25 MG tablet Take 1 tablet (25 mg total) by mouth 3 (three) times daily. Patient not taking: Reported on 11/29/2017 02/18/15   Loletha Grayer, MD  pantoprazole (PROTONIX) 40 MG tablet Take 1 tablet (40 mg total) by mouth 2 (two) times daily for 14 days. 11/29/17 12/13/17  Carrie Mew, MD   Allergies  Allergen Reactions  . Glimepiride   . Tape Itching and Rash    FAMILY HISTORY:  family  history includes Hypertension in his other. SOCIAL HISTORY:  reports that he has never smoked. He has never used smokeless tobacco. He reports that he does not drink alcohol or use drugs.  REVIEW OF SYSTEMS:   Unable to obtain due to critical illness   VITAL SIGNS: Temp:  [98.1 F (36.7 C)-98.6 F (37 C)] 98.3 F (36.8 C) (08/26 0800) Pulse Rate:  [91-117] 110 (08/26 1300) Resp:  [17-31] 19 (08/26 1300) BP: (84-156)/(58-111) 123/85 (08/26 1300) SpO2:  [92 %-100 %] 100 % (08/26 1300) FiO2 (%):  [40 %-50 %] 40 % (08/26 1113)  Physical Examination:  Restless when off fentanyl drip On vent, tracheostomy in place, no distress, bilateral equal air entry no adventitious sounds S1 & S2 are audible with no murmur Benign abdominal exam with PEG in place.  Feeble peristalsis Wasted extremities and no edema ASSESSMENT / PLAN:  Ventilator dependent respiratory failure status post trach and PEG -Trach and vent weaning as tolerated  Altered mental status with restlessness on Seroquel and Klonopin.  No acute intracranial abnormalities and CT head -Monitor neuro status  Prerenal azotemia with intravascular volume depletion -Optimize volume, monitor renal panel and urine output  MRSA pneumonia status post treatment.  Bibasilar airspace disease with pleural effusion -Monitor chest x-Nykeem + CBC + FiO2 -Consider thoracentesis for worsening pleural effusion on to improve lung compliance  STEMI on 12/19/2017 status post DES to RCA, P A. fib with controlled rate -Aspirin + Plavix + statin + apixaban + amiodarone  Diabetes mellitus -Glycemic control  DVT right arm on apixaban  Full code  DVT & GI prophylaxis.  Continue with supportive care  Critical care time 35 minutes

## 2017-12-24 ENCOUNTER — Inpatient Hospital Stay: Payer: PPO

## 2017-12-24 LAB — GLUCOSE, CAPILLARY
GLUCOSE-CAPILLARY: 150 mg/dL — AB (ref 70–99)
GLUCOSE-CAPILLARY: 205 mg/dL — AB (ref 70–99)
GLUCOSE-CAPILLARY: 124 mg/dL — AB (ref 70–99)
GLUCOSE-CAPILLARY: 66 mg/dL — AB (ref 70–99)
Glucose-Capillary: 151 mg/dL — ABNORMAL HIGH (ref 70–99)
Glucose-Capillary: 73 mg/dL (ref 70–99)
Glucose-Capillary: 105 mg/dL — ABNORMAL HIGH (ref 70–99)

## 2017-12-24 LAB — CBC WITH DIFFERENTIAL/PLATELET
BASOS PCT: 0 %
Basophils Absolute: 0 10*3/uL (ref 0–0.1)
EOS PCT: 3 %
Eosinophils Absolute: 0.2 10*3/uL (ref 0–0.7)
HCT: 25.5 % — ABNORMAL LOW (ref 40.0–52.0)
Hemoglobin: 8.7 g/dL — ABNORMAL LOW (ref 13.0–18.0)
Lymphocytes Relative: 13 %
Lymphs Abs: 1 10*3/uL (ref 1.0–3.6)
MCH: 33.3 pg (ref 26.0–34.0)
MCHC: 34 g/dL (ref 32.0–36.0)
MCV: 97.9 fL (ref 80.0–100.0)
MONO ABS: 0.7 10*3/uL (ref 0.2–1.0)
Monocytes Relative: 10 %
Neutro Abs: 5.4 10*3/uL (ref 1.4–6.5)
Neutrophils Relative %: 74 %
PLATELETS: 223 10*3/uL (ref 150–440)
RBC: 2.6 MIL/uL — ABNORMAL LOW (ref 4.40–5.90)
RDW: 15.3 % — AB (ref 11.5–14.5)
WBC: 7.3 10*3/uL (ref 3.8–10.6)

## 2017-12-24 LAB — BASIC METABOLIC PANEL
Anion gap: 6 (ref 5–15)
Anion gap: 5 (ref 5–15)
BUN: 35 mg/dL — AB (ref 8–23)
BUN: 39 mg/dL — AB (ref 8–23)
CALCIUM: 8.5 mg/dL — AB (ref 8.9–10.3)
CO2: 32 mmol/L (ref 22–32)
CO2: 33 mmol/L — ABNORMAL HIGH (ref 22–32)
Calcium: 8.8 mg/dL — ABNORMAL LOW (ref 8.9–10.3)
Chloride: 103 mmol/L (ref 98–111)
Chloride: 105 mmol/L (ref 98–111)
Creatinine, Ser: 1.07 mg/dL (ref 0.61–1.24)
Creatinine, Ser: 1.04 mg/dL (ref 0.61–1.24)
GFR calc Af Amer: >60 mL/min (ref >60)
GFR calc Af Amer: >60 mL/min (ref >60)
Glucose, Bld: 162 mg/dL — ABNORMAL HIGH (ref 70–99)
Glucose, Bld: 117 mg/dL — ABNORMAL HIGH (ref 70–99)
POTASSIUM: 3.9 mmol/L (ref 3.5–5.1)
Potassium: 3.3 mmol/L — ABNORMAL LOW (ref 3.5–5.1)
SODIUM: 143 mmol/L (ref 135–145)
Sodium: 141 mmol/L (ref 135–145)

## 2017-12-24 LAB — HEMOGLOBIN AND HEMATOCRIT, BLOOD
HEMATOCRIT: 30.1 % — AB (ref 40.0–52.0)
Hemoglobin: 10.1 g/dL — ABNORMAL LOW (ref 13.0–18.0)

## 2017-12-24 LAB — PROTIME-INR
INR: 1.61
Prothrombin Time: 19 seconds — ABNORMAL HIGH (ref 11.4–15.2)

## 2017-12-24 LAB — MAGNESIUM: MAGNESIUM: 1.9 mg/dL (ref 1.7–2.4)

## 2017-12-24 LAB — APTT: aPTT: 37 seconds — ABNORMAL HIGH (ref 24–36)

## 2017-12-24 LAB — PHOSPHORUS: Phosphorus: 2.9 mg/dL (ref 2.5–4.6)

## 2017-12-24 MED ORDER — SODIUM CHLORIDE 0.9 % IV SOLN
80.0000 mg | Freq: Once | INTRAVENOUS | Status: AC
  Administered 2017-12-24: 80 mg via INTRAVENOUS
  Filled 2017-12-24: qty 80

## 2017-12-24 MED ORDER — PROTHROMBIN COMPLEX CONC HUMAN 500 UNITS IV KIT
3000.0000 [IU] | PACK | Freq: Once | Status: AC
  Administered 2017-12-25: 3000 [IU] via INTRAVENOUS
  Filled 2017-12-24: qty 3000

## 2017-12-24 MED ORDER — PANTOPRAZOLE SODIUM 40 MG IV SOLR: 40.0000 mg | Freq: Two times a day (BID) | INTRAVENOUS | Status: DC

## 2017-12-24 MED ORDER — PROTHROMBIN COMPLEX CONC HUMAN 500 UNITS IV KIT: 1500.0000 [IU] | PACK | Freq: Once | Status: DC

## 2017-12-24 MED ORDER — VITAMIN K1 10 MG/ML IJ SOLN
10.0000 mg | Freq: Once | INTRAVENOUS | Status: AC
  Administered 2017-12-24: 10 mg via INTRAVENOUS
  Filled 2017-12-24: qty 1

## 2017-12-24 MED ORDER — POTASSIUM CHLORIDE 20 MEQ/15ML (10%) PO SOLN
30.0000 meq | Freq: Once | ORAL | Status: AC
  Administered 2017-12-24: 30 meq
  Filled 2017-12-24: qty 30

## 2017-12-24 MED ORDER — VITAMIN K1 10 MG/ML IJ SOLN: 10.0000 mg | Freq: Once | INTRAVENOUS | Status: DC

## 2017-12-24 MED ORDER — SODIUM CHLORIDE 0.9% IV SOLUTION
Freq: Once | INTRAVENOUS | Status: AC
  Administered 2017-12-24: 22:00:00 via INTRAVENOUS

## 2017-12-24 MED ORDER — SODIUM CHLORIDE 0.9 % IV SOLN
8.0000 mg/h | INTRAVENOUS | Status: DC
  Administered 2017-12-24 – 2017-12-25 (×2): 8 mg/h via INTRAVENOUS
  Filled 2017-12-24 (×2): qty 80

## 2017-12-24 NOTE — Progress Notes (Signed)
Notified lab of pending STAT labs 

## 2017-12-24 NOTE — Progress Notes (Signed)
Pt found with blood from mouth, when assessed.  Mouth full of bright red blood.  Suctioning 278ml into cannister.  Pt swallowed blood also.   Tube feeding stopped and G-Tube placed to gravity.  Hinton Dyer, NP notified and at bedside for pt assessment.  Continue to hold eliquis at this time.

## 2017-12-24 NOTE — Progress Notes (Addendum)
Entered pt room with RT to assess patient.  Pt actively bleeding from tracheostomy site.  At 1900 when checked on patient, no bleeding noted.  Mitts placed back on pt per instruction.  Constance Holster, NP.  NP to bedside.  Orders received.

## 2017-12-24 NOTE — Progress Notes (Signed)
Pt actively bleeding from tracheostomy site vs. gastrointestinal bleeding instructed RN to apply surgiseal at trach site bleeding stopped for a brief period.  However, pt began to have bright red bleeding from his mouth a total of 200 ml.  I spoke with ENT Dr. Charolett Bumpers he does not think bleeding is coming from tracheostomy site recommended continued monitoring. Due to continued bleeding plan of care includes: administer 2 units of FFP, 10 mg vitamin K, discontinue eliquis, serial H&H q6hrs, protonix gtt, and will consult gastroenterology.  Will continue to monitor and assess pt vital signs, hemoglobin, and coags currently stable.   Marda Stalker, [place] Pager [phone] (please enter 7 digits) PCCM Consult Pager [phone] (please enter 7 digits)

## 2017-12-24 NOTE — Progress Notes (Signed)
Hinton Dyer, NP and Darlyn Chamber, NP at bedside.  Pt also with bright red blood from in-line suction.

## 2017-12-24 NOTE — Progress Notes (Signed)
Nutrition Follow-up  DOCUMENTATION CODES:   Severe malnutrition in context of chronic illness, Underweight  INTERVENTION:  Continue Osmolite 1.5 Cal at 40 mL/hr (960 mL goal daily volume) + Pro-Stat 30 mL BID per tube. Provides 1640 kcal, 90 grams of protein, 730 mL H2O daily.  Continue liquid MVI daily per tube.  Continue Juven BID per tube, each supplement provides 80 kcal, 14 grams amino acids, and vitamins/minerals essential for wound healing.  With free water flush of 100 mL Q8hrs patient will receive a total of 1030 mL including water in tube feeding.  NUTRITION DIAGNOSIS:   Severe Malnutrition related to chronic illness(etiology unknown at this time) as evidenced by moderate fat depletion, severe fat depletion, moderate muscle depletion, severe muscle depletion.  Ongoing - addressing with TF regimen.  GOAL:   Provide needs based on ASPEN/SCCM guidelines  Met with TF regimen.  MONITOR:   I & O's, Labs, TF tolerance, Weight trends  REASON FOR ASSESSMENT:   Ventilator    ASSESSMENT:   77 year old male with PMHx of HTN, HLD, anxiety, BPH, hx of colon adenomas, diverticulosis who was admitted with AMS of unclear etiology, required intubation on 8/3 for airway protection, possible PNA.   -Patient was extubated on 8/8. -Failed bedside swallow evaluation on 8/9 by SLP so NPO status was continued. -NGT was placed on 8/9 and tube feeds were restarted. -Patient was re-intubated on evening of 8/12 following cardiac arrest and 5-6 minutes of CPR. -Patient s/p tracheostomy tube placement on 8/16. -Rectal tube placed on 8/14 and removed on 8/17. -Patient s/p placement of PEG tube on 8/20. -Had STEMI on 8/21 s/p PCI/DESx2 to RCA. -Tube feeds were resumed on 8/22. -Another rectal tube placed on 8/27.  Patient remains ventilated through tracheostomy tube. Currently on PSV mode with FiO2 40% and PEEP 5 cmH2O. Abdomen feels soft. PEG tube functioning well.  Access: 20 Fr.  EndoVive Safety gastrostomy tube with C-clamp placed on 8/20; 3 cm at external bumper  MAP: 60-110 mmHg  TF: pt tolerating Osmolite 1.5 @ 40 mL/hr + Pro-Stat 30 mL BID  Patient is currently intubated on ventilator support Ve: 6 L/min Temp (24hrs), Avg:98.2 F (36.8 C), Min:97.6 F (36.4 C), Max:98.9 F (37.2 C)  Propofol: N/A  Medications reviewed and include: amiodarone, clonazepam, Colace BID (being held), famotidine, fentanyl patch, free water flush 100 mL Q8hrs, Novolog 0-15 units Q4hrs, Novolog 3 units Q4hrs for TF coverage, Lantus 15 units daily, liquid MVI daily per tube, Juven BID, Seroquel.  Labs reviewed: CBG 127-160 past 24 hrs, Potassium 3.3, BUN 35. Phosphorus and Magnesium WNL.  I/O: 900 mL UOP yesterday (0.6 mL/kg/hr); 340 mL output from rectal tube  No weight too trend since 8/14  Discussed with RN and on rounds.  Diet Order:   Diet Order    None      EDUCATION NEEDS:   No education needs have been identified at this time  Skin:  Skin Assessment: Skin Integrity Issues: Skin Integrity Issues:: Stage II, Incisions, Other (Comment) Stage II: first assessed on 8/14, now epithelialized Incisions: closed incision to neck Other: blister to right thigh; scattered ecchymosis; MSAD to groin and perineum   Last BM:  12/24/2017 - medium type 7 in rectal tube  Height:   Ht Readings from Last 1 Encounters:  12/11/17 5' 9"  (1.753 m)    Weight:   Wt Readings from Last 1 Encounters:  12/11/17 62.5 kg    Ideal Body Weight:  72.7 kg  BMI:  Body mass index is 20.35 kg/m.  Estimated Nutritional Needs:   Kcal:  8871 (PSU 2003b w/ MSJ 1298)  Protein:  85-100 grams (1.5-1.8 grams/kg)  Fluid:  2 L/day  Willey Blade, MS, RD, LDN Office: [phone] Pager: [phone] After Hours/Weekend Pager: [phone]

## 2017-12-24 NOTE — Care Management (Addendum)
MD met with patient's wife and she agrees with transition to [place] today.  Message left with Tammy with [facility] [phone] regarding LTAC authorization. Pending authorization to LTAC today. Update: received callback from Tammy that re-authorization will start today.

## 2017-12-24 NOTE — Progress Notes (Signed)
Pt with bright red blood in back of mouth.  Gently suctioned.  No new drainage from trach site.  Jeraldine Loots, NP.

## 2017-12-24 NOTE — Progress Notes (Signed)
Pt is bleeding from his mouth, nose, and his trach site at this time.  FFP infusing,.

## 2017-12-24 NOTE — Progress Notes (Signed)
Surgicel x2 applied to trach.  Active bleeding at trach site stopped at this time.

## 2017-12-24 NOTE — Progress Notes (Signed)
RT and RN found patient actively bleeding from tracheostomy around 1930.  Gauze applied to bleeding site. NP Darlyn Chamber and NP Hinton Dyer were notified, surgicel was ordered and applied by RN. RT remained at bedside to assist and change trache ties, inline suction, HME, inner cannula, and dressings applied.  Moderate amount of blood was suctioned from airway and mouth. Marc [name] is secured with trache ties. Patient tolerated change of equipment well. Patient is currently not bleeding. Will continue to monitor.

## 2017-12-24 NOTE — Progress Notes (Signed)
Name: Marc [name] MRN: [id number] DOB: 1940/07/15     CONSULTATION DATE: 11/29/2017  Subjective & Objective: Off Fentanyl gtt. And following simple commands.  PAST MEDICAL HISTORY :   has a past medical history of Anxiety, BPH (benign prostatic hyperplasia), Colon adenomas, Diverticulosis, Hyperlipidemia, and Hypertension.  has a past surgical history that includes Cholecystectomy; Colonoscopy with propofol (N/A, 01/23/2016); Esophagogastroduodenoscopy (egd) with propofol (N/A, 11/27/2017); Tracheostomy tube placement (N/A, 12/13/2017); PEG placement (N/A, 12/17/2017); Coronary/Graft Acute MI Revascularization (N/A, 12/18/2017); LEFT HEART CATH AND CORONARY ANGIOGRAPHY (N/A, 12/18/2017); and CORONARY STENT INTERVENTION (N/A, 12/18/2017). Prior to Admission medications   Medication Sig Start Date End Date Taking? Authorizing Provider  carvedilol (COREG) 25 MG tablet Take 25 mg by mouth 2 (two) times daily with a meal.   Yes [provider]  megestrol (MEGACE) 40 MG/ML suspension Take 400 mg by mouth 2 (two) times daily.    Yes [provider]  amLODipine (NORVASC) 5 MG tablet Take 1 tablet (5 mg total) by mouth daily. Patient not taking: Reported on 11/29/2017 02/18/15   Loletha Grayer, MD  carbamide peroxide [facility]) 6.5 % otic solution Place 5 drops into the right ear 2 (two) times daily. Patient not taking: Reported on 11/29/2017 02/18/15   Loletha Grayer, MD  imipramine (TOFRANIL) 50 MG tablet Take 50 mg by mouth at bedtime.    [provider]  meclizine (ANTIVERT) 25 MG tablet Take 1 tablet (25 mg total) by mouth 3 (three) times daily. Patient not taking: Reported on 11/29/2017 02/18/15   Loletha Grayer, MD  pantoprazole (PROTONIX) 40 MG tablet Take 1 tablet (40 mg total) by mouth 2 (two) times daily for 14 days. 11/29/17 12/13/17  Carrie Mew, MD   Allergies  Allergen Reactions  . Glimepiride   . Tape Itching and Rash    FAMILY HISTORY:  family history  includes Hypertension in his other. SOCIAL HISTORY:  reports that he has never smoked. He has never used smokeless tobacco. He reports that he does not drink alcohol or use drugs.  REVIEW OF SYSTEMS:   Unable to obtain due to critical illness   VITAL SIGNS: Temp:  [97.6 F (36.4 C)-98.9 F (37.2 C)] 98.2 F (36.8 C) (08/27 0800) Pulse Rate:  [89-130] 96 (08/27 0800) Resp:  [15-25] 15 (08/27 0800) BP: (91-164)/(48-113) 110/79 (08/27 0800) SpO2:  [88 %-100 %] 100 % (08/27 0800) FiO2 (%):  [40 %] 40 % (08/27 0810)  Physical Examination:  Awake, lethargic, following commands and off Fentanyl On vent, tracheostomy in place, no distress, bilateral equal air entry no adventitious sounds S1 & S2 are audible with no murmur Benign abdominal exam with PEG in place.  Feeble peristalsis Wasted extremities and no edema ASSESSMENT / PLAN:  Ventilator dependent respiratory failure status post trach and PEG -Trach and vent weaning as tolerated  Altered mental status with restlessness (improved and following simple commands) on Seroquel and Klonopin.  No acute intracranial abnormalities and CT head -Monitor neuro status  Prerenal azotemia with intravascular volume depletion -Optimize volume, monitor renal panel and urine output  MRSA pneumonia status post treatment.  Bibasilar airspace disease with pleural effusion -Monitor chest x-Dat + CBC + FiO2 -Consider thoracentesis for worsening pleural effusion to improve lung compliance  STEMI on 12/19/2017 status post DES to RCA, P A. fib with controlled rate -Aspirin + Plavix + statin + apixaban + amiodarone  Diabetes mellitus -Glycemic control  DVT right arm on apixaban  Anemia -Keep HB > 8 gm/dl.  Hypokalemia -Replete and monitor electrolytes  Full code  DVT & GI prophylaxis.  Continue with supportive care  Critical care time 35 minutes

## 2017-12-24 NOTE — Progress Notes (Addendum)
Labs, CXR completed.  Tube feedings on hold.  Pt continuing to bleed from trach.

## 2017-12-24 NOTE — Progress Notes (Signed)
[place] [facility] [place] NAME: Marc [name]    MR#:  [id number]  DATE OF BIRTH:  [birth year]  SUBJECTIVE:   No acute events overnight.  Remains Encephalopathic. Does not follow commands. Family at bedside. Planning for Possible LTAC placement.   REVIEW OF SYSTEMS:   Unobtainable as pt. Is sedated and on Trach.    Intubated Lurline Idol  Tolerating Diet: PEG  DRUG ALLERGIES:   Allergies  Allergen Reactions  . Glimepiride   . Tape Itching and Rash    VITALS:  Blood pressure 108/62, pulse 98, temperature 98.3 F (36.8 C), temperature source Oral, resp. rate 18, height 5\' 9"  (1.753 m), weight 62.5 kg, SpO2 97 %.  PHYSICAL EXAMINATION:  Constitutional: Pt. Lying in bed encephalopathic, lethargic and does not follow commands.  HEENT: AT, Normocephalic. S/p Trach. eyes: Conjunctivae are normal. PERRLA, no scleral icterus.  Neck: Normal ROM. Neck supple. No JVD. No tracheal deviation. CVS: RRR, S1/S2 +, no murmurs, no gallops, no carotid bruit.  Pulmonary: Effort and breath sounds normal, no stridor, rhonchi, wheezes, rales.  Abdominal: Soft. BS +,  no distension, tenderness, rebound or guarding.  Musculoskeletal: Mild lower extremity edema  neuro: Sedated and lethargic. Skin: Skin is warm and dry. No rash noted. Psychiatric: Sedated & lethargic.   LABORATORY PANEL:   CBC Recent Labs  Lab 12/24/17 0523  WBC 7.3  HGB 8.7*  HCT 25.5*  PLT 223   ------------------------------------------------------------------------------------------------------------------  Chemistries  Recent Labs  Lab 12/23/17 0433 12/24/17 0523  NA 140 141  K 3.6 3.3*  CL 103 103  CO2 31 32  GLUCOSE 180* 162*  BUN 35* 35*  CREATININE 1.06 1.07  CALCIUM 8.1* 8.5*  MG  --  1.9  AST 32  --   ALT 38  --   ALKPHOS 181*  --   BILITOT 0.6  --     ------------------------------------------------------------------------------------------------------------------  Cardiac Enzymes Recent Labs  Lab 12/21/17 2005 12/22/17 0410 12/23/17 0433  TROPONINI 8.15* 5.84* 4.50*   ------------------------------------------------------------------------------------------------------------------  RADIOLOGY:  Dg Chest Port 1 View  Result Date: 12/24/2017 CLINICAL DATA:  Follow-up of pneumonia EXAM: PORTABLE CHEST 1 VIEW COMPARISON:  Portable chest x-[place] of December 22, 2017 FINDINGS: The lungs remain borderline hypoinflated. Patchy airspace opacities persist bilaterally. The retrocardiac region remains dense and the hemidiaphragms obscured. Posterior layering pleural effusions are present bilaterally. The heart is normal in size. The pulmonary vascularity is largely obscured. The tracheostomy tube tip projects between the clavicular heads. The right PICC line is in stable position with a curved present at the junction of the right and left brachiocephalic veins. IMPRESSION: Fairly stable appearance of the bilateral airspace opacities and bilateral pleural effusions likely reflecting pneumonia. Electronically Signed   By: David  Martinique M.D.   On: 12/24/2017 08:56     ASSESSMENT AND PLAN:   77 year old male admitted August 2 for acute encephalopathy and sepsis.  1.  Acute hypoxic respiratory failure: Patient initially intubated for possible aspiration pneumonia.  He was extubated however reintubated on August 12.  Patient is status post tracheostomy and PEG placement. - Vent management as per intensivist  2.  Aspiration pneumonia: Staphylococcus aureus grew from Trach cultures - pt. Has completed treatment with abx.   3.  Non-STEMI: Patient is status post inferior wall myocardial infarction status post drug-eluting stent - Continue aspirin, Plavix, Atorvastatin  4.  DVT  of right upper arm - cont. Eliquis  5.  Anemia of chronic disease - Hg.  Stable and no need for transfusion and follow Hg.   6.  AMS/Agitation - negative CT scan of the head - Neurology consultation appreciated and etiology unclear and ?? ICU delirium - continue Seroquel and Klonopin - Continue fentanyl patch off of infusion as per intensivist.  Mental status very slow to improve.    7.  Diabetes: cont. Lantus, SSI and follow BS which are stable.    8. NUTR: cont. PEG tube feeds and tolerating it.   ?? LTAC placement as per Intensivist.   CODE STATUS: FULL  TOTAL TIME TAKING CARE OF THIS PATIENT: 25 minutes.   POSSIBLE D/C ??, DEPENDING ON CLINICAL CONDITION and progress.   Henreitta Leber M.D on 12/24/2017 at 3:34 PM  Between 7am to 6pm - Pager - [phone]  After 6pm go to [email or web address] - password [email or web address] [facility]  Office  [phone]  CC: Primary care physician; Tracie Harrier, MD  Note: This dictation was prepared with Dragon dictation along with smaller phrase technology. Any transcriptional errors that result from this process are unintentional.

## 2017-12-25 ENCOUNTER — Inpatient Hospital Stay: Payer: PPO

## 2017-12-25 DIAGNOSIS — Z7189 Other specified counseling: Secondary | ICD-10-CM

## 2017-12-25 DIAGNOSIS — Z515 Encounter for palliative care: Secondary | ICD-10-CM

## 2017-12-25 LAB — BPAM FFP
BLOOD PRODUCT EXPIRATION DATE: 201909012359
Blood Product Expiration Date: 201909012359
ISSUE DATE / TIME: 201908272348
ISSUE DATE / TIME: 201908272251
UNIT TYPE AND RH: 5100
Unit Type and Rh: 5100

## 2017-12-25 LAB — CBC WITH DIFFERENTIAL/PLATELET
BASOS ABS: 0 10*3/uL (ref 0–0.1)
BASOS ABS: 0 10*3/uL (ref 0–0.1)
BASOS ABS: 0 10*3/uL (ref 0–0.1)
BASOS PCT: 0 %
Basophils Relative: 0 %
Basophils Relative: 0 %
Eosinophils Absolute: 0.2 10*3/uL (ref 0–0.7)
Eosinophils Absolute: 0.2 10*3/uL (ref 0–0.7)
Eosinophils Absolute: 0.3 10*3/uL (ref 0–0.7)
Eosinophils Relative: 1 %
Eosinophils Relative: 3 %
Eosinophils Relative: 3 %
HEMATOCRIT: 26 % — AB (ref 40.0–52.0)
HEMATOCRIT: 27 % — AB (ref 40.0–52.0)
HEMATOCRIT: 34.5 % — AB (ref 40.0–52.0)
HEMOGLOBIN: 11.7 g/dL — AB (ref 13.0–18.0)
HEMOGLOBIN: 8.7 g/dL — AB (ref 13.0–18.0)
HEMOGLOBIN: 9.1 g/dL — AB (ref 13.0–18.0)
LYMPHS ABS: 0.7 10*3/uL — AB (ref 1.0–3.6)
LYMPHS PCT: 8 %
LYMPHS PCT: 4 %
Lymphocytes Relative: 11 %
Lymphs Abs: 0.6 10*3/uL — ABNORMAL LOW (ref 1.0–3.6)
Lymphs Abs: 1.1 10*3/uL (ref 1.0–3.6)
MCH: 31.8 pg (ref 26.0–34.0)
MCH: 32.5 pg (ref 26.0–34.0)
MCH: 33.2 pg (ref 26.0–34.0)
MCHC: 33.3 g/dL (ref 32.0–36.0)
MCHC: 33.8 g/dL (ref 32.0–36.0)
MCHC: 33.9 g/dL (ref 32.0–36.0)
MCV: 93.9 fL (ref 80.0–100.0)
MCV: 97.6 fL (ref 80.0–100.0)
MCV: 98.2 fL (ref 80.0–100.0)
Monocytes Absolute: 0.8 10*3/uL (ref 0.2–1.0)
Monocytes Absolute: 0.9 10*3/uL (ref 0.2–1.0)
Monocytes Absolute: 0.9 10*3/uL (ref 0.2–1.0)
Monocytes Relative: 10 %
Monocytes Relative: 10 %
Monocytes Relative: 6 %
NEUTROS ABS: 12.6 10*3/uL — AB (ref 1.4–6.5)
NEUTROS ABS: 6.6 10*3/uL — AB (ref 1.4–6.5)
NEUTROS ABS: 7 10*3/uL — AB (ref 1.4–6.5)
NEUTROS PCT: 79 %
Neutrophils Relative %: 76 %
Neutrophils Relative %: 89 %
Platelets: 232 10*3/uL (ref 150–440)
Platelets: 232 10*3/uL (ref 150–440)
Platelets: 235 10*3/uL (ref 150–440)
RBC: 2.67 MIL/uL — AB (ref 4.40–5.90)
RBC: 2.75 MIL/uL — AB (ref 4.40–5.90)
RBC: 3.68 MIL/uL — ABNORMAL LOW (ref 4.40–5.90)
RDW: 15.4 % — AB (ref 11.5–14.5)
RDW: 15.5 % — ABNORMAL HIGH (ref 11.5–14.5)
RDW: 16.7 % — ABNORMAL HIGH (ref 11.5–14.5)
WBC: 14.3 10*3/uL — ABNORMAL HIGH (ref 3.8–10.6)
WBC: 8.4 10*3/uL (ref 3.8–10.6)
WBC: 9.3 10*3/uL (ref 3.8–10.6)

## 2017-12-25 LAB — PROTIME-INR
INR: 1.12
Prothrombin Time: 14.3 seconds (ref 11.4–15.2)

## 2017-12-25 LAB — PREPARE FRESH FROZEN PLASMA: UNIT DIVISION: 0

## 2017-12-25 LAB — COMPREHENSIVE METABOLIC PANEL
ALK PHOS: 153 U/L — AB (ref 38–126)
ALT: 27 U/L (ref 0–44)
ANION GAP: 6 (ref 5–15)
AST: 25 U/L (ref 15–41)
Albumin: 2.9 g/dL — ABNORMAL LOW (ref 3.5–5.0)
BILIRUBIN TOTAL: 0.8 mg/dL (ref 0.3–1.2)
BUN: 38 mg/dL — ABNORMAL HIGH (ref 8–23)
CALCIUM: 8.8 mg/dL — AB (ref 8.9–10.3)
CO2: 33 mmol/L — ABNORMAL HIGH (ref 22–32)
Chloride: 106 mmol/L (ref 98–111)
Creatinine, Ser: 0.98 mg/dL (ref 0.61–1.24)
GFR calc Af Amer: >60 mL/min (ref >60)
GFR calc non Af Amer: >60 mL/min (ref >60)
Glucose, Bld: 115 mg/dL — ABNORMAL HIGH (ref 70–99)
Potassium: 3.9 mmol/L (ref 3.5–5.1)
Sodium: 145 mmol/L (ref 135–145)
TOTAL PROTEIN: 5.4 g/dL — AB (ref 6.5–8.1)

## 2017-12-25 LAB — HEMOGLOBIN AND HEMATOCRIT, BLOOD
HCT: 31 % — ABNORMAL LOW (ref 40.0–52.0)
HCT: 33.4 % — ABNORMAL LOW (ref 40.0–52.0)
HEMATOCRIT: 31.8 % — AB (ref 40.0–52.0)
HEMATOCRIT: 28.2 % — AB (ref 40.0–52.0)
HEMOGLOBIN: 10.7 g/dL — AB (ref 13.0–18.0)
HEMOGLOBIN: 11.2 g/dL — AB (ref 13.0–18.0)
Hemoglobin: 10.5 g/dL — ABNORMAL LOW (ref 13.0–18.0)
Hemoglobin: 9.8 g/dL — ABNORMAL LOW (ref 13.0–18.0)

## 2017-12-25 LAB — BLOOD GAS, ARTERIAL
Acid-Base Excess: 9.6 mmol/L — ABNORMAL HIGH (ref 0.0–2.0)
Bicarbonate: 34.8 mmol/L — ABNORMAL HIGH (ref 20.0–28.0)
FIO2: 1
LHR: 15 {breaths}/min
O2 SAT: 93.6 %
PEEP: 12 cmH2O
Patient temperature: 37
VT: 500 mL
pCO2 arterial: 50 mmHg — ABNORMAL HIGH (ref 32.0–48.0)
pH, Arterial: 7.45 (ref 7.350–7.450)
pO2, Arterial: 66 mmHg — ABNORMAL LOW (ref 83.0–108.0)

## 2017-12-25 LAB — GLUCOSE, CAPILLARY
GLUCOSE-CAPILLARY: 215 mg/dL — AB (ref 70–99)
Glucose-Capillary: 103 mg/dL — ABNORMAL HIGH (ref 70–99)
Glucose-Capillary: 104 mg/dL — ABNORMAL HIGH (ref 70–99)
Glucose-Capillary: 118 mg/dL — ABNORMAL HIGH (ref 70–99)
Glucose-Capillary: 144 mg/dL — ABNORMAL HIGH (ref 70–99)
Glucose-Capillary: 167 mg/dL — ABNORMAL HIGH (ref 70–99)
Glucose-Capillary: 97 mg/dL (ref 70–99)

## 2017-12-25 LAB — BASIC METABOLIC PANEL
Anion gap: 10 (ref 5–15)
BUN: 38 mg/dL — AB (ref 8–23)
CHLORIDE: 105 mmol/L (ref 98–111)
CO2: 32 mmol/L (ref 22–32)
Calcium: 8.8 mg/dL — ABNORMAL LOW (ref 8.9–10.3)
Creatinine, Ser: 0.99 mg/dL (ref 0.61–1.24)
GFR calc Af Amer: >60 mL/min (ref >60)
GFR calc non Af Amer: >60 mL/min (ref >60)
Glucose, Bld: 156 mg/dL — ABNORMAL HIGH (ref 70–99)
POTASSIUM: 3.8 mmol/L (ref 3.5–5.1)
SODIUM: 147 mmol/L — AB (ref 135–145)

## 2017-12-25 LAB — APTT: aPTT: 26 seconds (ref 24–36)

## 2017-12-25 LAB — FIBRIN DERIVATIVES D-DIMER (ARMC ONLY): Fibrin derivatives D-dimer (ARMC): 1818.21 ng{FEU}/mL — ABNORMAL HIGH (ref 0.00–499.00)

## 2017-12-25 LAB — TROPONIN I
TROPONIN I: 1.36 ng/mL — AB (ref <0.03)
Troponin I: 1.61 ng/mL (ref <0.03)
Troponin I: 1.12 ng/mL (ref <0.03)

## 2017-12-25 LAB — HEPARIN LEVEL (UNFRACTIONATED): HEPARIN UNFRACTIONATED: 3.06 [IU]/mL — AB (ref 0.30–0.70)

## 2017-12-25 LAB — FIBRINOGEN: Fibrinogen: 654 mg/dL — ABNORMAL HIGH (ref 210–475)

## 2017-12-25 LAB — PREPARE RBC (CROSSMATCH)

## 2017-12-25 MED ORDER — ALBUMIN HUMAN 25 % IV SOLN
25.0000 g | Freq: Once | INTRAVENOUS | Status: AC
  Administered 2017-12-25: 25 g via INTRAVENOUS
  Filled 2017-12-25: qty 100

## 2017-12-25 MED ORDER — SODIUM CHLORIDE 0.9% IV SOLUTION
Freq: Once | INTRAVENOUS | Status: AC
  Administered 2017-12-25: 02:00:00 via INTRAVENOUS

## 2017-12-25 MED ORDER — PANTOPRAZOLE SODIUM 40 MG IV SOLR
40.0000 mg | Freq: Two times a day (BID) | INTRAVENOUS | Status: DC
  Administered 2017-12-25 – 2017-12-28 (×6): 40 mg via INTRAVENOUS
  Filled 2017-12-25 (×6): qty 40

## 2017-12-25 MED ORDER — PANTOPRAZOLE SODIUM 40 MG IV SOLR: 40.0000 mg | Freq: Two times a day (BID) | INTRAVENOUS | Status: DC

## 2017-12-25 MED ORDER — SODIUM CHLORIDE 0.9% IV SOLUTION: Freq: Once | INTRAVENOUS | Status: DC

## 2017-12-25 MED ORDER — OSMOLITE 1.5 CAL PO LIQD
1000.0000 mL | ORAL | Status: DC
  Administered 2017-12-25 – 2017-12-27 (×3): 1000 mL

## 2017-12-25 MED ORDER — OSMOLITE 1.5 CAL PO LIQD: 1000.0000 mL | ORAL | Status: DC

## 2017-12-25 MED ORDER — PHENYLEPHRINE HCL-NACL 10-0.9 MG/250ML-% IV SOLN
0.0000 ug/min | INTRAVENOUS | Status: DC
  Filled 2017-12-25: qty 250

## 2017-12-25 MED ORDER — JUVEN PO PACK
1.0000 | PACK | Freq: Two times a day (BID) | ORAL | Status: DC
  Administered 2017-12-25 – 2017-12-28 (×6): 1 via ORAL

## 2017-12-25 MED ORDER — DEXMEDETOMIDINE HCL IN NACL 400 MCG/100ML IV SOLN
0.4000 ug/kg/h | INTRAVENOUS | Status: DC
  Administered 2017-12-25: 0.4 ug/kg/h via INTRAVENOUS
  Administered 2017-12-25: 1 ug/kg/h via INTRAVENOUS
  Administered 2017-12-26: 1.2 ug/kg/h via INTRAVENOUS
  Administered 2017-12-26: 2 ug/kg/h via INTRAVENOUS
  Administered 2017-12-26: 1.4 ug/kg/h via INTRAVENOUS
  Administered 2017-12-26: 2 ug/kg/h via INTRAVENOUS
  Administered 2017-12-26: 1.6 ug/kg/h via INTRAVENOUS
  Administered 2017-12-27: 1.5 ug/kg/h via INTRAVENOUS
  Administered 2017-12-27: 0.7 ug/kg/h via INTRAVENOUS
  Administered 2017-12-27 (×2): 2 ug/kg/h via INTRAVENOUS
  Administered 2017-12-27 – 2017-12-28 (×2): 1.5 ug/kg/h via INTRAVENOUS
  Administered 2017-12-28 (×3): 2 ug/kg/h via INTRAVENOUS
  Filled 2017-12-25 (×16): qty 100

## 2017-12-25 MED ORDER — IOPAMIDOL (ISOVUE-370) INJECTION 76%: 75.0000 mL | Freq: Once | INTRAVENOUS | Status: DC | PRN

## 2017-12-25 MED ORDER — PRO-STAT SUGAR FREE PO LIQD
30.0000 mL | Freq: Two times a day (BID) | ORAL | Status: DC
  Administered 2017-12-25 – 2017-12-28 (×6): 30 mL via ORAL

## 2017-12-25 MED ORDER — CHLORHEXIDINE GLUCONATE 0.12 % MT SOLN
OROMUCOSAL | Status: AC
  Filled 2017-12-25: qty 15

## 2017-12-25 MED ORDER — FENTANYL CITRATE (PF) 100 MCG/2ML IJ SOLN: 50.0000 ug | Freq: Once | INTRAMUSCULAR | Status: DC

## 2017-12-25 MED ORDER — SODIUM CHLORIDE 0.9 % IV SOLN
3.0000 g | Freq: Four times a day (QID) | INTRAVENOUS | Status: DC
  Administered 2017-12-25 – 2017-12-28 (×14): 3 g via INTRAVENOUS
  Filled 2017-12-25 (×17): qty 3

## 2017-12-25 NOTE — Consult Note (Signed)
Pharmacy Antibiotic Note  Marc [name] is a 77 y.o. male admitted on 11/29/2017. Pharmacy consulted for Unasyn dosing for possible aspiration PNA.    Plan: Start Unasyn 3g Iv every 6 hours.   Height: 5\' 9"  (175.3 cm) Weight: 137 lb 12.6 oz (62.5 kg) IBW/kg (Calculated) : 70.7  Temp (24hrs), Avg:98.1 F (36.7 C), Min:97.3 F (36.3 C), Max:98.9 F (37.2 C)  Recent Labs  Lab 12/18/17 0050 12/18/17 0521 12/19/17 0140 12/20/17 0412 12/21/17 0639 12/22/17 0410 12/23/17 0433 12/24/17 0523 12/24/17 2231  WBC 5.4 8.1 7.8 6.0  --  9.3  --  7.3 8.4  CREATININE 0.94  --  1.07 0.99 0.90 0.93 1.06 1.07 1.04  LATICACIDVEN 1.8 0.4*  --   --   --   --   --   --   --     Estimated Creatinine Clearance: 54.3 mL/min (by C-G formula based on SCr of 1.04 mg/dL).    Allergies  Allergen Reactions  . Glimepiride   . Tape Itching and Rash    Antimicrobials this admission: 8/28 Unasyn >>   Thank you for allowing pharmacy to be a part of this patient's care.  Pernell Dupre, PharmD, BCPS Clinical Pharmacist 12/25/2017 12:30 AM

## 2017-12-25 NOTE — Progress Notes (Addendum)
Name: Marc [name] MRN: [id number] DOB: 1940-12-12     CONSULTATION DATE: 11/29/2017  Subjective & Objective: Active bleeding at the trach site and upper airway with suction. Patient received Vit K + FFP + Kcentra and Apixaban was d/c. Case was discussed with Dr Cherie Ouch (ENT) and advised correction of coaguloapthy Desaturation to 91%, FIO2 to 100% and PEEP to 12.  PEG to suction, coffee ground aspirate was noticed and was started on Protonix gtt. CT angio of chest was attempted to r/o tracheoinnominate fistula, however had to be d/c because of PEA arrest. 4 min to ROSC and neurologically intact Foley cath was placed and noticed to have mild hematuria. Dr. Cherie Ouch was updated, kindly came to the bedside, large blood clot was suctioned from oropharynx. No more active bleeding and will hold on CT angio for now and reconsider it if rebleeding.  Patient is awake, responding and no focal neuro exam Mrs Kruzel was notified and agreed to the plan of care    PAST MEDICAL HISTORY :   has a past medical history of Anxiety, BPH (benign prostatic hyperplasia), Colon adenomas, Diverticulosis, Hyperlipidemia, and Hypertension.  has a past surgical history that includes Cholecystectomy; Colonoscopy with propofol (N/A, 01/23/2016); Esophagogastroduodenoscopy (egd) with propofol (N/A, 11/27/2017); Tracheostomy tube placement (N/A, 12/13/2017); PEG placement (N/A, 12/17/2017); Coronary/Graft Acute MI Revascularization (N/A, 12/18/2017); LEFT HEART CATH AND CORONARY ANGIOGRAPHY (N/A, 12/18/2017); and CORONARY STENT INTERVENTION (N/A, 12/18/2017). Prior to Admission medications   Medication Sig Start Date End Date Taking? Authorizing Provider  carvedilol (COREG) 25 MG tablet Take 25 mg by mouth 2 (two) times daily with a meal.   Yes [provider]  megestrol (MEGACE) 40 MG/ML suspension Take 400 mg by mouth 2 (two) times daily.    Yes [provider]  amLODipine (NORVASC) 5 MG tablet Take 1 tablet  (5 mg total) by mouth daily. Patient not taking: Reported on 11/29/2017 02/18/15   Loletha Grayer, MD  carbamide peroxide [facility]) 6.5 % otic solution Place 5 drops into the right ear 2 (two) times daily. Patient not taking: Reported on 11/29/2017 02/18/15   Loletha Grayer, MD  imipramine (TOFRANIL) 50 MG tablet Take 50 mg by mouth at bedtime.    [provider]  meclizine (ANTIVERT) 25 MG tablet Take 1 tablet (25 mg total) by mouth 3 (three) times daily. Patient not taking: Reported on 11/29/2017 02/18/15   Loletha Grayer, MD  pantoprazole (PROTONIX) 40 MG tablet Take 1 tablet (40 mg total) by mouth 2 (two) times daily for 14 days. 11/29/17 12/13/17  Carrie Mew, MD   Allergies  Allergen Reactions  . Glimepiride   . Tape Itching and Rash    FAMILY HISTORY:  family history includes Hypertension in his other. SOCIAL HISTORY:  reports that he has never smoked. He has never used smokeless tobacco. He reports that he does not drink alcohol or use drugs.  REVIEW OF SYSTEMS:   Unable to obtain due to critical illness   VITAL SIGNS: Temp:  [97.3 F (36.3 C)-98.9 F (37.2 C)] 97.6 F (36.4 C) (08/28 0007) Pulse Rate:  [88-122] 122 (08/28 0100) Resp:  [13-27] 27 (08/28 0145) BP: (102-154)/(48-115) 119/78 (08/28 0145) SpO2:  [91 %-100 %] 100 % (08/28 0100) FiO2 (%):  [40 %] 40 % (08/27 2034)  Physical Examination:  Awake, oriented and no focal motor deficits On vent, trach, no distress, active bleeding at trach site and upper airway. 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  -Didn't tolerate CT angio because of PEA arrest  *PEA cardiac arrest while in CT scan with 4 min to ROSC. Neurologically intact and no acute ST changes on EKG. *STEMI on 8/22/2019status post DES  to RCA, P A. fib with controlled rate -Aspirin + Plavix + statin +off apixaban + amiodarone  *Coagulopathy due to Apixaban. -d/c Apixaban, received FFP + Kcentra+Vit K -Monitor coags  *Anemia with blood loss -Keep HB > 8 gm/dl.  *Aspiration with upper airway bleeding. -Empiric Unasyn -Monitor CXR.  *Ventilator dependent respiratory failure status post trach and PEG. Because of desaturation and aspiration with upper airway bleeding FIO2 to 100% and PEEP to 12. -Monitor ABG and optimize vent setting -Trach and vent weaning as tolerated  *Questionable GI bleeding vs. Blood pooling in stomach with upper airway bleeding. -PPI and follow with GI evaluation  *Hematurea with no acute retention -Foley and monitor urine out put  *MRSA pneumonia status post treatment.Bibasilar airspace disease with pleural effusion -Monitor chest x-Maijor + CBC + FiO2 -Consider thoracentesis for worsening pleural effusion to improve lung compliance  *Altered mental status with restlessness (improved and following simple commands) on Seroquel and Klonopin. No acute intracranial abnormalities and CT head -Monitor neuro status  *Prerenal azotemia with intravascular volume depletion -Optimize volume, monitor renal panel and urine output  *Diabetes mellitus -Glycemic control  *DVT right arm  -Off Apixaban because of bleeding  *Full code  *DVT &GI prophylaxis. Continue with supportive care  Critical care time 70 minutes  Follow up: GI evaluation: No active GI bleeding, recommended, resume TF and keep off anticoagulation  Neurology: As discussed with Dr. Doy Mince, needs to r/o sepsis and infective etiology for consideration of trial IVIG -Procalcitonin and ID consult if elevated  Cardiology: Trop continues to trend down and no acute ST changes on EKG. Awaiting cardiology re-evaluation  Code status: DNR.  Critical care time 30 min

## 2017-12-25 NOTE — Significant Event (Cosign Needed)
Pt transported to CT by RN, respiratory therapist, and orderly upon arriving to CT pt became hypoxic with O2 sats dropping in the 70's, bradycardic hr 50's, and pulseless.  Therefore, code blue called and ACLS protocol and CPR initiated for a total of 5 minutes he received 1 amp of sodium bicarb and 1 amp of epinephrine prior to ROSC.  He continues to have BRB from his mouth and around tracheostomy site, most recent hgb 8.7.  He is currently receiving 2 units of pRBC's and ICU Intensivist Dr. Soyla Murphy at bedside.  Dr. Soyla Murphy notified ENT physician Dr. Charolett Bumpers he is currently at bedside to assess trach site.  Pts wife notified via telephone regarding decline in pts condition will continue to monitor and assess pt.  Marda Stalker, [place] Pager [phone] (please enter 7 digits) PCCM Consult Pager [phone] (please enter 7 digits)

## 2017-12-25 NOTE — Progress Notes (Signed)
RT assisted with patient transported to CT on transport vent. Patient SATs dropped into the low 60s and became bradycardic before CT could be performed. Code blue called. CPR was performed in CT for approximately 5-10 minutes. Patient bleeding from nose, mouth, and trache. Patient placed on 100% and 15 PEEP. ROSC was achieved and patient transported back to ICU once stable.

## 2017-12-25 NOTE — Care Management (Signed)
Late note entry from 12/24/17: RNCM received call from [place] requested PT re-eval which was requested from PT. PT will see first thing this AM.  I have reached back out to [place] with [facility] and she is aware of these changes and will notify me when auth received. Notified that patient is still trach/vent dependent and may not be appropriate to work with PT.

## 2017-12-25 NOTE — Progress Notes (Signed)
RT to assess patient. Patient bleeding from nose, mouth, and trache site. RN and NP aware. Trache site was cleaned. Mouth and trache suction performed. Large amount of bright red blood and clots removed from airway and mouth. Trache site cleaned and dried. Trache secure. Ventilator parameters monitored.

## 2017-12-25 NOTE — Progress Notes (Signed)
Subjective: Patient now with trach and PEG.  Ha had multiple cardiac and respiratory events.  On ventilator.    Objective: Current vital signs: BP 116/74   Pulse 99   Temp 97.8 F (36.6 C) (Oral)   Resp (!) 21   Ht _0  (1.753 m)   Wt 62.5 kg   SpO2 95%   BMI 20.35 kg/m  Vital signs in last 24 hours: Temp:  [97.3 F (36.3 C)-98.7 F (37.1 C)] 97.8 F (36.6 C) (08/28 0700) Pulse Rate:  [68-122] 99 (08/28 1115) Resp:  [13-27] 21 (08/28 1115) BP: (62-147)/(48-121) 116/74 (08/28 1115) SpO2:  [90 %-100 %] 95 % (08/28 1115) FiO2 (%):  [40 %-100 %] 80 % (08/28 1143)  Intake/Output from previous day: 08/27 0701 - 08/28 0700 In: 4423.2 [I.V.:449.7; Blood:2820.6; NG/GT:762; IV Piggyback:270.9] Out: 975 [Urine:875; Stool:100] Intake/Output this shift: Total I/O In: 269.3 [I.V.:148.4; IV Piggyback:120.9] Out: -  Nutritional status:  Diet Order    None      Neurologic Exam: Mental Status: Lethargic but able to be awakened.  Follows some simple commands.   Cranial Nerves: II: Pupils equal, round, reactive to light and accommodation III,IV, VI: Extra-ocular motions intact bilaterally V,VII: face symmetric Motor: Moves both upper extremities weakly.     Lab Results: Basic Metabolic Panel: Recent Labs  Lab 12/19/17 0103  12/19/17 0140 12/20/17 5465 12/21/17 6812 12/22/17 0410 12/23/17 7517 12/24/17 0017 12/24/17 2231 12/25/17 0019 12/25/17 0436  NA  --    < > 139 141 141 135 140 141 143 145 147*  K  --    < > 3.4* 3.5 4.4 3.8 3.6 3.3* 3.9 3.9 3.8  CL  --    < > 105 107 105 102 103 103 105 106 105  CO2  --    < > _1 32 33* 33* 32  GLUCOSE  --    < > 98 153* 171* 314* 180* 162* 117* 115* 156*  BUN  --    < > 20 26* 28* 31* 35* 35* 39* 38* 38*  CREATININE  --    < > 1.07 0.99 0.90 0.93 1.06 1.07 1.04 0.98 0.99  CALCIUM  --    < > 7.9* 7.8* 8.0* 8.0* 8.1* 8.5* 8.8* 8.8* 8.8*  MG  --   --  1.9 1.7 1.9 1.9  --  1.9  --   --   --   PHOS 3.9  --   --   --    --   --   --  2.9  --   --   --    < > = values in this interval not displayed.    Liver Function Tests: Recent Labs  Lab 12/22/17 0410 12/23/17 0433 12/25/17 0019  AST 35 32 25  ALT 40 38 27  ALKPHOS 210* 181* 153*  BILITOT 0.9 0.6 0.8  PROT 5.5* 5.4* 5.4*  ALBUMIN 2.1* 2.1* 2.9*   No results for input(s): LIPASE, AMYLASE in the last 168 hours. No results for input(s): AMMONIA in the last 168 hours.  CBC: Recent Labs  Lab 12/22/17 0410 12/24/17 0523 12/24/17 2029 12/24/17 2231 12/25/17 0019 12/25/17 0436 12/25/17 0811  WBC 9.3 7.3  --  8.4 9.3 14.3*  --   NEUTROABS  --  5.4  --  6.6* 7.0* 12.6*  --   HGB 9.6* 8.7* 10.1* 9.1* 8.7* 11.7* 10.5*  HCT 28.7* 25.5* 30.1* 27.0* 26.0* 34.5* 31.0*  MCV 98.4 97.9  --  98.2 97.6 93.9  --   PLT 241 223  --  235 232 232  --     Cardiac Enzymes: Recent Labs  Lab 12/21/17 2005 12/22/17 0410 12/23/17 0433 12/25/17 0436 12/25/17 0811  TROPONINI 8.15* 5.84* 4.50* 1.61* 1.36*    Lipid Panel: No results for input(s): CHOL, TRIG, HDL, CHOLHDL, VLDL, LDLCALC in the last 168 hours.  CBG: Recent Labs  Lab 12/24/17 2251 12/25/17 0022 12/25/17 0413 12/25/17 0729 12/25/17 1056  GLUCAP 105* 104* 144* 103* 97    Microbiology: Results for orders placed or performed during the hospital encounter of 11/29/17  Urine culture     Status: Abnormal   Collection Time: 11/29/17 11:32 PM  Result Value Ref Range Status   Specimen Description   Final    URINE, RANDOM Performed at [facility], [address] [place], [place] [address]    Special Requests   Final    NONE Performed at [facility], [address] [place], [place] [address]    Culture (A)  Final    <10,000 COLONIES/mL INSIGNIFICANT GROWTH Performed at [facility], [place] [address] [place], [place] [address]    Report Status 12/01/2017 FINAL  Final  Blood Culture (routine x 2)     Status: None   Collection Time: 11/29/17 11:32 PM   Result Value Ref Range Status   Specimen Description BLOOD LEFT ANTECUBITAL  Final   Special Requests   Final    BOTTLES DRAWN AEROBIC AND ANAEROBIC Blood Culture adequate volume   Culture   Final    NO GROWTH 5 DAYS Performed at [facility], [address] [place], [place] [address]    Report Status 12/04/2017 FINAL  Final  Blood Culture (routine x 2)     Status: None   Collection Time: 11/29/17 11:32 PM  Result Value Ref Range Status   Specimen Description BLOOD RIGHT ANTECUBITAL  Final   Special Requests   Final    BOTTLES DRAWN AEROBIC AND ANAEROBIC Blood Culture adequate volume   Culture   Final    NO GROWTH 5 DAYS Performed at [facility], [address] [place], [place] [address]    Report Status 12/04/2017 FINAL  Final  MRSA PCR Screening     Status: None   Collection Time: 11/30/17 11:59 AM  Result Value Ref Range Status   MRSA by PCR NEGATIVE NEGATIVE Final    Comment:        The GeneXpert MRSA Assay (FDA approved for NASAL specimens only), is one component of a comprehensive MRSA colonization surveillance program. It is not intended to diagnose MRSA infection nor to guide or monitor treatment for MRSA infections. Performed at [facility], [address] [place], [place] [address]   Culture, blood (routine x 2)     Status: None   Collection Time: 12/02/17  8:08 AM  Result Value Ref Range Status   Specimen Description BLOOD BLOOD RIGHT HAND  Final   Special Requests   Final    BOTTLES DRAWN AEROBIC AND ANAEROBIC Blood Culture adequate volume   Culture   Final    NO GROWTH 5 DAYS Performed at [facility], [address], [place], [place] [address]    Report Status 12/07/2017 FINAL  Final  Culture, blood (routine x 2)     Status: None   Collection Time: 12/02/17  8:08 AM  Result Value Ref Range Status   Specimen Description BLOOD BLOOD RIGHT WRIST  Final   Special Requests   Final    BOTTLES DRAWN AEROBIC AND  ANAEROBIC Blood Culture results may not be optimal due to an inadequate volume of blood received in culture bottles   Culture   Final    NO GROWTH 5 DAYS Performed at [facility], [address], [place], [place] [address]    Report Status 12/07/2017 FINAL  Final  CSF culture     Status: None   Collection Time: 12/02/17  9:40 AM  Result Value Ref Range Status   Specimen Description   Final    CSF Performed at [place] [address], [place], [place] [address]    Special Requests   Final    NONE Performed at [facility], [place], [place] [address]    Gram Stain   Final    RARE WBC SEEN MODERATE RED BLOOD CELLS NO ORGANISMS SEEN    Culture   Final    NO GROWTH 3 DAYS Performed at [facility], [place] [address], [place], [place] [address]    Report Status 12/05/2017 FINAL  Final  Culture, fungus without smear     Status: None   Collection Time: 12/02/17  9:40 AM  Result Value Ref Range Status   Specimen Description   Final    CSF Performed at [facility], [address], [place], [place] [address]    Special Requests   Final    NONE Performed at [facility], [address] [place], [place] [address]    Culture   Final    NO FUNGUS ISOLATED AFTER 21 DAYS Performed at [facility], [place] [address], [place], [place] [address]    Report Status 12/23/2017 FINAL  Final  Urine Culture     Status: None   Collection Time: 12/10/17 12:42 AM  Result Value Ref Range Status   Specimen Description   Final    URINE, RANDOM Performed at [facility], [address], [place], [place] [address]    Special Requests   Final    NONE Performed at [facility], [address] [place], [place] [address]    Culture   Final    NO GROWTH Performed at [facility], [place] [address] [place], [place] [address]    Report Status 12/11/2017 FINAL  Final  CULTURE, BLOOD (ROUTINE X 2) w  Reflex to ID Panel     Status: None   Collection Time: 12/10/17  2:39 AM  Result Value Ref Range Status   Specimen Description BLOOD LEFT [facility]  Final   Special Requests   Final    BOTTLES DRAWN AEROBIC AND ANAEROBIC Blood Culture adequate volume   Culture   Final    NO GROWTH 5 DAYS Performed at [facility], [address] [place],  [address]    Report Status 12/15/2017 FINAL  Final  CULTURE, BLOOD (ROUTINE X 2) w Reflex to ID Panel     Status: None   Collection Time: 12/10/17  2:39 AM  Result Value Ref Range Status   Specimen Description BLOOD LEFT HAND  Final   Special Requests   Final    BOTTLES DRAWN AEROBIC AND ANAEROBIC Blood Culture adequate volume   Culture   Final    NO GROWTH 5 DAYS Performed at [facility], [address] [place],  [address]    Report Status 12/15/2017 FINAL  Final  Culture,  respiratory (non-expectorated)     Status: None   Collection Time: 12/10/17  2:55 AM  Result Value Ref Range Status   Specimen Description   Final    TRACHEAL ASPIRATE Performed at [facility], [address], [place], [place] [address]    Special Requests   Final    NONE Performed at [facility], [address] [place], [place] [address]    Gram Stain   Final    RARE WBC PRESENT,BOTH PMN AND MONONUCLEAR FEW GRAM POSITIVE COCCI IN PAIRS Performed at [facility], [place] [address] [place], [place] [address]    Culture   Final    MODERATE METHICILLIN RESISTANT STAPHYLOCOCCUS AUREUS   Report Status 12/12/2017 FINAL  Final   Organism ID, Bacteria METHICILLIN RESISTANT STAPHYLOCOCCUS AUREUS  Final      Susceptibility   Methicillin resistant staphylococcus aureus - MIC*    CIPROFLOXACIN >=8 RESISTANT Resistant     ERYTHROMYCIN >=8 RESISTANT Resistant     GENTAMICIN <=0.5 SENSITIVE Sensitive     OXACILLIN >=4 RESISTANT Resistant     TETRACYCLINE <=1 SENSITIVE Sensitive     VANCOMYCIN 1 SENSITIVE Sensitive      TRIMETH/SULFA <=10 SENSITIVE Sensitive     CLINDAMYCIN <=0.25 SENSITIVE Sensitive     RIFAMPIN <=0.5 SENSITIVE Sensitive     Inducible Clindamycin NEGATIVE Sensitive     * MODERATE METHICILLIN RESISTANT STAPHYLOCOCCUS AUREUS  Culture, respiratory (non-expectorated)     Status: None   Collection Time: 12/11/17  9:11 AM  Result Value Ref Range Status   Specimen Description   Final    SPUTUM Performed at [facility], [address] [place], [place] [address]    Special Requests   Final    NONE Performed at [facility], [address] [place], [place] [address]    Gram Stain   Final    MODERATE WBC PRESENT, PREDOMINANTLY PMN FEW GRAM POSITIVE COCCI RARE GRAM NEGATIVE RODS Performed at [facility], [place] [address] [place], [place] [address]    Culture   Final    MODERATE METHICILLIN RESISTANT STAPHYLOCOCCUS AUREUS   Report Status 12/13/2017 FINAL  Final   Organism ID, Bacteria METHICILLIN RESISTANT STAPHYLOCOCCUS AUREUS  Final      Susceptibility   Methicillin resistant staphylococcus aureus - MIC*    CIPROFLOXACIN 4 RESISTANT Resistant     ERYTHROMYCIN >=8 RESISTANT Resistant     GENTAMICIN <=0.5 SENSITIVE Sensitive     OXACILLIN >=4 RESISTANT Resistant     TETRACYCLINE <=1 SENSITIVE Sensitive     VANCOMYCIN <=0.5 SENSITIVE Sensitive     TRIMETH/SULFA <=10 SENSITIVE Sensitive     CLINDAMYCIN <=0.25 SENSITIVE Sensitive     RIFAMPIN <=0.5 SENSITIVE Sensitive     Inducible Clindamycin NEGATIVE Sensitive     * MODERATE METHICILLIN RESISTANT STAPHYLOCOCCUS AUREUS  C difficile quick scan w PCR reflex     Status: Abnormal   Collection Time: 12/11/17  5:42 PM  Result Value Ref Range Status   C Diff antigen POSITIVE (A) NEGATIVE Final   C Diff toxin NEGATIVE NEGATIVE Final   C Diff interpretation Results are indeterminate. See PCR results.  Final    Comment: Performed at [facility], [address] [place], [place] [address]  C. Diff by PCR,  Reflexed     Status: None   Collection Time: 12/11/17  5:42 PM  Result Value Ref Range Status   Toxigenic C. Difficile by PCR NEGATIVE NEGATIVE Final  Comment: Patient is colonized with non toxigenic C. difficile. May not need treatment unless significant symptoms are present. Performed at [facility], [address], [place], [place] [address]   Culture, bal-quantitative     Status: Abnormal   Collection Time: 12/14/17 10:00 AM  Result Value Ref Range Status   Specimen Description   Final    BRONCHIAL WASHINGS Performed at [facility], [address] [place], [place] [address]    Special Requests   Final    NONE Performed at [facility], [address] [place], [place] [address]    Gram Stain   Final    ABUNDANT WBC PRESENT, PREDOMINANTLY PMN NO ORGANISMS SEEN Performed at [facility], [place] [address], [place], [place] [address]    Culture (A)  Final    2,000 COLONIES/mL METHICILLIN RESISTANT STAPHYLOCOCCUS AUREUS   Report Status 12/17/2017 FINAL  Final   Organism ID, Bacteria METHICILLIN RESISTANT STAPHYLOCOCCUS AUREUS (A)  Final      Susceptibility   Methicillin resistant staphylococcus aureus - MIC*    CIPROFLOXACIN >=8 RESISTANT Resistant     ERYTHROMYCIN >=8 RESISTANT Resistant     GENTAMICIN <=0.5 SENSITIVE Sensitive     OXACILLIN >=4 RESISTANT Resistant     TETRACYCLINE <=1 SENSITIVE Sensitive     VANCOMYCIN <=0.5 SENSITIVE Sensitive     TRIMETH/SULFA <=10 SENSITIVE Sensitive     CLINDAMYCIN <=0.25 SENSITIVE Sensitive     RIFAMPIN <=0.5 SENSITIVE Sensitive     Inducible Clindamycin NEGATIVE Sensitive     * 2,000 COLONIES/mL METHICILLIN RESISTANT STAPHYLOCOCCUS AUREUS    Coagulation Studies: Recent Labs    12/24/17 1955 12/25/17 0436  LABPROT 19.0* 14.3  INR 1.61 1.12    Imaging: Dg Chest Port 1 View  Result Date: 12/25/2017 CLINICAL DATA:  77 year old male with concern for aspiration. EXAM: PORTABLE CHEST 1 VIEW  COMPARISON:  Chest radiograph dated 12/24/2017 FINDINGS: Tracheostomy in similar position. Bilateral pleural effusions with associated airspace density involving the mid to lower lung field similar to prior radiograph which may represent atelectasis versus infiltrate. No pneumothorax. Stable cardiac silhouette. No acute osseous pathology. IMPRESSION: Bilateral pleural effusions with bilateral lower lobe atelectasis versus infiltrate. No significant interval change. Electronically Signed   By: Anner Crete M.D.   On: 12/25/2017 03:30   Dg Chest Port 1 View  Result Date: 12/24/2017 CLINICAL DATA:  Check tracheostomy tube EXAM: PORTABLE CHEST 1 VIEW COMPARISON:  12/24/2017 FINDINGS: Tracheostomy tube is well placed. Right-sided PICC line is again noted coiled within the left innominate vein. Cardiac shadow is stable. Persistent left retrocardiac consolidation is noted. Small effusions are noted bilaterally. Patchy perihilar infiltrates are again identified and stable. No acute bony abnormality is seen. IMPRESSION: Stable appearance of the chest when compare with the previous exam. Electronically Signed   By: Inez Catalina M.D.   On: 12/24/2017 20:13   Dg Chest Port 1 View  Result Date: 12/24/2017 CLINICAL DATA:  Follow-up of pneumonia EXAM: PORTABLE CHEST 1 VIEW COMPARISON:  Portable chest x-Nicolus of December 22, 2017 FINDINGS: The lungs remain borderline hypoinflated. Patchy airspace opacities persist bilaterally. The retrocardiac region remains dense and the hemidiaphragms obscured. Posterior layering pleural effusions are present bilaterally. The heart is normal in size. The pulmonary vascularity is largely obscured. The tracheostomy tube tip projects between the clavicular heads. The right PICC line is in stable position with a curved present at the junction of the right and left brachiocephalic veins. IMPRESSION: Fairly stable appearance of  the bilateral airspace opacities and bilateral pleural effusions  likely reflecting pneumonia. Electronically Signed   By: David  Martinique M.D.   On: 12/24/2017 08:56    Medications:  I have reviewed the patient's current medications. Scheduled: . amiodarone  400 mg Per Tube BID  . atorvastatin  80 mg Per Tube q1800  . chlorhexidine gluconate (MEDLINE KIT)  15 mL Mouth Rinse BID  . docusate  100 mg Per Tube BID  . fentaNYL  75 mcg Transdermal Q72H  . fentaNYL (SUBLIMAZE) injection  50 mcg Intravenous Once  . free water  100 mL Per Tube Q8H  . insulin aspart  0-15 Units Subcutaneous Q4H  . insulin aspart  3 Units Subcutaneous Q4H  . insulin glargine  15 Units Subcutaneous Daily  . mouth rinse  15 mL Mouth Rinse 10 times per day  . multivitamin  15 mL Per Tube Daily  . [START ON 12/21/2017] pantoprazole  40 mg Intravenous Q12H  . QUEtiapine  100 mg Oral QHS  . QUEtiapine  50 mg Per Tube q morning - 10a    Assessment/Plan: 77 year old male presenting with dysphonia and respiratory compromise.  Hospitalization has been complicated but multiple other cardiac and respiratory events as well.  Remains on the ventilator.   Unclear etiology for bulbar symptoms.  In the differential is PSP, CJD variant, seronegative  MG.  Lab work for paraneoplastic causes, MG were unremarkable.  Initially no severe metabolic etiologies noted and LP without pleocytosis.  Cultures and HSV negative.  Although IVIg is a consideration, even without a diagnosis since patient unable to have NCV/EMG, this is contraindicated with diagnosis of MRSA PNA.    Will continue to follow with you.     LOS: 26 days   Alexis Goodell, MD Neurology [phone] 12/25/2017  12:15 PM

## 2017-12-25 NOTE — Consult Note (Signed)
Marc [name], [place] [id number] 08-14-1940 Marc Loll, MD  Reason for Consult: Active bleeding from oropharynx and around trach site  HPI: The patient has had recent left heart cath and coronary angiography.  He had coronary stents placed and was noted to have atrial fib.  He also had DVT of his right upper extremity from PICC line.  These led to anticoagulation.  This evening he started with some fresh bleeding around the trach stoma site.  He had not had any manipulation in the area.  Some Surgicel was placed around here to help control bleeding and then he showed more bleeding coming through his mouth.  He vomited up some blood as well.  He had very little bleeding coming through the trach site.  He had a little bit the came through his nose.  I was called in to evaluate the patient further and see if we could find a bleeding source and help control it.  His trach tube was placed 11 days ago and is not had any bleeding since that time.  He had very little blood loss at the time of surgery and none in the postoperative period until the bleeding started today.  Allergies:  Allergies  Allergen Reactions  . Glimepiride   . Tape Itching and Rash    ROS: Review of systems normal other than 12 systems except per HPI.  PMH:  Past Medical History:  Diagnosis Date  . Anxiety   . BPH (benign prostatic hyperplasia)   . Colon adenomas    history of  . Diverticulosis   . Hyperlipidemia   . Hypertension     FH:  Family History  Problem Relation Age of Onset  . Hypertension Other   . Diabetes Neg Hx     SH:  Social History   Socioeconomic History  . Marital status: Married    Spouse name: Not on file  . Number of children: Not on file  . Years of education: Not on file  . Highest education level: Not on file  Occupational History  . Not on file  Social Needs  . Financial resource strain: Not on file  . Food insecurity:    Worry: Not on file    Inability: Not on file  . Transportation  needs:    Medical: Not on file    Non-medical: Not on file  Tobacco Use  . Smoking status: Never Smoker  . Smokeless tobacco: Never Used  Substance and Sexual Activity  . Alcohol use: No  . Drug use: No  . Sexual activity: Not on file  Lifestyle  . Physical activity:    Days per week: Not on file    Minutes per session: Not on file  . Stress: Not on file  Relationships  . Social connections:    Talks on phone: Not on file    Gets together: Not on file    Attends religious service: Not on file    Active member of club or organization: Not on file    Attends meetings of clubs or organizations: Not on file    Relationship status: Not on file  . Intimate partner violence:    Fear of current or ex partner: Not on file    Emotionally abused: Not on file    Physically abused: Not on file    Forced sexual activity: Not on file  Other Topics Concern  . Not on file  Social History Narrative  . Not on file    PSH:  Past  Surgical History:  Procedure Laterality Date  . CHOLECYSTECTOMY    . COLONOSCOPY WITH PROPOFOL N/A 01/23/2016   Procedure: COLONOSCOPY WITH PROPOFOL;  Surgeon: Manya Silvas, MD;  Location: [facility] ENDOSCOPY;  Service: Endoscopy;  Laterality: N/A;  . CORONARY STENT INTERVENTION N/A 12/18/2017   Procedure: CORONARY STENT INTERVENTION;  Surgeon: Yolonda Kida, MD;  Location: [place] CV LAB;  Service: Cardiovascular;  Laterality: N/A;  . CORONARY/GRAFT ACUTE MI REVASCULARIZATION N/A 12/18/2017   Procedure: Coronary/Graft Acute MI Revascularization;  Surgeon: Yolonda Kida, MD;  Location: [place] CV LAB;  Service: Cardiovascular;  Laterality: N/A;  . ESOPHAGOGASTRODUODENOSCOPY (EGD) WITH PROPOFOL N/A 11/27/2017   Procedure: ESOPHAGOGASTRODUODENOSCOPY (EGD) WITH PROPOFOL;  Surgeon: Manya Silvas, MD;  Location[facility] ENDOSCOPY;  Service: Endoscopy;  Laterality: N/A;  . LEFT HEART CATH AND CORONARY ANGIOGRAPHY N/A 12/18/2017   Procedure: LEFT HEART  CATH AND CORONARY ANGIOGRAPHY;  Surgeon: Yolonda Kida, MD;  Location: [place] CV LAB;  Service: Cardiovascular;  Laterality: N/A;  . PEG PLACEMENT N/A 12/17/2017   Procedure: PERCUTANEOUS ENDOSCOPIC GASTROSTOMY (PEG) PLACEMENT;  Surgeon: Toledo, Benay Pike, MD;  Location: [facility] ENDOSCOPY;  Service: Gastroenterology;  Laterality: N/A;  . TRACHEOSTOMY TUBE PLACEMENT N/A 12/13/2017   Procedure: TRACHEOSTOMY;  Surgeon: Clyde Canterbury, MD;  Location: [facility] ORS;  Service: ENT;  Laterality: N/A;    Physical  Exam: The patient had blood staining around his nose and all around his mouth.  When I open his mouth he could see the large clots in the posterior pharynx.  There is no sign of bleeding in the tongue or anterior mouth.  His neck is not swollen nor is there any bruising on his neck.  The trach stoma site appears to be relatively clear.  There is some Surgicel around the trach tube but no active bleeding here.  When I suction his trach tube there is no blood coming from the trachea.  After getting a headlight and tools to work with, a very large clot was removed from the posterior pharynx.  This was completely filling the hypopharynx, posterior pharynx, and into the nasopharynx.  Once I got this out you to see the posterior pharyngeal wall and there is no obvious fresh bleeding that was coming.  Examining his nose showed no evidence of active bleeding from the nose and had just been overflow blood that had pushed through.  Further evaluation showed no evidence of bleeding from the nasopharynx going down the throat.  Another small clot was removed from the hypopharynx but there is no further bleeding.  Patient was more comfortable now that this large clot was removed.  There is no bleeding coming from the stoma site.  After watching him for about 30 minutes and no further bleeding it is felt that he was probably stable and that his coagulopathy had been turned around.   A/P: Patient had bleeding somewhere  from his hypopharynx or tracheal area.  There was no manipulation prior to this to suggest any trauma.  He was on maximum anticoagulation and probably was not able to clot well.  He obtained some fresh frozen plasma and vitamin K and his anticoagulants were stopped.  His bleeding now seems to be stopped temporarily.  I do not know the specific bleeding site but it seems to be from deep in the hypopharynx or tongue base.  Since he is not bleeding now he does not need angiography done and can be just watched to see if there is any further  signs of bleeding.   Elon Alas Benson Por[place]o 12/25/2017 3:02 AM

## 2017-12-25 NOTE — Progress Notes (Signed)
Late entry - pt transported to CT scan at 0115.  After arrival to the scanner, pt became hypoxic with oxygen saturations to 54% on 100 FiO2.  Hinton Dyer, NP to CT scan.  Pt was bradycardic and noted to be in PEA.  Code Blue initiated and ROSC obtained.  Pt transported to ICU - unable to obtain scan.

## 2017-12-25 NOTE — Progress Notes (Signed)
[place] [facility] [place] NAME: Marc [name]    MR#:  [id number]  DATE OF BIRTH:  [birth year]  SUBJECTIVE:   The patient is on ventilation via trach.  He got CODE BLUE and CPR yesterday during CT scan.  Systolic blood pressure was low at 60s morning.  Blood pressure is better taking off of sedation.  Eliquis was discontinued due to active bleeding from oropharynx and around trach site.  No active bleeding today.  But has hematuria. REVIEW OF SYSTEMS:   Unobtainable as pt. Is confused and on Trach.    Intubated Lurline Idol  Tolerating Diet: PEG  DRUG ALLERGIES:   Allergies  Allergen Reactions  . Glimepiride   . Tape Itching and Rash    VITALS:  Blood pressure 116/74, pulse 99, temperature 97.8 F (36.6 C), temperature source Oral, resp. rate (!) 21, height 5\' 9"  (1.753 m), weight 62.5 kg, SpO2 95 %.  PHYSICAL EXAMINATION:  Constitutional: Pt. Lying in bed encephalopathic, lethargic and does not follow commands.  HEENT: AT, Normocephalic. S/p Trach. eyes: Conjunctivae are normal. PERRLA, no scleral icterus.  Neck: Normal ROM. Neck supple. No JVD. No tracheal deviation. CVS: RRR, S1/S2 +, no murmurs, no gallops, no carotid bruit.  Pulmonary: Effort and breath sounds normal, no stridor, rhonchi, wheezes, rales.  Abdominal: Soft. BS +,  no distension, tenderness, rebound or guarding.  Musculoskeletal: Mild lower extremity edema  neuro: Sedated and lethargic. Skin: Skin is warm and dry. No rash noted. Psychiatric: Sedated & lethargic.   LABORATORY PANEL:   CBC Recent Labs  Lab 12/25/17 0436  12/25/17 1202  WBC 14.3*  --   --   HGB 11.7*   < > 10.7*  HCT 34.5*   < > 31.8*  PLT 232  --   --    < > = values in this interval not displayed.   ------------------------------------------------------------------------------------------------------------------  Chemistries  Recent Labs  Lab 12/24/17 0523  12/25/17 0019 12/25/17 0436  NA 141    < > 145 147*  K 3.3*   < > 3.9 3.8  CL 103   < > 106 105  CO2 32   < > 33* 32  GLUCOSE 162*   < > 115* 156*  BUN 35*   < > 38* 38*  CREATININE 1.07   < > 0.98 0.99  CALCIUM 8.5*   < > 8.8* 8.8*  MG 1.9  --   --   --   AST  --   --  25  --   ALT  --   --  27  --   ALKPHOS  --   --  153*  --   BILITOT  --   --  0.8  --    < > = values in this interval not displayed.   ------------------------------------------------------------------------------------------------------------------  Cardiac Enzymes Recent Labs  Lab 12/23/17 0433 12/25/17 0436 12/25/17 0811  TROPONINI 4.50* 1.61* 1.36*   ------------------------------------------------------------------------------------------------------------------  RADIOLOGY:  Dg Chest Port 1 View  Result Date: 12/25/2017 CLINICAL DATA:  77 year old male with concern for aspiration. EXAM: PORTABLE CHEST 1 VIEW COMPARISON:  Chest radiograph dated 12/24/2017 FINDINGS: Tracheostomy in similar position. Bilateral pleural effusions with associated airspace density involving the mid to lower lung field similar to prior radiograph which may represent atelectasis versus infiltrate. No pneumothorax. Stable cardiac silhouette. No acute osseous pathology. IMPRESSION: Bilateral pleural effusions with bilateral lower lobe atelectasis versus infiltrate. No significant interval change. Electronically Signed   By: Milas Hock  Radparvar M.D.   On: 12/25/2017 03:30   Dg Chest Port 1 View  Result Date: 12/24/2017 CLINICAL DATA:  Check tracheostomy tube EXAM: PORTABLE CHEST 1 VIEW COMPARISON:  12/24/2017 FINDINGS: Tracheostomy tube is well placed. Right-sided PICC line is again noted coiled within the left innominate vein. Cardiac shadow is stable. Persistent left retrocardiac consolidation is noted. Small effusions are noted bilaterally. Patchy perihilar infiltrates are again identified and stable. No acute bony abnormality is seen. IMPRESSION: Stable appearance of the  chest when compare with the previous exam. Electronically Signed   By: Inez Catalina M.D.   On: 12/24/2017 20:13   Dg Chest Port 1 View  Result Date: 12/24/2017 CLINICAL DATA:  Follow-up of pneumonia EXAM: PORTABLE CHEST 1 VIEW COMPARISON:  Portable chest x-Dennys of December 22, 2017 FINDINGS: The lungs remain borderline hypoinflated. Patchy airspace opacities persist bilaterally. The retrocardiac region remains dense and the hemidiaphragms obscured. Posterior layering pleural effusions are present bilaterally. The heart is normal in size. The pulmonary vascularity is largely obscured. The tracheostomy tube tip projects between the clavicular heads. The right PICC line is in stable position with a curved present at the junction of the right and left brachiocephalic veins. IMPRESSION: Fairly stable appearance of the bilateral airspace opacities and bilateral pleural effusions likely reflecting pneumonia. Electronically Signed   By: David  Martinique M.D.   On: 12/24/2017 08:56     ASSESSMENT AND PLAN:   77 year old male admitted August 2 for acute encephalopathy and sepsis.  1.  Acute hypoxic respiratory failure: Patient initially intubated for possible aspiration pneumonia.  He was extubated however reintubated on August 12.  Patient is status post tracheostomy and PEG placement. - Vent management as per intensivist  2.  Aspiration pneumonia: Staphylococcus aureus grew from Trach cultures - pt. Has completed treatment with abx.   3.  Non-STEMI: Patient is status post inferior wall myocardial infarction status post drug-eluting stent - Continue aspirin, Plavix, Atorvastatin.  Eliquis was discontinued due to bleeding.  4.  DVT  of right upper arm - off Eliquis due to bleeding.  5.  Anemia of chronic disease due to acute blood loss - Hg. Stable and no need for transfusion and follow Hg.   6.  AMS/Agitation - negative CT scan of the head - Neurology consultation appreciated and etiology unclear and ??  ICU delirium - continue Seroquel and Klonopin - Continue fentanyl patch off of infusion as per intensivist.  Mental status very slow to improve.    7.  Diabetes: cont. Lantus, SSI and follow BS which are stable.    8. NUTR: cont. PEG tube feeds and tolerating it.    PEA cardiac arrest while in CT scan and hypotension.  Improved. Bleeding from oropharynx and around trach site and hematuria.  Off Eliquis.  The patient received FFP, vitamin K and Kcentra.  Very poor prognosis. I discussed with the patient's family members. CODE STATUS: FULL  TOTAL TIME TAKING CARE OF THIS PATIENT: 32 minutes.   POSSIBLE D/C ??, DEPENDING ON CLINICAL CONDITION and progress.   [name] M.D on 12/25/2017 at 12:41 PM  Between 7am to 6pm - Pager - [phone]  After 6pm go to [email or web address] - password [email or web address] [facility]  Office  [phone]  CC: Primary care physician; Tracie Harrier, MD  Note: This dictation was prepared with Dragon dictation along with smaller phrase technology. Any transcriptional errors that result from this process are unintentional.

## 2017-12-25 NOTE — Progress Notes (Signed)
Spoke with patients family about goals of care and family agrees that they have spoken amongst themseves before about changing code status to DNR and would like to know more about what may be next in plan of care. Family tearful while talking about code change. Family comforted and adviced that Do not resitate does not mean do not treat. Palliative consulted about goals of care.

## 2017-12-25 NOTE — Progress Notes (Addendum)
GI Inpatient Follow-up Note  Interval History: [name] is a known patient who we have previously consulted for PEG tube placement. Pt is s/p tracheostomy 08/16 and PEG 08/20. Pt also had recent left heart cath and coronary angiography where he had coronary stents placed. He also had DVT from right upper extremity from PICC line. Eliquis was discontinued due to active bleeding. During CT scan yesterday, patient coded and CPR was performed for a total of five minutes. GI was consulted for possible bleeding. Apparent bleeding from oropharynx and around trach site was evident yesterday. Pt was given FFP, vitamin K, and Kcentra. Pt was seen by ENT yesterday where a large clot was removed from his posterior pharynx. No active bleeding today. Per nursing, stools have been without bright red blood or tarry appearance.   Scheduled Inpatient Medications:  . amiodarone  400 mg Per Tube BID  . atorvastatin  80 mg Per Tube q1800  . chlorhexidine gluconate (MEDLINE KIT)  15 mL Mouth Rinse BID  . docusate  100 mg Per Tube BID  . fentaNYL  75 mcg Transdermal Q72H  . fentaNYL (SUBLIMAZE) injection  50 mcg Intravenous Once  . free water  100 mL Per Tube Q8H  . insulin aspart  0-15 Units Subcutaneous Q4H  . insulin aspart  3 Units Subcutaneous Q4H  . insulin glargine  15 Units Subcutaneous Daily  . mouth rinse  15 mL Mouth Rinse 10 times per day  . multivitamin  15 mL Per Tube Daily  . pantoprazole  40 mg Intravenous Q12H  . QUEtiapine  100 mg Oral QHS  . QUEtiapine  50 mg Per Tube q morning - 10a    Continuous Inpatient Infusions:   . sodium chloride 1,000 mL (12/25/17 0358)  . sodium chloride    . ampicillin-sulbactam (UNASYN) IV Stopped (12/25/17 0900)  . dexmedetomidine (PRECEDEX) IV infusion Stopped (12/25/17 1031)  . phenylephrine (NEO-SYNEPHRINE) Adult infusion Stopped (12/25/17 1028)    PRN Inpatient Medications:  sodium chloride, sodium chloride, acetaminophen **OR** acetaminophen,  bisacodyl, fentaNYL (SUBLIMAZE) injection, iopamidol, ipratropium-albuterol, lip balm, metoprolol tartrate, ondansetron (ZOFRAN) IV, sennosides, sodium chloride flush, sodium chloride flush  Review of Systems: Unable to obtain due to patient's status    Physical Examination: BP 117/72   Pulse 85   Temp 98.3 F (36.8 C) (Oral)   Resp 16   Ht 5' 9"  (1.753 m)   Wt 62.5 kg   SpO2 96%   BMI 20.35 kg/m  Gen: no acute distress, pt confused. Family in room. Unable to converse  HEENT: ERRLA, ETT in place on vent Neck: supple, no JVD or thyromegaly Chest: Course breath sounds bilaterally CV: RRR, no m/g/c/r Abd: soft, NT, ND, +BS in all four quadrants. Stool seen in bag with no signs of overt bleeding Ext: no edema, well perfused with 2+ pulses, Skin: no rash or lesions noted Lymph: no LAD  Data: Lab Results  Component Value Date   WBC 14.3 (H) 12/25/2017   HGB 10.7 (L) 12/25/2017   HCT 31.8 (L) 12/25/2017   MCV 93.9 12/25/2017   PLT 232 12/25/2017   Recent Labs  Lab 12/25/17 0436 12/25/17 0811 12/25/17 1202  HGB 11.7* 10.5* 10.7*   Lab Results  Component Value Date   NA 147 (H) 12/25/2017   K 3.8 12/25/2017   CL 105 12/25/2017   CO2 32 12/25/2017   BUN 38 (H) 12/25/2017   CREATININE 0.99 12/25/2017   Lab Results  Component Value Date   ALT 27  12/25/2017   AST 25 12/25/2017   ALKPHOS 153 (H) 12/25/2017   BILITOT 0.8 12/25/2017   Recent Labs  Lab 12/25/17 0436  APTT 26  INR 1.12   Assessment/Plan: 77 y/o Caucasian male with trach and PEG tube placed on 08/20 by Dr. Alice Reichert who we are asked to see again after some acute events yesterday. During attempted CT scan, patient coded and underwent 5 minutes of CPR. Apparent bleeding from oropharynx and around trach site was seen. ENT saw patient last night where a large clot was removed from the posterior pharynx. Bleeding source is most likely from hypopharynx or tracheal area. No active bleeding today. Hemoglobin today  10.7.   - PEG tube was flushed with 60 cc of water with no sign of bleeding. Lavage negative - Stool with no obvious bright red blood or melena - Pt is hemodynamically stable - No signs of active bleeding. Discussed with family at bedside that no acute intervention is needed at this time - Continue to hold anticoagulation and blood thinners - Pt may resume tube feeds - Please call if we can be of any future help or if bleeding returns    Please call with questions or concerns.    Octavia Bruckner, PA-C [place]  [phone]   Attending Physician Attestation:  I have obtained an interval history from the patient, reviewed the chart and performed a physical examination. The Advanced Practitioner's note, clinical impression and recommendations have been formulated with my input and I agree with the assessment and plan as outlined above.  Marc [name] is a 77 y/o male with whom our service is familiar due to recent PEG tube placement. Patient is s/p cardiac arrest yesterday s/p CPR. There was some concern about blood around the tracheostomy site and mouth but there has been no evidence of ongoing bleed.  HEENT: [place]/AT. Chest: Coarse BS's. CV: RRR Abd: soft , BS+  G tube was lavaged today without any blood return and Hgb is slightly decreased by deemed stable.  My opinion is the patient has posterior pharyngeal trauma most likely related to CPR efforts and no apparent Gastrointestinal bleeding of any kind at this time.  Will sign off. Call back if gastrointestinal bleeding occurs.  Thank you for allowing [place] to be involved in this patient's care.  Please call me if you have any questions.   Marc [name], M.D. [facility]  [phone] - Office [phone]- Cell phone

## 2017-12-25 NOTE — Progress Notes (Signed)
PT Cancellation Note  Patient Details Name: Marc [name] MRN: [id number] DOB: 1941-02-05   Cancelled Treatment:    Reason Eval/Treat Not Completed: Patient not medically ready.  PT consult received (1615 12/24/17) for imminent discharge (needed for transfer).  Chart reviewed.  Significant events noted in chart after PT consult placed.  Last night pt noted with active bleeding from trach site and then found blood in nose and mouth.  Also last night, per notes pt with PEA cardiac arrest while in CT scan with 4 minute ROSC.  Current BP noted to be 79/60.  Pt does not appear medically appropriate or stable for PT re-evaluation.  Will monitor pt's status and initiate PT re-evaluation as appropriate.  Leitha Bleak, PT 12/25/17, 8:40 AM [phone]

## 2017-12-25 NOTE — Progress Notes (Addendum)
Advanced Care Plan.  Purpose of Encounter: Hospice care. Parties in Attendance: The patient, his wife, daughter and the son, RN in the me. Patient's Decisional Capacity: No. Medical Story: Marc [name]  is a 77 y.o. male with a known history of HTN, HLD, BPH p/w AMS, lethargy, generalized weakness.  He was admitted for acute hypoxic respiratory failure: Patient initially intubated for possible aspiration pneumonia. He was extubated however reintubated on August 12.  Patient is status post tracheostomy and PEG placement.  He developed non-STEMI and treated with heparin drip then changed to Eliquis, which was discontinued confused to bleeding and anemia.  The patient also has altered mental status.  He is still confused.  He got CODE BLUE during CT scan yesterday.  Hypotension this morning.  I discussed with the patient's wife, daughter and the son about the patient's current condition, very poor prognosis and hospice care.  They agree to consider hospice care and have meeting with palliative care staff. Goals of Care Determinations: Hospice care. Plan:  Code Status: Full code.  Time spent discussing advance care planning: 18 minutes.

## 2017-12-25 NOTE — Care Management (Signed)
Patient not stable for transition to LTAC. HTA case worker aware and will hold authorization. Erika with [employer] updated.

## 2017-12-25 NOTE — Progress Notes (Signed)
Patients wife stated that patient took Lipitor years ago and stopped taking it related to muscle weakness and soreness. Once patient stopped taking it the soreness and weakness improved.

## 2017-12-25 NOTE — Progress Notes (Signed)
Pt with o2 sats 92 - increased vent to 100 with PEEP 10.  Blood from nose, mouth, trach, and penis.  Hinton Dyer, NP at bedside.

## 2017-12-26 DIAGNOSIS — Z9911 Dependence on respirator [ventilator] status: Secondary | ICD-10-CM

## 2017-12-26 DIAGNOSIS — Z7189 Other specified counseling: Secondary | ICD-10-CM

## 2017-12-26 DIAGNOSIS — Z515 Encounter for palliative care: Secondary | ICD-10-CM

## 2017-12-26 DIAGNOSIS — R531 Weakness: Secondary | ICD-10-CM

## 2017-12-26 DIAGNOSIS — J9621 Acute and chronic respiratory failure with hypoxia: Secondary | ICD-10-CM

## 2017-12-26 DIAGNOSIS — J9600 Acute respiratory failure, unspecified whether with hypoxia or hypercapnia: Secondary | ICD-10-CM

## 2017-12-26 LAB — BPAM RBC
BLOOD PRODUCT EXPIRATION DATE: 201909232359
BLOOD PRODUCT EXPIRATION DATE: 201909252359
Blood Product Expiration Date: 201909232359
Blood Product Expiration Date: 201909232359
Blood Product Expiration Date: 201909232359
Blood Product Expiration Date: 201909232359
ISSUE DATE / TIME: 201908280139
ISSUE DATE / TIME: 201908201630
ISSUE DATE / TIME: 201908280129
UNIT TYPE AND RH: 5100
UNIT TYPE AND RH: 5100
UNIT TYPE AND RH: 5100
UNIT TYPE AND RH: 5100
UNIT TYPE AND RH: 5100
Unit Type and Rh: 5100

## 2017-12-26 LAB — TYPE AND SCREEN
ABO/RH(D): O POS
Antibody Screen: NEGATIVE
UNIT DIVISION: 0
UNIT DIVISION: 0
Unit division: 0
Unit division: 0
Unit division: 0
Unit division: 0

## 2017-12-26 LAB — PREPARE RBC (CROSSMATCH)

## 2017-12-26 LAB — PREPARE FRESH FROZEN PLASMA: Unit division: 0

## 2017-12-26 LAB — GLUCOSE, CAPILLARY
GLUCOSE-CAPILLARY: 152 mg/dL — AB (ref 70–99)
Glucose-Capillary: 204 mg/dL — ABNORMAL HIGH (ref 70–99)
Glucose-Capillary: 150 mg/dL — ABNORMAL HIGH (ref 70–99)
Glucose-Capillary: 141 mg/dL — ABNORMAL HIGH (ref 70–99)
Glucose-Capillary: 160 mg/dL — ABNORMAL HIGH (ref 70–99)
Glucose-Capillary: 145 mg/dL — ABNORMAL HIGH (ref 70–99)

## 2017-12-26 LAB — PROCALCITONIN: PROCALCITONIN: 0.17 ng/mL

## 2017-12-26 LAB — BPAM FFP
Blood Product Expiration Date: 201909022359
ISSUE DATE / TIME: 201908280105
Unit Type and Rh: 5100

## 2017-12-26 LAB — HEMOGLOBIN AND HEMATOCRIT, BLOOD
HCT: 28.2 % — ABNORMAL LOW (ref 40.0–52.0)
HCT: 30.4 % — ABNORMAL LOW (ref 40.0–52.0)
HEMATOCRIT: 29.1 % — AB (ref 40.0–52.0)
HEMATOCRIT: 29 % — AB (ref 40.0–52.0)
HEMOGLOBIN: 9.6 g/dL — AB (ref 13.0–18.0)
HEMOGLOBIN: 9.6 g/dL — AB (ref 13.0–18.0)
Hemoglobin: 9.8 g/dL — ABNORMAL LOW (ref 13.0–18.0)
Hemoglobin: 10.3 g/dL — ABNORMAL LOW (ref 13.0–18.0)

## 2017-12-26 LAB — CK: CK TOTAL: 16 U/L — AB (ref 49–397)

## 2017-12-26 MED ORDER — ROSUVASTATIN CALCIUM 10 MG PO TABS
20.0000 mg | ORAL_TABLET | Freq: Every day | ORAL | Status: DC
  Administered 2017-12-26 – 2017-12-27 (×2): 20 mg via ORAL
  Filled 2017-12-26 (×2): qty 2

## 2017-12-26 MED ORDER — METOPROLOL TARTRATE 5 MG/5ML IV SOLN: 2.5000 mg | Freq: Once | INTRAVENOUS | Status: DC

## 2017-12-26 MED ORDER — ALTEPLASE 2 MG IJ SOLR
2.0000 mg | INTRAMUSCULAR | Status: DC | PRN
  Administered 2017-12-27: 2 mg
  Filled 2017-12-26: qty 2

## 2017-12-26 MED ORDER — SODIUM CHLORIDE 0.45 % IV SOLN
INTRAVENOUS | Status: DC
  Administered 2017-12-26: 50 mL/h via INTRAVENOUS
  Administered 2017-12-28: 06:00:00 via INTRAVENOUS

## 2017-12-26 NOTE — Progress Notes (Signed)
PT Cancellation Note  Patient Details Name: Marc [name] MRN: [id number] DOB: October 06, 1940   Cancelled Treatment:    Reason Eval/Treat Not Completed: Patient's level of consciousness.  Order received. Chart reviewed.  PT attempted to awaken pt by calling his name and with touch.  Pt did not respond.  Pt's family stated that he was awake earlier today but that he has been somnolent very recently and difficult to arouse.  Will re-attempt later as time allows.   Roxanne Gates, PT, DPT 12/26/2017, 11:07 AM

## 2017-12-26 NOTE — Consult Note (Signed)
Consultation Note Date: 12/26/2017   Patient Name: Marc [name]  DOB: November 25, 1940  MRN: [id number]  Age / Sex: 77 y.o., male  PCP: Marc Harrier, MD Referring Physician: [name], MD  Reason for Consultation: Establishing goals of care  HPI/Patient Profile: 77 y.o. male  with past medical history of DM, HLD, HTN, colonic adenomas, anxiety, diverticulosis, and BPH admitted on 11/29/2017 with weakness, loss of appetite, AMS, and weight loss. Per patient's wife, he had been experiencing weakness and loss of appetite since January. He had lost 30-40 pounds. Patient was intubated shortly after admission and diagnosed with aspiration pna. He was extubated 8/8 and had to be reintubated 8/12 after cardiac arrest. Trach was placed 8/16 and PEG placed 8/20. Patient had STEMI 8/22. Patient experienced 2nd cardiac arrest 8/28 while getting CT scan. Hospital course has also been complicated with agitation and bleeding around trach, nose, and mouth. PMT consulted for [place].  Clinical Assessment and Goals of Care: I have reviewed medical records including EPIC notes, labs and imaging, received report from RN, assessed the patient and then met at the bedside along with patient's wife, Marc [name], son, Marc [name], daughter, and daughter in law,  to discuss diagnosis prognosis, GOC, EOL wishes, disposition and options.  I introduced Palliative Medicine as specialized medical care for people living with serious illness. It focuses on providing relief from the symptoms and stress of a serious illness. The goal is to improve quality of life for both the patient and the family.  Family discussed detailed report of events leading up to hospitalization and events during hospitalization. They discussed his decline since January including worsening weakness, weight loss, and loss of appetite. Family shares their frustrations about patient's continued decline and lack of clarity surrounding  diagnosis/ questionable neurological disorder.   We discussed his current illness and what it means in the larger context of their on-going co-morbidities. Discussed dependence on mechanical ventilator, cardiac arrests. Family shares concerns about lack of improvement and progressive weakness.  I attempted to elicit values and goals of care important to the patient.  They talk about how they are unsure if patient would have wanted this level of care. They talk about uncertainty around trach and peg.   The difference between aggressive medical intervention and comfort care was considered in light of the patient's goals of care. Family shares concerns that patient is not gaining anything from continued medical interventions and questions if they should only focus on patient's comfort. We discussed different options moving forward. Family express that they are interested in comfort care - after further discussion they agree to speak to neurology and critical care again prior to making any decisions.   Advanced directives, concepts specific to code status, artifical feeding and hydration, and rehospitalization were considered and discussed. Family shares that they would like to elect DNR status - they discuss significant declines they have witnessed after each cardiac arrest and feel that if this were to happen again they would like the patient to be allowed to pass naturally.   Discussed continued follow up and continued discussions around goals of care after family has time to speak to neurology and critical care.   Questions and concerns were addressed. The family was encouraged to call with questions or concerns.   Primary Decision Maker NEXT OF KIN - wife, Marc [name]   SUMMARY OF RECOMMENDATIONS   - code status changed to DNR - initial palliative discussion - family struggling with decisions and goals of care, they are  concerned patient is suffering - family expressed interest in focusing on  patient's comfort and avoiding any further aggressive medical interventions; however, they agree to speak to neurology and critical care prior to proceeding with any decisions - continue current care for now  Code Status/Advance Care Planning:  DNR  Symptom Management:   Per primary - pt resting comfortably during my assessment  Palliative Prophylaxis:   Aspiration, Bowel Regimen, Delirium Protocol, Frequent Pain Assessment, Oral Care and Turn Reposition  Additional Recommendations (Limitations, Scope, Preferences):  Full Scope Treatment  Prognosis:   Unable to determine  Discharge Planning: To Be Determined      Primary Diagnoses: Present on Admission: . Altered mental status   I have reviewed the medical record, interviewed the patient and family, and examined the patient. The following aspects are pertinent.  Past Medical History:  Diagnosis Date  . Anxiety   . BPH (benign prostatic hyperplasia)   . Colon adenomas    history of  . Diverticulosis   . Hyperlipidemia   . Hypertension    Social History   Socioeconomic History  . Marital status: Married    Spouse name: Not on file  . Number of children: Not on file  . Years of education: Not on file  . Highest education level: Not on file  Occupational History  . Not on file  Social Needs  . Financial resource strain: Not on file  . Food insecurity:    Worry: Not on file    Inability: Not on file  . Transportation needs:    Medical: Not on file    Non-medical: Not on file  Tobacco Use  . Smoking status: Never Smoker  . Smokeless tobacco: Never Used  Substance and Sexual Activity  . Alcohol use: No  . Drug use: No  . Sexual activity: Not on file  Lifestyle  . Physical activity:    Days per week: Not on file    Minutes per session: Not on file  . Stress: Not on file  Relationships  . Social connections:    Talks on phone: Not on file    Gets together: Not on file    Attends religious service:  Not on file    Active member of club or organization: Not on file    Attends meetings of clubs or organizations: Not on file    Relationship status: Not on file  Other Topics Concern  . Not on file  Social History Narrative  . Not on file   Family History  Problem Relation Age of Onset  . Hypertension Other   . Diabetes Neg Hx    Scheduled Meds: . amiodarone  400 mg Per Tube BID  . chlorhexidine gluconate (MEDLINE KIT)  15 mL Mouth Rinse BID  . docusate  100 mg Per Tube BID  . feeding supplement (OSMOLITE 1.5 CAL)  1,000 mL Per Tube Q24H  . feeding supplement (PRO-STAT SUGAR FREE 64)  30 mL Oral BID  . fentaNYL  75 mcg Transdermal Q72H  . fentaNYL (SUBLIMAZE) injection  50 mcg Intravenous Once  . free water  100 mL Per Tube Q8H  . insulin aspart  0-15 Units Subcutaneous Q4H  . insulin aspart  3 Units Subcutaneous Q4H  . insulin glargine  15 Units Subcutaneous Daily  . mouth rinse  15 mL Mouth Rinse 10 times per day  . multivitamin  15 mL Per Tube Daily  . nutrition supplement (JUVEN)  1 packet Oral BID BM  .  pantoprazole  40 mg Intravenous Q12H  . QUEtiapine  100 mg Oral QHS  . QUEtiapine  50 mg Per Tube q morning - 10a   Continuous Infusions: . sodium chloride 1,000 mL (12/25/17 0358)  . sodium chloride    . ampicillin-sulbactam (UNASYN) IV 200 mL/hr at 12/26/17 0500  . dexmedetomidine (PRECEDEX) IV infusion 2 mcg/kg/hr (12/26/17 0500)  . phenylephrine (NEO-SYNEPHRINE) Adult infusion Stopped (12/25/17 1028)   PRN Meds:.sodium chloride, sodium chloride, acetaminophen **OR** acetaminophen, bisacodyl, fentaNYL (SUBLIMAZE) injection, iopamidol, ipratropium-albuterol, lip balm, metoprolol tartrate, ondansetron (ZOFRAN) IV, sennosides, sodium chloride flush, sodium chloride flush Allergies  Allergen Reactions  . Lipitor [Atorvastatin Calcium] Other (See Comments)    Muscle Weakness and muscle soreness.  . Glimepiride   . Tape Itching and Rash   Review of Systems  Unable to  perform ROS: Intubated    Physical Exam  Constitutional: He appears lethargic. He has a sickly appearance. No distress.  frail  HENT:  Head: Normocephalic and atraumatic.  Cardiovascular: Normal rate and regular rhythm.  Pulmonary/Chest: Effort normal and breath sounds normal.  Abdominal: Soft. Bowel sounds are normal.  Musculoskeletal:       Right lower leg: He exhibits edema.       Left lower leg: He exhibits edema.  Neurological: He appears lethargic.  Skin: There is pallor.    Vital Signs: BP 106/78   Pulse 72   Temp 97.9 F (36.6 C) (Axillary)   Resp 15   Ht 5' 9"  (1.753 m)   Wt 62.5 kg   SpO2 100%   BMI 20.35 kg/m  Pain Scale: CPOT POSS *See Group Information*: 2-Acceptable,Slightly drowsy, easily aroused Pain Score: 2    SpO2: SpO2: 100 % O2 Device:SpO2: 100 % O2 Flow Rate: .O2 Flow Rate (L/min): 60 L/min  IO: Intake/output summary:   Intake/Output Summary (Last 24 hours) at 12/26/2017 0759 Last data filed at 12/26/2017 0600 Gross per 24 hour  Intake 1210.55 ml  Output 1025 ml  Net 185.55 ml    LBM: Last BM Date: 12/25/17 Baseline Weight: Weight: 58.1 kg Most recent weight: Weight: 62.5 kg     Palliative Assessment/Data: PPS 30%   Flowsheet Rows     Most Recent Value  Intake Tab  Referral Department  Critical care  Unit at Time of Referral  ICU  Palliative Care Primary Diagnosis  Sepsis/Infectious Disease  Date Notified  12/09/17  Palliative Care Type  New Palliative care  Reason for referral  Clarify Goals of Care  Date of Admission  11/29/17  Date first seen by Palliative Care  12/10/17  # of days Palliative referral response time  1 Day(s)  # of days IP prior to Palliative referral  10  Clinical Assessment  Psychosocial & Spiritual Assessment  Palliative Care Outcomes  Patient/Family meeting held?  No      Time In: 1400 Time Out: 1600 Time Total: 120 minutes Greater than 50%  of this time was spent counseling and coordinating care  related to the above assessment and plan.  Juel Burrow, DNP, AGNP-C Palliative Medicine Team [phone] Pager: [phone]

## 2017-12-26 NOTE — Progress Notes (Signed)
Name: Marc [name] MRN: [id number] DOB: 07-01-1940     CONSULTATION DATE: 11/29/2017  Subjective & Objective: No active bleeding.   PAST MEDICAL HISTORY :   has a past medical history of Anxiety, BPH (benign prostatic hyperplasia), Colon adenomas, Diverticulosis, Hyperlipidemia, and Hypertension.  has a past surgical history that includes Cholecystectomy; Colonoscopy with propofol (N/A, 01/23/2016); Esophagogastroduodenoscopy (egd) with propofol (N/A, 11/27/2017); Tracheostomy tube placement (N/A, 12/13/2017); PEG placement (N/A, 12/17/2017); Coronary/Graft Acute MI Revascularization (N/A, 12/18/2017); LEFT HEART CATH AND CORONARY ANGIOGRAPHY (N/A, 12/18/2017); and CORONARY STENT INTERVENTION (N/A, 12/18/2017). Prior to Admission medications   Medication Sig Start Date End Date Taking? Authorizing Provider  carvedilol (COREG) 25 MG tablet Take 25 mg by mouth 2 (two) times daily with a meal.   Yes [provider]  megestrol (MEGACE) 40 MG/ML suspension Take 400 mg by mouth 2 (two) times daily.    Yes [provider]  amLODipine (NORVASC) 5 MG tablet Take 1 tablet (5 mg total) by mouth daily. Patient not taking: Reported on 11/29/2017 02/18/15   Loletha Grayer, MD  carbamide peroxide [facility]) 6.5 % otic solution Place 5 drops into the right ear 2 (two) times daily. Patient not taking: Reported on 11/29/2017 02/18/15   Loletha Grayer, MD  imipramine (TOFRANIL) 50 MG tablet Take 50 mg by mouth at bedtime.    [provider]  meclizine (ANTIVERT) 25 MG tablet Take 1 tablet (25 mg total) by mouth 3 (three) times daily. Patient not taking: Reported on 11/29/2017 02/18/15   Loletha Grayer, MD  pantoprazole (PROTONIX) 40 MG tablet Take 1 tablet (40 mg total) by mouth 2 (two) times daily for 14 days. 11/29/17 12/13/17  Carrie Mew, MD   Allergies  Allergen Reactions  . Lipitor [Atorvastatin Calcium] Other (See Comments)    Muscle Weakness and muscle soreness.  . Glimepiride    . Tape Itching and Rash    FAMILY HISTORY:  family history includes Hypertension in his other. SOCIAL HISTORY:  reports that he has never smoked. He has never used smokeless tobacco. He reports that he does not drink alcohol or use drugs.  REVIEW OF SYSTEMS:   Unable to obtain due to critical illness   VITAL SIGNS: Temp:  [97.9 F (36.6 C)-98.2 F (36.8 C)] 98.2 F (36.8 C) (08/29 1200) Pulse Rate:  [63-124] 88 (08/29 1400) Resp:  [15-29] 18 (08/29 1400) BP: (90-164)/(67-106) 127/84 (08/29 1400) SpO2:  [84 %-100 %] 98 % (08/29 1525) FiO2 (%):  [40 %-60 %] 40 % (08/29 1525)  Physical Examination:  Awake, oriented and no focal motor deficits On vent, trach, no distress, active bleeding at trach site and upper airway. BEAE and no rales S1 & S2 audible and no murmur Benign abdominal exam, PEG in place with coffee ground aspirate and normal peristalses bil pedal edema   ASSESSMENT / PLAN: *Ventilator dependent respiratory failure status post trach and PEG.  FIO2 to 40%. 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  -Didn't tolerate CT angio because of PEA arrest  *PEA cardiac arrest on 12/25/2017 *STEMI on 8/22/2019status post DES to RCA, P A. fib with controlled rate -Aspirin + Plavix + statin +off apixaban + amiodarone  *Coagulopathy due to Apixaban. -d/c Apixaban, received FFP + Kcentra+Vit K -Monitor coags  *Anemia with blood loss -Keep HB > 8 gm/dl.  *Aspiration with upper airway bleeding. -Empiric Unasyn -Monitor CXR.  *GI bleeding is ruled out  by GI. -PPI   *Hematurea (improved)  with no acute retention -Foley and monitor urine out put  *MRSA pneumonia status post treatment.Bibasilar airspace disease with pleural effusion -Monitor chest x-[place] + CBC + FiO2 -Consider thoracentesis for worsening pleural effusion to improve lung  compliance  *Altered mental status with restlessness(improved and following simple commands)on Seroquel and Klonopin. No acute intracranial abnormalities and CT head -Monitor neuro status  *Prerenal azotemia with intravascular volume depletion -Optimize volume, monitor renal panel and urine output  *Diabetes mellitus -Glycemic control  *DVT right arm  -Off Apixaban because of bleeding  DNR  *DVT &GI prophylaxis. Continue with supportive care  Critical care time 40 minutes

## 2017-12-26 NOTE — Evaluation (Signed)
Physical Therapy Evaluation Patient Details Name: MARQUS MACPHEE MRN: [id number] DOB: 1940-08-11 Today's Date: 12/26/2017   History of Present Illness  77 y.o. male admitted on 11/29/2017 with sepsis no presumed menigitis p/w AMS, lethargy, generalized weakness. 77 y.o. male PMH of HTN, HLD. Intubated 11/30/17 for inability to protect airway/unresponsiveness, extubated 12/05/17, DVT RUE placed on heparin drip.  Clinical Impression  Patient is a 77 year old male who lives with his wife and is independent with use of a SPC at baseline.  He is very lethargic and unable to respond verbally to questions due to having been recently extubated but he is able to respond to questions by nodding or mouthing response.  He reported no pain throughout evaluation.  Pt participated in supine AAROM and MMT for ankle and knee mobility and then became somnolent again.  Bed mobility was deferred for this reason. He presented with 3+/5 strength and good hip abduction ROM during PROM. PT discussed PROM and AAROM schedule with pt's family and emphasized importance of positioning for prevention of pressure ulcers.  Family expressed understanding.  Pt will benefit from skilled PT with focus on strength, ROM and return to safe functional mobility.  Due to increased medical needs and pt's interest in PT and return to PLOF, PT is recommending LTACH for discharge planning at this time.    Follow Up Recommendations LTACH    Equipment Recommendations  None recommended by PT(TBD at next venue of care.)    Recommendations for Other Services       Precautions / Restrictions Precautions Precautions: Fall Restrictions Weight Bearing Restrictions: No      Mobility  Bed Mobility Overal bed mobility: (Did not perform due to pt's level of consciousness.)                Transfers                    Ambulation/Gait                Stairs            Wheelchair Mobility    Modified Rankin (Stroke  Patients Only)       Balance Overall balance assessment: (Not tested)                                           Pertinent Vitals/Pain Pain Assessment: Faces Pain Score: 0-No pain    Home Living Family/patient expects to be discharged to:: Private residence Living Arrangements: Spouse/significant other Available Help at Discharge: Family Type of Home: House Home Access: Stairs to enter Entrance Stairs-Rails: Can reach both Entrance Stairs-Number of Steps: 2 or 5 Home Layout: One level Home Equipment: Cane - single point;Walker - 2 wheels;Bedside commode;Shower seat      Prior Function Level of Independence: Independent with assistive device(s)         Comments: in the last month, per family, patient has had  a decrease in functional mobility. Uses RW and SPC as needed. Prior to 1 month ago patient independent.      Hand Dominance   Dominant Hand: Right    Extremity/Trunk Assessment   Upper Extremity Assessment Upper Extremity Assessment: Generalized weakness    Lower Extremity Assessment Lower Extremity Assessment: Generalized weakness RLE Deficits / Details: Ankle PF/DF, knee flexion/extension: grossly 3+/5 bilaterally.  Pt did not respond with resistance or assistance  in supine hip abduction but demonstrated good ROM. LLE Deficits / Details: Ankle PF/DF, knee flexion/extension: grossly 3+/5 bilaterally.  Pt did not respond with resistance or assistance in supine hip abduction but demonstrated good ROM.    Cervical / Trunk Assessment Cervical / Trunk Assessment: (Unable to assess)  Communication   Communication: Other (comment)(Extubated, fatigued, able to attempt to verbalize, hoarse)  Cognition Arousal/Alertness: Lethargic Behavior During Therapy: WFL for tasks assessed/performed Overall Cognitive Status: Within Functional Limits for tasks assessed                                 General Comments: Followed commands  consistently during the time that pt was awake.  Per RN,  pt's medication may be cause of extreme lethargy.      General Comments      Exercises General Exercises - Lower Extremity Ankle Circles/Pumps: AAROM;20 reps;Both;Supine Heel Slides: AAROM;5 reps;Supine;Both Straight Leg Raises: PROM;Both;5 reps;Supine Other Exercises Other Exercises: Instructed family in performing PROM/AAROM daily for prevention of contractures. x7 min Other Exercises: Discussed positioning for prevention of pressure ulcers and repositioning regularly. x3 min   Assessment/Plan    PT Assessment Patient needs continued PT services  PT Problem List Decreased strength;Decreased mobility;Decreased activity tolerance       PT Treatment Interventions DME instruction;Therapeutic activities;Gait training;Therapeutic exercise;Patient/family education;Balance training;Functional mobility training;Neuromuscular re-education    PT Goals (Current goals can be found in the Care Plan section)  Acute Rehab PT Goals Patient Stated Goal: Patient wants to get up PT Goal Formulation: With patient Time For Goal Achievement: 01/02/18 Potential to Achieve Goals: Fair    Frequency Min 2X/week   Barriers to discharge        Co-evaluation               AM-PAC PT "6 Clicks" Daily Activity  Outcome Measure Difficulty turning over in bed (including adjusting bedclothes, sheets and blankets)?: A Lot Difficulty moving from lying on back to sitting on the side of the bed? : Unable Difficulty sitting down on and standing up from a chair with arms (e.g., wheelchair, bedside commode, etc,.)?: Unable Help needed moving to and from a bed to chair (including a wheelchair)?: A Lot Help needed walking in hospital room?: Total Help needed climbing 3-5 steps with a railing? : Total 6 Click Score: 8    End of Session Equipment Utilized During Treatment: Gait belt Activity Tolerance: Patient limited by fatigue;Patient limited by  lethargy Patient left: in bed;with call bell/phone within reach;with family/visitor present Nurse Communication: Mobility status PT Visit Diagnosis: Muscle weakness (generalized) (M62.81)    Time: 9480-1655 PT Time Calculation (min) (ACUTE ONLY): 26 min   Charges:   PT Evaluation $PT Eval Moderate Complexity: 1 Mod PT Treatments $Therapeutic Activity: 8-22 mins        Roxanne Gates, PT, DPT   Roxanne Gates 12/26/2017, 3:01 PM

## 2017-12-26 NOTE — Progress Notes (Signed)
Subjective: Patient remains on the ventilator.  Sleeps most of the time but does wake up and interact with family.    Objective: Current vital signs: BP (!) 156/101   Pulse 98   Temp 98.2 F (36.8 C) (Axillary)   Resp 17   Ht _0  (1.753 m)   Wt 62.5 kg   SpO2 93%   BMI 20.35 kg/m  Vital signs in last 24 hours: Temp:  [97.9 F (36.6 C)-98.2 F (36.8 C)] 98.2 F (36.8 C) (08/29 1200) Pulse Rate:  [63-124] 98 (08/29 1300) Resp:  [15-29] 17 (08/29 1300) BP: (87-164)/(59-106) 156/101 (08/29 1300) SpO2:  [84 %-100 %] 93 % (08/29 1300) FiO2 (%):  [40 %-60 %] 40 % (08/29 1100)  Intake/Output from previous day: 08/28 0701 - 08/29 0700 In: 1341.6 [I.V.:573.7; NG/GT:310; IV Piggyback:457.9] Out: 1025 [Urine:975; Stool:50] Intake/Output this shift: Total I/O In: 70.8 [I.V.:70.8] Out: -  Nutritional status:  Diet Order    None      Neurologic Exam: Mental Status: Lethargic but able to be awakened.  Follows some simple commands.   Cranial Nerves: II: Pupils equal, round, reactive to light and accommodation III,IV, VI: Visually tracks V,VII: face symmetric Motor: Moves both upper extremities weakly.    Lab Results: Basic Metabolic Panel: Recent Labs  Lab 12/20/17 0412 12/21/17 6301 12/22/17 0410 12/23/17 0433 12/24/17 0523 12/24/17 2231 12/25/17 0019 12/25/17 0436  NA 141 141 135 140 141 143 145 147*  K 3.5 4.4 3.8 3.6 3.3* 3.9 3.9 3.8  CL 107 105 102 103 103 105 106 105  CO2 _1 32 33* 33* 32  GLUCOSE 153* 171* 314* 180* 162* 117* 115* 156*  BUN 26* 28* 31* 35* 35* 39* 38* 38*  CREATININE 0.99 0.90 0.93 1.06 1.07 1.04 0.98 0.99  CALCIUM 7.8* 8.0* 8.0* 8.1* 8.5* 8.8* 8.8* 8.8*  MG 1.7 1.9 1.9  --  1.9  --   --   --   PHOS  --   --   --   --  2.9  --   --   --     Liver Function Tests: Recent Labs  Lab 12/22/17 0410 12/23/17 0433 12/25/17 0019  AST 35 32 25  ALT 40 38 27  ALKPHOS 210* 181* 153*  BILITOT 0.9 0.6 0.8  PROT 5.5* 5.4* 5.4*   ALBUMIN 2.1* 2.1* 2.9*   No results for input(s): LIPASE, AMYLASE in the last 168 hours. No results for input(s): AMMONIA in the last 168 hours.  CBC: Recent Labs  Lab 12/22/17 0410 12/24/17 0523  12/24/17 2231 12/25/17 0019 12/25/17 0436  12/25/17 1606 12/25/17 1957 12/26/17 0204 12/26/17 0614 12/26/17 1009  WBC 9.3 7.3  --  8.4 9.3 14.3*  --   --   --   --   --   --   NEUTROABS  --  5.4  --  6.6* 7.0* 12.6*  --   --   --   --   --   --   HGB 9.6* 8.7*   < > 9.1* 8.7* 11.7*   < > 9.8* 11.2* 9.6* 9.8* 9.6*  HCT 28.7* 25.5*   < > 27.0* 26.0* 34.5*   < > 28.2* 33.4* 28.2* 29.1* 29.0*  MCV 98.4 97.9  --  98.2 97.6 93.9  --   --   --   --   --   --   PLT 241 223  --  235 232 232  --   --   --   --   --   --    < > =  values in this interval not displayed.    Cardiac Enzymes: Recent Labs  Lab 12/22/17 0410 12/23/17 0433 12/25/17 0436 12/25/17 0811 12/25/17 1650  TROPONINI 5.84* 4.50* 1.61* 1.36* 1.12*    Lipid Panel: No results for input(s): CHOL, TRIG, HDL, CHOLHDL, VLDL, LDLCALC in the last 168 hours.  CBG: Recent Labs  Lab 12/25/17 1943 12/25/17 2342 12/26/17 0429 12/26/17 0723 12/26/17 1109  GLUCAP 167* 215* 204* 150* 141*    Microbiology: Results for orders placed or performed during the hospital encounter of 11/29/17  Urine culture     Status: Abnormal   Collection Time: 11/29/17 11:32 PM  Result Value Ref Range Status   Specimen Description   Final    URINE, RANDOM Performed at [facility], [address] [place], [place] [address]    Special Requests   Final    NONE Performed at [facility], [address], [place], [place] [address]    Culture (A)  Final    <10,000 COLONIES/mL INSIGNIFICANT GROWTH Performed at [facility], [place] [address], [place], [place] [address]    Report Status 12/01/2017 FINAL  Final  Blood Culture (routine x 2)     Status: None   Collection Time: 11/29/17 11:32 PM  Result Value Ref Range  Status   Specimen Description BLOOD LEFT ANTECUBITAL  Final   Special Requests   Final    BOTTLES DRAWN AEROBIC AND ANAEROBIC Blood Culture adequate volume   Culture   Final    NO GROWTH 5 DAYS Performed at [facility], [address] [place], [place] [address]    Report Status 12/04/2017 FINAL  Final  Blood Culture (routine x 2)     Status: None   Collection Time: 11/29/17 11:32 PM  Result Value Ref Range Status   Specimen Description BLOOD RIGHT ANTECUBITAL  Final   Special Requests   Final    BOTTLES DRAWN AEROBIC AND ANAEROBIC Blood Culture adequate volume   Culture   Final    NO GROWTH 5 DAYS Performed at [facility], [address] [place], [place] [address]    Report Status 12/04/2017 FINAL  Final  MRSA PCR Screening     Status: None   Collection Time: 11/30/17 11:59 AM  Result Value Ref Range Status   MRSA by PCR NEGATIVE NEGATIVE Final    Comment:        The GeneXpert MRSA Assay (FDA approved for NASAL specimens only), is one component of a comprehensive MRSA colonization surveillance program. It is not intended to diagnose MRSA infection nor to guide or monitor treatment for MRSA infections. Performed at [facility], [address] [place], [place] [address]   Culture, blood (routine x 2)     Status: None   Collection Time: 12/02/17  8:08 AM  Result Value Ref Range Status   Specimen Description BLOOD BLOOD RIGHT HAND  Final   Special Requests   Final    BOTTLES DRAWN AEROBIC AND ANAEROBIC Blood Culture adequate volume   Culture   Final    NO GROWTH 5 DAYS Performed at [facility], [address], [place], [place] [address]    Report Status 12/07/2017 FINAL  Final  Culture, blood (routine x 2)     Status: None   Collection Time: 12/02/17  8:08 AM  Result Value Ref Range Status   Specimen Description BLOOD BLOOD RIGHT WRIST  Final   Special Requests   Final    BOTTLES  DRAWN AEROBIC AND ANAEROBIC Blood Culture  results may not be optimal due to an inadequate volume of blood received in culture bottles   Culture   Final    NO GROWTH 5 DAYS Performed at [facility], [address] [place], [place] [address]    Report Status 12/07/2017 FINAL  Final  CSF culture     Status: None   Collection Time: 12/02/17  9:40 AM  Result Value Ref Range Status   Specimen Description   Final    CSF Performed at [place] [address], [place], [place] [address]    Special Requests   Final    NONE Performed at [facility], [place], [place] [address]    Gram Stain   Final    RARE WBC SEEN MODERATE RED BLOOD CELLS NO ORGANISMS SEEN    Culture   Final    NO GROWTH 3 DAYS Performed at [facility], [place] [address], [place], [place] [address]    Report Status 12/05/2017 FINAL  Final  Culture, fungus without smear     Status: None   Collection Time: 12/02/17  9:40 AM  Result Value Ref Range Status   Specimen Description   Final    CSF Performed at [facility], [address], [place], [place] [address]    Special Requests   Final    NONE Performed at [facility], [address], [place], [place] [address]    Culture   Final    NO FUNGUS ISOLATED AFTER 21 DAYS Performed at [facility], [place] [address], [place], [place] [address]    Report Status 12/23/2017 FINAL  Final  Urine Culture     Status: None   Collection Time: 12/10/17 12:42 AM  Result Value Ref Range Status   Specimen Description   Final    URINE, RANDOM Performed at [facility], [address] [place], [place] [address]    Special Requests   Final    NONE Performed at [facility], [address] [place], [place] [address]    Culture   Final    NO GROWTH Performed at [facility], [place] [address] [place], [place] [address]    Report Status 12/11/2017 FINAL  Final  CULTURE, BLOOD (ROUTINE X 2) w Reflex to ID Panel      Status: None   Collection Time: 12/10/17  2:39 AM  Result Value Ref Range Status   Specimen Description BLOOD LEFT [facility]  Final   Special Requests   Final    BOTTLES DRAWN AEROBIC AND ANAEROBIC Blood Culture adequate volume   Culture   Final    NO GROWTH 5 DAYS Performed at [facility], [address] [place], [place] [address]    Report Status 12/15/2017 FINAL  Final  CULTURE, BLOOD (ROUTINE X 2) w Reflex to ID Panel     Status: None   Collection Time: 12/10/17  2:39 AM  Result Value Ref Range Status   Specimen Description BLOOD LEFT HAND  Final   Special Requests   Final    BOTTLES DRAWN AEROBIC AND ANAEROBIC Blood Culture adequate volume   Culture   Final    NO GROWTH 5 DAYS Performed at [facility], [address] [place], [place] [address]    Report Status 12/15/2017 FINAL  Final  Culture, respiratory (non-expectorated)     Status: None   Collection Time:  12/10/17  2:55 AM  Result Value Ref Range Status   Specimen Description   Final    TRACHEAL ASPIRATE Performed at [facility], [address], [place], [place] [address]    Special Requests   Final    NONE Performed at [facility], [address] [place], [place] [address]    Gram Stain   Final    RARE WBC PRESENT,BOTH PMN AND MONONUCLEAR FEW GRAM POSITIVE COCCI IN PAIRS Performed at [facility], [place] [address], [place], [place] [address]    Culture   Final    MODERATE METHICILLIN RESISTANT STAPHYLOCOCCUS AUREUS   Report Status 12/12/2017 FINAL  Final   Organism ID, Bacteria METHICILLIN RESISTANT STAPHYLOCOCCUS AUREUS  Final      Susceptibility   Methicillin resistant staphylococcus aureus - MIC*    CIPROFLOXACIN >=8 RESISTANT Resistant     ERYTHROMYCIN >=8 RESISTANT Resistant     GENTAMICIN <=0.5 SENSITIVE Sensitive     OXACILLIN >=4 RESISTANT Resistant     TETRACYCLINE <=1 SENSITIVE Sensitive     VANCOMYCIN 1 SENSITIVE Sensitive     TRIMETH/SULFA <=10  SENSITIVE Sensitive     CLINDAMYCIN <=0.25 SENSITIVE Sensitive     RIFAMPIN <=0.5 SENSITIVE Sensitive     Inducible Clindamycin NEGATIVE Sensitive     * MODERATE METHICILLIN RESISTANT STAPHYLOCOCCUS AUREUS  Culture, respiratory (non-expectorated)     Status: None   Collection Time: 12/11/17  9:11 AM  Result Value Ref Range Status   Specimen Description   Final    SPUTUM Performed at [facility], [address], [place], [place] [address]    Special Requests   Final    NONE Performed at [facility], [address] [place], [place] [address]    Gram Stain   Final    MODERATE WBC PRESENT, PREDOMINANTLY PMN FEW GRAM POSITIVE COCCI RARE GRAM NEGATIVE RODS Performed at [facility], [place] [address] [place], [place] [address]    Culture   Final    MODERATE METHICILLIN RESISTANT STAPHYLOCOCCUS AUREUS   Report Status 12/13/2017 FINAL  Final   Organism ID, Bacteria METHICILLIN RESISTANT STAPHYLOCOCCUS AUREUS  Final      Susceptibility   Methicillin resistant staphylococcus aureus - MIC*    CIPROFLOXACIN 4 RESISTANT Resistant     ERYTHROMYCIN >=8 RESISTANT Resistant     GENTAMICIN <=0.5 SENSITIVE Sensitive     OXACILLIN >=4 RESISTANT Resistant     TETRACYCLINE <=1 SENSITIVE Sensitive     VANCOMYCIN <=0.5 SENSITIVE Sensitive     TRIMETH/SULFA <=10 SENSITIVE Sensitive     CLINDAMYCIN <=0.25 SENSITIVE Sensitive     RIFAMPIN <=0.5 SENSITIVE Sensitive     Inducible Clindamycin NEGATIVE Sensitive     * MODERATE METHICILLIN RESISTANT STAPHYLOCOCCUS AUREUS  C difficile quick scan w PCR reflex     Status: Abnormal   Collection Time: 12/11/17  5:42 PM  Result Value Ref Range Status   C Diff antigen POSITIVE (A) NEGATIVE Final   C Diff toxin NEGATIVE NEGATIVE Final   C Diff interpretation Results are indeterminate. See PCR results.  Final    Comment: Performed at [facility], [address] [place], [place] [address]  C. Diff by PCR, Reflexed      Status: None   Collection Time: 12/11/17  5:42 PM  Result Value Ref Range Status   Toxigenic C. Difficile by PCR NEGATIVE NEGATIVE Final    Comment: Patient is colonized with non toxigenic C. difficile.  May not need treatment unless significant symptoms are present. Performed at [facility], [address], [place], [place] [address]   Culture, bal-quantitative     Status: Abnormal   Collection Time: 12/14/17 10:00 AM  Result Value Ref Range Status   Specimen Description   Final    BRONCHIAL WASHINGS Performed at [facility], [address], [place], [place] [address]    Special Requests   Final    NONE Performed at [facility], [address] [place], [place] [address]    Gram Stain   Final    ABUNDANT WBC PRESENT, PREDOMINANTLY PMN NO ORGANISMS SEEN Performed at [facility], [place] [address] [place], [place] [address]    Culture (A)  Final    2,000 COLONIES/mL METHICILLIN RESISTANT STAPHYLOCOCCUS AUREUS   Report Status 12/17/2017 FINAL  Final   Organism ID, Bacteria METHICILLIN RESISTANT STAPHYLOCOCCUS AUREUS (A)  Final      Susceptibility   Methicillin resistant staphylococcus aureus - MIC*    CIPROFLOXACIN >=8 RESISTANT Resistant     ERYTHROMYCIN >=8 RESISTANT Resistant     GENTAMICIN <=0.5 SENSITIVE Sensitive     OXACILLIN >=4 RESISTANT Resistant     TETRACYCLINE <=1 SENSITIVE Sensitive     VANCOMYCIN <=0.5 SENSITIVE Sensitive     TRIMETH/SULFA <=10 SENSITIVE Sensitive     CLINDAMYCIN <=0.25 SENSITIVE Sensitive     RIFAMPIN <=0.5 SENSITIVE Sensitive     Inducible Clindamycin NEGATIVE Sensitive     * 2,000 COLONIES/mL METHICILLIN RESISTANT STAPHYLOCOCCUS AUREUS    Coagulation Studies: Recent Labs    12/24/17 1955 12/25/17 0436  LABPROT 19.0* 14.3  INR 1.61 1.12    Imaging: Dg Chest Port 1 View  Result Date: 12/25/2017 CLINICAL DATA:  77 year old male with concern for aspiration. EXAM: PORTABLE CHEST 1 VIEW COMPARISON:   Chest radiograph dated 12/24/2017 FINDINGS: Tracheostomy in similar position. Bilateral pleural effusions with associated airspace density involving the mid to lower lung field similar to prior radiograph which may represent atelectasis versus infiltrate. No pneumothorax. Stable cardiac silhouette. No acute osseous pathology. IMPRESSION: Bilateral pleural effusions with bilateral lower lobe atelectasis versus infiltrate. No significant interval change. Electronically Signed   By: Anner Crete M.D.   On: 12/25/2017 03:30   Dg Chest Port 1 View  Result Date: 12/24/2017 CLINICAL DATA:  Check tracheostomy tube EXAM: PORTABLE CHEST 1 VIEW COMPARISON:  12/24/2017 FINDINGS: Tracheostomy tube is well placed. Right-sided PICC line is again noted coiled within the left innominate vein. Cardiac shadow is stable. Persistent left retrocardiac consolidation is noted. Small effusions are noted bilaterally. Patchy perihilar infiltrates are again identified and stable. No acute bony abnormality is seen. IMPRESSION: Stable appearance of the chest when compare with the previous exam. Electronically Signed   By: Inez Catalina M.D.   On: 12/24/2017 20:13    Medications:  I have reviewed the patient's current medications. Scheduled: . amiodarone  400 mg Per Tube BID  . chlorhexidine gluconate (MEDLINE KIT)  15 mL Mouth Rinse BID  . docusate  100 mg Per Tube BID  . feeding supplement (OSMOLITE 1.5 CAL)  1,000 mL Per Tube Q24H  . feeding supplement (PRO-STAT SUGAR FREE 64)  30 mL Oral BID  . fentaNYL  75 mcg Transdermal Q72H  . fentaNYL (SUBLIMAZE) injection  50 mcg Intravenous Once  . free water  100 mL Per Tube Q8H  . insulin aspart  0-15 Units Subcutaneous Q4H  . insulin aspart  3 Units Subcutaneous Q4H  .  insulin glargine  15 Units Subcutaneous Daily  . mouth rinse  15 mL Mouth Rinse 10 times per day  . multivitamin  15 mL Per Tube Daily  . nutrition supplement (JUVEN)  1 packet Oral BID BM  . pantoprazole   40 mg Intravenous Q12H  . QUEtiapine  100 mg Oral QHS  . QUEtiapine  50 mg Per Tube q morning - 10a    Assessment/Plan: Spoke with family and critical care staff about possible diagnostic possibilities and gave the option of a trial of IVIg. Possible risks and benefits discussed.  Family does not wish to proceed at this time.     LOS: 27 days   Alexis Goodell, MD Neurology [phone] 12/26/2017  1:57 PM

## 2017-12-26 NOTE — Care Management (Addendum)
RNCM received notification from Manuela Schwartz with [facility] that patient has been denied LTAC authorization however it is recommended that a peer to peer be completed with Dr. Ellard Artis with [facility] [phone].  RNCM will share this with Dr. Soyla Murphy for [facility] authorization.  Erika with [facility] updated. Update/late note entry: Dr. Soyla Murphy left message for Dr. Amalia Hailey with [facility] around 530P 12/26/17.

## 2017-12-26 NOTE — Progress Notes (Signed)
Daily Progress Note   Patient Name: Marc [name]       Date: 12/26/2017 DOB: [birth year]  Age: 77 y.o. MRN#: [id number] Attending Physician: [name], MD Primary Care Physician: Tracie Harrier, MD Admit Date: 11/29/2017  Reason for Consultation/Follow-up: Establishing goals of care  Subjective: Patient less alert than yesterday. Family reports occasional interaction. They tell me he was able to wean for about an hour.   Length of Stay: 27  Current Medications: Scheduled Meds:  . amiodarone  400 mg Per Tube BID  . chlorhexidine gluconate (MEDLINE KIT)  15 mL Mouth Rinse BID  . docusate  100 mg Per Tube BID  . feeding supplement (OSMOLITE 1.5 CAL)  1,000 mL Per Tube Q24H  . feeding supplement (PRO-STAT SUGAR FREE 64)  30 mL Oral BID  . fentaNYL  75 mcg Transdermal Q72H  . fentaNYL (SUBLIMAZE) injection  50 mcg Intravenous Once  . free water  100 mL Per Tube Q8H  . insulin aspart  0-15 Units Subcutaneous Q4H  . insulin aspart  3 Units Subcutaneous Q4H  . insulin glargine  15 Units Subcutaneous Daily  . mouth rinse  15 mL Mouth Rinse 10 times per day  . multivitamin  15 mL Per Tube Daily  . nutrition supplement (JUVEN)  1 packet Oral BID BM  . pantoprazole  40 mg Intravenous Q12H  . QUEtiapine  100 mg Oral QHS  . QUEtiapine  50 mg Per Tube q morning - 10a    Continuous Infusions: . sodium chloride 1,000 mL (12/25/17 0358)  . sodium chloride    . ampicillin-sulbactam (UNASYN) IV 3 g (12/26/17 1232)  . dexmedetomidine (PRECEDEX) IV infusion 1.4 mcg/kg/hr (12/26/17 1000)  . phenylephrine (NEO-SYNEPHRINE) Adult infusion Stopped (12/25/17 1028)    PRN Meds: sodium chloride, sodium chloride, acetaminophen **OR** acetaminophen, bisacodyl, fentaNYL (SUBLIMAZE) injection, iopamidol,  ipratropium-albuterol, lip balm, metoprolol tartrate, ondansetron (ZOFRAN) IV, sennosides, sodium chloride flush, sodium chloride flush  Physical Exam     Constitutional: He appears lethargic. He has a sickly appearance. No distress.  frail  HENT:  Head: Normocephalic and atraumatic.  Cardiovascular: Normal rate and regular rhythm.  Pulmonary/Chest: Effort normal and breath sounds normal.  Abdominal: Soft. Bowel sounds are normal.  Musculoskeletal:       Right lower leg: He exhibits edema.       Left lower leg: He exhibits edema.  Neurological: He appears lethargic.  Skin: There is pallor.   Vital Signs: BP (!) 156/101   Pulse 98   Temp 98.2 F (36.8 C) (Axillary)   Resp 17   Ht _0  (1.753 m)   Wt 62.5 kg   SpO2 93%   BMI 20.35 kg/m  SpO2: SpO2: 93 % O2 Device: O2 Device: Ventilator O2 Flow Rate: O2 Flow Rate (L/min): 60 L/min  Intake/output summary:   Intake/Output Summary (Last 24 hours) at 12/26/2017 1342 Last data filed at 12/26/2017 1000 Gross per 24 hour  Intake 1143.13 ml  Output 750 ml  Net 393.13 ml   LBM: Last BM Date: 12/25/17 Baseline Weight: Weight: 58.1 kg Most recent weight: Weight: 62.5 kg       Palliative Assessment/Data: PPS 30%    Flowsheet Rows  Most Recent Value  Intake Tab  Referral Department  Critical care  Unit at Time of Referral  ICU  Palliative Care Primary Diagnosis  Sepsis/Infectious Disease  Date Notified  12/25/17  Palliative Care Type  Return patient Palliative Care  Reason for referral  Clarify Goals of Care  Date of Admission  11/29/17  Date first seen by Palliative Care  12/25/17  # of days Palliative referral response time  0 Day(s)  # of days IP prior to Palliative referral  26  Clinical Assessment  Palliative Performance Scale Score  30%  Psychosocial & Spiritual Assessment  Palliative Care Outcomes  Patient/Family meeting held?  Yes  Who was at the meeting?  wife, son, daughter, DIL  Palliative Care Outcomes   Clarified goals of care, Provided psychosocial or spiritual support, Changed CPR status      Patient Active Problem List   Diagnosis Date Noted  . Generalized weakness   . On mechanically assisted ventilation ([place])   . Goals of care, counseling/discussion   . Palliative care by specialist   . Pressure injury of skin 12/11/2017  . Protein-calorie malnutrition, severe 12/03/2017  . Altered mental status 11/29/2017  . Hypertensive urgency 02/16/2015  . Vertigo 02/16/2015    Palliative Care Assessment & Plan   HPI: 77 y.o. male  with past medical history of DM, HLD, HTN, colonic adenomas, anxiety, diverticulosis, and BPH admitted on 11/29/2017 with weakness, loss of appetite, AMS, and weight loss. Per patient's wife, he had been experiencing weakness and loss of appetite since January. He had lost 30-40 pounds. Patient was intubated shortly after admission and diagnosed with aspiration pna. He was extubated 8/8 and had to be reintubated 8/12 after cardiac arrest. Trach was placed 8/16 and PEG placed 8/20. Patient had STEMI 8/22. Patient experienced 2nd cardiac arrest 8/28 while getting CT scan. Hospital course has also been complicated with agitation and bleeding around trach, nose, and mouth. PMT consulted for [place].  Assessment: Follow up today with patient, wife, daughter, and son at bedside. They tell me patient is a little less interactive than yesterday but he was able to tolerate SBT for about an hour. They also tell about conversation with neurology and have decided not to try IVIG. They also tell me they plan for patient to be moved to [facility].  I asked how they felt about transfer and they tell me it is their only option apart from withdrawal of care. They are concerned about transfer. Their hope is patient can be weaned from ventilator and use a speaking valve.   Wife tells me "I thought it would end here" indicating she thought patient would pass away at the hospital instead of  transferring. Daughter agrees. Son also states "it could happen at anytime" then speaks about patient's previous cardiac arrests and now DNR.  Assisted patient with mouth care. He is attempting to speak to family and becoming agitated and frustrated due to communication barriers.  Questions addressed and emotional support provided to family.   Recommendations/Plan:  PMT will continue to follow and support family - family struggling with decisions and goals of care, they are concerned patient is suffering  Goals of Care and Additional Recommendations:  Limitations on Scope of Treatment: Full Scope Treatment  Code Status:  DNR  Prognosis:   Unable to determine  Discharge Planning:  To Be Determined  Care plan was discussed with wife, son, daughter  Thank you for allowing the Palliative Medicine Team to assist in the care  of this patient.   Total Time 25 minutes Prolonged Time Billed  no       Greater than 50%  of this time was spent counseling and coordinating care related to the above assessment and plan.  Juel Burrow, DNP, [facility] Palliative Medicine Team Team Phone # [phone]  Pager [phone]

## 2017-12-26 NOTE — Progress Notes (Addendum)
[place] [facility] [place] NAME: Marc [name]    MR#:  [id number]  DATE OF BIRTH:  10/26/1940  SUBJECTIVE:   The patient is on ventilation via trach.  He opened eyes on stimuli. REVIEW OF SYSTEMS:   Unobtainable as pt. Is confused and on Trach.    DRUG ALLERGIES:   Allergies  Allergen Reactions  . Lipitor [Atorvastatin Calcium] Other (See Comments)    Muscle Weakness and muscle soreness.  . Glimepiride   . Tape Itching and Rash    VITALS:  Blood pressure 127/84, pulse 88, temperature 98.2 F (36.8 C), temperature source Axillary, resp. rate 18, height 5\' 9"  (1.753 m), weight 62.5 kg, SpO2 98 %.  PHYSICAL EXAMINATION:  Constitutional: Pt. Lying in bed encephalopathic, lethargic and does not follow commands.  HEENT: AT, Normocephalic. S/p Trach. eyes: Conjunctivae are normal. PERRLA, no scleral icterus.  Neck: Normal ROM. Neck supple. No JVD. No tracheal deviation. CVS: RRR, S1/S2 +, no murmurs, no gallops, no carotid bruit.  Pulmonary: Effort and breath sounds normal, no stridor, rhonchi, wheezes, rales.  Abdominal: Soft. BS +,  no distension, tenderness, rebound or guarding.  PEG in situ. Musculoskeletal: Mild lower extremity edema  neuro: lethargic. Skin: Skin is warm and dry. No rash noted. Psychiatric: Sedated & lethargic.   LABORATORY PANEL:   CBC Recent Labs  Lab 12/25/17 0436  12/26/17 1358  WBC 14.3*  --   --   HGB 11.7*   < > 10.3*  HCT 34.5*   < > 30.4*  PLT 232  --   --    < > = values in this interval not displayed.   ------------------------------------------------------------------------------------------------------------------  Chemistries  Recent Labs  Lab 12/24/17 0523  12/25/17 0019 12/25/17 0436  NA 141   < > 145 147*  K 3.3*   < > 3.9 3.8  CL 103   < > 106 105  CO2 32   < > 33* 32  GLUCOSE 162*   < > 115* 156*  BUN 35*   < > 38* 38*  CREATININE 1.07   < > 0.98 0.99  CALCIUM 8.5*   < > 8.8* 8.8*   MG 1.9  --   --   --   AST  --   --  25  --   ALT  --   --  27  --   ALKPHOS  --   --  153*  --   BILITOT  --   --  0.8  --    < > = values in this interval not displayed.   ------------------------------------------------------------------------------------------------------------------  Cardiac Enzymes Recent Labs  Lab 12/25/17 0436 12/25/17 0811 12/25/17 1650  TROPONINI 1.61* 1.36* 1.12*   ------------------------------------------------------------------------------------------------------------------  RADIOLOGY:  Dg Chest Port 1 View  Result Date: 12/25/2017 CLINICAL DATA:  77 year old male with concern for aspiration. EXAM: PORTABLE CHEST 1 VIEW COMPARISON:  Chest radiograph dated 12/24/2017 FINDINGS: Tracheostomy in similar position. Bilateral pleural effusions with associated airspace density involving the mid to lower lung field similar to prior radiograph which may represent atelectasis versus infiltrate. No pneumothorax. Stable cardiac silhouette. No acute osseous pathology. IMPRESSION: Bilateral pleural effusions with bilateral lower lobe atelectasis versus infiltrate. No significant interval change. Electronically Signed   By: Anner Crete M.D.   On: 12/25/2017 03:30   Dg Chest Port 1 View  Result Date: 12/24/2017 CLINICAL DATA:  Check tracheostomy tube EXAM: PORTABLE CHEST 1 VIEW COMPARISON:  12/24/2017 FINDINGS: Tracheostomy tube is  well placed. Right-sided PICC line is again noted coiled within the left innominate vein. Cardiac shadow is stable. Persistent left retrocardiac consolidation is noted. Small effusions are noted bilaterally. Patchy perihilar infiltrates are again identified and stable. No acute bony abnormality is seen. IMPRESSION: Stable appearance of the chest when compare with the previous exam. Electronically Signed   By: Inez Catalina M.D.   On: 12/24/2017 20:13     ASSESSMENT AND PLAN:   77 year old male admitted August 2 for acute encephalopathy  and sepsis.  1.  Acute hypoxic respiratory failure: Patient initially intubated for possible aspiration pneumonia.  He was extubated however reintubated on August 12.  Patient is status post tracheostomy and PEG placement. - Vent management as per intensivist  2.  Aspiration pneumonia: Staphylococcus aureus grew from Trach cultures - pt. Has completed treatment with abx.   3.  Non-STEMI: Patient is status post inferior wall myocardial infarction status post drug-eluting stent - Continue aspirin, Plavix, Atorvastatin.  Eliquis was discontinued due to bleeding.  Not a candidate for cardiac rehab.  4.  DVT  of right upper arm - off Eliquis due to bleeding.  5.  Anemia of chronic disease due to acute blood loss Stable and no need for transfusion.  6.  AMS/Agitation - negative CT scan of the head - Neurology consultation appreciated and etiology unclear and ?? ICU delirium - continue Seroquel and Klonopin - Continue fentanyl patch off of infusion as per intensivist.  Mental status very slow to improve.    7.  Diabetes: cont. Lantus, SSI and follow BS which are stable.    8. NUTR: cont. PEG tube feeds and tolerating it.    PEA cardiac arrest while in CT scan and hypotension.  Improved. Bleeding from oropharynx and around trach site and hematuria.  Off Eliquis.  The patient received FFP, vitamin K and Kcentra.  No more active bleeding.  Very poor prognosis.  Possible LTAC placement. I discussed with the patient's son. CODE STATUS: FULL  TOTAL TIME TAKING CARE OF THIS PATIENT: 33 minutes.   POSSIBLE D/C ??, DEPENDING ON CLINICAL CONDITION and progress.   [name] M.D on 12/26/2017 at 3:38 PM  Between 7am to 6pm - Pager - [phone]  After 6pm go to [email or web address] - password [email or web address] [place] [facility]  Office  [phone]  CC: Primary care physician; Tracie Harrier, MD  Note: This dictation was prepared with Dragon dictation along with smaller phrase  technology. Any transcriptional errors that result from this process are unintentional.

## 2017-12-26 NOTE — Care Management (Signed)
MD and patient's wife in agreement to transition to [place] [facility] per [place] with [facility].  I have left message for Manuela Schwartz with [facility] [phone] for auth and transition to LTAC today.

## 2017-12-27 LAB — BLOOD GAS, ARTERIAL
ACID-BASE EXCESS: 7.9 mmol/L — AB (ref 0.0–2.0)
BICARBONATE: 32.7 mmol/L — AB (ref 20.0–28.0)
FIO2: 40
MECHVT: 500 mL
Mechanical Rate: 15
O2 SAT: 93 %
PEEP: 8 cmH2O
Patient temperature: 37
pCO2 arterial: 46 mmHg (ref 32.0–48.0)
pH, Arterial: 7.46 — ABNORMAL HIGH (ref 7.350–7.450)
pO2, Arterial: 63 mmHg — ABNORMAL LOW (ref 83.0–108.0)

## 2017-12-27 LAB — CBC WITH DIFFERENTIAL/PLATELET
Basophils Absolute: 0 10*3/uL (ref 0–0.1)
Basophils Relative: 1 %
Eosinophils Absolute: 0.2 10*3/uL (ref 0–0.7)
Eosinophils Relative: 4 %
HCT: 30.6 % — ABNORMAL LOW (ref 40.0–52.0)
Hemoglobin: 10.1 g/dL — ABNORMAL LOW (ref 13.0–18.0)
Lymphocytes Relative: 10 %
Lymphs Abs: 0.6 10*3/uL — ABNORMAL LOW (ref 1.0–3.6)
MCH: 31.3 pg (ref 26.0–34.0)
MCHC: 33.2 g/dL (ref 32.0–36.0)
MCV: 94.3 fL (ref 80.0–100.0)
Monocytes Absolute: 0.6 10*3/uL (ref 0.2–1.0)
Monocytes Relative: 10 %
Neutro Abs: 4.8 10*3/uL (ref 1.4–6.5)
Neutrophils Relative %: 75 %
Platelets: 208 10*3/uL (ref 150–440)
RBC: 3.24 MIL/uL — ABNORMAL LOW (ref 4.40–5.90)
RDW: 16.6 % — ABNORMAL HIGH (ref 11.5–14.5)
WBC: 6.4 10*3/uL (ref 3.8–10.6)

## 2017-12-27 LAB — BASIC METABOLIC PANEL
Anion gap: 4 — ABNORMAL LOW (ref 5–15)
BUN: 47 mg/dL — ABNORMAL HIGH (ref 8–23)
CO2: 34 mmol/L — ABNORMAL HIGH (ref 22–32)
Calcium: 8.6 mg/dL — ABNORMAL LOW (ref 8.9–10.3)
Chloride: 111 mmol/L (ref 98–111)
Creatinine, Ser: 1.02 mg/dL (ref 0.61–1.24)
GFR calc Af Amer: >60 mL/min (ref >60)
GFR calc non Af Amer: >60 mL/min (ref >60)
Glucose, Bld: 201 mg/dL — ABNORMAL HIGH (ref 70–99)
Potassium: 3.5 mmol/L (ref 3.5–5.1)
Sodium: 149 mmol/L — ABNORMAL HIGH (ref 135–145)

## 2017-12-27 LAB — GLUCOSE, CAPILLARY
GLUCOSE-CAPILLARY: 169 mg/dL — AB (ref 70–99)
Glucose-Capillary: 199 mg/dL — ABNORMAL HIGH (ref 70–99)
Glucose-Capillary: 163 mg/dL — ABNORMAL HIGH (ref 70–99)
Glucose-Capillary: 133 mg/dL — ABNORMAL HIGH (ref 70–99)
Glucose-Capillary: 93 mg/dL (ref 70–99)
Glucose-Capillary: 44 mg/dL — CL (ref 70–99)
Glucose-Capillary: 46 mg/dL — ABNORMAL LOW (ref 70–99)
Glucose-Capillary: 138 mg/dL — ABNORMAL HIGH (ref 70–99)

## 2017-12-27 MED ORDER — LORAZEPAM 2 MG/ML IJ SOLN
1.0000 mg | INTRAMUSCULAR | Status: DC | PRN
  Administered 2017-12-27 (×2): 2 mg via INTRAVENOUS
  Filled 2017-12-27 (×2): qty 1

## 2017-12-27 MED ORDER — FENTANYL CITRATE (PF) 100 MCG/2ML IJ SOLN: 25.0000 ug | Freq: Once | INTRAMUSCULAR | Status: DC

## 2017-12-27 MED ORDER — DEXTROSE 50 % IV SOLN
25.0000 mL | Freq: Once | INTRAVENOUS | Status: AC
  Administered 2017-12-27: 25 mL via INTRAVENOUS
  Filled 2017-12-27: qty 50

## 2017-12-27 NOTE — Consult Note (Signed)
Pharmacy Antibiotic Note  Marc [name] is a 77 y.o. male admitted on 11/29/2017. Pharmacy consulted for Unasyn dosing for possible aspiration PNA.    Plan: Continue Unasyn 3g Iv every 6 hours.   Height: 5\' 9"  (175.3 cm) Weight: 137 lb 12.6 oz (62.5 kg) IBW/kg (Calculated) : 70.7  Temp (24hrs), Avg:98.3 F (36.8 C), Min:98 F (36.7 C), Max:98.7 F (37.1 C)  Recent Labs  Lab 12/24/17 0523 12/24/17 2231 12/25/17 0019 12/25/17 0436 12/27/17 0517  WBC 7.3 8.4 9.3 14.3* 6.4  CREATININE 1.07 1.04 0.98 0.99 1.02    Estimated Creatinine Clearance: 55.3 mL/min (by C-G formula based on SCr of 1.02 mg/dL).    Allergies  Allergen Reactions  . Lipitor [Atorvastatin Calcium] Other (See Comments)    Muscle Weakness and muscle soreness.  . Glimepiride   . Tape Itching and Rash    Antimicrobials this admission: 8/28 Unasyn >>   Thank you for allowing pharmacy to be a part of this patient's care.  Napoleon Form, PharmD, BCPS Clinical Pharmacist 12/27/2017 3:06 PM

## 2017-12-27 NOTE — Progress Notes (Signed)
CBG found to be at 44 rehek 46.D50 25 ml IV given per protocol. Pt following commands. VSS.

## 2017-12-27 NOTE — Progress Notes (Signed)
Physical Therapy Treatment Patient Details Name: Marc [name] MRN: [id number] DOB: [birth year] Today's Date: 12/27/2017    History of Present Illness 77 y.o. male admitted on 11/29/2017 with sepsis no presumed menigitis p/w AMS, lethargy, generalized weakness. 77 y.o. male PMH of HTN, HLD. Intubated 11/30/17 for inability to protect airway/unresponsiveness, extubated 12/05/17, DVT RUE placed on heparin drip.    PT Comments    Patient was more alert and responsive today.  His breathing is slightly more labored today and HR higher and RN reported that his medication dosages have been decreased, allowing the pt to be more alert.  He was able to participate in [employer] but became agitated 4-5 exercises in, mouthing that he was experiencing pain in knees and back.  He presented with minimal participation in hip abduction and adduction but was able to achieve ankle DF/PF and knee flexion/extension.  PT provided assistance with repositioning and rolling bed mobility, which pt attempted to assist with with UE's.  PT emphasized importance of allowing family to perform PROM and AAROM and provided education to family members.  He was experiencing hypophonia due to recent extubation and PT informed speech therapists for communication suggestions as family requested Patient will continue to benefit from skilled PT with focus on strength, tolerance to activity, bed mobility and functional mobility.  Follow Up Recommendations  LTACH     Equipment Recommendations  None recommended by PT(TBD at next venue of care.)    Recommendations for Other Services       Precautions / Restrictions Precautions Precautions: Fall Restrictions Weight Bearing Restrictions: No    Mobility  Bed Mobility Overal bed mobility: Needs Assistance Bed Mobility: Rolling Rolling: Total assist         General bed mobility comments: Pt attempted to assist by moving UE's but was +2 total A to roll to R and L.  Assisted RN in positioning  pt with pillows for pressure ulcer prevention.  Transfers Overall transfer level: (Deferred )                  Ambulation/Gait                 Stairs             Wheelchair Mobility    Modified Rankin (Stroke Patients Only)       Balance Overall balance assessment: (Not tested)                                          Cognition Arousal/Alertness: Awake/alert Behavior During Therapy: WFL for tasks assessed/performed Overall Cognitive Status: Within Functional Limits for tasks assessed                                 General Comments: Pt is more alert today.  According to RN, this is due to decrease in medication dosage.      Exercises General Exercises - Lower Extremity Ankle Circles/Pumps: Both;Supine;AROM;10 reps Heel Slides: AAROM;Supine;Both;10 reps Hip ABduction/ADduction: AAROM;10 reps;Supine Straight Leg Raises: PROM Shoulder Exercises Shoulder Flexion: AAROM;10 reps;Right;Left(AAROM of R UE, Pt able to perform AROM with L UE.) Other Exercises Other Exercises: Educated family and pt about importance of frequent ther ex.  Encouraged pt to participate.  x4 min    General Comments        Pertinent Vitals/Pain Pain  Assessment: Faces Faces Pain Scale: Hurts little more Pain Location: Pt nodded to confirm back pain when being repositioned.    Home Living                      Prior Function            PT Goals (current goals can now be found in the care plan section) Acute Rehab PT Goals Patient Stated Goal: Patient wants to get up PT Goal Formulation: With patient Time For Goal Achievement: 12/19/17 Potential to Achieve Goals: Good Progress towards PT goals: Progressing toward goals    Frequency    Min 2X/week      PT Plan Current plan remains appropriate    Co-evaluation              AM-PAC PT "6 Clicks" Daily Activity  Outcome Measure  Difficulty turning over in bed  (including adjusting bedclothes, sheets and blankets)?: A Lot Difficulty moving from lying on back to sitting on the side of the bed? : Unable Difficulty sitting down on and standing up from a chair with arms (e.g., wheelchair, bedside commode, etc,.)?: Unable Help needed moving to and from a bed to chair (including a wheelchair)?: A Lot Help needed walking in hospital room?: Total Help needed climbing 3-5 steps with a railing? : Total 6 Click Score: 8    End of Session   Activity Tolerance: Patient limited by fatigue;Patient limited by lethargy Patient left: in bed;with call bell/phone within reach;with family/visitor present;with nursing/sitter in room Nurse Communication: Mobility status PT Visit Diagnosis: Muscle weakness (generalized) (M62.81)     Time: 3299-2426 PT Time Calculation (min) (ACUTE ONLY): 23 min  Charges:  $Therapeutic Exercise: 8-22 mins $Therapeutic Activity: 8-22 mins                     Roxanne Gates, PT, DPT    Roxanne Gates 12/27/2017, 11:46 AM

## 2017-12-27 NOTE — Care Management (Signed)
RNCM notified by Doroteo Bradford with [facility] that they do not have staff available until tomorrow to meet this patient's needs and have deferred transfer to [place] to tomorrow 11/28/2017.  Doroteo Bradford will update family and care team.  RNCM has notified weekedn RNCM.

## 2017-12-27 NOTE — Progress Notes (Addendum)
Name: Marc [name] MRN: [id number] DOB: 1941/03/30     CONSULTATION DATE: 11/29/2017  Subjective & Objectives: Patient remains on vent, requires Precedex for restlessness  PAST MEDICAL HISTORY :   has a past medical history of Anxiety, BPH (benign prostatic hyperplasia), Colon adenomas, Diverticulosis, Hyperlipidemia, and Hypertension.  has a past surgical history that includes Cholecystectomy; Colonoscopy with propofol (N/A, 01/23/2016); Esophagogastroduodenoscopy (egd) with propofol (N/A, 11/27/2017); Tracheostomy tube placement (N/A, 12/13/2017); PEG placement (N/A, 12/17/2017); Coronary/Graft Acute MI Revascularization (N/A, 12/18/2017); LEFT HEART CATH AND CORONARY ANGIOGRAPHY (N/A, 12/18/2017); and CORONARY STENT INTERVENTION (N/A, 12/18/2017). Prior to Admission medications   Medication Sig Start Date End Date Taking? Authorizing Provider  carvedilol (COREG) 25 MG tablet Take 25 mg by mouth 2 (two) times daily with a meal.   Yes [provider]  megestrol (MEGACE) 40 MG/ML suspension Take 400 mg by mouth 2 (two) times daily.    Yes [provider]  amLODipine (NORVASC) 5 MG tablet Take 1 tablet (5 mg total) by mouth daily. Patient not taking: Reported on 11/29/2017 02/18/15   Loletha Grayer, MD  carbamide peroxide [facility]) 6.5 % otic solution Place 5 drops into the right ear 2 (two) times daily. Patient not taking: Reported on 11/29/2017 02/18/15   Loletha Grayer, MD  imipramine (TOFRANIL) 50 MG tablet Take 50 mg by mouth at bedtime.    [provider]  meclizine (ANTIVERT) 25 MG tablet Take 1 tablet (25 mg total) by mouth 3 (three) times daily. Patient not taking: Reported on 11/29/2017 02/18/15   Loletha Grayer, MD  pantoprazole (PROTONIX) 40 MG tablet Take 1 tablet (40 mg total) by mouth 2 (two) times daily for 14 days. 11/29/17 12/13/17  Carrie Mew, MD   Allergies  Allergen Reactions  . Lipitor [Atorvastatin Calcium] Other (See Comments)    Muscle  Weakness and muscle soreness.  . Glimepiride   . Tape Itching and Rash    FAMILY HISTORY:  family history includes Hypertension in his other. SOCIAL HISTORY:  reports that he has never smoked. He has never used smokeless tobacco. He reports that he does not drink alcohol or use drugs.  REVIEW OF SYSTEMS:   Unable to obtain due to critical illness   VITAL SIGNS: Temp:  [98 F (36.7 C)-98.7 F (37.1 C)] 98.5 F (36.9 C) (08/30 0700) Pulse Rate:  [62-108] 91 (08/30 1000) Resp:  [15-26] 21 (08/30 1000) BP: (109-156)/(38-107) 133/106 (08/30 1000) SpO2:  [93 %-99 %] 98 % (08/30 1000) FiO2 (%):  [40 %-45 %] 45 % (08/30 0825)   Physical Examination Awake, oriented and no focal motor deficits On vent, trach, no distress, active bleeding at trach site and upper airway. BEAE and no rales S1 & S2 audible and no murmur Benign abdominal exam, PEG in place with coffee ground aspirate and normal peristalses bil pedal edema   ASSESSMENT / PLAN: *Ventilator dependent respiratory failure status post trach and PEG.  FIO2 to 40%. -Monitor ABG and optimize vent setting -Trach and vent weaning as tolerated -No more bleeding at the trach site.   *PEA cardiac arrest on 12/25/2017 *STEMI on 8/22/2019status post DES to RCA, P A. fib with controlled rate -Aspirin + Plavix + statin +offapixaban + amiodarone  *Coagulopathy due to Apixaban. -d/c Apixaban, received FFP + Kcentra+Vit K -Monitor coags  *Anemiawith blood loss -Keep HB > 8 gm/dl.   *GI bleeding is ruled out by GI. -PPI   *Hematurea (improved)  with no acute retention -Foley and monitor urine out  put  *MRSA pneumonia status post treatment.Bibasilar airspace disease with pleural effusion *Aspiration with upper airway bleeding. -Empiric Unasyn -Monitor chest x-Diante + CBC + FiO2 -Consider thoracentesis for worsening pleural effusion to improve lung compliance  *Altered mental status with restlessness(improved  and following simple commands)on Seroquel and Klonopin. PRN Precedes for restlessness. No acute intracranial abnormalities and CT head -Monitor neuro status  *Prerenal azotemia with intravascular volume depletion -Optimize volume, monitor renal panel and urine output  *Diabetes mellitus -Glycemic control  *DVT right arm -Off Apixabanbecause of bleeding  DNR  *DVT &GI prophylaxis. Continue with supportive care  Plan to discharge to LTAC.  Critical care time46minutes

## 2017-12-27 NOTE — Progress Notes (Addendum)
[place] at [place] NAME: Marc [name]    MR#:  [id number]  DATE OF BIRTH:  1941/04/21  SUBJECTIVE:   The patient is more awake, on ventilation via trach. REVIEW OF SYSTEMS:   Unobtainable as pt. Is confused and on Trach.    DRUG ALLERGIES:   Allergies  Allergen Reactions  . Lipitor [Atorvastatin Calcium] Other (See Comments)    Muscle Weakness and muscle soreness.  . Glimepiride   . Tape Itching and Rash    VITALS:  Blood pressure (!) 169/112, pulse (!) 116, temperature 98.5 F (36.9 C), resp. rate (!) 23, height 5\' 9"  (1.753 m), weight 62.5 kg, SpO2 95 %.  PHYSICAL EXAMINATION:  Constitutional: Pt. Lying in bed NAD and does not follow commands.  HEENT: AT, Normocephalic. S/p Trach. eyes: Conjunctivae are normal. PERRLA, no scleral icterus.  Neck: Normal ROM. Neck supple. No JVD. No tracheal deviation. CVS: RRR, S1/S2 +, no murmurs, no gallops, no carotid bruit.  Pulmonary: Effort and breath sounds normal, no stridor, rhonchi, wheezes, rales.  Abdominal: Soft. BS +,  no distension, tenderness, rebound or guarding.  PEG in situ. Musculoskeletal: Mild lower extremity edema  neuro: more awake, not follow commands. Skin: Skin is warm and dry. No rash noted. Psychiatric: more awake.  LABORATORY PANEL:   CBC Recent Labs  Lab 12/27/17 0517  WBC 6.4  HGB 10.1*  HCT 30.6*  PLT 208   ------------------------------------------------------------------------------------------------------------------  Chemistries  Recent Labs  Lab 12/24/17 0523  12/25/17 0019  12/27/17 0517  NA 141   < > 145   < > 149*  K 3.3*   < > 3.9   < > 3.5  CL 103   < > 106   < > 111  CO2 32   < > 33*   < > 34*  GLUCOSE 162*   < > 115*   < > 201*  BUN 35*   < > 38*   < > 47*  CREATININE 1.07   < > 0.98   < > 1.02  CALCIUM 8.5*   < > 8.8*   < > 8.6*  MG 1.9  --   --   --   --   AST  --   --  25  --   --   ALT  --   --  27  --   --   ALKPHOS  --    --  153*  --   --   BILITOT  --   --  0.8  --   --    < > = values in this interval not displayed.   ------------------------------------------------------------------------------------------------------------------  Cardiac Enzymes Recent Labs  Lab 12/25/17 0436 12/25/17 0811 12/25/17 1650  TROPONINI 1.61* 1.36* 1.12*   ------------------------------------------------------------------------------------------------------------------  RADIOLOGY:  No results found.   ASSESSMENT AND PLAN:   77 year old male admitted August 2 for acute encephalopathy and sepsis.  1.  Acute hypoxic respiratory failure: Patient initially intubated for possible aspiration pneumonia.  He was extubated however reintubated on August 12.  Patient is status post tracheostomy and PEG placement. - Vent management as per intensivist  2.  Aspiration pneumonia: Staphylococcus aureus grew from Trach cultures - pt. Has completed treatment with abx.   3.  Non-STEMI: Patient is status post inferior wall myocardial infarction status post drug-eluting stent - Continue aspirin, Plavix, Atorvastatin.  Eliquis was discontinued due to bleeding.  Not a candidate for cardiac rehab.  4.  DVT  of right upper arm - off Eliquis due to bleeding.  5.  Anemia of chronic disease due to acute blood loss Stable and no need for transfusion.  6.  AMS/Agitation - negative CT scan of the head - Neurology consultation appreciated and etiology unclear and ?? ICU delirium - continue Seroquel and Klonopin - Continue fentanyl patch off of infusion as per intensivist.  Mental status very slow to improve.    7.  Diabetes: cont. Lantus, SSI and follow BS which are stable.    8. NUTR: cont. PEG tube feeds and tolerating it.    PEA cardiac arrest while in CT scan and hypotension.  Improved. Bleeding from oropharynx and around trach site and hematuria.  Off Eliquis.  The patient received FFP, vitamin K and Kcentra.  No more active  bleeding.  Very poor prognosis. I discussed with the patient's son. CODE STATUS: DNR.  TOTAL TIME TAKING CARE OF THIS PATIENT: 32 minutes.   Per manager, she informed Dr. Soyla Murphy for D/C to [facility] today.  [name] M.D on 12/27/2017 at 1:49 PM  Between 7am to 6pm - Pager - [phone]  After 6pm go to [email or web address] - password [email or web address] [place] [facility]  Office  [phone]  CC: Primary care physician; Tracie Harrier, MD  Note: This dictation was prepared with Dragon dictation along with smaller phrase technology. Any transcriptional errors that result from this process are unintentional.

## 2017-12-27 NOTE — Care Management (Addendum)
RNCM left message with Manuela Schwartz at [facility] for update on authorization. Update: third call from [place] with [facility] requesting Peer to Peer. RNCM explained that telephone number I was given yesterday for report was [phone]. Per Crystal today the contact number is [phone]. Dr. Soyla Murphy updated that peer to peer requested before noon per [place] with [facility].

## 2017-12-27 NOTE — Care Management (Addendum)
Patient has been authorized to go to [facility] today. Auth: [id number]. Unit clerk updated on packet need. MD updated on EMTALA and discharge order need. Erika with [facility] also updated. Patient will transfer to [facility] via [place]. Message left for patient's wife; Octavia Bruckner patient's son aware and agrees.

## 2017-12-28 ENCOUNTER — Ambulatory Visit (HOSPITAL_COMMUNITY)
Admission: AD | Admit: 2017-12-28 | Discharge: 2017-12-28 | Disposition: A | Discharge status: Home / Self Care | Payer: PPO | Source: Other Acute Inpatient Hospital | Attending: Internal Medicine | Admitting: Internal Medicine

## 2017-12-28 ENCOUNTER — Inpatient Hospital Stay
Admission: RE | Admit: 2017-12-28 | Discharge: 2018-01-28 | Disposition: E | Payer: PPO | Attending: Internal Medicine | Admitting: Internal Medicine

## 2017-12-28 ENCOUNTER — Other Ambulatory Visit (HOSPITAL_COMMUNITY): Payer: PPO

## 2017-12-28 DIAGNOSIS — Z8674 Personal history of sudden cardiac arrest: Secondary | ICD-10-CM | POA: Diagnosis not present

## 2017-12-28 DIAGNOSIS — I469 Cardiac arrest, cause unspecified: Secondary | ICD-10-CM | POA: Diagnosis not present

## 2017-12-28 DIAGNOSIS — E785 Hyperlipidemia, unspecified: Secondary | ICD-10-CM | POA: Diagnosis not present

## 2017-12-28 DIAGNOSIS — Z951 Presence of aortocoronary bypass graft: Secondary | ICD-10-CM | POA: Diagnosis not present

## 2017-12-28 DIAGNOSIS — J90 Pleural effusion, not elsewhere classified: Secondary | ICD-10-CM

## 2017-12-28 DIAGNOSIS — I214 Non-ST elevation (NSTEMI) myocardial infarction: Secondary | ICD-10-CM | POA: Diagnosis not present

## 2017-12-28 DIAGNOSIS — E119 Type 2 diabetes mellitus without complications: Secondary | ICD-10-CM | POA: Diagnosis not present

## 2017-12-28 DIAGNOSIS — J9501 Hemorrhage from tracheostomy stoma: Secondary | ICD-10-CM | POA: Diagnosis not present

## 2017-12-28 DIAGNOSIS — N400 Enlarged prostate without lower urinary tract symptoms: Secondary | ICD-10-CM | POA: Diagnosis not present

## 2017-12-28 DIAGNOSIS — R4182 Altered mental status, unspecified: Secondary | ICD-10-CM | POA: Insufficient documentation

## 2017-12-28 DIAGNOSIS — R401 Stupor: Secondary | ICD-10-CM | POA: Diagnosis not present

## 2017-12-28 DIAGNOSIS — D649 Anemia, unspecified: Secondary | ICD-10-CM | POA: Diagnosis not present

## 2017-12-28 DIAGNOSIS — R918 Other nonspecific abnormal finding of lung field: Secondary | ICD-10-CM | POA: Diagnosis not present

## 2017-12-28 DIAGNOSIS — I480 Paroxysmal atrial fibrillation: Secondary | ICD-10-CM | POA: Diagnosis not present

## 2017-12-28 DIAGNOSIS — T45515D Adverse effect of anticoagulants, subsequent encounter: Secondary | ICD-10-CM | POA: Diagnosis not present

## 2017-12-28 DIAGNOSIS — Z9911 Dependence on respirator [ventilator] status: Secondary | ICD-10-CM | POA: Diagnosis not present

## 2017-12-28 DIAGNOSIS — F419 Anxiety disorder, unspecified: Secondary | ICD-10-CM | POA: Diagnosis not present

## 2017-12-28 DIAGNOSIS — I5021 Acute systolic (congestive) heart failure: Secondary | ICD-10-CM | POA: Diagnosis not present

## 2017-12-28 DIAGNOSIS — Z95828 Presence of other vascular implants and grafts: Secondary | ICD-10-CM

## 2017-12-28 DIAGNOSIS — Z452 Encounter for adjustment and management of vascular access device: Secondary | ICD-10-CM | POA: Diagnosis not present

## 2017-12-28 DIAGNOSIS — E870 Hyperosmolality and hypernatremia: Secondary | ICD-10-CM | POA: Diagnosis not present

## 2017-12-28 DIAGNOSIS — Z794 Long term (current) use of insulin: Secondary | ICD-10-CM | POA: Diagnosis not present

## 2017-12-28 DIAGNOSIS — G9340 Encephalopathy, unspecified: Secondary | ICD-10-CM | POA: Diagnosis not present

## 2017-12-28 DIAGNOSIS — Z431 Encounter for attention to gastrostomy: Secondary | ICD-10-CM | POA: Diagnosis not present

## 2017-12-28 DIAGNOSIS — J9690 Respiratory failure, unspecified, unspecified whether with hypoxia or hypercapnia: Secondary | ICD-10-CM | POA: Diagnosis present

## 2017-12-28 DIAGNOSIS — Z955 Presence of coronary angioplasty implant and graft: Secondary | ICD-10-CM | POA: Diagnosis not present

## 2017-12-28 DIAGNOSIS — J15212 Pneumonia due to Methicillin resistant Staphylococcus aureus: Secondary | ICD-10-CM | POA: Diagnosis not present

## 2017-12-28 DIAGNOSIS — Z931 Gastrostomy status: Secondary | ICD-10-CM

## 2017-12-28 DIAGNOSIS — I82621 Acute embolism and thrombosis of deep veins of right upper extremity: Secondary | ICD-10-CM | POA: Diagnosis not present

## 2017-12-28 DIAGNOSIS — Z4682 Encounter for fitting and adjustment of non-vascular catheter: Secondary | ICD-10-CM | POA: Diagnosis not present

## 2017-12-28 DIAGNOSIS — D6832 Hemorrhagic disorder due to extrinsic circulating anticoagulants: Secondary | ICD-10-CM | POA: Diagnosis not present

## 2017-12-28 DIAGNOSIS — I82629 Acute embolism and thrombosis of deep veins of unspecified upper extremity: Secondary | ICD-10-CM | POA: Diagnosis not present

## 2017-12-28 DIAGNOSIS — R392 Extrarenal uremia: Secondary | ICD-10-CM | POA: Diagnosis not present

## 2017-12-28 DIAGNOSIS — J9621 Acute and chronic respiratory failure with hypoxia: Secondary | ICD-10-CM

## 2017-12-28 DIAGNOSIS — R319 Hematuria, unspecified: Secondary | ICD-10-CM | POA: Diagnosis not present

## 2017-12-28 DIAGNOSIS — I213 ST elevation (STEMI) myocardial infarction of unspecified site: Secondary | ICD-10-CM | POA: Diagnosis not present

## 2017-12-28 DIAGNOSIS — R0602 Shortness of breath: Secondary | ICD-10-CM | POA: Diagnosis not present

## 2017-12-28 DIAGNOSIS — D500 Iron deficiency anemia secondary to blood loss (chronic): Secondary | ICD-10-CM | POA: Diagnosis not present

## 2017-12-28 DIAGNOSIS — Z9889 Other specified postprocedural states: Secondary | ICD-10-CM

## 2017-12-28 HISTORY — DX: Non-ST elevation (NSTEMI) myocardial infarction: I21.4

## 2017-12-28 HISTORY — DX: Cardiac arrest, cause unspecified: I46.9

## 2017-12-28 HISTORY — DX: Acute systolic (congestive) heart failure: I50.21

## 2017-12-28 LAB — CBC
HCT: 29.6 % — ABNORMAL LOW (ref 40.0–52.0)
Hemoglobin: 9.9 g/dL — ABNORMAL LOW (ref 13.0–18.0)
MCH: 31.8 pg (ref 26.0–34.0)
MCHC: 33.3 g/dL (ref 32.0–36.0)
MCV: 95.6 fL (ref 80.0–100.0)
PLATELETS: 196 10*3/uL (ref 150–440)
RBC: 3.1 MIL/uL — ABNORMAL LOW (ref 4.40–5.90)
RDW: 16.6 % — AB (ref 11.5–14.5)
WBC: 5.7 10*3/uL (ref 3.8–10.6)

## 2017-12-28 LAB — COMPREHENSIVE METABOLIC PANEL
ALBUMIN: 2.3 g/dL — AB (ref 3.5–5.0)
ALT: 24 U/L (ref 0–44)
AST: 23 U/L (ref 15–41)
Alkaline Phosphatase: 137 U/L — ABNORMAL HIGH (ref 38–126)
Anion gap: 5 (ref 5–15)
BUN: 41 mg/dL — AB (ref 8–23)
CHLORIDE: 112 mmol/L — AB (ref 98–111)
CO2: 34 mmol/L — ABNORMAL HIGH (ref 22–32)
Calcium: 8.4 mg/dL — ABNORMAL LOW (ref 8.9–10.3)
Creatinine, Ser: 0.89 mg/dL (ref 0.61–1.24)
GFR calc Af Amer: >60 mL/min (ref >60)
GFR calc non Af Amer: >60 mL/min (ref >60)
GLUCOSE: 87 mg/dL (ref 70–99)
POTASSIUM: 3.4 mmol/L — AB (ref 3.5–5.1)
Sodium: 151 mmol/L — ABNORMAL HIGH (ref 135–145)
Total Bilirubin: 0.6 mg/dL (ref 0.3–1.2)
Total Protein: 5.1 g/dL — ABNORMAL LOW (ref 6.5–8.1)

## 2017-12-28 LAB — MAGNESIUM: MAGNESIUM: 1.9 mg/dL (ref 1.7–2.4)

## 2017-12-28 LAB — PHOSPHORUS: Phosphorus: 4.2 mg/dL (ref 2.5–4.6)

## 2017-12-28 LAB — LACTIC ACID, PLASMA: Lactic Acid, Venous: 0.9 mmol/L (ref 0.5–1.9)

## 2017-12-28 LAB — GLUCOSE, CAPILLARY
GLUCOSE-CAPILLARY: 155 mg/dL — AB (ref 70–99)
GLUCOSE-CAPILLARY: 78 mg/dL (ref 70–99)
GLUCOSE-CAPILLARY: 158 mg/dL — AB (ref 70–99)
Glucose-Capillary: 152 mg/dL — ABNORMAL HIGH (ref 70–99)

## 2017-12-28 MED ORDER — OSMOLITE 1.5 CAL PO LIQD: 1000.0000 mL | ORAL | 5 refills | Status: AC

## 2017-12-28 MED ORDER — DOCUSATE SODIUM 50 MG/5ML PO LIQD: 100.0000 mg | Freq: Two times a day (BID) | ORAL | 0 refills | Status: AC

## 2017-12-28 MED ORDER — SODIUM CHLORIDE 0.9 % IV SOLN: 3.0000 g | Freq: Three times a day (TID) | INTRAVENOUS | 0 refills | Status: AC

## 2017-12-28 MED ORDER — ADULT MULTIVITAMIN LIQUID CH: 15.0000 mL | Freq: Every day | ORAL | 0 refills | Status: AC

## 2017-12-28 MED ORDER — ROSUVASTATIN CALCIUM 20 MG PO TABS: 20.0000 mg | ORAL_TABLET | Freq: Every day | ORAL | 0 refills | Status: AC

## 2017-12-28 MED ORDER — FREE WATER: 100.0000 mL | Freq: Three times a day (TID) | 10 refills | Status: AC

## 2017-12-28 MED ORDER — AMIODARONE HCL 400 MG PO TABS: 400.0000 mg | ORAL_TABLET | Freq: Two times a day (BID) | ORAL | 0 refills | Status: AC

## 2017-12-28 MED ORDER — PANTOPRAZOLE SODIUM 40 MG IV SOLR: 40.0000 mg | Freq: Two times a day (BID) | INTRAVENOUS | 0 refills | Status: AC

## 2017-12-28 MED ORDER — INSULIN GLARGINE 100 UNIT/ML ~~LOC~~ SOLN: 15.0000 [IU] | Freq: Every day | SUBCUTANEOUS | 11 refills | Status: AC

## 2017-12-28 MED ORDER — INSULIN ASPART 100 UNIT/ML ~~LOC~~ SOLN: 0.0000 [IU] | SUBCUTANEOUS | 11 refills | Status: AC

## 2017-12-28 MED ORDER — IOPAMIDOL (ISOVUE-300) INJECTION 61%
INTRAVENOUS | Status: AC
  Administered 2017-12-28: 30 mL via GASTROSTOMY
  Filled 2017-12-28: qty 50

## 2017-12-28 MED ORDER — CHLORHEXIDINE GLUCONATE 0.12% ORAL RINSE (MEDLINE KIT): 15.0000 mL | Freq: Two times a day (BID) | OROMUCOSAL | 0 refills | Status: AC

## 2017-12-28 MED ORDER — INSULIN ASPART 100 UNIT/ML ~~LOC~~ SOLN: 3.0000 [IU] | SUBCUTANEOUS | 11 refills | Status: AC

## 2017-12-28 MED ORDER — ALUM & MAG HYDROXIDE-SIMETH 200-200-20 MG/5ML PO SUSP
30.0000 mL | ORAL | Status: DC | PRN
  Administered 2017-12-28: 30 mL via ORAL
  Filled 2017-12-28 (×2): qty 30

## 2017-12-28 MED ORDER — IPRATROPIUM-ALBUTEROL 0.5-2.5 (3) MG/3ML IN SOLN: 3.0000 mL | RESPIRATORY_TRACT | 0 refills | Status: AC | PRN

## 2017-12-28 MED ORDER — FENTANYL 75 MCG/HR TD PT72: 75.0000 ug | MEDICATED_PATCH | TRANSDERMAL | 0 refills | Status: AC

## 2017-12-28 MED ORDER — QUETIAPINE FUMARATE 100 MG PO TABS: 100.0000 mg | ORAL_TABLET | Freq: Every day | ORAL | 0 refills | Status: AC

## 2017-12-28 MED ORDER — PRO-STAT SUGAR FREE PO LIQD: 30.0000 mL | Freq: Two times a day (BID) | ORAL | 0 refills | Status: AC

## 2017-12-28 NOTE — Discharge Summary (Addendum)
Name: Marc [name] MRN: [id number] DOB: 01-15-1941     CONSULTATION DATE: 11/29/2017  Subjective & Objectives: Patient remains on vent, requires Precedex for restlessness  PAST MEDICAL HISTORY :   has a past medical history of Anxiety, BPH (benign prostatic hyperplasia), Colon adenomas, Diverticulosis, Hyperlipidemia, and Hypertension.  has a past surgical history that includes Cholecystectomy; Colonoscopy with propofol (N/A, 01/23/2016); Esophagogastroduodenoscopy (egd) with propofol (N/A, 11/27/2017); Tracheostomy tube placement (N/A, 12/13/2017); PEG placement (N/A, 12/17/2017); Coronary/Graft Acute MI Revascularization (N/A, 12/18/2017); LEFT HEART CATH AND CORONARY ANGIOGRAPHY (N/A, 12/18/2017); and CORONARY STENT INTERVENTION (N/A, 12/18/2017). Prior to Admission medications   Medication Sig Start Date End Date Taking? Authorizing Provider  carvedilol (COREG) 25 MG tablet Take 25 mg by mouth 2 (two) times daily with a meal.   Yes [provider]  megestrol (MEGACE) 40 MG/ML suspension Take 400 mg by mouth 2 (two) times daily.    Yes [provider]  amLODipine (NORVASC) 5 MG tablet Take 1 tablet (5 mg total) by mouth daily. Patient not taking: Reported on 11/29/2017 02/18/15   Loletha Grayer, MD  carbamide peroxide [facility]) 6.5 % otic solution Place 5 drops into the right ear 2 (two) times daily. Patient not taking: Reported on 11/29/2017 02/18/15   Loletha Grayer, MD  imipramine (TOFRANIL) 50 MG tablet Take 50 mg by mouth at bedtime.    [provider]  meclizine (ANTIVERT) 25 MG tablet Take 1 tablet (25 mg total) by mouth 3 (three) times daily. Patient not taking: Reported on 11/29/2017 02/18/15   Loletha Grayer, MD  pantoprazole (PROTONIX) 40 MG tablet Take 1 tablet (40 mg total) by mouth 2 (two) times daily for 14 days. 11/29/17 12/13/17  Carrie Mew, MD   Allergies  Allergen Reactions  . Lipitor [Atorvastatin Calcium] Other (See Comments)    Muscle  Weakness and muscle soreness.  . Glimepiride   . Tape Itching and Rash    FAMILY HISTORY:  family history includes Hypertension in his other. SOCIAL HISTORY:  reports that he has never smoked. He has never used smokeless tobacco. He reports that he does not drink alcohol or use drugs.  REVIEW OF SYSTEMS:   Unable to obtain due to critical illness   VITAL SIGNS: Temp:  [97.7 F (36.5 C)-98.4 F (36.9 C)] 97.7 F (36.5 C) (08/31 0400) Pulse Rate:  [61-116] 63 (08/31 0630) Resp:  [15-25] 15 (08/31 0630) BP: (104-169)/(67-112) 113/71 (08/31 0630) SpO2:  [93 %-100 %] 98 % (08/31 0630) FiO2 (%):  [45 %] 45 % (08/31 0430)   Physical Examination Awake, oriented and no focal motor deficits On vent, trach, no distress, active bleeding at trach site and upper airway. BEAE and no rales S1 & S2 audible and no murmur Benign abdominal exam, PEG in place with coffee ground aspirate and normal peristalses bil pedal edema   Hospital course of admission includes: *Ventilator dependent respiratory failure status post trach and PEG.  FIO2 to 40%. -Monitor ABG and optimize vent setting -Trach and vent weaning as tolerated -No more bleeding at the trach site.   *PEA cardiac arrest on 12/25/2017 *STEMI on 8/22/2019status post DES to RCA, P A. fib with controlled rate -Aspirin + Plavix + statin +offapixaban + amiodarone  *Coagulopathy due to Apixaban. -d/c Apixaban, received FFP + Kcentra+Vit K -Monitor coags  *Anemiawith blood loss -Keep HB > 8 gm/dl.   *GI bleeding is ruled out by GI. -PPI   *Hematurea (improved)  with no acute retention -Foley and monitor urine  out put  *MRSA pneumonia status post treatment.Bibasilar airspace disease with pleural effusion *Aspiration with upper airway bleeding. -Empiric Unasyn -Monitor chest x-Dareon + CBC + FiO2 -Consider thoracentesis for worsening pleural effusion to improve lung compliance  *Altered mental status with  restlessness(improved and following simple commands)on Seroquel and Klonopin. PRN Precedes for restlessness. No acute intracranial abnormalities and CT head -Monitor neuro status  *Prerenal azotemia with intravascular volume depletion -Optimize volume, monitor renal panel and urine output  *Diabetes mellitus -Glycemic control  *DVT right arm -Off Apixabanbecause of bleeding  DNR  *DVT &GI prophylaxis. Continue with supportive care  Plan to discharge to LTAC.

## 2017-12-28 NOTE — Progress Notes (Signed)
Report given to [facility]. Reported to [facility] RN at [facility]. [phone], accepting physician Dr Owens Shark, room number [phone]. Patient being transferred. Family is at bedside. Vitals with in normal. Dr Soyla Murphy assessed patient before transfer. No additional orders given.

## 2017-12-29 ENCOUNTER — Encounter: Payer: Self-pay | Admitting: Internal Medicine

## 2017-12-29 ENCOUNTER — Other Ambulatory Visit (HOSPITAL_COMMUNITY): Payer: PPO

## 2017-12-29 DIAGNOSIS — I469 Cardiac arrest, cause unspecified: Secondary | ICD-10-CM

## 2017-12-29 DIAGNOSIS — I82629 Acute embolism and thrombosis of deep veins of unspecified upper extremity: Secondary | ICD-10-CM

## 2017-12-29 DIAGNOSIS — I214 Non-ST elevation (NSTEMI) myocardial infarction: Secondary | ICD-10-CM

## 2017-12-29 DIAGNOSIS — J90 Pleural effusion, not elsewhere classified: Secondary | ICD-10-CM | POA: Diagnosis not present

## 2017-12-29 DIAGNOSIS — Z452 Encounter for adjustment and management of vascular access device: Secondary | ICD-10-CM | POA: Diagnosis not present

## 2017-12-29 DIAGNOSIS — Z4682 Encounter for fitting and adjustment of non-vascular catheter: Secondary | ICD-10-CM | POA: Diagnosis not present

## 2017-12-29 DIAGNOSIS — R401 Stupor: Secondary | ICD-10-CM

## 2017-12-29 DIAGNOSIS — J9621 Acute and chronic respiratory failure with hypoxia: Secondary | ICD-10-CM

## 2017-12-29 HISTORY — DX: Cardiac arrest, cause unspecified: I46.9

## 2017-12-29 HISTORY — DX: Non-ST elevation (NSTEMI) myocardial infarction: I21.4

## 2017-12-29 LAB — COMPREHENSIVE METABOLIC PANEL
ALK PHOS: 146 U/L — AB (ref 38–126)
ALT: 25 U/L (ref 0–44)
AST: 22 U/L (ref 15–41)
Albumin: 2.2 g/dL — ABNORMAL LOW (ref 3.5–5.0)
Anion gap: 7 (ref 5–15)
BILIRUBIN TOTAL: 0.8 mg/dL (ref 0.3–1.2)
BUN: 34 mg/dL — AB (ref 8–23)
CO2: 31 mmol/L (ref 22–32)
CREATININE: 1.17 mg/dL (ref 0.61–1.24)
Calcium: 8.2 mg/dL — ABNORMAL LOW (ref 8.9–10.3)
Chloride: 112 mmol/L — ABNORMAL HIGH (ref 98–111)
GFR calc Af Amer: >60 mL/min (ref >60)
GFR, EST NON AFRICAN AMERICAN: 59 mL/min — AB (ref >60)
Glucose, Bld: 207 mg/dL — ABNORMAL HIGH (ref 70–99)
Potassium: 3.6 mmol/L (ref 3.5–5.1)
Sodium: 150 mmol/L — ABNORMAL HIGH (ref 135–145)
TOTAL PROTEIN: 4.7 g/dL — AB (ref 6.5–8.1)

## 2017-12-29 LAB — BLOOD GAS, ARTERIAL
Acid-Base Excess: 6.7 mmol/L — ABNORMAL HIGH (ref 0.0–2.0)
Bicarbonate: 30.8 mmol/L — ABNORMAL HIGH (ref 20.0–28.0)
FIO2: 50
LHR: 22 {breaths}/min
O2 SAT: 90.4 %
PATIENT TEMPERATURE: 98.6
PCO2 ART: 45.2 mmHg (ref 32.0–48.0)
PEEP: 5 cmH2O
PH ART: 7.448 (ref 7.350–7.450)
VT: 500 mL
pO2, Arterial: 58.7 mmHg — ABNORMAL LOW (ref 83.0–108.0)

## 2017-12-29 LAB — CBC WITH DIFFERENTIAL/PLATELET
ABS IMMATURE GRANULOCYTES: 0.1 10*3/uL (ref 0.0–0.1)
Basophils Absolute: 0 10*3/uL (ref 0.0–0.1)
Basophils Relative: 0 %
Eosinophils Absolute: 0.2 10*3/uL (ref 0.0–0.7)
Eosinophils Relative: 2 %
HEMATOCRIT: 33.5 % — AB (ref 39.0–52.0)
Hemoglobin: 10.2 g/dL — ABNORMAL LOW (ref 13.0–17.0)
IMMATURE GRANULOCYTES: 1 %
LYMPHS ABS: 0.6 10*3/uL — AB (ref 0.7–4.0)
Lymphocytes Relative: 8 %
MCH: 30.9 pg (ref 26.0–34.0)
MCHC: 30.4 g/dL (ref 30.0–36.0)
MCV: 101.5 fL — AB (ref 78.0–100.0)
MONOS PCT: 9 %
Monocytes Absolute: 0.7 10*3/uL (ref 0.1–1.0)
NEUTROS ABS: 5.6 10*3/uL (ref 1.7–7.7)
Neutrophils Relative %: 80 %
Platelets: 186 10*3/uL (ref 150–400)
RBC: 3.3 MIL/uL — AB (ref 4.22–5.81)
RDW: 15.4 % (ref 11.5–15.5)
WBC: 7.1 10*3/uL (ref 4.0–10.5)

## 2017-12-29 LAB — C DIFFICILE QUICK SCREEN W PCR REFLEX
C DIFFICILE (CDIFF) INTERP: NOT DETECTED
C Diff antigen: NEGATIVE
C Diff toxin: NEGATIVE

## 2017-12-29 LAB — PROTIME-INR
INR: 1.37
Prothrombin Time: 16.8 seconds — ABNORMAL HIGH (ref 11.4–15.2)

## 2017-12-29 LAB — PHOSPHORUS: Phosphorus: 3.3 mg/dL (ref 2.5–4.6)

## 2017-12-29 LAB — MAGNESIUM: Magnesium: 2 mg/dL (ref 1.7–2.4)

## 2017-12-29 NOTE — Consult Note (Signed)
Pulmonary [place]  PULMONARY SERVICE  Date of Service: 12/29/2017  PULMONARY CRITICAL CARE CONSULT   Marc [name]  QBH:[id number]  DOB: 1941-02-16   DOA: 12/20/2017  Referring Physician: Merton Border, MD  HPI: Marc [name] is a 77 y.o. male seen for follow up of Acute on Chronic Respiratory Failure.  This unfortunate gentleman is transferred to [place] for further management.  Patient presented to the hospitalist service at [facility] with a history of hypertension hyperlipidemia adenomas of the colon.  Patient was undergoing a work-up altered mental status.  The patient had apparently been losing weight up to 40 pounds according to the wife.  And there had been no explanation for the weight loss.  He underwent a CT scan of the abdomen and pelvis also had an MRI EGD only showing Barrett's esophagus and gastritis.  Head CT did not reveal anything acute.  The patient was admitted to hospital and ended up having to be intubated.  This was done on 3 August.  His hospital course was very complicated he suffered a cardiac arrest and an acute myocardial infarction.  Patient was not able to come off of the ventilator and ended up having to have a tracheostomy.  He presented to our facility for further management and weaning.  At the time that he is seen he is somewhat anxious and agitated.  Family was present in the room and they are considering that this time that they might want to withdraw the care that he is receiving and make him a palliative care hospice care however they want to give it a few days.  At this time he is on the ventilator and assist control mode has been on 50% oxygen.  Review of Systems:  ROS performed and is unremarkable other than noted above.  Past Medical History:  Diagnosis Date  . Anxiety   . BPH (benign prostatic hyperplasia)   . Cardiac arrest ([place]) 12/29/2017  . Colon adenomas    history of  . Diverticulosis   .  Hyperlipidemia   . Hypertension   . NSTEMI (non-ST elevated myocardial infarction) ([place]) 12/29/2017    Past Surgical History:  Procedure Laterality Date  . CHOLECYSTECTOMY    . COLONOSCOPY WITH PROPOFOL N/A 01/23/2016   Procedure: COLONOSCOPY WITH PROPOFOL;  Surgeon: Manya Silvas, MD;  Location: [facility] ENDOSCOPY;  Service: Endoscopy;  Laterality: N/A;  . CORONARY STENT INTERVENTION N/A 12/18/2017   Procedure: CORONARY STENT INTERVENTION;  Surgeon: Yolonda Kida, MD;  Location: [place] CV LAB;  Service: Cardiovascular;  Laterality: N/A;  . CORONARY/GRAFT ACUTE MI REVASCULARIZATION N/A 12/18/2017   Procedure: Coronary/Graft Acute MI Revascularization;  Surgeon: Yolonda Kida, MD;  Location: [place] CV LAB;  Service: Cardiovascular;  Laterality: N/A;  . ESOPHAGOGASTRODUODENOSCOPY (EGD) WITH PROPOFOL N/A 11/27/2017   Procedure: ESOPHAGOGASTRODUODENOSCOPY (EGD) WITH PROPOFOL;  Surgeon: Manya Silvas, MD;  Location: [facility] ENDOSCOPY;  Service: Endoscopy;  Laterality: N/A;  . LEFT HEART CATH AND CORONARY ANGIOGRAPHY N/A 12/18/2017   Procedure: LEFT HEART CATH AND CORONARY ANGIOGRAPHY;  Surgeon: Yolonda Kida, MD;  Location: [place] CV LAB;  Service: Cardiovascular;  Laterality: N/A;  . PEG PLACEMENT N/A 12/17/2017   Procedure: PERCUTANEOUS ENDOSCOPIC GASTROSTOMY (PEG) PLACEMENT;  Surgeon: Toledo, Benay Pike, MD;  Location: [facility] ENDOSCOPY;  Service: Gastroenterology;  Laterality: N/A;  . TRACHEOSTOMY TUBE PLACEMENT N/A 12/13/2017   Procedure: TRACHEOSTOMY;  Surgeon: Clyde Canterbury, MD;  Location: [facility] ORS;  Service:  ENT;  Laterality: N/A;    Social History:    reports that he has never smoked. He has never used smokeless tobacco. He reports that he does not drink alcohol or use drugs.  Family History: Non-Contributory to the present illness  Allergies  Allergen Reactions  . Lipitor [Atorvastatin Calcium] Other (See Comments)    Muscle Weakness and muscle soreness.   . Glimepiride   . Tape Itching and Rash    Medications: Reviewed on Rounds  Physical Exam:  Vitals: Temperature is 97.4 pulse 90 respiratory rate 15 blood pressure 138/83 saturations 97%  Ventilator Settings mode of ventilation assist control FiO2 50% tidal volume 546 PEEP 5  . General: Comfortable at this time . Eyes: Grossly normal lids, irises & conjunctiva . ENT: grossly tongue is normal . Neck: no obvious mass . Cardiovascular: S1-S2 normal no gallop or rub . Respiratory: Coarse breath sounds are noted bilaterally . Abdomen: Soft and nontender . Skin: no rash seen on limited exam . Musculoskeletal: not rigid . Psychiatric:unable to assess . Neurologic: no seizure no involuntary movements         Labs on Admission:  Basic Metabolic Panel: Recent Labs  Lab 12/24/17 0523  12/25/17 0019 12/25/17 0436 12/27/17 0517 12/23/2017 0626 12/29/17 0720  NA 141   < > 145 147* 149* 151* 150*  K 3.3*   < > 3.9 3.8 3.5 3.4* 3.6  CL 103   < > 106 105 111 112* 112*  CO2 32   < > 33* 32 34* 34* 31  GLUCOSE 162*   < > 115* 156* 201* 87 207*  BUN 35*   < > 38* 38* 47* 41* 34*  CREATININE 1.07   < > 0.98 0.99 1.02 0.89 1.17  CALCIUM 8.5*   < > 8.8* 8.8* 8.6* 8.4* 8.2*  MG 1.9  --   --   --   --  1.9 2.0  PHOS 2.9  --   --   --   --  4.2 3.3   < > = values in this interval not displayed.    Recent Labs  Lab 12/25/17 0215 12/27/17 0452 12/29/17 1242  PHART 7.45 7.46* 7.448  PCO2ART 50* 46 45.2  PO2ART 66* 63* 58.7*  HCO3 34.8* 32.7* 30.8*  O2SAT 93.6 93.0 90.4    Liver Function Tests: Recent Labs  Lab 12/23/17 0433 12/25/17 0019 12/15/2017 0626 12/29/17 0720  AST 32 25 23 22   ALT 38 27 24 25   ALKPHOS 181* 153* 137* 146*  BILITOT 0.6 0.8 0.6 0.8  PROT 5.4* 5.4* 5.1* 4.7*  ALBUMIN 2.1* 2.9* 2.3* 2.2*   No results for input(s): LIPASE, AMYLASE in the last 168 hours. No results for input(s): AMMONIA in the last 168 hours.  CBC: Recent Labs  Lab 12/24/17 2231  12/25/17 0019 12/25/17 0436  12/26/17 1009 12/26/17 1358 12/27/17 0517 12/24/2017 0626 12/29/17 1256  WBC 8.4 9.3 14.3*  --   --   --  6.4 5.7 7.1  NEUTROABS 6.6* 7.0* 12.6*  --   --   --  4.8  --  5.6  HGB 9.1* 8.7* 11.7*   < > 9.6* 10.3* 10.1* 9.9* 10.2*  HCT 27.0* 26.0* 34.5*   < > 29.0* 30.4* 30.6* 29.6* 33.5*  MCV 98.2 97.6 93.9  --   --   --  94.3 95.6 101.5*  PLT 235 232 232  --   --   --  208 196 186   < > =  values in this interval not displayed.    Cardiac Enzymes: Recent Labs  Lab 12/23/17 0433 12/25/17 0436 12/25/17 0811 12/25/17 1650 12/26/17 1639  CKTOTAL  --   --   --   --  16*  TROPONINI 4.50* 1.61* 1.36* 1.12*  --     BNP (last 3 results) No results for input(s): BNP in the last 8760 hours.  ProBNP (last 3 results) No results for input(s): PROBNP in the last 8760 hours.   Radiological Exams on Admission: Dg Chest Port 1 View  Result Date: 11/30/2017 CLINICAL DATA:  Peg tube.  New admission EXAM: PORTABLE CHEST 1 VIEW COMPARISON:  12/25/2017 chest x-[place].  Abdominal CT 12/19/2017. FINDINGS: Artifact from EKG leads. A tracheostomy tube is present. There is haziness of the bilateral chest attributed to layering pleural fluid and atelectasis. No apical [employer]. There is a right upper extremity PICC with tortuous course, suspect folding in the left brachiocephalic vein. The tortuosity is above the azygos shadow. Stable heart size These results will be called to the ordering clinician or representative by the Radiologist Assistant, and communication documented in the PACS or zVision Dashboard. IMPRESSION: 1. Hazy bilateral opacity from atelectasis and layering effusions based on CT 12/19/2017. 2. Malpositioning of right upper extremity PICC which likely cannulates the distal left brachiocephalic vein. The catheter is kinked. Electronically Signed   By: Monte Fantasia M.D.   On: 12/19/2017 16:00   Dg Abd Portable 1v  Result Date: 12/06/2017 CLINICAL DATA:   Evaluate positioning of the feeding gastrostomy tube. EXAM: PORTABLE ABDOMEN - 1 VIEW COMPARISON:  CT abdomen and pelvis 12/19/2017. Abdominal x-Misha 12/19/2017 and earlier. FINDINGS: Contrast injection into the gastrostomy tube confirms appropriate positioning in the proximal gastric body. There is no evidence of contrast extravasation. Bowel gas pattern unremarkable. IMPRESSION: 1. Appropriately positioned gastrostomy tube with no evidence of leak. 2. No acute abdominal abnormality. Electronically Signed   By: Evangeline Dakin M.D.   On: 12/03/2017 16:06    Assessment/Plan Principal Problem:   Acute on chronic respiratory failure with hypoxia (HCC) Active Problems:   Altered mental status   NSTEMI (non-ST elevated myocardial infarction) ([place])   Cardiac arrest (HCC)   DVT of upper extremity (deep vein thrombosis) ([place])   1. Acute on chronic respiratory failure with hypoxia patient right now is on full vent support.  I will have respiratory therapy assess the spontaneous breathing trial if patient is able to we will try to wean him he is a little bit agitated right now so is not likely a good candidate.  In addition he has cardiac issues the last ejection fraction that was done showed an EF of about 30%.  The chest film that he has had done shows some pleural effusions which are layering. 2. Altered mental status still agitated and confused at this time.  We will continue to follow along 3. Non-STEMI has had significant loss of myocardial function.  She did have cardiology evaluation and stent placement at the other facility. 4. Cardiac arrest right now the rhythm is stable we will continue to monitor. 5. DVT apparently had some issues with bleeding so is off apixaban which he had been receiving previously 6. GI bleed monitor hemoglobin right now appears to be stable 7. Pneumonia due to MRSA treated with antibiotics continue to follow-up on x-[place] there is concern also for aspiration   I have  personally seen and evaluated the patient, evaluated laboratory and imaging results, formulated the assessment and plan and  placed orders. The Patient requires high complexity decision making for assessment and support.  Case was discussed on Rounds with the Respiratory Therapy Staff Time Spent 90minutes patient is critically ill in danger cardiac arrest case was discussed with the primary care team as well as medical staff on rounds  Allyne Gee, MD [facility] Pulmonary Critical Care Medicine Sleep Medicine

## 2017-12-29 DEATH — deceased

## 2017-12-30 ENCOUNTER — Encounter: Payer: Self-pay | Admitting: Internal Medicine

## 2017-12-30 DIAGNOSIS — I5021 Acute systolic (congestive) heart failure: Secondary | ICD-10-CM | POA: Diagnosis present

## 2017-12-30 LAB — BASIC METABOLIC PANEL
Anion gap: 8 (ref 5–15)
BUN: 26 mg/dL — ABNORMAL HIGH (ref 8–23)
CALCIUM: 8.1 mg/dL — AB (ref 8.9–10.3)
CO2: 28 mmol/L (ref 22–32)
CREATININE: 1.05 mg/dL (ref 0.61–1.24)
Chloride: 109 mmol/L (ref 98–111)
GFR calc non Af Amer: >60 mL/min (ref >60)
Glucose, Bld: 212 mg/dL — ABNORMAL HIGH (ref 70–99)
Potassium: 3 mmol/L — ABNORMAL LOW (ref 3.5–5.1)
SODIUM: 145 mmol/L (ref 135–145)

## 2017-12-30 NOTE — Progress Notes (Signed)
Pulmonary Critical Care Medicine [place]   PULMONARY CRITICAL CARE SERVICE  PROGRESS NOTE  Date of Service: 12/30/2017  Marc [name]  QXI:[id number]  DOB: Sep 02, 1940   DOA: 12/23/2017  Referring Physician: Merton Border, MD  HPI: Marc [name] is a 77 y.o. male seen for follow up of Acute on Chronic Respiratory Failure.  Patient does not show any improvement.  Remains on the ventilator is still on 60% oxygen.  PEEP was down to 5.  Family was present in the room and they were updated.  His mechanics are still very poor and is not able to do any weaning.  Suspect underlying coronary artery disease issues patient has also had stents placed and is not on any anticoagulants for the stents.  Need to see what type of stents were placed.  Medications: Reviewed on Rounds  Physical Exam:  Vitals: Temperature 98.0 pulse 67 respiratory rate 22 blood pressure 178/85 saturation 97%  Ventilator Settings mode of ventilation assist control FiO2 60% tidal volume 547 PEEP 5  . General: Comfortable at this time . Eyes: Grossly normal lids, irises & conjunctiva . ENT: grossly tongue is normal . Neck: no obvious mass . Cardiovascular: S1 S2 normal no gallop . Respiratory: Scattered rhonchi are noted bilaterally . Abdomen: soft . Skin: no rash seen on limited exam . Musculoskeletal: not rigid . Psychiatric:unable to assess . Neurologic: no seizure no involuntary movements         Lab Data:   Basic Metabolic Panel: Recent Labs  Lab 12/24/17 0523  12/25/17 0436 12/27/17 0517 12/20/2017 0626 12/29/17 0720 12/30/17 0502  NA 141   < > 147* 149* 151* 150* 145  K 3.3*   < > 3.8 3.5 3.4* 3.6 3.0*  CL 103   < > 105 111 112* 112* 109  CO2 32   < > 32 34* 34* 31 28  GLUCOSE 162*   < > 156* 201* 87 207* 212*  BUN 35*   < > 38* 47* 41* 34* 26*  CREATININE 1.07   < > 0.99 1.02 0.89 1.17 1.05  CALCIUM 8.5*   < > 8.8* 8.6* 8.4* 8.2* 8.1*  MG 1.9  --   --   --  1.9 2.0  --   PHOS 2.9   --   --   --  4.2 3.3  --    < > = values in this interval not displayed.    ABG: Recent Labs  Lab 12/25/17 0215 12/27/17 0452 12/29/17 1242  PHART 7.45 7.46* 7.448  PCO2ART 50* 46 45.2  PO2ART 66* 63* 58.7*  HCO3 34.8* 32.7* 30.8*  O2SAT 93.6 93.0 90.4    Liver Function Tests: Recent Labs  Lab 12/25/17 0019 12/16/2017 0626 12/29/17 0720  AST 25 23 22   ALT 27 24 25   ALKPHOS 153* 137* 146*  BILITOT 0.8 0.6 0.8  PROT 5.4* 5.1* 4.7*  ALBUMIN 2.9* 2.3* 2.2*   No results for input(s): LIPASE, AMYLASE in the last 168 hours. No results for input(s): AMMONIA in the last 168 hours.  CBC: Recent Labs  Lab 12/24/17 2231 12/25/17 0019 12/25/17 0436  12/26/17 1009 12/26/17 1358 12/27/17 0517 12/23/2017 0626 12/29/17 1256  WBC 8.4 9.3 14.3*  --   --   --  6.4 5.7 7.1  NEUTROABS 6.6* 7.0* 12.6*  --   --   --  4.8  --  5.6  HGB 9.1* 8.7* 11.7*   < > 9.6* 10.3* 10.1* 9.9* 10.2*  HCT 27.0* 26.0* 34.5*   < > 29.0* 30.4* 30.6* 29.6* 33.5*  MCV 98.2 97.6 93.9  --   --   --  94.3 95.6 101.5*  PLT 235 232 232  --   --   --  208 196 186   < > = values in this interval not displayed.    Cardiac Enzymes: Recent Labs  Lab 12/25/17 0436 12/25/17 0811 12/25/17 1650 12/26/17 1639  CKTOTAL  --   --   --  16*  TROPONINI 1.61* 1.36* 1.12*  --     BNP (last 3 results) No results for input(s): BNP in the last 8760 hours.  ProBNP (last 3 results) No results for input(s): PROBNP in the last 8760 hours.  Radiological Exams: Dg Chest Port 1 View  Result Date: 12/29/2017 CLINICAL DATA:  Patient status post PICC line placement. EXAM: PORTABLE CHEST 1 VIEW COMPARISON:  Chest radiograph 12/29/2017 FINDINGS: Interval insertion left upper extremity PICC line with tip projecting over the right atrium. Interval retraction right upper extremity PICC line with tip projecting at the central right brachiocephalic vein. Tracheostomy tube mid trachea. Monitoring leads overlie the patient. Stable  cardiac and mediastinal contours. Layering bilateral pleural effusions with underlying pulmonary consolidation. IMPRESSION: New left upper extremity PICC line tip projects over the right atrium. Interval retraction right upper extremity PICC line with tip projecting in the central right brachiocephalic vein. Layering large bilateral effusions and underlying opacities. Electronically Signed   By: Lovey Newcomer M.D.   On: 12/29/2017 19:16   Dg Chest Port 1 View  Result Date: 12/29/2017 CLINICAL DATA:  Bedside repositioning of previously placed RIGHT UPPER extremity PICC. EXAM: PORTABLE CHEST 1 VIEW 3:55 p.m.: COMPARISON:  Portable chest x-Jathniel earlier same day at 3:46 p.m. and previously. FINDINGS: RIGHT arm PICC unchanged in position, again looped in the midline with its tip directed laterally, likely in the LEFT innominate vein. Please see the report of the examination 9 minutes ago at 3:46 p.m. for comments on the remainder of the chest x-Win. IMPRESSION: RIGHT arm PICC tip unchanged in position, likely in the LEFT innominate vein. In order to have the tip of the PICC in the UPPER SVC or LEFT innominate vein, I recommend withdrawing the catheter approximately 5 cm. These results will be called to the ordering clinician or representative by the Radiologist Assistant, and communication documented in the PACS or zVision Dashboard. Electronically Signed   By: Evangeline Dakin M.D.   On: 12/29/2017 16:42   Dg Chest Port 1 View  Result Date: 12/29/2017 CLINICAL DATA:  Bedside repositioning of previously placed RIGHT UPPER extremity PICC. EXAM: PORTABLE CHEST 1 VIEW 3:46 p.m.: COMPARISON:  Portable chest x-rays yesterday, 12/25/2017 and earlier. FINDINGS: The RIGHT arm PICC is unchanged in position, looped in the midline with its tip directed laterally, likely in the LEFT innominate vein. Tracheostomy tube in satisfactory position below the thoracic inlet, unchanged. Airspace consolidation in the perihilar regions of  both UPPER lobes and dense consolidation in the BILATERAL lower lobes, unchanged, associated with moderate-sized BILATERAL pleural effusions. No new pulmonary parenchymal abnormalities. IMPRESSION: 1. RIGHT arm PICC unchanged in position, looped in the midline with its tip directed laterally, likely in the LEFT innominate vein. 2. Stable multilobar BILATERAL pneumonia and moderate-sized BILATERAL pleural effusions. Electronically Signed   By: Evangeline Dakin M.D.   On: 12/29/2017 16:32   Dg Chest Port 1 View  Result Date: 12/04/2017 CLINICAL DATA:  Peg tube.  New admission EXAM: PORTABLE CHEST  1 VIEW COMPARISON:  12/25/2017 chest x-Celia.  Abdominal CT 12/19/2017. FINDINGS: Artifact from EKG leads. A tracheostomy tube is present. There is haziness of the bilateral chest attributed to layering pleural fluid and atelectasis. No apical [employer]. There is a right upper extremity PICC with tortuous course, suspect folding in the left brachiocephalic vein. The tortuosity is above the azygos shadow. Stable heart size These results will be called to the ordering clinician or representative by the Radiologist Assistant, and communication documented in the PACS or zVision Dashboard. IMPRESSION: 1. Hazy bilateral opacity from atelectasis and layering effusions based on CT 12/19/2017. 2. Malpositioning of right upper extremity PICC which likely cannulates the distal left brachiocephalic vein. The catheter is kinked. Electronically Signed   By: Monte Fantasia M.D.   On: 12/10/2017 16:00   Dg Abd Portable 1v  Result Date: 12/13/2017 CLINICAL DATA:  Evaluate positioning of the feeding gastrostomy tube. EXAM: PORTABLE ABDOMEN - 1 VIEW COMPARISON:  CT abdomen and pelvis 12/19/2017. Abdominal x-Nery 12/19/2017 and earlier. FINDINGS: Contrast injection into the gastrostomy tube confirms appropriate positioning in the proximal gastric body. There is no evidence of contrast extravasation. Bowel gas pattern unremarkable.  IMPRESSION: 1. Appropriately positioned gastrostomy tube with no evidence of leak. 2. No acute abdominal abnormality. Electronically Signed   By: Evangeline Dakin M.D.   On: 12/01/2017 16:06    Assessment/Plan Principal Problem:   Acute on chronic respiratory failure with hypoxia (HCC) Active Problems:   Altered mental status   NSTEMI (non-ST elevated myocardial infarction) ([place])   Cardiac arrest (HCC)   DVT of upper extremity (deep vein thrombosis) ([place])   1. Acute on chronic respiratory failure with hypoxia patient's mechanics are poor right now is requirements of oxygen and high and is therefore not ready for any kind of weaning attempts at this time.  We will continue with supportive care on the ventilator. 2. Non-STEMI continue with present management patient has status post 2 stents. 3. Acute systolic heart failure last ejection fraction 30% prognosis guarded. 4. DVT treated but not on any anticoagulation at this time. 5. Status post cardiac arrest patient has suffered cardiac arrest at the other facility.  Family is realistic he is DNR.  They may consider comfort care if there is no improvement over the course of this week   I have personally seen and evaluated the patient, evaluated laboratory and imaging results, formulated the assessment and plan and placed orders. The Patient requires high complexity decision making for assessment and support.  Case was discussed on Rounds with the Respiratory Therapy Staff time spent 35 minutes counseling of the family discussion on rounds with medical staff on multidisciplinary team  Allyne Gee, MD [facility] Pulmonary Critical Care Medicine Sleep Medicine

## 2017-12-31 ENCOUNTER — Other Ambulatory Visit (HOSPITAL_COMMUNITY): Payer: PPO

## 2017-12-31 DIAGNOSIS — R0602 Shortness of breath: Secondary | ICD-10-CM | POA: Diagnosis not present

## 2017-12-31 LAB — RENAL FUNCTION PANEL
ANION GAP: 7 (ref 5–15)
Albumin: 2.1 g/dL — ABNORMAL LOW (ref 3.5–5.0)
BUN: 20 mg/dL (ref 8–23)
CALCIUM: 7.8 mg/dL — AB (ref 8.9–10.3)
CO2: 31 mmol/L (ref 22–32)
Chloride: 105 mmol/L (ref 98–111)
Creatinine, Ser: 1.07 mg/dL (ref 0.61–1.24)
GFR calc non Af Amer: >60 mL/min (ref >60)
GLUCOSE: 205 mg/dL — AB (ref 70–99)
PHOSPHORUS: 3.5 mg/dL (ref 2.5–4.6)
POTASSIUM: 3.5 mmol/L (ref 3.5–5.1)
Sodium: 143 mmol/L (ref 135–145)

## 2017-12-31 LAB — CBC
HEMATOCRIT: 32.8 % — AB (ref 39.0–52.0)
Hemoglobin: 10.2 g/dL — ABNORMAL LOW (ref 13.0–17.0)
MCH: 30.5 pg (ref 26.0–34.0)
MCHC: 31.1 g/dL (ref 30.0–36.0)
MCV: 98.2 fL (ref 78.0–100.0)
PLATELETS: 205 10*3/uL (ref 150–400)
RBC: 3.34 MIL/uL — ABNORMAL LOW (ref 4.22–5.81)
RDW: 15.4 % (ref 11.5–15.5)
WBC: 10.5 10*3/uL (ref 4.0–10.5)

## 2017-12-31 LAB — OCCULT BLOOD X 1 CARD TO LAB, STOOL: Fecal Occult Bld: NEGATIVE

## 2017-12-31 LAB — MAGNESIUM: Magnesium: 1.8 mg/dL (ref 1.7–2.4)

## 2017-12-31 LAB — CULTURE, RESPIRATORY W GRAM STAIN: Culture: NO GROWTH

## 2017-12-31 LAB — CULTURE, RESPIRATORY

## 2017-12-31 NOTE — Progress Notes (Signed)
Pulmonary Critical Care Medicine [place]   PULMONARY CRITICAL CARE SERVICE  PROGRESS NOTE  Date of Service: 12/31/2017  RAYNOLD BLANKENBAKER  OIN:[id number]  DOB: 06/03/1940   DOA: 11/28/2017  Referring Physician: Merton Border, MD  HPI: Marc [name] is a 77 y.o. male seen for follow up of Acute on Chronic Respiratory Failure.  Looks comfortable without distress was still on 50% oxygen remains in assist control mode currently the PEEP level is at 5  Medications: Reviewed on Rounds  Physical Exam:  Vitals: Temperature 97.3 pulse 107 respiratory rate 22 blood pressure 167/87 saturations 98%  Ventilator Settings mode of ventilation assist control FiO2 50% tidal volume 535 PEEP 5  . General: Comfortable at this time . Eyes: Grossly normal lids, irises & conjunctiva . ENT: grossly tongue is normal . Neck: no obvious mass . Cardiovascular: S1 S2 normal no gallop . Respiratory: Few rhonchi no rales . Abdomen: soft . Skin: no rash seen on limited exam . Musculoskeletal: not rigid . Psychiatric:unable to assess . Neurologic: no seizure no involuntary movements         Lab Data:   Basic Metabolic Panel: Recent Labs  Lab 12/27/17 0517 12/18/2017 0626 12/29/17 0720 12/30/17 0502 12/31/17 0552  NA 149* 151* 150* 145 143  K 3.5 3.4* 3.6 3.0* 3.5  CL 111 112* 112* 109 105  CO2 34* 34* 31 28 31   GLUCOSE 201* 87 207* 212* 205*  BUN 47* 41* 34* 26* 20  CREATININE 1.02 0.89 1.17 1.05 1.07  CALCIUM 8.6* 8.4* 8.2* 8.1* 7.8*  MG  --  1.9 2.0  --  1.8  PHOS  --  4.2 3.3  --  3.5    ABG: Recent Labs  Lab 12/25/17 0215 12/27/17 0452 12/29/17 1242  PHART 7.45 7.46* 7.448  PCO2ART 50* 46 45.2  PO2ART 66* 63* 58.7*  HCO3 34.8* 32.7* 30.8*  O2SAT 93.6 93.0 90.4    Liver Function Tests: Recent Labs  Lab 12/25/17 0019 12/27/2017 0626 12/29/17 0720 12/31/17 0552  AST 25 23 22   --   ALT 27 24 25   --   ALKPHOS 153* 137* 146*  --   BILITOT 0.8 0.6 0.8  --   PROT  5.4* 5.1* 4.7*  --   ALBUMIN 2.9* 2.3* 2.2* 2.1*   No results for input(s): LIPASE, AMYLASE in the last 168 hours. No results for input(s): AMMONIA in the last 168 hours.  CBC: Recent Labs  Lab 12/24/17 2231 12/25/17 0019 12/25/17 0436  12/26/17 1358 12/27/17 0517 12/01/2017 0626 12/29/17 1256 12/31/17 0552  WBC 8.4 9.3 14.3*  --   --  6.4 5.7 7.1 10.5  NEUTROABS 6.6* 7.0* 12.6*  --   --  4.8  --  5.6  --   HGB 9.1* 8.7* 11.7*   < > 10.3* 10.1* 9.9* 10.2* 10.2*  HCT 27.0* 26.0* 34.5*   < > 30.4* 30.6* 29.6* 33.5* 32.8*  MCV 98.2 97.6 93.9  --   --  94.3 95.6 101.5* 98.2  PLT 235 232 232  --   --  208 196 186 205   < > = values in this interval not displayed.    Cardiac Enzymes: Recent Labs  Lab 12/25/17 0436 12/25/17 0811 12/25/17 1650 12/26/17 1639  CKTOTAL  --   --   --  16*  TROPONINI 1.61* 1.36* 1.12*  --     BNP (last 3 results) No results for input(s): BNP in the last 8760 hours.  ProBNP (last 3 results) No results for input(s): PROBNP in the last 8760 hours.  Radiological Exams: Dg Chest Port 1 View  Result Date: 12/31/2017 CLINICAL DATA:  Shortness of breath. EXAM: PORTABLE CHEST 1 VIEW COMPARISON:  12/29/2017. FINDINGS: Interim removal of right PICC line. Tracheostomy tube and left PICC line stable position. Heart size stable. Progressive diffuse bilateral pulmonary infiltrates/edema. Progressive bilateral pleural effusions. IMPRESSION: 1. Interim removal of right PICC line. Tracheostomy tube and left PICC line stable position. 2. Progressive diffuse bilateral pulmonary infiltrates/edema. Progressive bilateral pleural effusions. Electronically Signed   By: Marcello Moores  Register   On: 12/31/2017 12:49   Dg Chest Port 1 View  Result Date: 12/29/2017 CLINICAL DATA:  Patient status post PICC line placement. EXAM: PORTABLE CHEST 1 VIEW COMPARISON:  Chest radiograph 12/29/2017 FINDINGS: Interval insertion left upper extremity PICC line with tip projecting over the right  atrium. Interval retraction right upper extremity PICC line with tip projecting at the central right brachiocephalic vein. Tracheostomy tube mid trachea. Monitoring leads overlie the patient. Stable cardiac and mediastinal contours. Layering bilateral pleural effusions with underlying pulmonary consolidation. IMPRESSION: New left upper extremity PICC line tip projects over the right atrium. Interval retraction right upper extremity PICC line with tip projecting in the central right brachiocephalic vein. Layering large bilateral effusions and underlying opacities. Electronically Signed   By: Lovey Newcomer M.D.   On: 12/29/2017 19:16   Dg Chest Port 1 View  Result Date: 12/29/2017 CLINICAL DATA:  Bedside repositioning of previously placed RIGHT UPPER extremity PICC. EXAM: PORTABLE CHEST 1 VIEW 3:55 p.m.: COMPARISON:  Portable chest x-Pau earlier same day at 3:46 p.m. and previously. FINDINGS: RIGHT arm PICC unchanged in position, again looped in the midline with its tip directed laterally, likely in the LEFT innominate vein. Please see the report of the examination 9 minutes ago at 3:46 p.m. for comments on the remainder of the chest x-Noemi. IMPRESSION: RIGHT arm PICC tip unchanged in position, likely in the LEFT innominate vein. In order to have the tip of the PICC in the UPPER SVC or LEFT innominate vein, I recommend withdrawing the catheter approximately 5 cm. These results will be called to the ordering clinician or representative by the Radiologist Assistant, and communication documented in the PACS or zVision Dashboard. Electronically Signed   By: Evangeline Dakin M.D.   On: 12/29/2017 16:42   Dg Chest Port 1 View  Result Date: 12/29/2017 CLINICAL DATA:  Bedside repositioning of previously placed RIGHT UPPER extremity PICC. EXAM: PORTABLE CHEST 1 VIEW 3:46 p.m.: COMPARISON:  Portable chest x-rays yesterday, 12/25/2017 and earlier. FINDINGS: The RIGHT arm PICC is unchanged in position, looped in the midline  with its tip directed laterally, likely in the LEFT innominate vein. Tracheostomy tube in satisfactory position below the thoracic inlet, unchanged. Airspace consolidation in the perihilar regions of both UPPER lobes and dense consolidation in the BILATERAL lower lobes, unchanged, associated with moderate-sized BILATERAL pleural effusions. No new pulmonary parenchymal abnormalities. IMPRESSION: 1. RIGHT arm PICC unchanged in position, looped in the midline with its tip directed laterally, likely in the LEFT innominate vein. 2. Stable multilobar BILATERAL pneumonia and moderate-sized BILATERAL pleural effusions. Electronically Signed   By: Evangeline Dakin M.D.   On: 12/29/2017 16:32    Assessment/Plan Principal Problem:   Acute on chronic respiratory failure with hypoxia (HCC) Active Problems:   Altered mental status   NSTEMI (non-ST elevated myocardial infarction) [facility])   Cardiac arrest (HCC)   DVT of upper extremity (deep vein  thrombosis) ([place])   Acute systolic heart failure ([place])   1. Acute on chronic respiratory failure with hypoxia we will continue with full support on assist control mode check the spontaneous breathing index to see if patient is able to do any weaning.  Last chest x-Yadir still showed multi lobar pneumonia versus fluid.  Patient does have bilateral effusions noted also.  Consider a follow-up CT of the chest to reassess if there is significant fluid the patient may benefit from thoracentesis which could actually help him to wean. 2. Non-STEMI at baseline we will continue with present management no arrhythmias noted. 3. Cardiac arrest rhythm is stable at this time. 4. Altered mental status he is a little bit more awake today. 5. Acute systolic heart failure as mentioned continue with fluid management consider a follow-up CT of the chest to get a better evaluation of the lung parenchyma fluid versus effusion versus pneumonia   I have personally seen and evaluated the patient,  evaluated laboratory and imaging results, formulated the assessment and plan and placed orders. The Patient requires high complexity decision making for assessment and support.  Case was discussed on Rounds with the Respiratory Therapy Staff time spent 35 minutes patient is critically ill in danger of cardiac arrest continue with supportive care discussed case on multidisciplinary rounds with the management team as well as reviewed radiological studies  Allyne Gee, MD [facility] Pulmonary Critical Care Medicine Sleep Medicine

## 2018-01-01 DIAGNOSIS — J90 Pleural effusion, not elsewhere classified: Secondary | ICD-10-CM | POA: Diagnosis not present

## 2018-01-01 LAB — OCCULT BLOOD X 1 CARD TO LAB, STOOL: Fecal Occult Bld: NEGATIVE

## 2018-01-01 NOTE — Progress Notes (Signed)
Pulmonary Critical Care Medicine [place]   PULMONARY CRITICAL CARE SERVICE  PROGRESS NOTE  Date of Service: 01/01/2018  Marc [name]  [id number]  DOB: [birth year]   DOA: 12/11/2017  Referring Physician: Merton Border, MD  HPI: Marc [name] is a 77 y.o. male seen for follow up of Acute on Chronic Respiratory Failure.  Patient right now is on full vent support was attempted on pressure support and did not tolerate it.  Patient's oxygen requirements have also gone up to 60%.  Family was present in the room we had a lengthy discussion conversation regarding his prognosis.  And there is a family meeting planned for tomorrow I will try to take part in that meeting  Medications: Reviewed on Rounds  Physical Exam:  Vitals: Temperature 98.3 pulse 101 respiratory rate 22 blood pressure 153/92 saturations 91%  Ventilator Settings mode of ventilation assist control FiO2 60% tidal volume 535 PEEP 5  . General: Comfortable at this time . Eyes: Grossly normal lids, irises & conjunctiva . ENT: grossly tongue is normal . Neck: no obvious mass . Cardiovascular: S1 S2 normal no gallop . Respiratory: Coarse rhonchi are noted bilaterally . Abdomen: soft . Skin: no rash seen on limited exam . Musculoskeletal: not rigid . Psychiatric:unable to assess . Neurologic: no seizure no involuntary movements         Lab Data:   Basic Metabolic Panel: Recent Labs  Lab 12/27/17 0517 12/08/2017 0626 12/29/17 0720 12/30/17 0502 12/31/17 0552  NA 149* 151* 150* 145 143  K 3.5 3.4* 3.6 3.0* 3.5  CL 111 112* 112* 109 105  CO2 34* 34* 31 28 31   GLUCOSE 201* 87 207* 212* 205*  BUN 47* 41* 34* 26* 20  CREATININE 1.02 0.89 1.17 1.05 1.07  CALCIUM 8.6* 8.4* 8.2* 8.1* 7.8*  MG  --  1.9 2.0  --  1.8  PHOS  --  4.2 3.3  --  3.5    ABG: Recent Labs  Lab 12/27/17 0452 12/29/17 1242  PHART 7.46* 7.448  PCO2ART 46 45.2  PO2ART 63* 58.7*  HCO3 32.7* 30.8*  O2SAT 93.0 90.4     Liver Function Tests: Recent Labs  Lab 12/02/2017 0626 12/29/17 0720 12/31/17 0552  AST 23 22  --   ALT 24 25  --   ALKPHOS 137* 146*  --   BILITOT 0.6 0.8  --   PROT 5.1* 4.7*  --   ALBUMIN 2.3* 2.2* 2.1*   No results for input(s): LIPASE, AMYLASE in the last 168 hours. No results for input(s): AMMONIA in the last 168 hours.  CBC: Recent Labs  Lab 12/26/17 1358 12/27/17 0517 12/01/2017 0626 12/29/17 1256 12/31/17 0552  WBC  --  6.4 5.7 7.1 10.5  NEUTROABS  --  4.8  --  5.6  --   HGB 10.3* 10.1* 9.9* 10.2* 10.2*  HCT 30.4* 30.6* 29.6* 33.5* 32.8*  MCV  --  94.3 95.6 101.5* 98.2  PLT  --  208 196 186 205    Cardiac Enzymes: Recent Labs  Lab 12/25/17 1650 12/26/17 1639  CKTOTAL  --  16*  TROPONINI 1.12*  --     BNP (last 3 results) No results for input(s): BNP in the last 8760 hours.  ProBNP (last 3 results) No results for input(s): PROBNP in the last 8760 hours.  Radiological Exams: Ct Chest Wo Contrast  Result Date: 01/01/2018 CLINICAL DATA:  Hypoxia EXAM: CT CHEST WITHOUT CONTRAST TECHNIQUE: Multidetector CT imaging of the  chest was performed following the standard protocol without IV contrast. COMPARISON:  CXR 12/31/2017, chest CT 11/30/2017 FINDINGS: Cardiovascular: Top-normal size heart without pericardial effusion. Nonaneurysmal minimally and atherosclerotic aorta. No dilatation of the main pulmonary artery. Left-sided PICC line tip terminates in the distal SVC. Two vessel coronary arteriosclerosis along the LAD and RCA. Mediastinum/Nodes: Tracheostomy tube is centered within the upper trachea. Mildly enlarged mediastinal lymph nodes likely reactive measuring up to 9 mm short axis. The hila are limited due to adjacent pulmonary consolidations and lack of IV contrast. Lungs/Pleura: Moderate to large bilateral pleural effusions with compressive atelectasis. Diffuse patchy airspace opacities within the visualized aerated lungs bilaterally compatible with stigmata  of CHF, multilobar pneumonia or combination of both. No pneumothorax. Upper Abdomen: Unremarkable. Musculoskeletal: Diffuse soft tissue anasarca. IMPRESSION: Diffuse soft tissue anasarca with interval development of moderate to large bilateral pleural effusions, compressive atelectasis and patchy alveolar opacities more likely related to CHF/pulmonary edema. Superimposed multilobar pneumonia of the aerated lungs bilaterally is not entirely excluded Coronary arteriosclerosis and minimal aortic atherosclerosis. Support lines and tubes as above. Aortic Atherosclerosis (ICD10-I70.0). Electronically Signed   By: Ashley Royalty M.D.   On: 01/01/2018 03:05   Dg Chest Port 1 View  Result Date: 12/31/2017 CLINICAL DATA:  Shortness of breath. EXAM: PORTABLE CHEST 1 VIEW COMPARISON:  12/29/2017. FINDINGS: Interim removal of right PICC line. Tracheostomy tube and left PICC line stable position. Heart size stable. Progressive diffuse bilateral pulmonary infiltrates/edema. Progressive bilateral pleural effusions. IMPRESSION: 1. Interim removal of right PICC line. Tracheostomy tube and left PICC line stable position. 2. Progressive diffuse bilateral pulmonary infiltrates/edema. Progressive bilateral pleural effusions. Electronically Signed   By: Marcello Moores  Register   On: 12/31/2017 12:49    Assessment/Plan Principal Problem:   Acute on chronic respiratory failure with hypoxia (HCC) Active Problems:   Altered mental status   NSTEMI (non-ST elevated myocardial infarction) ([place])   Cardiac arrest ([place])   DVT of upper extremity (deep vein thrombosis) (HCC)   Acute systolic heart failure ([place])   1. Acute on chronic respiratory failure with hypoxia patient continues with failure to wean.  CT shows gross fluid overload in the lungs.  Also has significant bilateral pleural effusions.  Likely reason for failure to wean is the combination of effusions as well as congestive heart failure. 2. Acute systolic heart failure overall  prognosis was poor. 3. DVT not able to treat because of bleeding issues. 4. Non-STEMI status post cardiac cath and stent placement prognosis guarded 5. Altered mental status grossly unchanged   I have personally seen and evaluated the patient, evaluated laboratory and imaging results, formulated the assessment and plan and placed orders. The Patient requires high complexity decision making for assessment and support.  Case was discussed on Rounds with the Respiratory Therapy Staff time spent 35 minutes discussion with the family regarding his radiological findings and overall prognosis.  Allyne Gee, MD [facility] Pulmonary Critical Care Medicine Sleep Medicine

## 2018-01-02 ENCOUNTER — Other Ambulatory Visit (HOSPITAL_COMMUNITY): Payer: PPO

## 2018-01-02 DIAGNOSIS — J90 Pleural effusion, not elsewhere classified: Secondary | ICD-10-CM | POA: Diagnosis not present

## 2018-01-02 LAB — RENAL FUNCTION PANEL
ANION GAP: 13 (ref 5–15)
Albumin: 2 g/dL — ABNORMAL LOW (ref 3.5–5.0)
BUN: 24 mg/dL — ABNORMAL HIGH (ref 8–23)
CHLORIDE: 101 mmol/L (ref 98–111)
CO2: 29 mmol/L (ref 22–32)
CREATININE: 1.14 mg/dL (ref 0.61–1.24)
Calcium: 8 mg/dL — ABNORMAL LOW (ref 8.9–10.3)
Glucose, Bld: 88 mg/dL (ref 70–99)
Phosphorus: 3.8 mg/dL (ref 2.5–4.6)
Potassium: 3.3 mmol/L — ABNORMAL LOW (ref 3.5–5.1)
Sodium: 143 mmol/L (ref 135–145)

## 2018-01-02 LAB — CBC
HEMATOCRIT: 31.6 % — AB (ref 39.0–52.0)
Hemoglobin: 9.7 g/dL — ABNORMAL LOW (ref 13.0–17.0)
MCH: 30.1 pg (ref 26.0–34.0)
MCHC: 30.7 g/dL (ref 30.0–36.0)
MCV: 98.1 fL (ref 78.0–100.0)
Platelets: 187 10*3/uL (ref 150–400)
RBC: 3.22 MIL/uL — ABNORMAL LOW (ref 4.22–5.81)
RDW: 15.7 % — AB (ref 11.5–15.5)
WBC: 11.4 10*3/uL — ABNORMAL HIGH (ref 4.0–10.5)

## 2018-01-02 LAB — MAGNESIUM: Magnesium: 1.8 mg/dL (ref 1.7–2.4)

## 2018-01-02 LAB — LACTIC ACID, PLASMA: Lactic Acid, Venous: 1.4 mmol/L (ref 0.5–1.9)

## 2018-01-02 MED ORDER — LIDOCAINE HCL (PF) 1 % IJ SOLN
INTRAMUSCULAR | Status: AC
  Filled 2018-01-02: qty 30

## 2018-01-02 NOTE — Progress Notes (Addendum)
Pulmonary Critical Care Medicine [place]   PULMONARY CRITICAL CARE SERVICE  PROGRESS NOTE  Date of Service: 01/02/2018  Marc [name]  [id number]  DOB: 11-23-1940   DOA: 12/10/2017  Referring Physician: Merton Border, MD  HPI: Marc [name] is a 77 y.o. male seen for follow up of Acute on Chronic Respiratory Failure.  [place] is on the full vent support.  Had a thoracentesis done removal of about 700 cc of dark fluid currently on a PEEP of 5  Medications: Reviewed on Rounds  Physical Exam:  Vitals: Temperature 97.0 pulse 92 respiratory rate 18 blood pressure 122/78 saturations 100%  Ventilator Settings mode of ventilation assist control FiO2 60% tidal volume 551 PEEP 5  . General: Comfortable at this time . Eyes: Grossly normal lids, irises & conjunctiva . ENT: grossly tongue is normal . Neck: no obvious mass . Cardiovascular: S1 S2 normal no gallop . Respiratory: Coarse rhonchi are noted bilaterally . Abdomen: soft . Skin: no rash seen on limited exam . Musculoskeletal: not rigid . Psychiatric:unable to assess . Neurologic: no seizure no involuntary movements         Lab Data:   Basic Metabolic Panel: Recent Labs  Lab 12/26/2017 0626 12/29/17 0720 12/30/17 0502 12/31/17 0552 01/02/18 0544  NA 151* 150* 145 143 143  K 3.4* 3.6 3.0* 3.5 3.3*  CL 112* 112* 109 105 101  CO2 34* 31 28 31 29   GLUCOSE 87 207* 212* 205* 88  BUN 41* 34* 26* 20 24*  CREATININE 0.89 1.17 1.05 1.07 1.14  CALCIUM 8.4* 8.2* 8.1* 7.8* 8.0*  MG 1.9 2.0  --  1.8 1.8  PHOS 4.2 3.3  --  3.5 3.8    ABG: Recent Labs  Lab 12/27/17 0452 12/29/17 1242  PHART 7.46* 7.448  PCO2ART 46 45.2  PO2ART 63* 58.7*  HCO3 32.7* 30.8*  O2SAT 93.0 90.4    Liver Function Tests: Recent Labs  Lab 12/10/2017 0626 12/29/17 0720 12/31/17 0552 01/02/18 0544  AST 23 22  --   --   ALT 24 25  --   --   ALKPHOS 137* 146*  --   --   BILITOT 0.6 0.8  --   --   PROT 5.1* 4.7*  --   --    ALBUMIN 2.3* 2.2* 2.1* 2.0*   No results for input(s): LIPASE, AMYLASE in the last 168 hours. No results for input(s): AMMONIA in the last 168 hours.  CBC: Recent Labs  Lab 12/27/17 0517 12/01/2017 0626 12/29/17 1256 12/31/17 0552 01/02/18 0544  WBC 6.4 5.7 7.1 10.5 11.4*  NEUTROABS 4.8  --  5.6  --   --   HGB 10.1* 9.9* 10.2* 10.2* 9.7*  HCT 30.6* 29.6* 33.5* 32.8* 31.6*  MCV 94.3 95.6 101.5* 98.2 98.1  PLT 208 196 186 205 187    Cardiac Enzymes: Recent Labs  Lab 12/26/17 1639  CKTOTAL 16*    BNP (last 3 results) No results for input(s): BNP in the last 8760 hours.  ProBNP (last 3 results) No results for input(s): PROBNP in the last 8760 hours.  Radiological Exams: Ct Chest Wo Contrast  Result Date: 01/01/2018 CLINICAL DATA:  Hypoxia EXAM: CT CHEST WITHOUT CONTRAST TECHNIQUE: Multidetector CT imaging of the chest was performed following the standard protocol without IV contrast. COMPARISON:  CXR 12/31/2017, chest CT 11/30/2017 FINDINGS: Cardiovascular: Top-normal size heart without pericardial effusion. Nonaneurysmal minimally and atherosclerotic aorta. No dilatation of the main pulmonary artery. Left-sided PICC line  tip terminates in the distal SVC. Two vessel coronary arteriosclerosis along the LAD and RCA. Mediastinum/Nodes: Tracheostomy tube is centered within the upper trachea. Mildly enlarged mediastinal lymph nodes likely reactive measuring up to 9 mm short axis. The hila are limited due to adjacent pulmonary consolidations and lack of IV contrast. Lungs/Pleura: Moderate to large bilateral pleural effusions with compressive atelectasis. Diffuse patchy airspace opacities within the visualized aerated lungs bilaterally compatible with stigmata of CHF, multilobar pneumonia or combination of both. No pneumothorax. Upper Abdomen: Unremarkable. Musculoskeletal: Diffuse soft tissue anasarca. IMPRESSION: Diffuse soft tissue anasarca with interval development of moderate to large  bilateral pleural effusions, compressive atelectasis and patchy alveolar opacities more likely related to CHF/pulmonary edema. Superimposed multilobar pneumonia of the aerated lungs bilaterally is not entirely excluded Coronary arteriosclerosis and minimal aortic atherosclerosis. Support lines and tubes as above. Aortic Atherosclerosis (ICD10-I70.0). Electronically Signed   By: Ashley Royalty M.D.   On: 01/01/2018 03:05   Dg Chest Port 1 View  Result Date: 01/02/2018 CLINICAL DATA:  Status post left thoracentesis today. EXAM: PORTABLE CHEST 1 VIEW COMPARISON:  Single-view of the chest and CT chest 12/31/2017. FINDINGS: No pneumothorax is seen after thoracentesis. Left pleural effusion is decreased. Right pleural effusion appears increased. Diffuse bilateral airspace disease is unchanged. Heart size is normal. Support apparatus is stable in position. IMPRESSION: Decreased left effusion after thoracentesis. Negative for pneumothorax. No notable change in extensive bilateral airspace disease. Right pleural effusion seen on the prior examinations appears slightly increased. Electronically Signed   By: Inge Rise M.D.   On: 01/02/2018 09:51   Dg Chest Port 1 View  Result Date: 12/31/2017 CLINICAL DATA:  Shortness of breath. EXAM: PORTABLE CHEST 1 VIEW COMPARISON:  12/29/2017. FINDINGS: Interim removal of right PICC line. Tracheostomy tube and left PICC line stable position. Heart size stable. Progressive diffuse bilateral pulmonary infiltrates/edema. Progressive bilateral pleural effusions. IMPRESSION: 1. Interim removal of right PICC line. Tracheostomy tube and left PICC line stable position. 2. Progressive diffuse bilateral pulmonary infiltrates/edema. Progressive bilateral pleural effusions. Electronically Signed   By: Marcello Moores  Register   On: 12/31/2017 12:49   US Thoracentesis Asp Pleural Space W/img Guide  Result Date: 01/02/2018 INDICATION: Patient is status post PEA arrest, now with vent dependent  respiratory failure and MRSA pneumonia. Found to have bilateral pleural effusions. Request is made for therapeutic thoracentesis. EXAM: ULTRASOUND GUIDED THERAPEUTIC LEFT THORACENTESIS MEDICATIONS: 10 mL 1% lidocaine COMPLICATIONS: None immediate. PROCEDURE: An ultrasound guided thoracentesis was thoroughly discussed with the patient and questions answered. The benefits, risks, alternatives and complications were also discussed. The patient understands and wishes to proceed with the procedure. Written consent was obtained. Ultrasound was performed to localize and mark an adequate pocket of fluid in the left chest. The area was then prepped and draped in the normal sterile fashion. 1% Lidocaine was used for local anesthesia. Under ultrasound guidance a 6 Fr Safe-T-Centesis catheter was introduced. Thoracentesis was performed. The catheter was removed and a dressing applied. FINDINGS: A total of approximately 700 mL of yellow fluid was removed. IMPRESSION: Successful ultrasound guided therapeutic left thoracentesis yielding 700 mL of pleural fluid. Read by: Brynda Greathouse PA-C Electronically Signed   By: Markus Daft M.D.   On: 01/02/2018 09:43    Assessment/Plan Principal Problem:   Acute on chronic respiratory failure with hypoxia (HCC) Active Problems:   Altered mental status   NSTEMI (non-ST elevated myocardial infarction) ([place])   Cardiac arrest (HCC)   DVT of upper extremity (deep vein thrombosis) (  [place])   Acute systolic heart failure ([place])   1. Acute on chronic respiratory failure with hypoxia we will continue with full support on assist control mode hopefully tomorrow we will have the other side of the chest tapped for fluid if this does help then will try to start weaning. 2. Non-STEMI at baseline continue with supportive care 3. Status post cardiac arrest no arrhythmia noted at this time 4. DVT not able to anticoagulate 5. Acute systolic heart failure continue with present  management 6. Altered mental status at baseline nonverbal  She remains critically ill and in danger of cardiac arrest time spent 35 minutes review of the medical record discussion with the respiratory staff as well as with the family at the bedside   I have personally seen and evaluated the patient, evaluated laboratory and imaging results, formulated the assessment and plan and placed orders. The Patient requires high complexity decision making for assessment and support.  Case was discussed on Rounds with the Respiratory Therapy Staff  Allyne Gee, MD [facility] Pulmonary Critical Care Medicine Sleep Medicine

## 2018-01-02 NOTE — Procedures (Addendum)
PROCEDURE SUMMARY:  Successful US guided left therapeutic thoracentesis. Yielded 700 mL of yellow fluid. Pt tolerated procedure well. No immediate complications.  Specimen was not sent for labs. CXR ordered.  Docia Barrier PA-C 01/02/2018 9:03 AM

## 2018-01-03 LAB — RENAL FUNCTION PANEL
ALBUMIN: 1.9 g/dL — AB (ref 3.5–5.0)
ANION GAP: 10 (ref 5–15)
BUN: 26 mg/dL — ABNORMAL HIGH (ref 8–23)
CALCIUM: 7.9 mg/dL — AB (ref 8.9–10.3)
CO2: 31 mmol/L (ref 22–32)
Chloride: 102 mmol/L (ref 98–111)
Creatinine, Ser: 1.11 mg/dL (ref 0.61–1.24)
Glucose, Bld: 115 mg/dL — ABNORMAL HIGH (ref 70–99)
PHOSPHORUS: 4 mg/dL (ref 2.5–4.6)
Potassium: 3.2 mmol/L — ABNORMAL LOW (ref 3.5–5.1)
SODIUM: 143 mmol/L (ref 135–145)

## 2018-01-03 LAB — CBC
HEMATOCRIT: 30.1 % — AB (ref 39.0–52.0)
HEMOGLOBIN: 9.2 g/dL — AB (ref 13.0–17.0)
MCH: 30.3 pg (ref 26.0–34.0)
MCHC: 30.6 g/dL (ref 30.0–36.0)
MCV: 99 fL (ref 78.0–100.0)
Platelets: 203 10*3/uL (ref 150–400)
RBC: 3.04 MIL/uL — ABNORMAL LOW (ref 4.22–5.81)
RDW: 15.6 % — ABNORMAL HIGH (ref 11.5–15.5)
WBC: 10.6 10*3/uL — AB (ref 4.0–10.5)

## 2018-01-03 LAB — MAGNESIUM: Magnesium: 1.8 mg/dL (ref 1.7–2.4)

## 2018-01-09 ENCOUNTER — Encounter: Payer: Self-pay | Admitting: Internal Medicine

## 2018-01-28 NOTE — Progress Notes (Signed)
Pulmonary Critical Care Medicine [place]   PULMONARY CRITICAL CARE SERVICE  PROGRESS NOTE  Date of Service: 21-Jan-2018  Marc [name]  BZJ:[id number]  DOB: 06/04/1940   DOA: 12/27/2017  Referring Physician: Merton Border, MD  HPI: Marc [name] is a 77 y.o. male seen for follow up of Acute on Chronic Respiratory Failure.  Patient's family is at the bedside he is comfort care DNR at this time.  Medications: Reviewed on Rounds  Physical Exam:  Vitals: Temperature 97.2 pulse 93 respiratory 26 blood pressure 120/58 saturations 96%  Ventilator Settings on full support will be taken off the ventilator for terminal wean  . General: Comfortable at this time . Eyes: Grossly normal lids, irises & conjunctiva . ENT: grossly tongue is normal . Neck: no obvious mass . Cardiovascular: S1 S2 normal no gallop . Respiratory: No rhonchi or rales . Abdomen: soft . Skin: no rash seen on limited exam . Musculoskeletal: not rigid . Psychiatric:unable to assess . Neurologic: no seizure no involuntary movements         Lab Data:   Basic Metabolic Panel: Recent Labs  Lab 12/27/2017 0626 12/29/17 0720 12/30/17 0502 12/31/17 0552 01/02/18 0544 01/21/18 0846  NA 151* 150* 145 143 143 143  K 3.4* 3.6 3.0* 3.5 3.3* 3.2*  CL 112* 112* 109 105 101 102  CO2 34* 31 28 31 29 31   GLUCOSE 87 207* 212* 205* 88 115*  BUN 41* 34* 26* 20 24* 26*  CREATININE 0.89 1.17 1.05 1.07 1.14 1.11  CALCIUM 8.4* 8.2* 8.1* 7.8* 8.0* 7.9*  MG 1.9 2.0  --  1.8 1.8 1.8  PHOS 4.2 3.3  --  3.5 3.8 4.0    ABG: Recent Labs  Lab 12/29/17 1242  PHART 7.448  PCO2ART 45.2  PO2ART 58.7*  HCO3 30.8*  O2SAT 90.4    Liver Function Tests: Recent Labs  Lab 12/24/2017 0626 12/29/17 0720 12/31/17 0552 01/02/18 0544 21-Jan-2018 0846  AST 23 22  --   --   --   ALT 24 25  --   --   --   ALKPHOS 137* 146*  --   --   --   BILITOT 0.6 0.8  --   --   --   PROT 5.1* 4.7*  --   --   --   ALBUMIN 2.3* 2.2*  2.1* 2.0* 1.9*   No results for input(s): LIPASE, AMYLASE in the last 168 hours. No results for input(s): AMMONIA in the last 168 hours.  CBC: Recent Labs  Lab 12/10/2017 0626 12/29/17 1256 12/31/17 0552 01/02/18 0544 01/21/18 0846  WBC 5.7 7.1 10.5 11.4* 10.6*  NEUTROABS  --  5.6  --   --   --   HGB 9.9* 10.2* 10.2* 9.7* 9.2*  HCT 29.6* 33.5* 32.8* 31.6* 30.1*  MCV 95.6 101.5* 98.2 98.1 99.0  PLT 196 186 205 187 203    Cardiac Enzymes: No results for input(s): CKTOTAL, CKMB, CKMBINDEX, TROPONINI in the last 168 hours.  BNP (last 3 results) No results for input(s): BNP in the last 8760 hours.  ProBNP (last 3 results) No results for input(s): PROBNP in the last 8760 hours.  Radiological Exams: Dg Chest Port 1 View  Result Date: 01/02/2018 CLINICAL DATA:  Status post left thoracentesis today. EXAM: PORTABLE CHEST 1 VIEW COMPARISON:  Single-view of the chest and CT chest 12/31/2017. FINDINGS: No pneumothorax is seen after thoracentesis. Left pleural effusion is decreased. Right pleural effusion appears increased. Diffuse  bilateral airspace disease is unchanged. Heart size is normal. Support apparatus is stable in position. IMPRESSION: Decreased left effusion after thoracentesis. Negative for pneumothorax. No notable change in extensive bilateral airspace disease. Right pleural effusion seen on the prior examinations appears slightly increased. Electronically Signed   By: Inge Rise M.D.   On: 01/02/2018 09:51   US Thoracentesis Asp Pleural Space W/img Guide  Result Date: 01/02/2018 INDICATION: Patient is status post PEA arrest, now with vent dependent respiratory failure and MRSA pneumonia. Found to have bilateral pleural effusions. Request is made for therapeutic thoracentesis. EXAM: ULTRASOUND GUIDED THERAPEUTIC LEFT THORACENTESIS MEDICATIONS: 10 mL 1% lidocaine COMPLICATIONS: None immediate. PROCEDURE: An ultrasound guided thoracentesis was thoroughly discussed with the patient  and questions answered. The benefits, risks, alternatives and complications were also discussed. The patient understands and wishes to proceed with the procedure. Written consent was obtained. Ultrasound was performed to localize and mark an adequate pocket of fluid in the left chest. The area was then prepped and draped in the normal sterile fashion. 1% Lidocaine was used for local anesthesia. Under ultrasound guidance a 6 Fr Safe-T-Centesis catheter was introduced. Thoracentesis was performed. The catheter was removed and a dressing applied. FINDINGS: A total of approximately 700 mL of yellow fluid was removed. IMPRESSION: Successful ultrasound guided therapeutic left thoracentesis yielding 700 mL of pleural fluid. Read by: Brynda Greathouse PA-C Electronically Signed   By: Markus Daft M.D.   On: 01/02/2018 09:43    Assessment/Plan Principal Problem:   Acute on chronic respiratory failure with hypoxia (HCC) Active Problems:   Altered mental status   NSTEMI (non-ST elevated myocardial infarction) [facility])   Cardiac arrest ([place])   DVT of upper extremity (deep vein thrombosis) (HCC)   Acute systolic heart failure ([place])   1. Acute on chronic respiratory failure with hypoxia continue with the supportive care sedation medications were written and also medications for pain relief were written. 2. Altered mental status unchanged 3. Slight non-STEMI poor prognosis   I have personally seen and evaluated the patient, evaluated laboratory and imaging results, formulated the assessment and plan and placed orders. The Patient requires high complexity decision making for assessment and support.  Case was discussed on Rounds with the Respiratory Therapy Staff  Allyne Gee, MD [facility] Pulmonary Critical Care Medicine Sleep Medicine

## 2018-01-28 DEATH — deceased

## 2019-12-11 IMAGING — DX DG CHEST 1V
1 series · 1 of 1 positions shown · non-contrast
Comparison: 11/30/2017

CLINICAL DATA: o2 decrease

EXAM:
CHEST  1 VIEW

[chest ap]
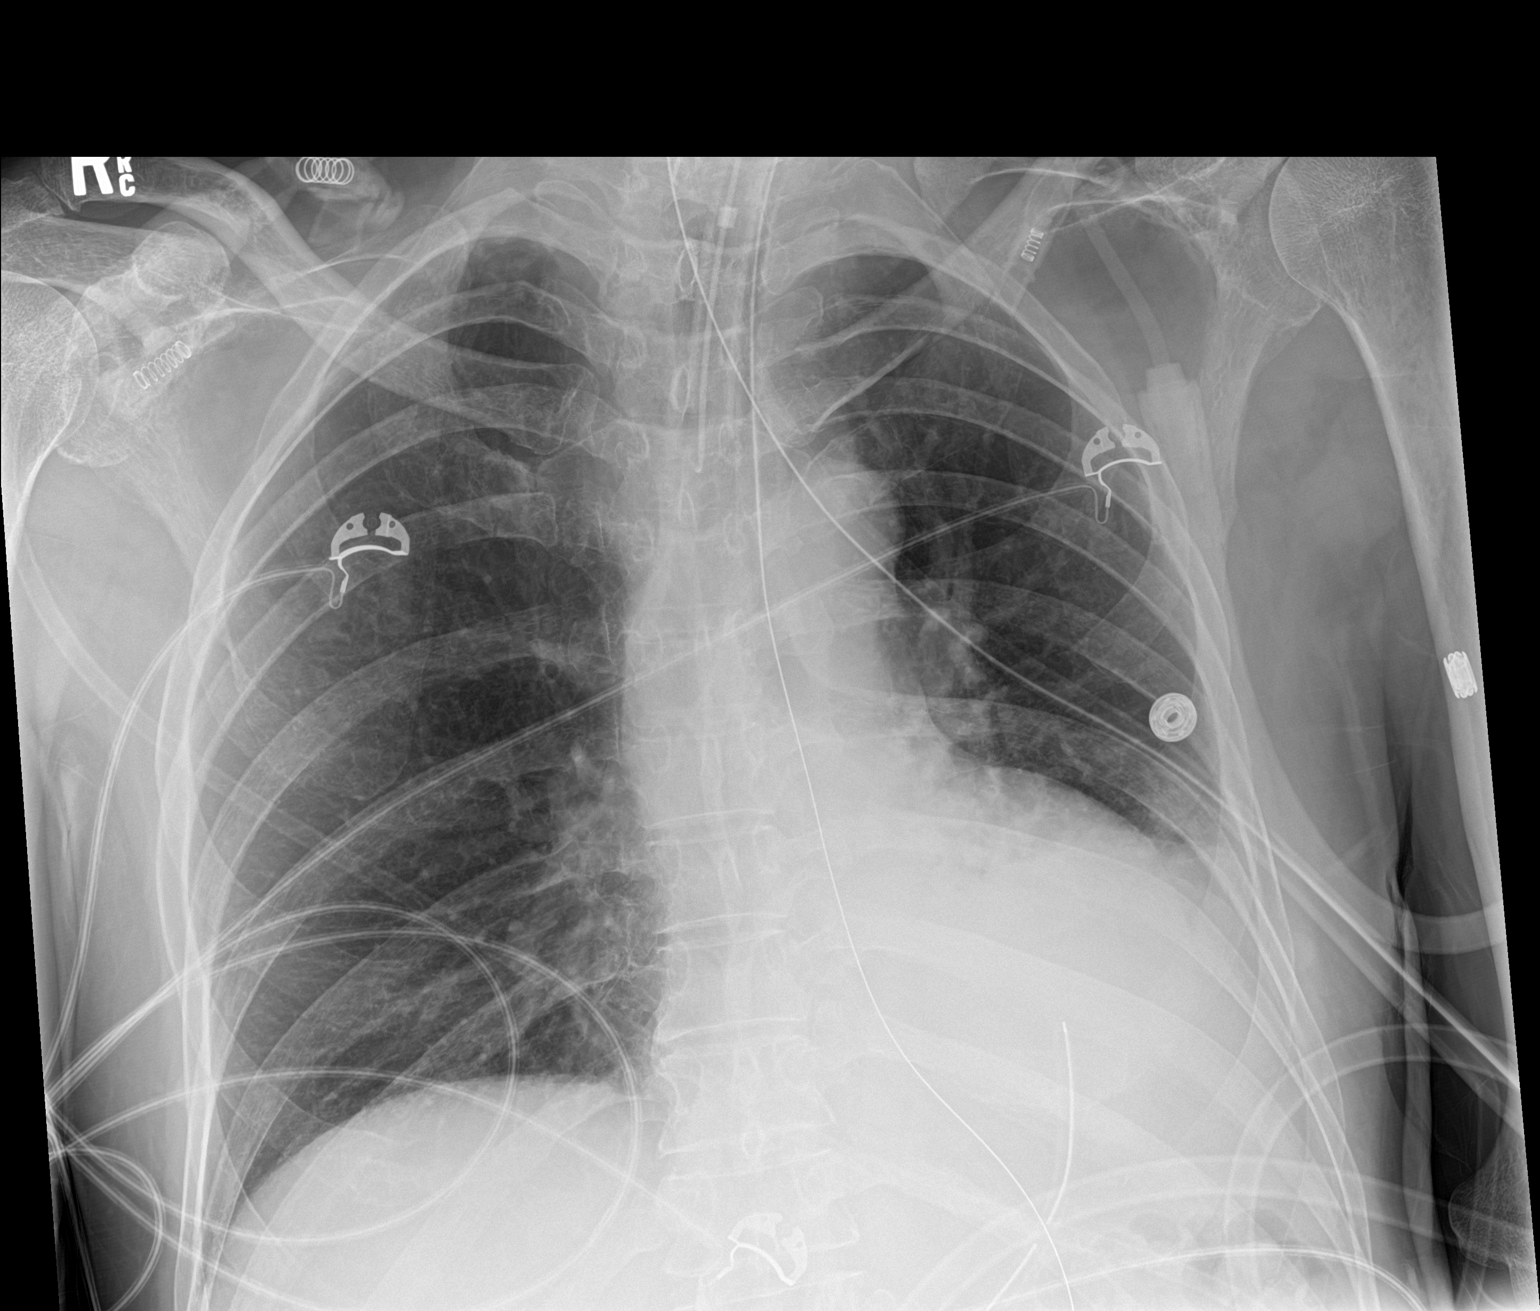

[1 of 1 positions shown; findings below may reference images not displayed]

FINDINGS: Endotracheal tube is in place with tip approximately 3.9 centimeters
above the carina. Nasogastric tube is in place, tip coiled back upon
itself into the gastric fundal region. There is volume loss at the
LEFT lung base, obscuring the LEFT hemidiaphragm. RIGHT lung is
clear. No pulmonary edema.
IMPRESSION: Persistent significant atelectasis and/or consolidation of the LEFT
LOWER lobe.

Endotracheal tube and nasogastric tube in good position.

No edema.

## 2019-12-12 IMAGING — MR MR HEAD W/O CM
10 series · 46 of 48 positions shown · non-contrast
Comparison: CT 11/29/2017

CLINICAL DATA: Poor appetite. Dehydration. Weakness. Weight loss.

EXAM:
MRI HEAD WITHOUT CONTRAST
TECHNIQUE: Multiplanar, multiecho pulse sequences of the brain and surrounding
structures were obtained without intravenous contrast.

[Series 2: GRE · sagittal · 5.0mm · 0.45mm/px · 3 of 27 slices shown (1 of 2)]
[im 1/27]
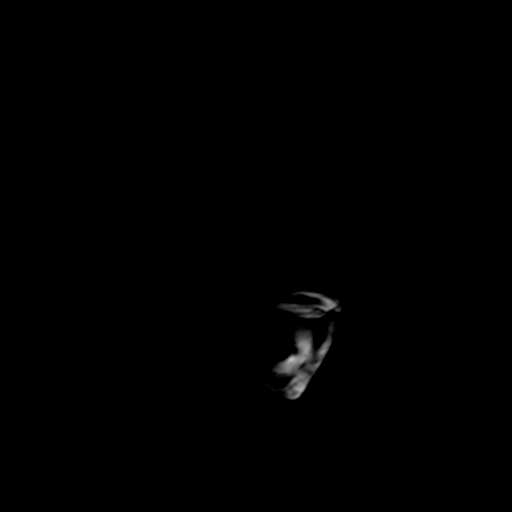
[im 14/27]
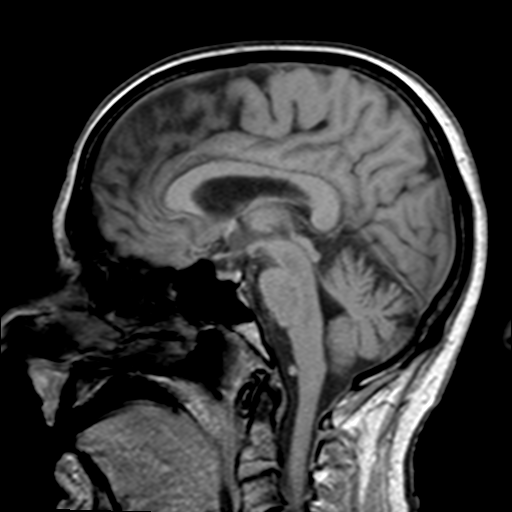
[im 27/27]
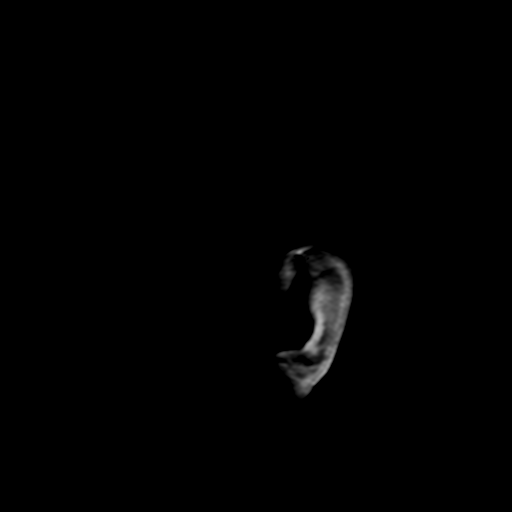

[Series 4: DWI · axial · 3.0mm · 1.80mm/px · z∈[-54,+107]mm · 7 of 55 slices shown (1 of 2)]
[im 1/55]
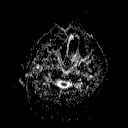
[im 10/55]
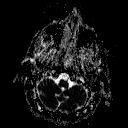
[im 19/55]
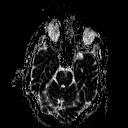
[im 28/55]
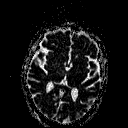
[im 37/55]
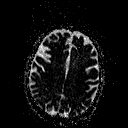
[im 46/55]
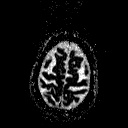
[im 55/55]
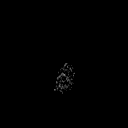

[Series 6: DWI · coronal · 3.0mm · 1.80mm/px · 6 of 49 slices shown (2 of 2)]
[im 1/49]
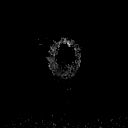
[im 10/49]
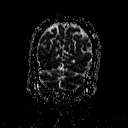
[im 20/49]
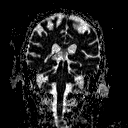
[im 29/49]
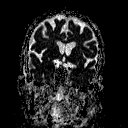
[im 39/49]
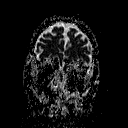
[im 49/49]
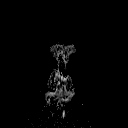

[Series 7: T2 · axial · 5.0mm · 0.45mm/px · z∈[-57,+104]mm · 3 of 26 slices shown (1 of 3)]
[im 1/26]
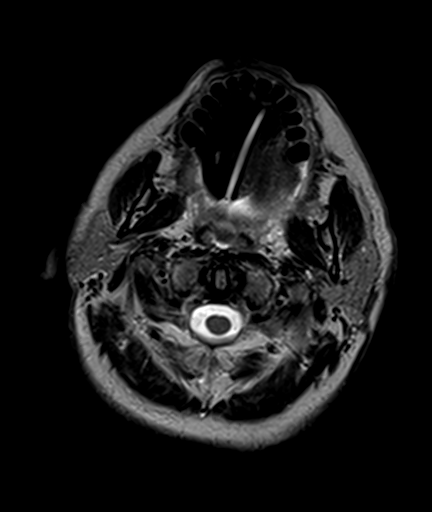
[im 13/26]
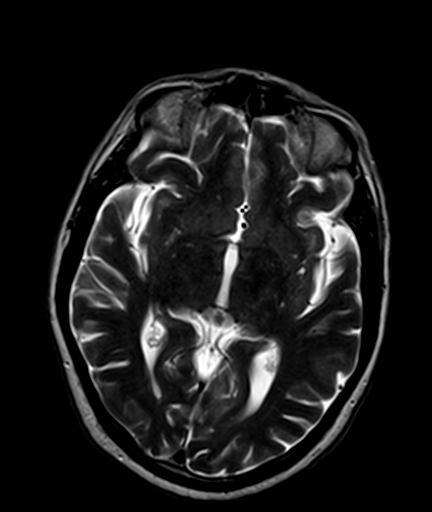
[im 26/26]
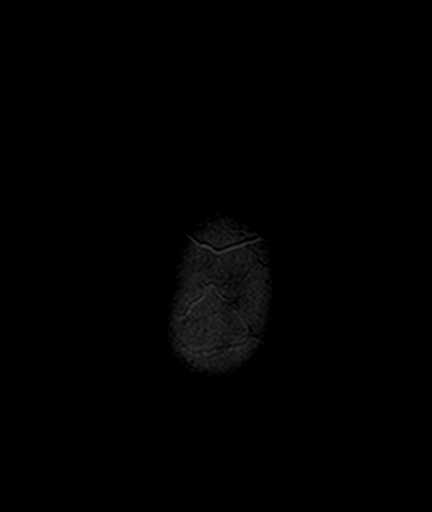

[Series 8: FLAIR · axial · 3.0mm · 0.45mm/px · z∈[-54,+101]mm · 6 of 53 slices shown]
[im 1/53]
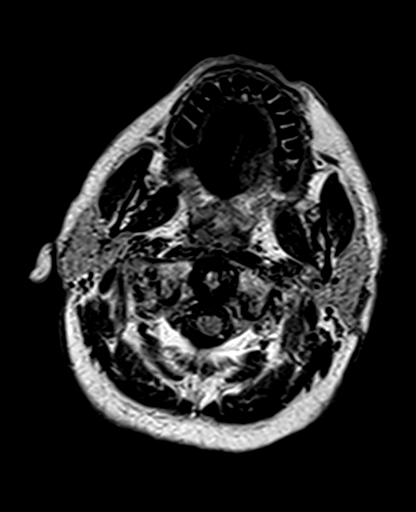
[im 11/53]
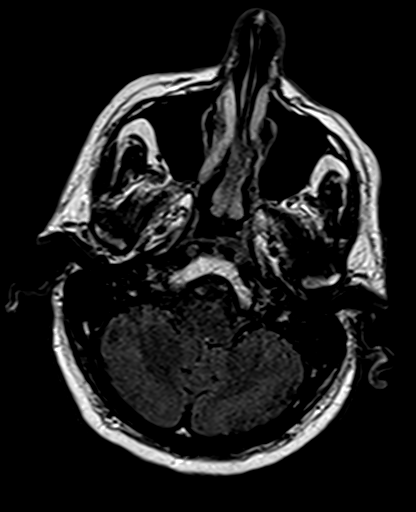
[im 21/53]
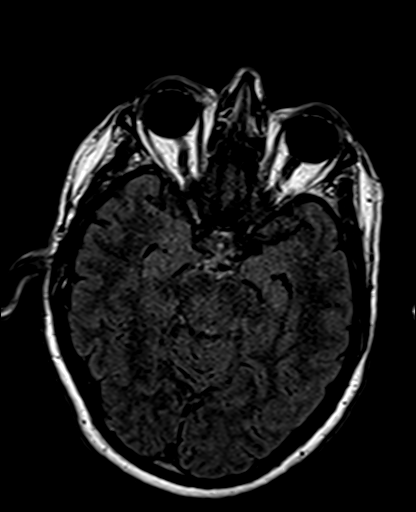
[im 32/53]
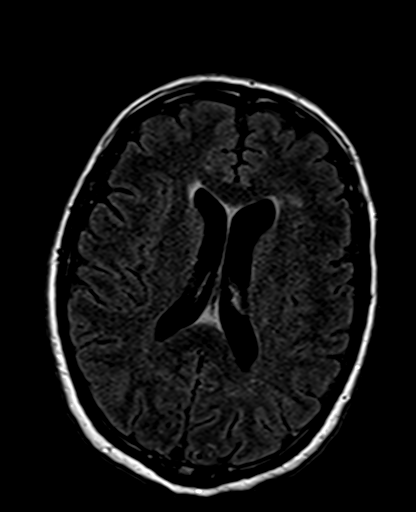
[im 42/53]
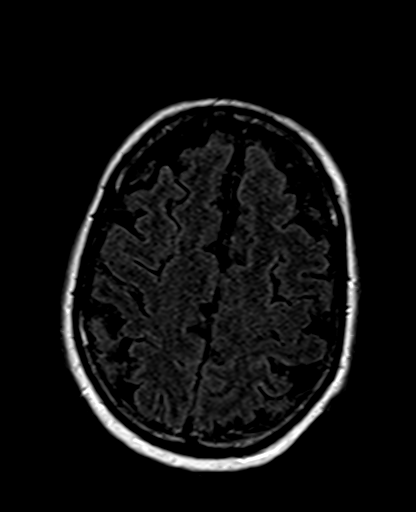
[im 53/53]
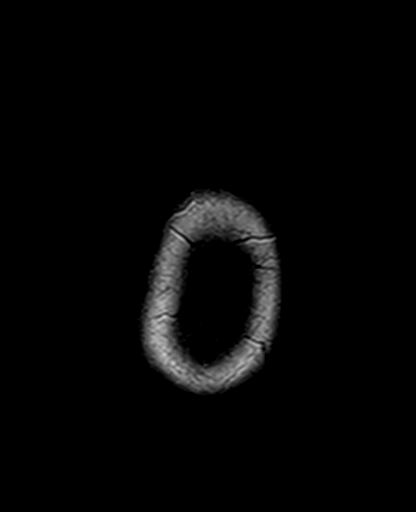

[Series 9: T2 · axial · 5.0mm · 1.20mm/px · z∈[-55,+106]mm · 3 of 26 slices shown (2 of 3)]
[im 1/26]
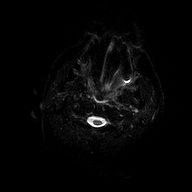
[im 13/26]
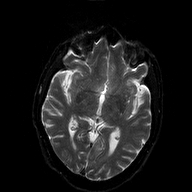
[im 26/26]
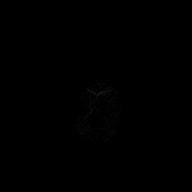

[Series 10: GRE · axial · 5.0mm · 0.45mm/px · z∈[-55,+106]mm · 3 of 26 slices shown (2 of 2)]
[im 1/26]
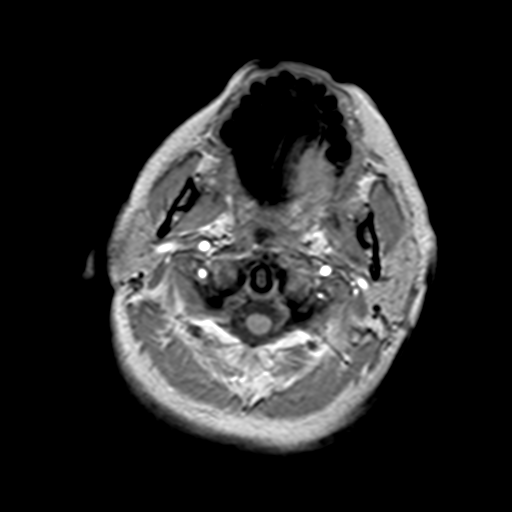
[im 13/26]
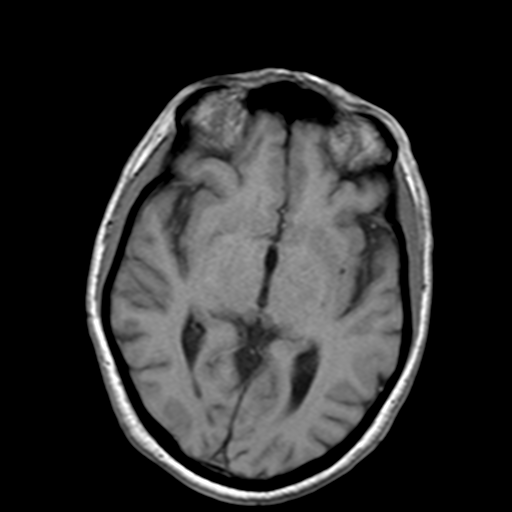
[im 26/26]
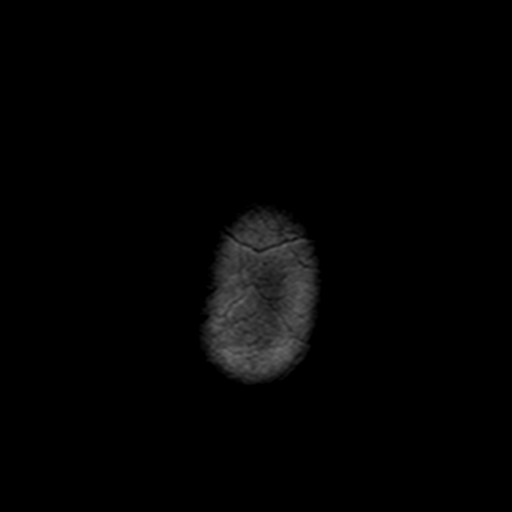

[Series 11: T2 · coronal · 5.0mm · 0.43mm/px · 4 of 29 slices shown (3 of 3)]
[im 1/29]
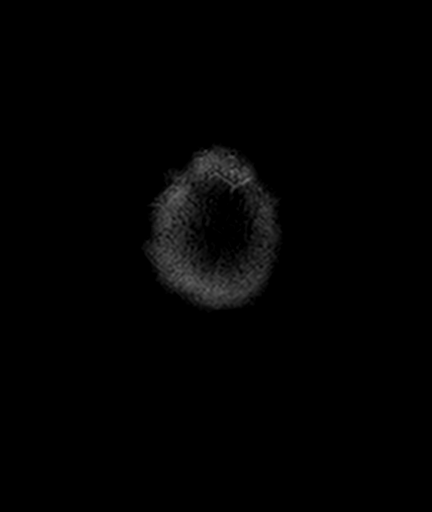
[im 10/29]
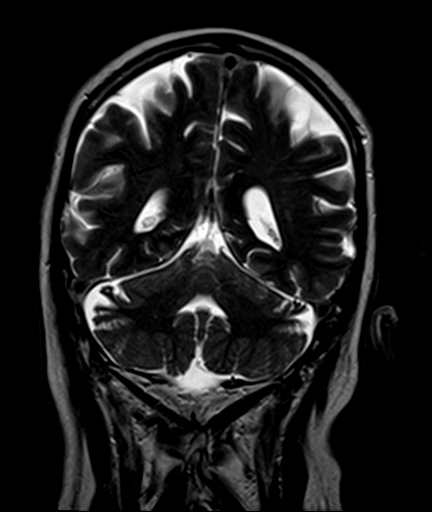
[im 19/29]
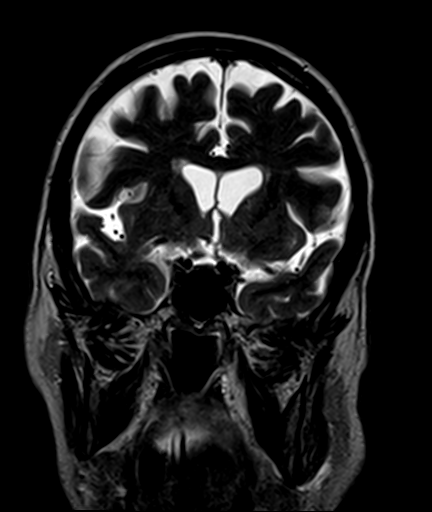
[im 29/29]
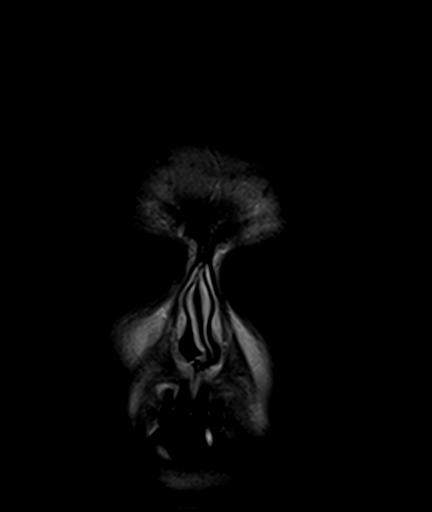

[Series 100: ax (id) · axial · 3.0mm · 1.80mm/px · z∈[-54,+107]mm · 7 of 55 slices shown]
[im 1/55]
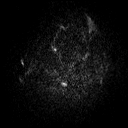
[im 10/55]
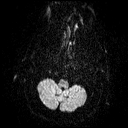
[im 19/55]
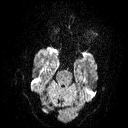
[im 28/55]
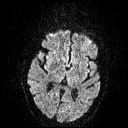
[im 37/55]
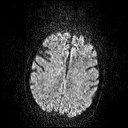
[im 46/55]
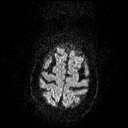
[im 55/55]
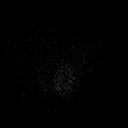

[Series 101: cor (id) · coronal · 3.0mm · 1.80mm/px · 4 of 49 slices shown]
[im 1/49]
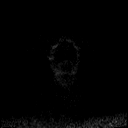
[im 10/49]
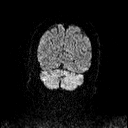
[im 20/49]
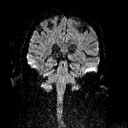
[im 29/49]
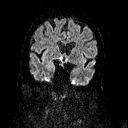

[46 of 48 positions shown; findings below may reference images not displayed]

FINDINGS: Brain: Mild age related volume loss. Mild small vessel change of the
pons and cerebral hemispheric white matter. No evidence of acute or
subacute infarction. No mass lesion, hemorrhage, hydrocephalus or
extra-axial collection.

Vascular: Major vessels at the base of the brain show flow.

Skull and upper cervical spine: Negative

Sinuses/Orbits: Clear/normal

Other: None
IMPRESSION: No acute or reversible finding. No cause of the presenting symptoms
is identified. Mild age related volume loss and chronic small-vessel
change of the pons and hemispheric white matter.

## 2019-12-16 IMAGING — DX DG CHEST 1V PORT
1 series · 1 of 1 positions shown · non-contrast
Comparison: Chest radiograph performed 12/01/2017

CLINICAL DATA: Acute onset of respiratory failure.

EXAM:
PORTABLE CHEST 1 VIEW

[chest ap]
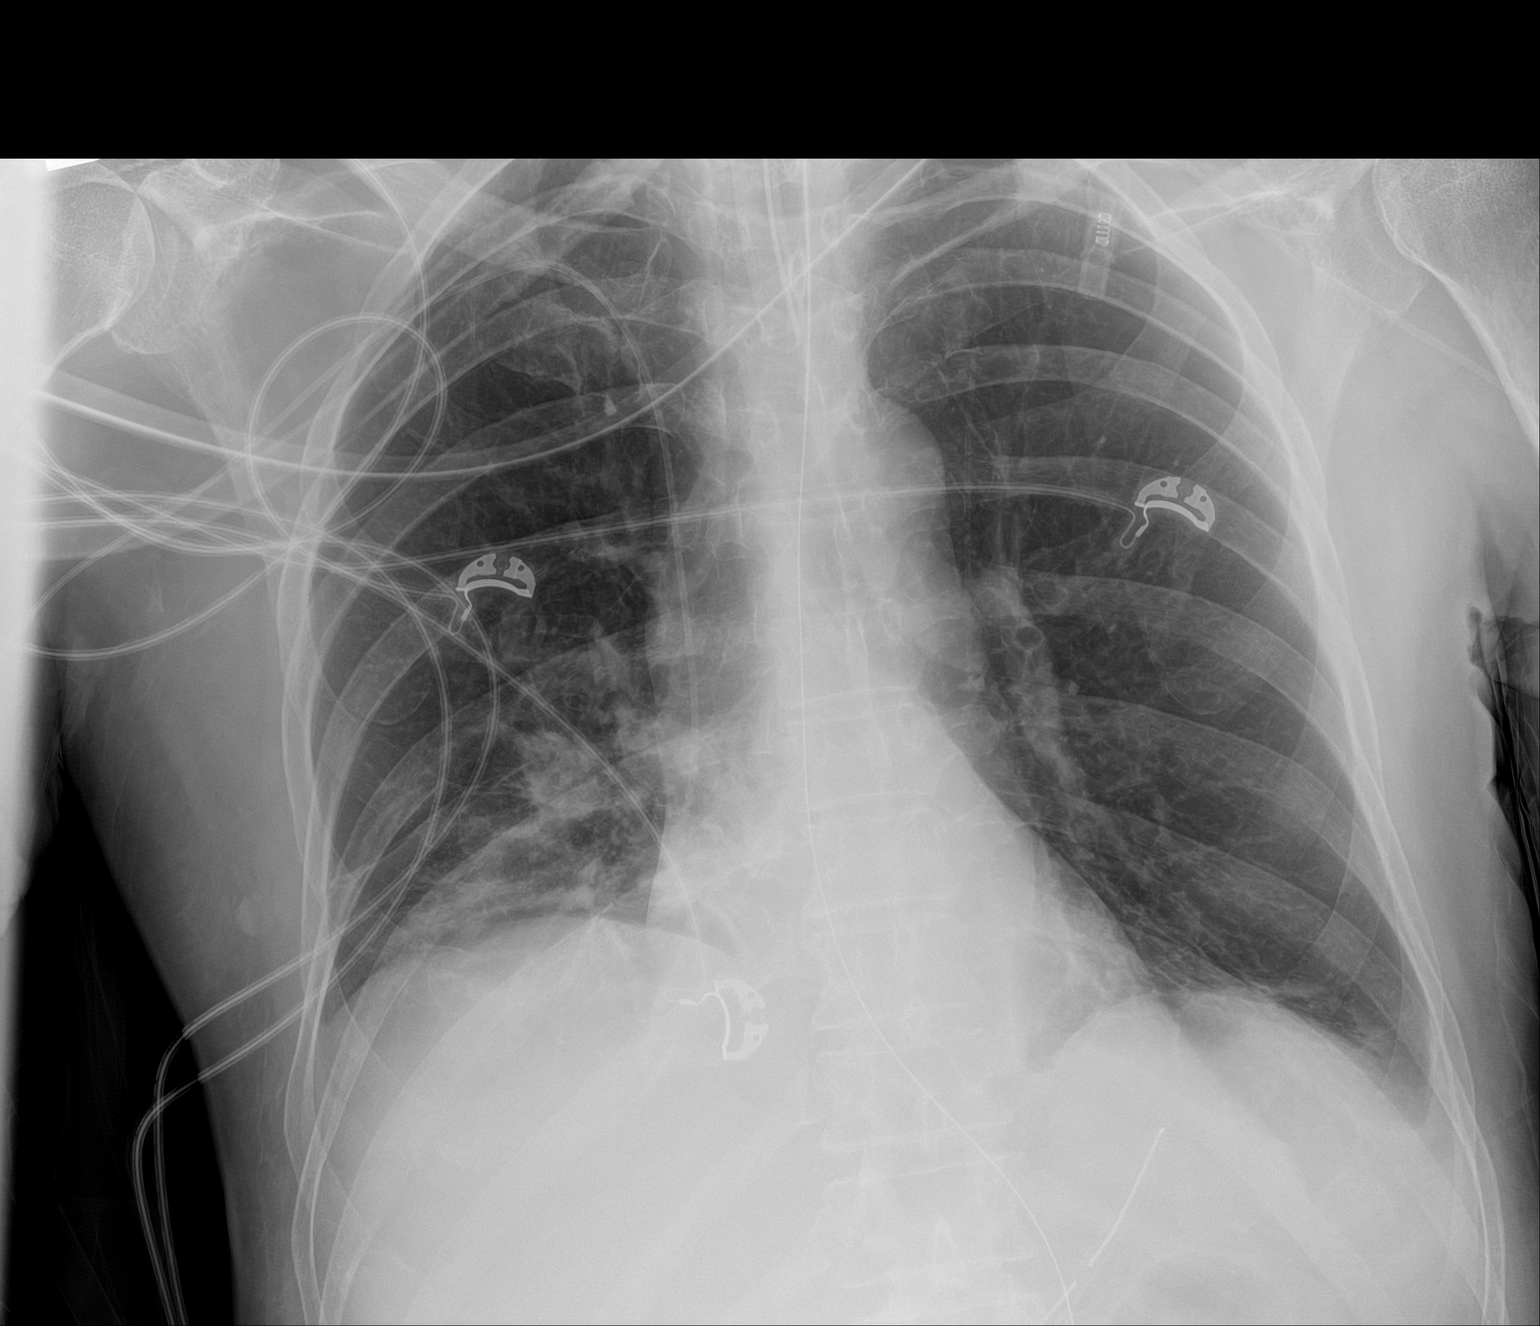

[1 of 1 positions shown; findings below may reference images not displayed]

FINDINGS: The patient's endotracheal tube is seen ending 6 cm above the
carina. An enteric tube is noted extending below the diaphragm. A
right PICC is noted ending about the distal SVC.

Bibasilar airspace opacities, right greater than left, may reflect
pneumonia. A small left pleural effusion is noted. No pneumothorax
is seen. An apparent calcified granuloma is noted at the right
midlung zone.

The cardiomediastinal silhouette is normal in size. No acute osseous
abnormalities are identified.
IMPRESSION: 1. Endotracheal tube seen ending 6 cm above the carina.
2. Bibasilar airspace opacities, right greater than left, may
reflect pneumonia. Small left pleural effusion noted.

## 2019-12-17 IMAGING — DX DG ABD PORTABLE 1V
1 series · 1 of 1 positions shown · non-contrast
Comparison: Abdominal radiograph performed earlier today at [DATE]
p.m.

CLINICAL DATA: Nasogastric tube placement.

EXAM:
PORTABLE ABDOMEN - 1 VIEW

[abdomen supine]
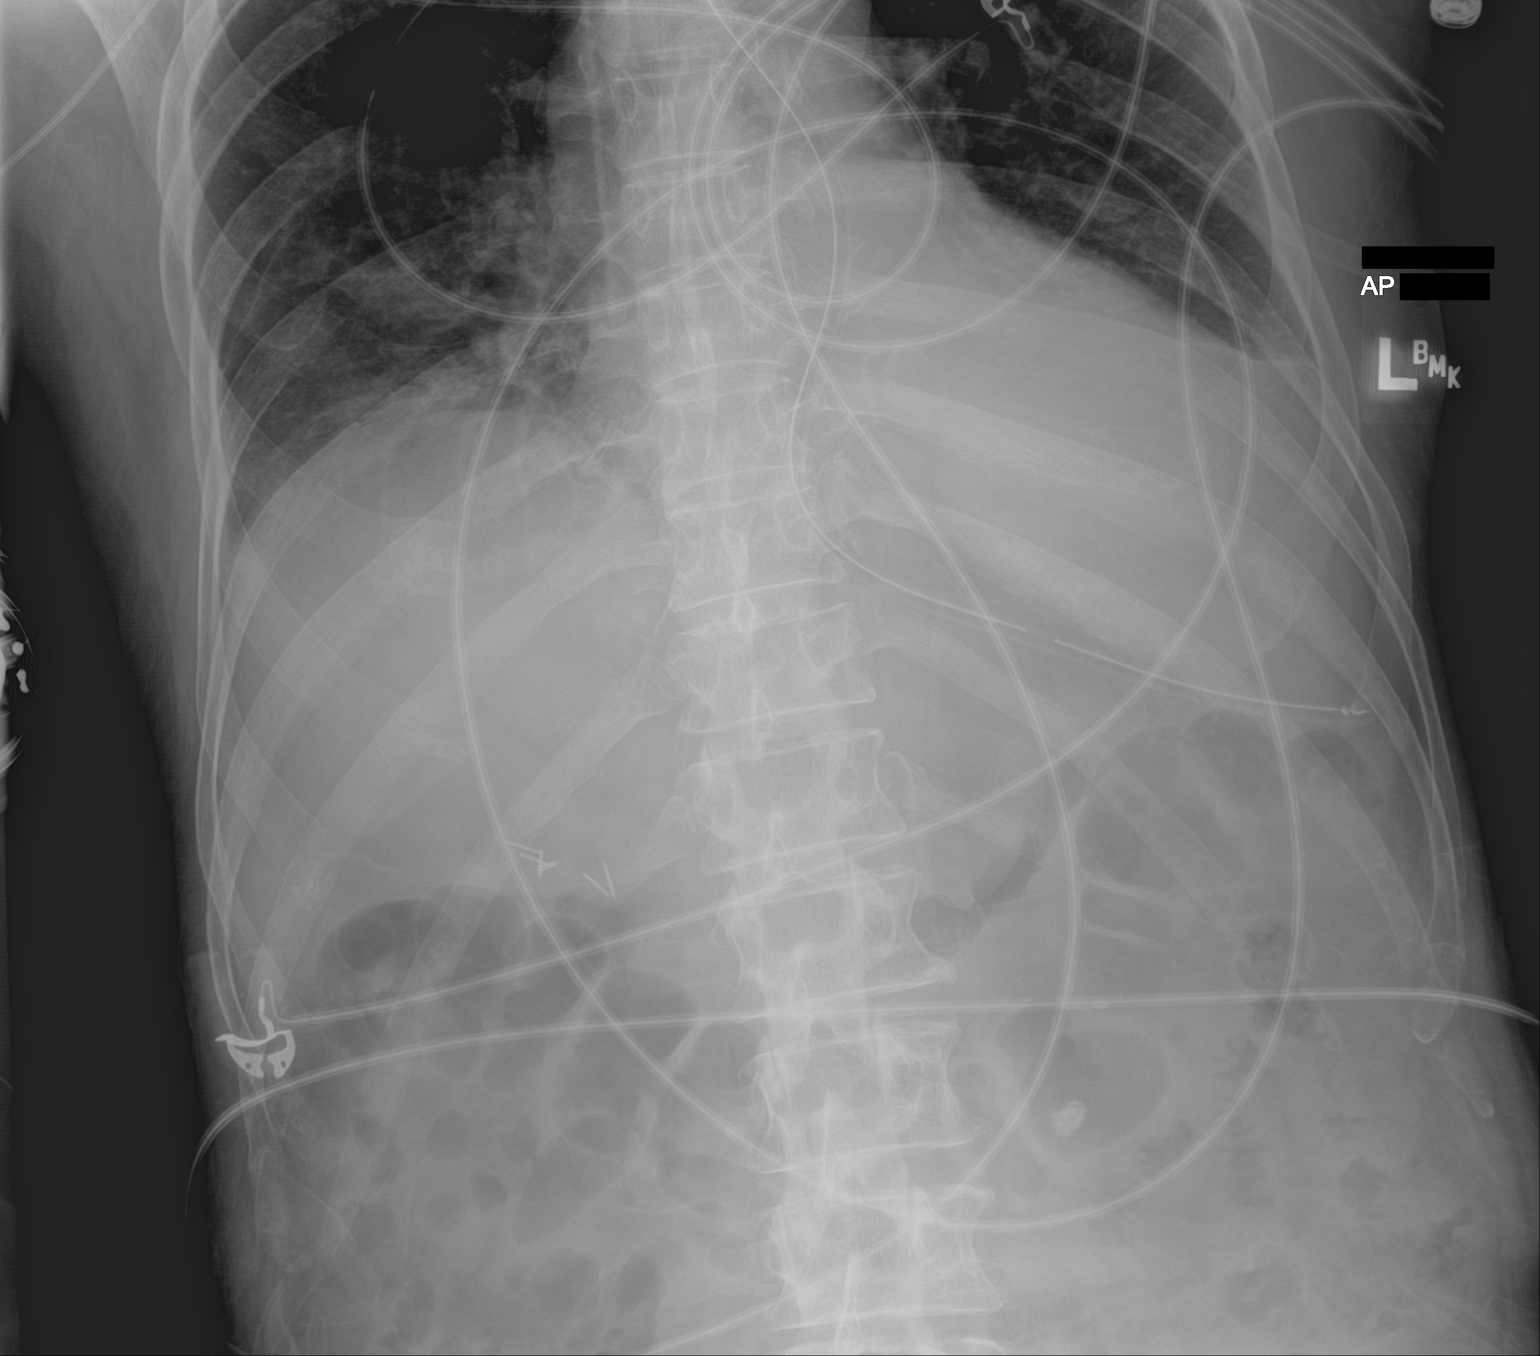

[1 of 1 positions shown; findings below may reference images not displayed]

FINDINGS: The patient's enteric tube is noted ending overlying the body of the
stomach.

The visualized bowel gas pattern is grossly unremarkable. Clips are
noted within the right upper quadrant, reflecting prior
cholecystectomy. A small left pleural effusion is noted. No acute
osseous abnormalities are identified.
IMPRESSION: Enteric tube noted ending overlying the body of the stomach.
